# Patient Record
Sex: Male | Born: 1945 | Race: Black or African American | Hispanic: No | Marital: Married | Smoking: Current every day smoker
Health system: Southern US, Community
[De-identification: ages and names within clinical notes are randomized; demographics above are authoritative.]

## PROBLEM LIST (undated history)

## (undated) DIAGNOSIS — N410 Acute prostatitis: Secondary | ICD-10-CM

## (undated) DIAGNOSIS — I429 Cardiomyopathy, unspecified: Secondary | ICD-10-CM

## (undated) DIAGNOSIS — M519 Unspecified thoracic, thoracolumbar and lumbosacral intervertebral disc disorder: Secondary | ICD-10-CM

## (undated) DIAGNOSIS — D126 Benign neoplasm of colon, unspecified: Secondary | ICD-10-CM

## (undated) DIAGNOSIS — R0602 Shortness of breath: Secondary | ICD-10-CM

## (undated) DIAGNOSIS — J449 Chronic obstructive pulmonary disease, unspecified: Secondary | ICD-10-CM

## (undated) DIAGNOSIS — IMO0002 Reserved for concepts with insufficient information to code with codable children: Secondary | ICD-10-CM

## (undated) DIAGNOSIS — Z87448 Personal history of other diseases of urinary system: Secondary | ICD-10-CM

## (undated) DIAGNOSIS — R351 Nocturia: Secondary | ICD-10-CM

## (undated) DIAGNOSIS — J441 Chronic obstructive pulmonary disease with (acute) exacerbation: Secondary | ICD-10-CM

## (undated) DIAGNOSIS — R0609 Other forms of dyspnea: Secondary | ICD-10-CM

## (undated) DIAGNOSIS — R229 Localized swelling, mass and lump, unspecified: Secondary | ICD-10-CM

## (undated) DIAGNOSIS — R0989 Other specified symptoms and signs involving the circulatory and respiratory systems: Secondary | ICD-10-CM

## (undated) DIAGNOSIS — M79609 Pain in unspecified limb: Secondary | ICD-10-CM

## (undated) DIAGNOSIS — J8410 Pulmonary fibrosis, unspecified: Secondary | ICD-10-CM

## (undated) DIAGNOSIS — R5381 Other malaise: Secondary | ICD-10-CM

## (undated) DIAGNOSIS — L989 Disorder of the skin and subcutaneous tissue, unspecified: Secondary | ICD-10-CM

## (undated) DIAGNOSIS — I10 Essential (primary) hypertension: Secondary | ICD-10-CM

## (undated) DIAGNOSIS — K5730 Diverticulosis of large intestine without perforation or abscess without bleeding: Secondary | ICD-10-CM

## (undated) DIAGNOSIS — N400 Enlarged prostate without lower urinary tract symptoms: Secondary | ICD-10-CM

## (undated) DIAGNOSIS — F17200 Nicotine dependence, unspecified, uncomplicated: Secondary | ICD-10-CM

## (undated) DIAGNOSIS — J4489 Other specified chronic obstructive pulmonary disease: Secondary | ICD-10-CM

## (undated) DIAGNOSIS — R252 Cramp and spasm: Secondary | ICD-10-CM

## (undated) DIAGNOSIS — K222 Esophageal obstruction: Secondary | ICD-10-CM

## (undated) DIAGNOSIS — R972 Elevated prostate specific antigen [PSA]: Secondary | ICD-10-CM

## (undated) DIAGNOSIS — R55 Syncope and collapse: Secondary | ICD-10-CM

## (undated) DIAGNOSIS — N453 Epididymo-orchitis: Secondary | ICD-10-CM

## (undated) DIAGNOSIS — R5383 Other fatigue: Secondary | ICD-10-CM

## (undated) DIAGNOSIS — E785 Hyperlipidemia, unspecified: Secondary | ICD-10-CM

## (undated) DIAGNOSIS — R9431 Abnormal electrocardiogram [ECG] [EKG]: Secondary | ICD-10-CM

## (undated) DIAGNOSIS — H538 Other visual disturbances: Secondary | ICD-10-CM

## (undated) DIAGNOSIS — R062 Wheezing: Secondary | ICD-10-CM

## (undated) DIAGNOSIS — D496 Neoplasm of unspecified behavior of brain: Secondary | ICD-10-CM

## (undated) DIAGNOSIS — J309 Allergic rhinitis, unspecified: Secondary | ICD-10-CM

## (undated) DIAGNOSIS — B370 Candidal stomatitis: Secondary | ICD-10-CM

## (undated) DIAGNOSIS — E119 Type 2 diabetes mellitus without complications: Secondary | ICD-10-CM

## (undated) DIAGNOSIS — K219 Gastro-esophageal reflux disease without esophagitis: Secondary | ICD-10-CM

## (undated) HISTORY — DX: Pulmonary fibrosis, unspecified: J84.10

## (undated) HISTORY — DX: Elevated prostate specific antigen (PSA): R97.20

## (undated) HISTORY — DX: Other fatigue: R53.83

## (undated) HISTORY — DX: Essential (primary) hypertension: I10

## (undated) HISTORY — DX: Shortness of breath: R06.02

## (undated) HISTORY — DX: Diverticulosis of large intestine without perforation or abscess without bleeding: K57.30

## (undated) HISTORY — DX: Cramp and spasm: R25.2

## (undated) HISTORY — DX: Benign prostatic hyperplasia without lower urinary tract symptoms: N40.0

## (undated) HISTORY — DX: Reserved for concepts with insufficient information to code with codable children: IMO0002

## (undated) HISTORY — DX: Localized swelling, mass and lump, unspecified: R22.9

## (undated) HISTORY — DX: Disorder of the skin and subcutaneous tissue, unspecified: L98.9

## (undated) HISTORY — DX: Syncope and collapse: R55

## (undated) HISTORY — DX: Unspecified thoracic, thoracolumbar and lumbosacral intervertebral disc disorder: M51.9

## (undated) HISTORY — DX: Hyperlipidemia, unspecified: E78.5

## (undated) HISTORY — DX: Personal history of other diseases of urinary system: Z87.448

## (undated) HISTORY — DX: Allergic rhinitis, unspecified: J30.9

## (undated) HISTORY — DX: Type 2 diabetes mellitus without complications: E11.9

## (undated) HISTORY — DX: Other malaise: R53.81

## (undated) HISTORY — PX: ELBOW ARTHROSCOPY: SHX614

## (undated) HISTORY — DX: Other visual disturbances: H53.8

## (undated) HISTORY — DX: Chronic obstructive pulmonary disease, unspecified: J44.9

## (undated) HISTORY — DX: Benign neoplasm of colon, unspecified: D12.6

## (undated) HISTORY — DX: Other specified chronic obstructive pulmonary disease: J44.89

## (undated) HISTORY — DX: Chronic obstructive pulmonary disease with (acute) exacerbation: J44.1

## (undated) HISTORY — PX: BACK SURGERY: SHX140

## (undated) HISTORY — DX: Acute prostatitis: N41.0

## (undated) HISTORY — DX: Esophageal obstruction: K22.2

## (undated) HISTORY — DX: Wheezing: R06.2

## (undated) HISTORY — DX: Pain in unspecified limb: M79.609

## (undated) HISTORY — DX: Abnormal electrocardiogram (ECG) (EKG): R94.31

## (undated) HISTORY — DX: Other specified symptoms and signs involving the circulatory and respiratory systems: R06.09

## (undated) HISTORY — DX: Other specified symptoms and signs involving the circulatory and respiratory systems: R09.89

## (undated) HISTORY — DX: Epididymo-orchitis: N45.3

## (undated) HISTORY — DX: Neoplasm of unspecified behavior of brain: D49.6

## (undated) HISTORY — DX: Nocturia: R35.1

## (undated) HISTORY — DX: Nicotine dependence, unspecified, uncomplicated: F17.200

## (undated) HISTORY — DX: Gastro-esophageal reflux disease without esophagitis: K21.9

## (undated) HISTORY — DX: Candidal stomatitis: B37.0

## (undated) HISTORY — DX: Cardiomyopathy, unspecified: I42.9

---

## 1999-10-06 ENCOUNTER — Emergency Department (HOSPITAL_COMMUNITY): Admission: EM | Admit: 1999-10-06 | Discharge: 1999-10-06 | Payer: Self-pay | Admitting: Emergency Medicine

## 1999-10-06 ENCOUNTER — Encounter: Payer: Self-pay | Admitting: Emergency Medicine

## 1999-10-08 ENCOUNTER — Encounter: Payer: Self-pay | Admitting: Occupational Medicine

## 1999-10-08 ENCOUNTER — Ambulatory Visit: Admission: RE | Admit: 1999-10-08 | Discharge: 1999-10-08 | Payer: Self-pay | Admitting: Occupational Medicine

## 2001-08-09 ENCOUNTER — Emergency Department (HOSPITAL_COMMUNITY): Admission: EM | Admit: 2001-08-09 | Discharge: 2001-08-09 | Payer: Self-pay | Admitting: *Deleted

## 2002-03-10 ENCOUNTER — Encounter: Admission: RE | Admit: 2002-03-10 | Discharge: 2002-03-10 | Payer: Self-pay | Admitting: *Deleted

## 2002-03-10 ENCOUNTER — Encounter: Payer: Self-pay | Admitting: *Deleted

## 2002-03-24 ENCOUNTER — Encounter: Payer: Self-pay | Admitting: *Deleted

## 2002-03-24 ENCOUNTER — Encounter: Admission: RE | Admit: 2002-03-24 | Discharge: 2002-03-24 | Payer: Self-pay | Admitting: *Deleted

## 2002-04-06 ENCOUNTER — Encounter: Payer: Self-pay | Admitting: *Deleted

## 2002-04-06 ENCOUNTER — Encounter: Admission: RE | Admit: 2002-04-06 | Discharge: 2002-04-06 | Payer: Self-pay | Admitting: *Deleted

## 2002-06-10 ENCOUNTER — Inpatient Hospital Stay (HOSPITAL_COMMUNITY): Admission: RE | Admit: 2002-06-10 | Discharge: 2002-06-11 | Payer: Self-pay | Admitting: Orthopaedic Surgery

## 2004-05-28 ENCOUNTER — Ambulatory Visit (HOSPITAL_COMMUNITY): Admission: RE | Admit: 2004-05-28 | Discharge: 2004-05-28 | Payer: Self-pay | Admitting: Internal Medicine

## 2004-08-30 ENCOUNTER — Ambulatory Visit: Payer: Self-pay | Admitting: *Deleted

## 2004-09-02 ENCOUNTER — Ambulatory Visit: Payer: Self-pay | Admitting: Family Medicine

## 2004-12-03 ENCOUNTER — Ambulatory Visit: Payer: Self-pay | Admitting: Family Medicine

## 2005-04-17 ENCOUNTER — Ambulatory Visit: Payer: Self-pay | Admitting: Internal Medicine

## 2005-04-25 ENCOUNTER — Ambulatory Visit: Admission: RE | Admit: 2005-04-25 | Discharge: 2005-04-25 | Payer: Self-pay | Admitting: Internal Medicine

## 2005-05-06 ENCOUNTER — Ambulatory Visit: Payer: Self-pay | Admitting: Internal Medicine

## 2005-05-28 ENCOUNTER — Ambulatory Visit: Payer: Self-pay | Admitting: Internal Medicine

## 2005-08-12 ENCOUNTER — Ambulatory Visit: Payer: Self-pay | Admitting: Internal Medicine

## 2006-02-25 ENCOUNTER — Ambulatory Visit: Payer: Self-pay | Admitting: Internal Medicine

## 2006-03-18 ENCOUNTER — Ambulatory Visit: Payer: Self-pay | Admitting: Internal Medicine

## 2006-04-01 ENCOUNTER — Ambulatory Visit: Payer: Self-pay | Admitting: Internal Medicine

## 2006-04-01 ENCOUNTER — Ambulatory Visit: Payer: Self-pay

## 2006-04-01 ENCOUNTER — Encounter: Payer: Self-pay | Admitting: Cardiology

## 2006-04-15 ENCOUNTER — Ambulatory Visit: Payer: Self-pay | Admitting: Internal Medicine

## 2006-05-12 ENCOUNTER — Emergency Department (HOSPITAL_COMMUNITY): Admission: EM | Admit: 2006-05-12 | Discharge: 2006-05-12 | Payer: Self-pay | Admitting: Emergency Medicine

## 2006-05-13 ENCOUNTER — Ambulatory Visit: Payer: Self-pay | Admitting: Internal Medicine

## 2006-05-20 ENCOUNTER — Ambulatory Visit: Payer: Self-pay | Admitting: Internal Medicine

## 2006-06-05 ENCOUNTER — Ambulatory Visit: Payer: Self-pay | Admitting: Internal Medicine

## 2006-06-11 ENCOUNTER — Ambulatory Visit: Payer: Self-pay | Admitting: Internal Medicine

## 2006-07-01 ENCOUNTER — Ambulatory Visit (HOSPITAL_COMMUNITY): Admission: RE | Admit: 2006-07-01 | Discharge: 2006-07-01 | Payer: Self-pay | Admitting: Internal Medicine

## 2006-07-06 ENCOUNTER — Ambulatory Visit (HOSPITAL_COMMUNITY): Admission: RE | Admit: 2006-07-06 | Discharge: 2006-07-06 | Payer: Self-pay | Admitting: Internal Medicine

## 2006-07-27 ENCOUNTER — Ambulatory Visit: Payer: Self-pay

## 2006-12-09 ENCOUNTER — Ambulatory Visit: Payer: Self-pay | Admitting: Internal Medicine

## 2007-02-03 ENCOUNTER — Ambulatory Visit: Payer: Self-pay | Admitting: Internal Medicine

## 2007-03-10 ENCOUNTER — Emergency Department (HOSPITAL_COMMUNITY): Admission: EM | Admit: 2007-03-10 | Discharge: 2007-03-10 | Payer: Self-pay | Admitting: Emergency Medicine

## 2007-03-17 ENCOUNTER — Ambulatory Visit: Payer: Self-pay | Admitting: Internal Medicine

## 2007-05-06 ENCOUNTER — Ambulatory Visit: Payer: Self-pay | Admitting: Internal Medicine

## 2007-05-06 LAB — CONVERTED CEMR LAB
Albumin: 3.8 g/dL (ref 3.5–5.2)
Basophils Absolute: 0.2 10*3/uL — ABNORMAL HIGH (ref 0.0–0.1)
Basophils Relative: 1.5 % — ABNORMAL HIGH (ref 0.0–1.0)
Bilirubin, Direct: 0.1 mg/dL (ref 0.0–0.3)
CO2: 30 meq/L (ref 19–32)
Chloride: 100 meq/L (ref 96–112)
Cholesterol: 151 mg/dL (ref 0–200)
Direct LDL: 81.4 mg/dL
Eosinophils Absolute: 0.7 10*3/uL — ABNORMAL HIGH (ref 0.0–0.6)
Glucose, Bld: 131 mg/dL — ABNORMAL HIGH (ref 70–99)
HDL: 42.6 mg/dL (ref >39.0)
MCV: 83.1 fL (ref 78.0–100.0)
Monocytes Relative: 12.9 % — ABNORMAL HIGH (ref 3.0–11.0)
Neutro Abs: 6.3 10*3/uL (ref 1.4–7.7)
Neutrophils Relative %: 60.8 % (ref 43.0–77.0)
PSA: 1.66 ng/mL (ref 0.10–4.00)
RBC: 5.2 M/uL (ref 4.22–5.81)
RDW: 14 % (ref 11.5–14.6)
Total CHOL/HDL Ratio: 3.5
Triglycerides: 208 mg/dL (ref 0–149)
VLDL: 42 mg/dL — ABNORMAL HIGH (ref 0–40)

## 2007-05-12 ENCOUNTER — Ambulatory Visit: Payer: Self-pay | Admitting: Internal Medicine

## 2007-06-09 ENCOUNTER — Ambulatory Visit: Payer: Self-pay | Admitting: Internal Medicine

## 2007-06-22 ENCOUNTER — Ambulatory Visit: Payer: Self-pay | Admitting: Internal Medicine

## 2007-06-22 LAB — CONVERTED CEMR LAB
BUN: 11 mg/dL (ref 6–23)
CO2: 29 meq/L (ref 19–32)
Calcium: 9 mg/dL (ref 8.4–10.5)
Chloride: 109 meq/L (ref 96–112)
Creatinine, Ser: 1.1 mg/dL (ref 0.4–1.5)
GFR calc Af Amer: 87 mL/min
GFR calc non Af Amer: 72 mL/min
Glucose, Bld: 94 mg/dL (ref 70–99)
Hgb A1c MFr Bld: 6.7 % — ABNORMAL HIGH (ref 4.6–6.0)
Sodium: 141 meq/L (ref 135–145)

## 2007-07-13 ENCOUNTER — Ambulatory Visit: Payer: Self-pay | Admitting: Pulmonary Disease

## 2007-07-15 ENCOUNTER — Ambulatory Visit: Payer: Self-pay | Admitting: Internal Medicine

## 2007-08-16 ENCOUNTER — Ambulatory Visit: Payer: Self-pay | Admitting: Internal Medicine

## 2007-08-19 ENCOUNTER — Ambulatory Visit: Payer: Self-pay | Admitting: Internal Medicine

## 2007-08-23 LAB — HM COLONOSCOPY

## 2007-08-31 ENCOUNTER — Encounter: Payer: Self-pay | Admitting: Internal Medicine

## 2007-08-31 ENCOUNTER — Ambulatory Visit (HOSPITAL_COMMUNITY): Admission: RE | Admit: 2007-08-31 | Discharge: 2007-08-31 | Payer: Self-pay | Admitting: Internal Medicine

## 2007-08-31 DIAGNOSIS — D126 Benign neoplasm of colon, unspecified: Secondary | ICD-10-CM | POA: Insufficient documentation

## 2007-08-31 DIAGNOSIS — K5730 Diverticulosis of large intestine without perforation or abscess without bleeding: Secondary | ICD-10-CM | POA: Insufficient documentation

## 2007-09-07 ENCOUNTER — Ambulatory Visit: Payer: Self-pay | Admitting: Internal Medicine

## 2007-09-28 ENCOUNTER — Ambulatory Visit: Payer: Self-pay | Admitting: Internal Medicine

## 2007-09-28 ENCOUNTER — Encounter: Payer: Self-pay | Admitting: Internal Medicine

## 2007-09-28 DIAGNOSIS — R5383 Other fatigue: Secondary | ICD-10-CM

## 2007-09-28 DIAGNOSIS — K219 Gastro-esophageal reflux disease without esophagitis: Secondary | ICD-10-CM

## 2007-09-28 DIAGNOSIS — K222 Esophageal obstruction: Secondary | ICD-10-CM

## 2007-09-28 DIAGNOSIS — R0609 Other forms of dyspnea: Secondary | ICD-10-CM

## 2007-09-28 DIAGNOSIS — J309 Allergic rhinitis, unspecified: Secondary | ICD-10-CM | POA: Insufficient documentation

## 2007-09-28 DIAGNOSIS — M519 Unspecified thoracic, thoracolumbar and lumbosacral intervertebral disc disorder: Secondary | ICD-10-CM | POA: Insufficient documentation

## 2007-09-28 DIAGNOSIS — R0989 Other specified symptoms and signs involving the circulatory and respiratory systems: Secondary | ICD-10-CM

## 2007-09-28 DIAGNOSIS — R5381 Other malaise: Secondary | ICD-10-CM | POA: Insufficient documentation

## 2007-09-28 DIAGNOSIS — E785 Hyperlipidemia, unspecified: Secondary | ICD-10-CM

## 2007-09-28 DIAGNOSIS — Z87448 Personal history of other diseases of urinary system: Secondary | ICD-10-CM

## 2007-09-28 DIAGNOSIS — I10 Essential (primary) hypertension: Secondary | ICD-10-CM | POA: Insufficient documentation

## 2007-09-28 DIAGNOSIS — J449 Chronic obstructive pulmonary disease, unspecified: Secondary | ICD-10-CM

## 2007-10-08 ENCOUNTER — Encounter: Payer: Self-pay | Admitting: Internal Medicine

## 2007-10-08 ENCOUNTER — Ambulatory Visit: Payer: Self-pay | Admitting: Internal Medicine

## 2007-10-28 ENCOUNTER — Ambulatory Visit: Payer: Self-pay | Admitting: Internal Medicine

## 2007-11-22 ENCOUNTER — Ambulatory Visit: Payer: Self-pay | Admitting: Internal Medicine

## 2007-11-23 LAB — CONVERTED CEMR LAB
Calcium: 9.5 mg/dL (ref 8.4–10.5)
Chloride: 99 meq/L (ref 96–112)
Cholesterol: 179 mg/dL (ref 0–200)
Creatinine, Ser: 1.3 mg/dL (ref 0.4–1.5)
Glucose, Bld: 90 mg/dL (ref 70–99)
Hgb A1c MFr Bld: 6.3 % — ABNORMAL HIGH (ref 4.6–6.0)
Sodium: 139 meq/L (ref 135–145)
VLDL: 31 mg/dL (ref 0–40)

## 2007-12-11 DIAGNOSIS — J8410 Pulmonary fibrosis, unspecified: Secondary | ICD-10-CM

## 2008-01-19 ENCOUNTER — Ambulatory Visit: Payer: Self-pay | Admitting: Internal Medicine

## 2008-01-19 DIAGNOSIS — L989 Disorder of the skin and subcutaneous tissue, unspecified: Secondary | ICD-10-CM | POA: Insufficient documentation

## 2008-03-15 ENCOUNTER — Ambulatory Visit: Payer: Self-pay | Admitting: Internal Medicine

## 2008-04-18 ENCOUNTER — Ambulatory Visit: Payer: Self-pay | Admitting: Internal Medicine

## 2008-04-18 DIAGNOSIS — J441 Chronic obstructive pulmonary disease with (acute) exacerbation: Secondary | ICD-10-CM

## 2008-04-18 DIAGNOSIS — B370 Candidal stomatitis: Secondary | ICD-10-CM

## 2008-04-19 LAB — CONVERTED CEMR LAB
ALT: 12 units/L (ref 0–53)
Albumin: 3.5 g/dL (ref 3.5–5.2)
BUN: 11 mg/dL (ref 6–23)
Basophils Absolute: 0 10*3/uL (ref 0.0–0.1)
CO2: 27 meq/L (ref 19–32)
Calcium: 9.1 mg/dL (ref 8.4–10.5)
Chloride: 106 meq/L (ref 96–112)
Creatinine, Ser: 1 mg/dL (ref 0.4–1.5)
Creatinine,U: 172.5 mg/dL
Eosinophils Absolute: 1.4 10*3/uL — ABNORMAL HIGH (ref 0.0–0.7)
Eosinophils Relative: 16.5 % — ABNORMAL HIGH (ref 0.0–5.0)
GFR calc Af Amer: 97 mL/min
HCT: 45.2 % (ref 39.0–52.0)
LDL Cholesterol: 85 mg/dL (ref 0–99)
MCHC: 32.1 g/dL (ref 30.0–36.0)
MCV: 83.9 fL (ref 78.0–100.0)
Monocytes Relative: 8.9 % (ref 3.0–12.0)
Platelets: 290 10*3/uL (ref 150–400)
Potassium: 4.1 meq/L (ref 3.5–5.1)
Sodium: 140 meq/L (ref 135–145)
Total CHOL/HDL Ratio: 2.9
Total Protein: 7.3 g/dL (ref 6.0–8.3)
WBC: 8.5 10*3/uL (ref 4.5–10.5)

## 2008-06-13 ENCOUNTER — Telehealth (INDEPENDENT_AMBULATORY_CARE_PROVIDER_SITE_OTHER): Payer: Self-pay | Admitting: *Deleted

## 2008-07-11 ENCOUNTER — Ambulatory Visit: Payer: Self-pay | Admitting: Internal Medicine

## 2008-09-05 ENCOUNTER — Telehealth (INDEPENDENT_AMBULATORY_CARE_PROVIDER_SITE_OTHER): Payer: Self-pay | Admitting: *Deleted

## 2008-10-09 ENCOUNTER — Ambulatory Visit: Payer: Self-pay | Admitting: Internal Medicine

## 2008-10-09 DIAGNOSIS — R351 Nocturia: Secondary | ICD-10-CM

## 2008-10-09 DIAGNOSIS — N401 Enlarged prostate with lower urinary tract symptoms: Secondary | ICD-10-CM

## 2008-10-09 LAB — CONVERTED CEMR LAB
Calcium: 9.5 mg/dL (ref 8.4–10.5)
Chloride: 99 meq/L (ref 96–112)
Cholesterol: 167 mg/dL (ref 0–200)
Crystals: NEGATIVE
GFR calc non Af Amer: 65 mL/min
Glucose, Bld: 106 mg/dL — ABNORMAL HIGH (ref 70–99)
HDL: 72.8 mg/dL (ref >39.0)
LDL Cholesterol: 73 mg/dL (ref 0–99)
Mucus, UA: NEGATIVE
Nitrite: NEGATIVE
RBC / HPF: NONE SEEN
Specific Gravity, Urine: 1.02 (ref 1.000–1.03)
Triglycerides: 104 mg/dL (ref 0–149)
VLDL: 21 mg/dL (ref 0–40)
pH: 5 (ref 5.0–8.0)

## 2008-11-06 ENCOUNTER — Ambulatory Visit: Payer: Self-pay | Admitting: Internal Medicine

## 2008-11-06 DIAGNOSIS — R229 Localized swelling, mass and lump, unspecified: Secondary | ICD-10-CM

## 2009-01-29 ENCOUNTER — Ambulatory Visit: Payer: Self-pay | Admitting: Internal Medicine

## 2009-01-29 DIAGNOSIS — M79609 Pain in unspecified limb: Secondary | ICD-10-CM

## 2009-06-20 ENCOUNTER — Ambulatory Visit: Payer: Self-pay | Admitting: Internal Medicine

## 2009-06-20 LAB — CONVERTED CEMR LAB
Albumin: 3.5 g/dL (ref 3.5–5.2)
BUN: 10 mg/dL (ref 6–23)
Basophils Absolute: 0 10*3/uL (ref 0.0–0.1)
CO2: 29 meq/L (ref 19–32)
Calcium: 8.9 mg/dL (ref 8.4–10.5)
Chloride: 106 meq/L (ref 96–112)
Cholesterol: 147 mg/dL (ref 0–200)
Eosinophils Relative: 2.6 % (ref 0.0–5.0)
Folate: 7 ng/mL
HCT: 44.4 % (ref 39.0–52.0)
HDL: 58.9 mg/dL (ref >39.00)
Hemoglobin, Urine: NEGATIVE
Hemoglobin: 15.3 g/dL (ref 13.0–17.0)
Hgb A1c MFr Bld: 6.3 % (ref 4.6–6.5)
LDL Cholesterol: 67 mg/dL (ref 0–99)
Lymphocytes Relative: 12.4 % (ref 12.0–46.0)
Lymphs Abs: 1 10*3/uL (ref 0.7–4.0)
MCHC: 34.4 g/dL (ref 30.0–36.0)
Monocytes Absolute: 0.6 10*3/uL (ref 0.1–1.0)
PSA: 1.1 ng/mL (ref 0.10–4.00)
Platelets: 235 10*3/uL (ref 150.0–400.0)
Potassium: 4.1 meq/L (ref 3.5–5.1)
RDW: 13.8 % (ref 11.5–14.6)
Sed Rate: 9 mm/hr (ref 0–22)
Sodium: 142 meq/L (ref 135–145)
Specific Gravity, Urine: >=1.03 (ref 1.000–1.030)
Total Protein, Urine: NEGATIVE mg/dL
Total Protein: 6.6 g/dL (ref 6.0–8.3)
Urine Glucose: NEGATIVE mg/dL
VLDL: 21.2 mg/dL (ref 0.0–40.0)
Vitamin B-12: 313 pg/mL (ref 211–911)
pH: 5.5 (ref 5.0–8.0)

## 2009-08-24 ENCOUNTER — Ambulatory Visit: Payer: Self-pay | Admitting: Internal Medicine

## 2009-08-24 DIAGNOSIS — R9431 Abnormal electrocardiogram [ECG] [EKG]: Secondary | ICD-10-CM

## 2009-10-23 ENCOUNTER — Ambulatory Visit: Payer: Self-pay | Admitting: Internal Medicine

## 2009-10-24 LAB — CONVERTED CEMR LAB
Calcium: 9.1 mg/dL (ref 8.4–10.5)
Cholesterol: 160 mg/dL (ref 0–200)
GFR calc non Af Amer: 86.2 mL/min (ref >60)
Sodium: 139 meq/L (ref 135–145)
Total CHOL/HDL Ratio: 4
Triglycerides: 113 mg/dL (ref 0.0–149.0)
VLDL: 22.6 mg/dL (ref 0.0–40.0)

## 2009-10-25 ENCOUNTER — Encounter: Payer: Self-pay | Admitting: Internal Medicine

## 2009-10-29 ENCOUNTER — Ambulatory Visit: Payer: Self-pay | Admitting: Cardiology

## 2009-10-29 ENCOUNTER — Ambulatory Visit: Payer: Self-pay

## 2009-10-29 ENCOUNTER — Ambulatory Visit (HOSPITAL_COMMUNITY): Admission: RE | Admit: 2009-10-29 | Discharge: 2009-10-29 | Payer: Self-pay | Admitting: Internal Medicine

## 2009-10-29 ENCOUNTER — Encounter: Payer: Self-pay | Admitting: Internal Medicine

## 2009-11-01 ENCOUNTER — Telehealth: Payer: Self-pay | Admitting: Internal Medicine

## 2009-11-02 ENCOUNTER — Encounter (INDEPENDENT_AMBULATORY_CARE_PROVIDER_SITE_OTHER): Payer: Self-pay | Admitting: *Deleted

## 2009-11-05 ENCOUNTER — Ambulatory Visit: Payer: Self-pay | Admitting: Internal Medicine

## 2009-11-05 ENCOUNTER — Encounter (INDEPENDENT_AMBULATORY_CARE_PROVIDER_SITE_OTHER): Payer: Self-pay | Admitting: *Deleted

## 2009-11-06 ENCOUNTER — Ambulatory Visit: Payer: Self-pay | Admitting: Internal Medicine

## 2009-11-09 ENCOUNTER — Telehealth: Payer: Self-pay | Admitting: Internal Medicine

## 2009-11-12 ENCOUNTER — Encounter: Payer: Self-pay | Admitting: Internal Medicine

## 2009-11-16 ENCOUNTER — Telehealth: Payer: Self-pay | Admitting: Internal Medicine

## 2009-11-23 ENCOUNTER — Ambulatory Visit: Payer: Self-pay | Admitting: Internal Medicine

## 2010-02-14 ENCOUNTER — Ambulatory Visit: Payer: Self-pay | Admitting: Internal Medicine

## 2010-02-14 DIAGNOSIS — R062 Wheezing: Secondary | ICD-10-CM

## 2010-02-15 ENCOUNTER — Telehealth: Payer: Self-pay | Admitting: Internal Medicine

## 2010-03-04 ENCOUNTER — Telehealth: Payer: Self-pay | Admitting: Internal Medicine

## 2010-04-01 ENCOUNTER — Ambulatory Visit: Payer: Self-pay | Admitting: Internal Medicine

## 2010-04-01 DIAGNOSIS — N453 Epididymo-orchitis: Secondary | ICD-10-CM | POA: Insufficient documentation

## 2010-04-01 DIAGNOSIS — N410 Acute prostatitis: Secondary | ICD-10-CM

## 2010-04-01 LAB — CONVERTED CEMR LAB
CO2: 29 meq/L (ref 19–32)
Chloride: 98 meq/L (ref 96–112)
GFR calc non Af Amer: 60.19 mL/min (ref >60)
Glucose, Bld: 130 mg/dL — ABNORMAL HIGH (ref 70–99)
Hgb A1c MFr Bld: 6.7 % — ABNORMAL HIGH (ref 4.6–6.5)
Ketones, ur: NEGATIVE mg/dL
Potassium: 3.4 meq/L — ABNORMAL LOW (ref 3.5–5.1)
Sodium: 138 meq/L (ref 135–145)
Specific Gravity, Urine: >=1.03 (ref 1.000–1.030)
pH: 5.5 (ref 5.0–8.0)

## 2010-04-30 ENCOUNTER — Telehealth: Payer: Self-pay | Admitting: Internal Medicine

## 2010-06-25 ENCOUNTER — Telehealth: Payer: Self-pay | Admitting: Internal Medicine

## 2010-06-26 ENCOUNTER — Ambulatory Visit: Payer: Self-pay | Admitting: Internal Medicine

## 2010-06-26 DIAGNOSIS — R972 Elevated prostate specific antigen [PSA]: Secondary | ICD-10-CM | POA: Insufficient documentation

## 2010-06-27 LAB — CONVERTED CEMR LAB
ALT: 14 units/L (ref 0–53)
AST: 17 units/L (ref 0–37)
Alkaline Phosphatase: 51 units/L (ref 39–117)
BUN: 19 mg/dL (ref 6–23)
Basophils Absolute: 0 10*3/uL (ref 0.0–0.1)
Basophils Relative: 0.4 % (ref 0.0–3.0)
Bilirubin, Direct: 0.1 mg/dL (ref 0.0–0.3)
CO2: 28 meq/L (ref 19–32)
Cholesterol: 188 mg/dL (ref 0–200)
Creatinine, Ser: 1.1 mg/dL (ref 0.4–1.5)
Creatinine,U: 248.9 mg/dL
Glucose, Bld: 96 mg/dL (ref 70–99)
HDL: 70.5 mg/dL (ref >39.00)
Hemoglobin, Urine: NEGATIVE
Leukocytes, UA: NEGATIVE
Lymphocytes Relative: 15.9 % (ref 12.0–46.0)
MCHC: 33.2 g/dL (ref 30.0–36.0)
MCV: 88.2 fL (ref 78.0–100.0)
Microalb, Ur: 1 mg/dL (ref 0.0–1.9)
Monocytes Absolute: 1.2 10*3/uL — ABNORMAL HIGH (ref 0.1–1.0)
Neutrophils Relative %: 69.6 % (ref 43.0–77.0)
Nitrite: NEGATIVE
PSA: 3.33 ng/mL (ref 0.10–4.00)
Potassium: 4.4 meq/L (ref 3.5–5.1)
RBC: 5.49 M/uL (ref 4.22–5.81)
Sodium: 140 meq/L (ref 135–145)
TSH: 0.42 microintl units/mL (ref 0.35–5.50)
Testosterone: 388.1 ng/dL (ref 350.00–890.00)
Total Bilirubin: 0.3 mg/dL (ref 0.3–1.2)
Total CHOL/HDL Ratio: 3
Total Protein: 7.7 g/dL (ref 6.0–8.3)
Triglycerides: 158 mg/dL — ABNORMAL HIGH (ref 0.0–149.0)
Urine Glucose: NEGATIVE mg/dL
VLDL: 31.6 mg/dL (ref 0.0–40.0)
Vit D, 25-Hydroxy: 29 ng/mL — ABNORMAL LOW (ref 30–89)
Vitamin B-12: 406 pg/mL (ref 211–911)

## 2010-07-11 ENCOUNTER — Encounter: Payer: Self-pay | Admitting: Internal Medicine

## 2010-07-25 ENCOUNTER — Encounter: Payer: Self-pay | Admitting: Internal Medicine

## 2010-08-20 ENCOUNTER — Ambulatory Visit: Payer: Self-pay | Admitting: Internal Medicine

## 2010-08-20 ENCOUNTER — Encounter: Payer: Self-pay | Admitting: Internal Medicine

## 2010-08-20 DIAGNOSIS — R0602 Shortness of breath: Secondary | ICD-10-CM

## 2010-08-22 ENCOUNTER — Telehealth (INDEPENDENT_AMBULATORY_CARE_PROVIDER_SITE_OTHER): Payer: Self-pay | Admitting: *Deleted

## 2010-08-30 ENCOUNTER — Ambulatory Visit: Payer: Self-pay | Admitting: Internal Medicine

## 2010-08-30 DIAGNOSIS — F17200 Nicotine dependence, unspecified, uncomplicated: Secondary | ICD-10-CM | POA: Insufficient documentation

## 2010-09-05 ENCOUNTER — Encounter: Payer: Self-pay | Admitting: Internal Medicine

## 2010-09-05 ENCOUNTER — Ambulatory Visit: Payer: Self-pay

## 2010-09-05 ENCOUNTER — Ambulatory Visit (HOSPITAL_COMMUNITY): Admission: RE | Admit: 2010-09-05 | Discharge: 2010-09-05 | Payer: Self-pay | Admitting: Internal Medicine

## 2010-09-05 ENCOUNTER — Ambulatory Visit: Payer: Self-pay | Admitting: Internal Medicine

## 2010-10-10 ENCOUNTER — Ambulatory Visit: Payer: Self-pay | Admitting: Internal Medicine

## 2010-10-15 ENCOUNTER — Ambulatory Visit: Payer: Self-pay | Admitting: Internal Medicine

## 2010-10-15 DIAGNOSIS — H538 Other visual disturbances: Secondary | ICD-10-CM | POA: Insufficient documentation

## 2010-10-15 DIAGNOSIS — R252 Cramp and spasm: Secondary | ICD-10-CM

## 2010-11-21 ENCOUNTER — Encounter: Payer: Self-pay | Admitting: Internal Medicine

## 2011-01-16 ENCOUNTER — Ambulatory Visit
Admission: RE | Admit: 2011-01-16 | Discharge: 2011-01-16 | Payer: Self-pay | Source: Home / Self Care | Attending: Internal Medicine | Admitting: Internal Medicine

## 2011-01-16 ENCOUNTER — Other Ambulatory Visit: Payer: Self-pay | Admitting: Internal Medicine

## 2011-01-16 DIAGNOSIS — R55 Syncope and collapse: Secondary | ICD-10-CM | POA: Insufficient documentation

## 2011-01-16 LAB — LIPID PANEL
Cholesterol: 195 mg/dL (ref 0–200)
HDL: 45.5 mg/dL (ref >39.00)
Total CHOL/HDL Ratio: 4

## 2011-01-16 LAB — CBC WITH DIFFERENTIAL/PLATELET
Hemoglobin: 14.7 g/dL (ref 13.0–17.0)
Lymphocytes Relative: 10.3 % — ABNORMAL LOW (ref 12.0–46.0)
MCV: 86.8 fl (ref 78.0–100.0)
Neutro Abs: 7.8 10*3/uL — ABNORMAL HIGH (ref 1.4–7.7)
RBC: 5.07 Mil/uL (ref 4.22–5.81)
WBC: 10.5 10*3/uL (ref 4.5–10.5)

## 2011-01-16 LAB — BASIC METABOLIC PANEL
Calcium: 9.3 mg/dL (ref 8.4–10.5)
Creatinine, Ser: 1.3 mg/dL (ref 0.4–1.5)
GFR: 72.1 mL/min (ref >60.00)
Glucose, Bld: 118 mg/dL — ABNORMAL HIGH (ref 70–99)

## 2011-01-16 LAB — HEPATIC FUNCTION PANEL
ALT: 12 U/L (ref 0–53)
Alkaline Phosphatase: 57 U/L (ref 39–117)
Bilirubin, Direct: 0 mg/dL (ref 0.0–0.3)
Total Bilirubin: 0.4 mg/dL (ref 0.3–1.2)
Total Protein: 6.8 g/dL (ref 6.0–8.3)

## 2011-01-16 LAB — MICROALBUMIN / CREATININE URINE RATIO: Microalb, Ur: 2.1 mg/dL — ABNORMAL HIGH (ref 0.0–1.9)

## 2011-01-16 LAB — LDL CHOLESTEROL, DIRECT: Direct LDL: 116 mg/dL

## 2011-01-21 ENCOUNTER — Telehealth: Payer: Self-pay | Admitting: Internal Medicine

## 2011-01-21 NOTE — Consult Note (Signed)
Summary: [facility]   Imported By: Sherian Rein 07/31/2010 15:03:57  _____________________________________________________________________  External Attachment:    Type:   Image     Comment:   External Document

## 2011-01-21 NOTE — Assessment & Plan Note (Signed)
Summary: HAND CRAMPS  STC   Vital Signs:  Patient profile:   65 year old male Height:      76 inches Weight:      223.13 pounds BMI:     27.26 O2 Sat:      96 % on Room air Temp:     98.3 degrees F oral Pulse rate:   98 / minute BP sitting:   112 / 72  (left arm) Cuff size:   large  Vitals Entered By: Zella Ball Ewing CMA (AAMA) (October 15, 2010 1:27 PM)  O2 Flow:  Room air CC: leg, toe, feet and hand cramps, Flu Shot/RE   Primary Care Provider:  [name], MD  CC:  leg, toe, feet and hand cramps, and Flu Shot/RE.  History of Present Illness: here with acute - c/o multiple areas of cramps mostly at night but some cramps during the day - to hands, bilat post thigh, and feet/toes;  working more lately with hands - part time job at [employer] - with more use of the hand and LE''s;  thinks he doesnt drink enough fluids so that might be a factor;  ran out of metformin for 4 days about 3 mo ago, felt bad in the line at [employer]; actually had a minute episode of lightheaded, nausea, weakness , and actually fell after he got out of line but no injury or syncope or recurrence. .Pt denies other CP, orthopnea, pnd, worsening LE edema, palps, other dizziness or syncope, except has has mild onset wheezing for 2 days without fever or prod cough  Pt denies new neuro symptoms such as headache, facial or extremity weakness  No fever, wt loss, night sweats, loss of appetite or other constitutional symptoms . No dizziness or falls since he re-started the metformin.  Pt denies polydipsia, polyuria  Overall good compliance with meds, trying to follow low chol, DM diet, wt stable, little excercise however  Also recent thrush to tongue with recent dentures.    Problems Prior to Update: 1)  Blurred Vision  (ICD-368.8) 2)  Thrush  (ICD-112.0) 3)  Muscle Cramps  (ICD-729.82) 4)  Smoker  (ICD-305.1) 5)  Dyspnea  (ICD-786.05) 6)  Psa, Increased  (ICD-790.93) 7)  Epididymo-orchitis  (ICD-604.90) 8)  Acute  Prostatitis  (ICD-601.0) 9)  Preventive Health Care  (ICD-V70.0) 10)  Wheezing  (ICD-786.07) 11)  Thumb Pain, Left  (ICD-729.5) 12)  Abnormal Electrocardiogram  (ICD-794.31) 13)  Fatigue  (ICD-780.79) 14)  Hand Pain, Left  (ICD-729.5) 15)  Thrush  (ICD-112.0) 16)  Mass, Superficial  (ICD-782.2) 17)  Benign Prostatic Hypertrophy  (ICD-600.00) 18)  Nocturia  (ICD-788.43) 19)  Chronic Obstructive Pulmonary Disease, Acute Exacerbation  (ICD-491.21) 20)  Special Screening Malignant Neoplasm of Prostate  (ICD-V76.44) 21)  Fatigue  (ICD-780.79) 22)  Thrush  (ICD-112.0) 23)  Chronic Obstructive Pulmonary Disease, Acute Exacerbation  (ICD-491.21) 24)  Diverticular Disease  (ICD-562.10) 25)  Adenomatous Colonic Polyp  (ICD-211.3) 26)  Skin Lesion  (ICD-709.9) 27)  Pulmonary Fibrosis  (ICD-515) 28)  Dyspnea/shortness of Breath  (ICD-786.09) 29)  Fatigue  (ICD-780.79) 30)  Family History Diabetes 1st Degree Relative  (ICD-V18.0) 31)  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 Take  One 30-60 Min Before First Meal of The Day 2)  Ecotrin Low Strength 81 Mg  Tbec (Aspirin) .Marland Kitchen.. 1 By Mouth Each Am 3)  Metformin Hcl 500 Mg Tabs (Metformin Hcl) .... Take 1 Tablet By Mouth Twice A Day 4)  Clonidine Hcl 0.1 Mg  Tabs (Clonidine Hcl) .... One Half Twice Daily 5)  Lovastatin 20 Mg Tabs (Lovastatin) .... Take 2 Tablet By Mouth Once A Day in Evening 6)  Hydrochlorothiazide 25 Mg  Tabs (Hydrochlorothiazide) .... Take 1 Tablet By Mouth Once A Day 7)  Prednisone 10 Mg  Tabs (Prednisone) .... 1/2 By Mouth Once Daily 8)  Tamsulosin Hcl 0.4 Mg Caps (Tamsulosin  Hcl) .Marland Kitchen.. 1po Once Daily 9)  Symbicort 160-4.5 Mcg/act Aero (Budesonide-Formoterol Fumarate) .... 2 Puffs Two Times A Day  Current Medications (verified): 1)  Omeprazole 20 Mg Tbec (Omeprazole) .... Take  One 30-60 Min Before First Meal of The Day 2)  Ecotrin Low Strength 81 Mg  Tbec (Aspirin) .Marland Kitchen.. 1 By Mouth Each Am 3)  Metformin Hcl 500 Mg Tabs (Metformin Hcl) .... Take 1 Tablet By Mouth Twice A Day 4)  Clonidine Hcl 0.1 Mg  Tabs (Clonidine Hcl) .... One Half Twice Daily 5)  Lipitor 20 Mg Tabs (Atorvastatin Calcium) .Marland Kitchen.. 1 By Mouth Once Daily  - Generic 6)  Hydrochlorothiazide 25 Mg  Tabs (Hydrochlorothiazide) .... Take 1 Tablet By Mouth Once A Day 7)  Prednisone 10 Mg  Tabs (Prednisone) .... 1/2 By Mouth Once Daily 8)  Tamsulosin Hcl 0.4 Mg Caps (Tamsulosin Hcl) .Marland Kitchen.. 1po Once Daily 9)  Symbicort 160-4.5 Mcg/act Aero (Budesonide-Formoterol Fumarate) .... 2 Puffs Two Times A Day 10)  Nystatin 100000 Unit/ml Susp (Nystatin) .... Use Asd 5 Cc Swish and Spit Four Times Per Day For 10 Days 11)  Levitra 20 Mg Tabs (Vardenafil Hcl) .Marland Kitchen.. 1po Once Daily  Allergies (verified): 1)  ! Lisinopril (Lisinopril) 2)  ! Ace Inhibitors  Past History:  Past Medical History: Last updated: 10/10/2010 COPD............................................Marland KitchenWert   -  2009  FEV1 baseline of 1.8  >  course of prednisone with an FEV1 up to 2.45 with a ratio of 57%    - ? 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 Allergic rhinitis Benign prostatic hypertrophy  Past Surgical History: Last updated: 11/21/2009 back surgery x 4 - disabled after 2003  Social History: Last updated: 11/23/2009 Current Smoker Alcohol use-yes Patient does not get regular exercise.  Occupation: Disabled Daily Caffeine Use Illicit Drug Use -  no  Risk Factors: Exercise: no (11/21/2009)  Risk Factors: Smoking Status: current (10/10/2010) Packs/Day: 0.5 (08/30/2010)  Review of Systems       all otherwise negative per pt -  except for occasional blurry vision for longer distance  Physical Exam  General:  alert and overweight-appearing.   Head:  normocephalic and atraumatic.   Eyes:  vision grossly intact, pupils equal, and pupils round.   Ears:  R ear normal and L ear normal.   Nose:  no external deformity and no nasal discharge.   Mouth:  no gingival abnormalities and pharynx pink and moist.  , tongue with thrush like rash Neck:  supple and no masses.   Lungs:  normal  respiratory effort, R decreased breath sounds, and L decreased breath sounds.   Heart:  normal rate and regular rhythm.   Msk:  no joint tenderness and no joint swelling, no muscle tender or cramp.   Extremities:  no edema, no erythema    Impression & Recommendations:  Problem # 1:  MUSCLE CRAMPS (ICD-729.82) mild, unclear etiology, for b complex vitamin, avoid dehydration, d/c the lovastatin, declines labs today  Problem # 2:  HYPERLIPIDEMIA (ICD-272.4)  His updated medication list for this problem includes:    Lipitor 20 Mg Tabs (Atorvastatin calcium) .Marland Kitchen... 1 by mouth once daily  - generic treat as above, f/u any worsening signs or symptoms - to change statin as above  Labs Reviewed: SGOT: 17 (06/26/2010)   SGPT: 14 (06/26/2010)   HDL:70.50 (06/26/2010), 48.90 (04/01/2010)  LDL:86 (06/26/2010), 95 (04/01/2010)  Chol:188 (06/26/2010), 174 (04/01/2010)  Trig:158.0 (06/26/2010), 152.0 (04/01/2010)  Problem # 3:  DIABETES MELLITUS, TYPE II (ICD-250.00)  His updated medication list for this problem includes:    Ecotrin Low Strength 81 Mg Tbec (Aspirin) .Marland Kitchen... 1 by mouth each am    Metformin Hcl 500 Mg Tabs (Metformin hcl) .Marland Kitchen... Take 1 tablet by mouth twice a day  Orders: Ophthalmology Referral (Ophthalmology)  Labs Reviewed: Creat: 1.1  (06/26/2010)    Reviewed HgBA1c results: 6.5 (06/26/2010)  6.7 (04/01/2010) stable overall by hx and exam, ok to continue meds/tx as is   Problem # 4:  HYPERTENSION (ICD-401.9)  His updated medication list for this problem includes:    Clonidine Hcl 0.1 Mg Tabs (Clonidine hcl) ..... One half twice daily    Hydrochlorothiazide 25 Mg Tabs (Hydrochlorothiazide) .Marland Kitchen... Take 1 tablet by mouth once a day  BP today: 112/72 Prior BP: 108/78 (10/10/2010)  Labs Reviewed: K+: 4.4 (06/26/2010) Creat: : 1.1 (06/26/2010)   Chol: 188 (06/26/2010)   HDL: 70.50 (06/26/2010)   LDL: 86 (06/26/2010)   TG: 158.0 (06/26/2010) stable overall by hx and exam, ok to continue meds/tx as is   Problem # 5:  THRUSH (ICD-112.0) tx with oral nystatin , to rinse mouth after symbicort   Problem # 6:  BLURRED VISION (ICD-368.8) Assessment: Unchanged  with hx of DM - refer optho - pt reqeusts tues,wed, thur  Orders: Ophthalmology Referral (Ophthalmology)  Problem # 7:  CHRONIC OBSTRUCTIVE PULMONARY DISEASE, ACUTE EXACERBATION (ICD-491.21)  very mild - for depoIM today,  and Continue all previous medications as before this visit  - declines inhaler  Orders: Depo- Medrol 40mg  (J1030) Depo- Medrol 80mg  (J1040) Admin of Therapeutic Inj  intramuscular or subcutaneous (85462)  Problem # 8:  ERECTILE DYSFUNCTION, ORGANIC, 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 Take  one 30-60 min before first meal of the day 2)  Ecotrin Low Strength 81 Mg Tbec (Aspirin) .Marland Kitchen.. 1 by mouth each am 3)  Metformin Hcl 500 Mg Tabs (Metformin hcl) .... Take 1 tablet by mouth twice a day 4)  Clonidine Hcl 0.1 Mg Tabs (Clonidine hcl) .... One half twice daily 5)  Lipitor 20 Mg Tabs (Atorvastatin calcium) .Marland Kitchen.. 1 by mouth once daily  - generic 6)  Hydrochlorothiazide 25 Mg Tabs (Hydrochlorothiazide) .... Take 1 tablet by mouth once a day 7)  Prednisone 10 Mg Tabs (Prednisone) .... 1/2 by  mouth once daily 8)  Tamsulosin Hcl 0.4 Mg Caps (Tamsulosin hcl) .Marland Kitchen.. 1po once daily 9)  Symbicort 160-4.5 Mcg/act Aero (Budesonide-formoterol fumarate) .... 2 puffs two times a day 10)  Nystatin  100000 Unit/ml Susp (Nystatin) .... Use asd 5 cc swish and spit four times per day for 10 days 11)  Levitra 20 Mg Tabs (Vardenafil hcl) .Marland Kitchen.. 1po once daily  Other Orders: Flu Vaccine 33yrs + MEDICARE PATIENTS (Y7829) Administration Flu vaccine - MCR (F6213)  Patient Instructions: 1)  ok to stop the lovastatin 2)  start the lipiitor generic in 4 wks 3)  you had the flu shot today 4)  Please take all new medications as prescribed - the nystatin for thrush (already sent to the pharmacy) 5)  you had the steroid shot today 6)  You will be contacted about the referral(s) to: opthomology 7)  Please schedule a follow-up appointment in 3 months for blood work check Prescriptions: LEVITRA 20 MG TABS (VARDENAFIL HCL) 1po once daily  #5 x 11   Entered and Authorized by:   Corwin Levins MD   Signed by:   Corwin Levins MD on 10/15/2010   Method used:   Print then Give to Patient   RxID:   [id number] LIPITOR 20 MG TABS (ATORVASTATIN CALCIUM) 1 by mouth once daily  - generic  #90 x 3   Entered and Authorized by:   Corwin Levins MD   Signed by:   Corwin Levins MD on 10/15/2010   Method used:   Print then Give to Patient   RxID:   [id number] NYSTATIN 100000 UNIT/ML SUSP (NYSTATIN) use asd 5 cc swish and spit four times per day for 10 days  #1bottle x 1   Entered and Authorized by:   Corwin Levins MD   Signed by:   Corwin Levins MD on 10/15/2010   Method used:   Electronically to        [facility] [address] [phone]* (retail)       [address]       [place], [place]  [address]       Ph: [phone]       Fax: [phone]   RxID:   [id number]    Medication Administration  Injection # 1:    Medication: Depo- Medrol 40mg     Diagnosis: CHRONIC OBSTRUCTIVE PULMONARY DISEASE, ACUTE EXACERBATION  (ICD-491.21)    Route: IM    Site: RUOQ gluteus    Exp Date: 03/2013    Lot #: [id number]    Mfr: [employer]    Comments: Patient received 120mg  Depo-Medrol    Patient tolerated injection without complications    Given by: Zella Ball Ewing CMA Duncan Dull) (October 15, 2010 2:15 PM)  Injection # 2:    Medication: Depo- Medrol 80mg     Diagnosis: CHRONIC OBSTRUCTIVE PULMONARY DISEASE, ACUTE EXACERBATION (ICD-491.21)    Route: IM    Site: RUOQ gluteus    Exp Date: 03/2013    Lot #: [id number]    Mfr: [employer]    Given by: Zella Ball Ewing CMA Duncan Dull) (October 15, 2010 2:15 PM)  Orders Added: 1)  Flu Vaccine 23yrs + MEDICARE PATIENTS [Q2039] 2)  Administration Flu vaccine - MCR [G0008] 3)  Depo- Medrol 40mg  [J1030] 4)  Depo- Medrol 80mg  [J1040] 5)  Admin of Therapeutic Inj  intramuscular or subcutaneous [96372] 6)  Ophthalmology Referral [Ophthalmology] 7)  Est. Patient Level IV [32202]   Flu Vaccine Consent Questions     Do you have a history of severe allergic reactions to this vaccine? no    Any prior history of allergic reactions to egg and/or gelatin? no    Do you have a sensitivity to  the preservative Thimersol? no    Do you have a past history of Guillan-Barre Syndrome? no    Do you currently have an acute febrile illness? no    Have you ever had a severe reaction to latex? no    Vaccine information given and explained to patient? yes    Are you currently pregnant? no    Lot Number:[id number]   Exp Date:06/21/2011   Site Given  Left Deltoid [id number]

## 2011-01-21 NOTE — Progress Notes (Signed)
----   Converted from flag ---- ---- 08/21/2010 3:57 PM, Edman Circle wrote: appt 9/15 @ 3:00  ---- 08/21/2010 2:40 PM, Dagoberto Reef wrote: Thanks  ---- 08/20/2010 12:25 PM, Corwin Levins MD wrote: The following orders have been entered for this patient and placed on Admin Hold:  Type:     Referral       Code:   Echo Description:   Echo Referral Order Date:   08/20/2010   Authorized By:   Corwin Levins MD Order #:   [id number] Clinical Notes:   Type:  Special instructions:  Type:     Referral       Code:   Misc. Ref Description:   Misc. Referral Order Date:   08/20/2010   Authorized By:   Corwin Levins MD Order #:   [id number] Clinical Notes:   Type of Referral: PFT's Reason:  Type:     Referral       Code:   Pulmonary Description:   Pulmonary Referral Order Date:   08/20/2010   Authorized By:   Corwin Levins MD Order #:   [id number] Clinical Notes:   dr wert ------------------------------

## 2011-01-21 NOTE — Progress Notes (Signed)
Summary: OTC med?  Phone Note Call from Patient Call back at [facility] Phone [phone]   Caller: Patient Summary of Call: pt called requesting MD suggest an OTC supplement he can take to increase energy that will be safe to use with current Rx medications. please advise  Follow-up for Phone Call        unfortunately there really isnt anything like that;  the best thing is to get regular excericse, eat right , keep the wt down and treat any problems one might have like HTN or DM or other illness Follow-up by: Corwin Levins MD,  Apr 30, 2010 3:11 PM  Additional Follow-up for Phone Call Additional follow up Details #1::        pt informed Additional Follow-up by: Margaret Pyle, CMA,  May 01, 2010 8:08 AM

## 2011-01-21 NOTE — Assessment & Plan Note (Signed)
Summary: Pulmonary/ final summary f/u ov, minimally abn pft's   Copy to:  Dr. [name] Primary Provider/Referring Provider:  [name], Richard Hopkins  CC:  DOE- the same.  History of Present Illness: 33  wobm still smoking with gradually  worse  doe  x since early 2000's  03/15/2008 Pulmonary eval, rec stop smoking, return if more sob or cough  August 30, 2010 Pulmonary consult for COPD...pt c/o fatigue for over 6 months...denies any sob says fatigue better when taking prednisone, worse when off it.  Prednisone rx per Dr [name] "for breathing" has become a maintenance rx -wiithout it he has no energy  Sleeps ok resp - wise, no am cough or congestion.  can't afford consistent symbicort only the 4 dollar [employer] plan.  taking spiriva as needed.  on a good day can walk behind his mower self propelled for 30 min.  no obvious reason when his days are really bad,  energy >> sob limiting on bad days. rec master hfa technique, stop using spiriva since only using as a as needed anyway, and return for pft's on symbicort as maint rx.  Taper pred per Dr [name] instructions  October 10, 2010 ov  still on prednisone 5mg  daily,  cc DOE- the same = admits it's  more energy limited than doe. no sign cough. Pt denies any significant sore throat, dysphagia, itching, sneezing,  nasal congestion or excess secretions,  fever, chills, sweats, unintended wt loss, pleuritic or exertional cp, hempoptysis, change in activity tolerance  orthopnea pnd or leg swelling. Pt also denies any obvious fluctuation in symptoms with weather or environmental change or other alleviating or aggravating factors.          Preventive Screening-Counseling & Management  Alcohol-Tobacco     Smoking Status: current     Smoking Cessation Counseling: yes  Current Medications (verified): 1)  Omeprazole 20 Mg Tbec (Omeprazole) .... Take  One 30-60 Min Before First Meal of The Day 2)  Ecotrin Low Strength 81 Mg  Tbec (Aspirin) .Marland Kitchen.. 1 By Mouth Each  Am 3)  Metformin Hcl 500 Mg Tabs (Metformin Hcl) .... Take 1 Tablet By Mouth Twice A Day 4)  Clonidine Hcl 0.1 Mg  Tabs (Clonidine Hcl) .... Take 1 Tablet By Mouth Twice A Day 5)  Lovastatin 20 Mg Tabs (Lovastatin) .... Take 2 Tablet By Mouth Once A Day in Evening 6)  Hydrochlorothiazide 25 Mg  Tabs (Hydrochlorothiazide) .... Take 1 Tablet By Mouth Once A Day 7)  Prednisone 10 Mg  Tabs (Prednisone) .... 1/2 By Mouth Once Daily 8)  Tamsulosin Hcl 0.4 Mg Caps (Tamsulosin Hcl) .Marland Kitchen.. 1po Once Daily 9)  Symbicort 160-4.5 Mcg/act Aero (Budesonide-Formoterol Fumarate) .... 2 Puffs Two Times A Day  Allergies (verified): 1)  ! Lisinopril (Lisinopril) 2)  ! Ace Inhibitors  Past History:  Past Medical History: COPD............................................Marland KitchenWert   -  2009  FEV1 baseline of 1.8  >  course of prednisone with an FEV1 up to 2.45 with a ratio of 57%    - ? 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 Allergic rhinitis Benign prostatic hypertrophy  Vital Signs:  Patient profile:   65 year old male Weight:      224 pounds O2 Sat:  98 % on Room air Temp:     98.2 degrees F oral Pulse rate:   86 / minute BP sitting:   108 / 78  (left arm)  Vitals Entered By: [name] (October 10, 2010 4:25 PM)  O2 Flow:  Room air  Physical Exam  Additional Exam:  in general is a  somber elderly black male in no acute distress with somewhat of a hopeless, helpless affect/ attitude  wt 223 August 30, 2010 > 224 October 12, 2010  .HEENT mild turbinate edema.  Oropharynx no thrush or excess pnd or cobblestoning.  No JVD or cervical adenopathy. Mild accessory muscle hypertrophy. Trachea midline, nl thryroid. Chest was hyperinflated by percussion with diminished breath sounds and moderate increased exp  time with MID expiratory bilateral  wheeze. Hoover sign positive at mid inspiration. Regular rate and rhythm without murmur gallop or rub or increase P2.  Abd: no hsm, nl excursion. Ext warm without cyanosis clubbing or edema.       Impression & Recommendations:  Problem # 1:  COPD (ICD-496) GOLD I  still smoking though now with adquate hfa and fev1 well over 2 lpm does not really appear to be limiting him - therefore should be able to taper off prednisone completely and use the symbiocort either 2bid or, as in [place] if cost is an issue, 2 every 12 hours when breathing is bothering him  Problem # 2:  SMOKER (ICD-305.1)  I took this opportunity to educate the patient regarding the consequences of smoking in airway disorders based on all the data we have from the multiple national lung health studies indicating that smoking cessation, not choice of inhalers or physicians, is the most important aspect of care.      I emphasized that although we never turn away smokers from the pulmonary clinic, we do ask that they understand that the recommendations that were made won't work nearly as well in the presence of continued cigarette exposure and we may reach a point where we can't help the patient if he/she can't quit smoking.    Orders: Est. Patient Level IV (47829)  Problem # 3:  HYPERTENSION (ICD-401.9)  His updated medication list for this problem includes:    Clonidine Hcl 0.1 Mg Tabs (Clonidine hcl) ..... One half twice daily    Hydrochlorothiazide 25 Mg Tabs (Hydrochlorothiazide) .Marland Kitchen... Take 1 tablet by mouth once a day  Fatigue may be related to relatively low bp or to effects of clonidine, try one half dose two times a day until seen by Dr [name]  Orders: Est. Patient Level IV (56213)  Problem # 4:  PULMONARY FIBROSIS (ICD-515) cxr from 08/20/10 reviewed.  He may have very mild PF but the number one concern in smokers is RBILD vs DIP, both directly linked to smoking and no w/u needs to  be considered at this point.   DLCO is normal.  If there is convincing progression of dyspnea or desaturation with exercise first step is stop smoking then consider HRCT if not improving and pulmonary f/u prn  Medications Added to Medication List This Visit: 1)  Clonidine Hcl 0.1 Mg Tabs (Clonidine hcl) .... One half twice daily 2)  Symbicort 160-4.5 Mcg/act Aero (Budesonide-formoterol fumarate) .... 2 puffs two times a day  Patient Instructions: 1)  ok to use symbicort 2 puffs every 12 hours if you feel it helps  2)  stop smoking before it stops you 3)  Try reduce clonidine to one half twice daily  to see if energy level improves 4)  pulmonary clinic follow up is as needed

## 2011-01-21 NOTE — Assessment & Plan Note (Signed)
Summary: fu---med---stc   Vital Signs:  Patient profile:   65 year old male Height:      76 inches Weight:      217.50 pounds BMI:     26.57 O2 Sat:      93 % on Room air Temp:     98.2 degrees F oral Pulse rate:   81 / minute BP sitting:   110 / 72  (left arm) Cuff size:   large  Vitals Entered By: Zella Ball Ewing CMA Duncan Dull) (June 26, 2010 3:13 PM)  O2 Flow:  Room air CC: followup/RE   Primary Care Provider:  [name], MD  CC:  followup/RE.  History of Present Illness: here with low energy, sob and doe for 3 to 4 days worse (actually milder prior to that);  no fever but incr prod cough (not sure of color) as well;  not using the symbicort "all the time"  due to cost;  can only walk currently about 50 ft before has to rest usually with sitting;  Pt denies CP, orthopnea, pnd, worsening LE edema, palps, dizziness or syncope .  Pt denies new neuro symptoms such as headache, facial or extremity weakness Pt denies polydipsia, polyuria, or low sugar symptoms such as shakiness improved with eating.  Overall good compliance with meds, trying to follow low chol, DM diet, wt stable, little excercise however   also requests "LOW T" check due to low energy/fatigue, no worsening depressive symptoms or OSA symtpoms   Problems Prior to Update: 1)  Psa, Increased  (ICD-790.93) 2)  Bronchitis-acute  (ICD-466.0) 3)  Epididymo-orchitis  (ICD-604.90) 4)  Acute Prostatitis  (ICD-601.0) 5)  Preventive Health Care  (ICD-V70.0) 6)  Wheezing  (ICD-786.07) 7)  Thumb Pain, Left  (ICD-729.5) 8)  Abnormal Electrocardiogram  (ICD-794.31) 9)  Fatigue  (ICD-780.79) 10)  Hand Pain, Left  (ICD-729.5) 11)  Thrush  (ICD-112.0) 12)  Mass, Superficial  (ICD-782.2) 13)  Benign Prostatic Hypertrophy  (ICD-600.00) 14)  Nocturia  (ICD-788.43) 15)  Chronic Obstructive Pulmonary Disease, Acute Exacerbation  (ICD-491.21) 16)  Special Screening Malignant Neoplasm of Prostate  (ICD-V76.44) 17)  Fatigue   (ICD-780.79) 18)  Thrush  (ICD-112.0) 19)  Chronic Obstructive Pulmonary Disease, Acute Exacerbation  (ICD-491.21) 20)  Diverticular Disease  (ICD-562.10) 21)  Adenomatous Colonic Polyp  (ICD-211.3) 22)  Skin Lesion  (ICD-709.9) 23)  Pulmonary Fibrosis  (ICD-515) 24)  Dyspnea/shortness of Breath  (ICD-786.09) 25)  Fatigue  (ICD-780.79) 26)  Family History Diabetes 1st Degree Relative  (ICD-V18.0) 27)  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 Inhale 2 Puff Using Inhaler Twice A Day 2)  Omeprazole 20 Mg Tbec (Omeprazole) .... 2 By Mouth Once Daily 3)  Ecotrin Low Strength 81 Mg  Tbec (Aspirin) .Marland Kitchen.. 1 By Mouth Each Am 4)  Metformin Hcl 500 Mg Tabs (Metformin Hcl) .... Take 1 Tablet By Mouth Twice A Day 5)  Clonidine Hcl 0.1 Mg  Tabs (Clonidine Hcl) .... Take 1 Tablet By Mouth Twice A Day 6)  Lovastatin 20 Mg Tabs (Lovastatin) .... Take 2 Tablet By Mouth Once A Day in Evening 7)  Hydrochlorothiazide 25 Mg  Tabs (  Hydrochlorothiazide) .... Take 1 Tablet By Mouth Once A Day 8)  Prednisone 10 Mg  Tabs (Prednisone) .... 1/2 By Mouth Once Daily 9)  Spiriva Handihaler 18 Mcg Caps (Tiotropium Bromide Monohydrate) .Marland Kitchen.. 1 Puff Once Daily 10)  Meloxicam 15 Mg Tabs (Meloxicam) .Marland Kitchen.. 1 By Mouth Once Daily As Needed Pain 11)  Carafate 1 Gm/15ml Susp (Sucralfate) .... Take 10 Cc. P.o. B.i.d. 12)  Colace 100 Mg  Caps (Docusate Sodium) .... Once Daily 13)  Bentyl 20 Mg  Tabs (Dicyclomine Hcl) .... By Mouth Three Times A Day 14)  Tessalon Perles 100 Mg Caps (Benzonatate) .Marland Kitchen.. 1 - 2 By Mouth Three Times A Day As Needed 15)   Ciprofloxacin Hcl 500 Mg Tabs (Ciprofloxacin Hcl) .Marland Kitchen.. 1po Two Times A Day 16)  Tamsulosin Hcl 0.4 Mg Caps (Tamsulosin Hcl) .Marland Kitchen.. 1po Once Daily  Current Medications (verified): 1)  Symbicort 160-4.5 Mcg/act Aero (Budesonide-Formoterol Fumarate) .... Inhale 2 Puff Using Inhaler Twice A Day 2)  Omeprazole 20 Mg Tbec (Omeprazole) .... 2 By Mouth Once Daily 3)  Ecotrin Low Strength 81 Mg  Tbec (Aspirin) .Marland Kitchen.. 1 By Mouth Each Am 4)  Metformin Hcl 500 Mg Tabs (Metformin Hcl) .... Take 1 Tablet By Mouth Twice A Day 5)  Clonidine Hcl 0.1 Mg  Tabs (Clonidine Hcl) .... Take 1 Tablet By Mouth Twice A Day 6)  Lovastatin 20 Mg Tabs (Lovastatin) .... Take 2 Tablet By Mouth Once A Day in Evening 7)  Hydrochlorothiazide 25 Mg  Tabs (Hydrochlorothiazide) .... Take 1 Tablet By Mouth Once A Day 8)  Prednisone 10 Mg  Tabs (Prednisone) .... 1/2 By Mouth Once Daily 9)  Spiriva Handihaler 18 Mcg Caps (Tiotropium Bromide Monohydrate) .Marland Kitchen.. 1 Puff Once Daily 10)  Tessalon Perles 100 Mg Caps (Benzonatate) .Marland Kitchen.. 1 - 2 By Mouth Three Times A Day As Needed For Cough 11)  Tamsulosin Hcl 0.4 Mg Caps (Tamsulosin Hcl) .Marland Kitchen.. 1po Once Daily 12)  Azithromycin 250 Mg Tabs (Azithromycin) .... 2po Qd For 1 Day, Then 1po Qd For 4days, Then Stop 13)  Prednisone 10 Mg Tabs (Prednisone) .... 4po Qd For 3days, Then 3po Qd For 3days, Then 2po Qd For 3days, Then 1po Qd For 3 Days, Then Stop  Allergies (verified): 1)  ! Lisinopril (Lisinopril) 2)  ! Ace Inhibitors  Past History:  Past Medical History: Last updated: 10/09/2008 COPD Diabetes mellitus, type II Hyperlipidemia Hypertension GERD esoph stricture e.d. Allergic rhinitis Benign prostatic hypertrophy  Past Surgical History: Last updated: 11/21/2009 back surgery x 4 - disabled after 2003  Social History: Last updated: 11/23/2009 Current Smoker Alcohol use-yes Patient does not get regular exercise.  Occupation: Disabled Daily Caffeine Use Illicit Drug Use -  no  Risk Factors: Exercise: no (11/21/2009)  Risk Factors: Smoking Status: current (09/28/2007)  Review of Systems       all otherwise negative per pt -    Physical Exam  General:  alert and overweight-appearing.   Head:  normocephalic and atraumatic.   Eyes:  vision grossly intact, pupils equal, and pupils round.   Ears:  R ear normal and L ear normal.   Nose:  no external deformity and no nasal discharge.   Mouth:  pharyngeal erythema and fair dentition.   Neck:  supple and no masses.   Lungs:  normal respiratory effort, R decreased breath sounds, R wheezes, L decreased breath sounds, and L wheezes.   Heart:  normal rate and regular rhythm.   Abdomen:  soft, non-tender, and normal bowel sounds.  Msk:  no joint tenderness and no joint swelling.   Extremities:  no edema, no erythema  Neurologic:  cranial nerves II-XII intact and strength normal in all extremities.   Skin:  color normal and no rashes.   Psych:  normally interactive and slightly anxious.     Impression & Recommendations:  Problem # 1:  BRONCHITIS-ACUTE (ICD-466.0)  The following medications were removed from the medication list:    Ciprofloxacin Hcl 500 Mg Tabs (Ciprofloxacin hcl) .Marland Kitchen... 1po two times a day His updated medication list for this problem includes:    Symbicort 160-4.5 Mcg/act Aero (Budesonide-formoterol fumarate) ..... Inhale 2 puff using inhaler twice a day    Spiriva Handihaler 18 Mcg Caps (Tiotropium bromide monohydrate) .Marland Kitchen... 1 puff once daily    Tessalon Perles 100 Mg Caps (Benzonatate) .Marland Kitchen... 1 - 2 by mouth three times a day as needed for cough    Azithromycin 250 Mg Tabs (Azithromycin) .Marland Kitchen... 2po qd for 1 day, then 1po qd for 4days, then stop treat as above, f/u any worsening signs or symptoms  Orders: Depo- Medrol 40mg  (J1030) Depo- Medrol 80mg  (J1040) Admin of Therapeutic Inj  intramuscular or subcutaneous (93810)  Problem # 2:  CHRONIC OBSTRUCTIVE PULMONARY DISEASE, ACUTE  EXACERBATION (ICD-491.21) for depo IM today, and prednisone pack  Problem # 3:  FATIGUE (ICD-780.79) exam benign, to check labs below; follow with expectant management  Orders: T-Vitamin D (25-Hydroxy) (17510-25852) TLB-BMP (Basic Metabolic Panel-BMET) (80048-METABOL) TLB-CBC Platelet - w/Differential (85025-CBCD) TLB-Hepatic/Liver Function Pnl (80076-HEPATIC) TLB-TSH (Thyroid Stimulating Hormone) (84443-TSH) TLB-Sedimentation Rate (ESR) (85652-ESR) TLB-IBC Pnl (Iron/FE;Transferrin) (83550-IBC) TLB-B12 + Folate Pnl (82746_82607-B12/FOL) TLB-Udip ONLY (81003-UDIP)  Problem # 4:  DIABETES MELLITUS, TYPE II (ICD-250.00)  His updated medication list for this problem includes:    Ecotrin Low Strength 81 Mg Tbec (Aspirin) .Marland Kitchen... 1 by mouth each am    Metformin Hcl 500 Mg Tabs (Metformin hcl) .Marland Kitchen... Take 1 tablet by mouth twice a day  Orders: TLB-Lipid Panel (80061-LIPID) TLB-Microalbumin/Creat Ratio, Urine (82043-MALB) TLB-A1C / Hgb A1C (Glycohemoglobin) (83036-A1C)  Labs Reviewed: Creat: 1.5 (04/01/2010)    Reviewed HgBA1c results: 6.7 (04/01/2010)  6.3 (10/23/2009) stable overall by hx and exam, ok to continue meds/tx as is   Complete Medication List: 1)  Symbicort 160-4.5 Mcg/act Aero (Budesonide-formoterol fumarate) .... Inhale 2 puff using inhaler twice a day 2)  Omeprazole 20 Mg Tbec (Omeprazole) .... 2 by mouth once daily 3)  Ecotrin Low Strength 81 Mg Tbec (Aspirin) .Marland Kitchen.. 1 by mouth each am 4)  Metformin Hcl 500 Mg Tabs (Metformin hcl) .... Take 1 tablet by mouth twice a day 5)  Clonidine Hcl 0.1 Mg Tabs (Clonidine hcl) .... Take 1 tablet by mouth twice a day 6)  Lovastatin 20 Mg Tabs (Lovastatin) .... Take 2 tablet by mouth once a day in evening 7)  Hydrochlorothiazide 25 Mg Tabs (Hydrochlorothiazide) .... Take 1 tablet by mouth once a day 8)  Prednisone 10 Mg Tabs (Prednisone) .... 1/2 by mouth once daily 9)  Spiriva Handihaler 18 Mcg Caps (Tiotropium bromide monohydrate)  .Marland Kitchen.. 1 puff once daily 10)  Tessalon Perles 100 Mg Caps (Benzonatate) .Marland Kitchen.. 1 - 2 by mouth three times a day as needed for cough 11)  Tamsulosin Hcl 0.4 Mg Caps (Tamsulosin hcl) .Marland Kitchen.. 1po once daily 12)  Azithromycin 250 Mg Tabs (Azithromycin) .... 2po qd for 1 day, then 1po qd for 4days, then stop 13)  Prednisone 10 Mg Tabs (Prednisone) .... 4po qd for 3days, then 3po qd for 3days, then  2po qd for 3days, then 1po qd for 3 days, then stop  Other Orders: TLB-PSA (Prostate Specific Antigen) (84153-PSA)  Patient Instructions: 1)  you are given the steroid shot today 2)  you are given the symbicort 160 samples 3)  Please take all new medications as prescribed - the antibiotic, and prednisone 4)  Continue all previous medications as before this visit  5)  Please go to the Lab in the basement for your blood and/or urine tests today 6)  Please schedule a follow-up appointment in 6 months (you can cancel any other appts until then), or sooner if needed Prescriptions: PREDNISONE 10 MG TABS (PREDNISONE) 4po qd for 3days, then 3po qd for 3days, then 2po qd for 3days, then 1po qd for 3 days, then stop  #30 x 0   Entered and Authorized by:   Corwin Levins MD   Signed by:   Corwin Levins MD on 06/26/2010   Method used:   Print then Give to Patient   RxID:   [id number] AZITHROMYCIN 250 MG TABS (AZITHROMYCIN) 2po qd for 1 day, then 1po qd for 4days, then stop  #6 x 1   Entered and Authorized by:   Corwin Levins MD   Signed by:   Corwin Levins MD on 06/26/2010   Method used:   Print then Give to Patient   RxID:   [id number] TESSALON PERLES 100 MG CAPS (BENZONATATE) 1 - 2 by mouth three times a day as needed for cough  #60 x 1   Entered and Authorized by:   Corwin Levins MD   Signed by:   Corwin Levins MD on 06/26/2010   Method used:   Print then Give to Patient   RxID:   [id number]    Medication Administration  Injection # 1:    Medication: Depo- Medrol 40mg     Diagnosis:  BRONCHITIS-ACUTE (ICD-466.0)    Route: IM    Site: RUOQ gluteus    Exp Date: 03/2013    Lot #: [id number]    Mfr: [employer]    Given by: Zella Ball Ewing CMA Duncan Dull) (June 26, 2010 4:23 PM)  Injection # 2:    Medication: Depo- Medrol 80mg     Diagnosis: BRONCHITIS-ACUTE (ICD-466.0)    Route: IM    Site: RUOQ gluteus    Exp Date: 03/2013    Lot #: [id number]    Mfr: [employer]    Given by: Zella Ball Ewing CMA Duncan Dull) (June 26, 2010 4:23 PM)  Orders Added: 1)  T-Vitamin D (25-Hydroxy) [phone] 2)  Depo- Medrol 40mg  [J1030] 3)  Depo- Medrol 80mg  [J1040] 4)  Admin of Therapeutic Inj  intramuscular or subcutaneous [96372] 5)  TLB-BMP (Basic Metabolic Panel-BMET) [80048-METABOL] 6)  TLB-CBC Platelet - w/Differential [85025-CBCD] 7)  TLB-Hepatic/Liver Function Pnl [80076-HEPATIC] 8)  TLB-TSH (Thyroid Stimulating Hormone) [84443-TSH] 9)  TLB-Sedimentation Rate (ESR) [85652-ESR] 10)  TLB-IBC Pnl (Iron/FE;Transferrin) [83550-IBC] 11)  TLB-B12 + Folate Pnl [82746_82607-B12/FOL] 12)  TLB-Udip ONLY [81003-UDIP] 13)  TLB-Lipid Panel [80061-LIPID] 14)  TLB-Microalbumin/Creat Ratio, Urine [82043-MALB] 15)  TLB-A1C / Hgb A1C (Glycohemoglobin) [83036-A1C] 16)  TLB-PSA (Prostate Specific Antigen) [84153-PSA] 17)  Est. Patient Level IV [54627]

## 2011-01-21 NOTE — Assessment & Plan Note (Signed)
Summary: FLU?? / PROSTATE PROBLEM /NWS   Vital Signs:  Patient profile:   65 year old male Height:      75 inches Weight:      218.13 pounds BMI:     27.36 O2 Sat:      97 % on Room air Temp:     98 degrees F oral Pulse rate:   103 / minute BP sitting:   110 / 68  (left arm) Cuff size:   large  Vitals Entered ByZella Ball Ewing (April 01, 2010 2:38 PM)  O2 Flow:  Room air CC: left testicle swollen, chills, no appetite/RE   Primary Care Provider:  [name], MD  CC:  left testicle swollen, chills, and no appetite/RE.  History of Present Illness: here with feeling of heat, and chills and mild dizzy with standing acutely for 3 days; as well as testicle swelling and left groin pain - similar to episode 8 yrs ago;  had blood in the seme  with intercourse using the condom a wk ago ;  no penile d/c or ulcers;  has some increased difficulty urinating as well, mild even though he stopped the flomax some time ago;  no back pain or other abd pain; n/v, or bowel habit change.  Pt denies polydipsia, polyuria, or low sugar symptoms such as shakiness improved with eating.  Overall good compliance with meds, trying to follow low chol, DM diet, wt stable, little excercise however   Problems Prior to Update: 1)  Epididymo-orchitis  (ICD-604.90) 2)  Acute Prostatitis  (ICD-601.0) 3)  Preventive Health Care  (ICD-V70.0) 4)  Wheezing  (ICD-786.07) 5)  Thumb Pain, Left  (ICD-729.5) 6)  Abnormal Electrocardiogram  (ICD-794.31) 7)  Fatigue  (ICD-780.79) 8)  Hand Pain, Left  (ICD-729.5) 9)  Thrush  (ICD-112.0) 10)  Mass, Superficial  (ICD-782.2) 11)  Benign Prostatic Hypertrophy  (ICD-600.00) 12)  Nocturia  (ICD-788.43) 13)  Chronic Obstructive Pulmonary Disease, Acute Exacerbation  (ICD-491.21) 14)  Special Screening Malignant Neoplasm of Prostate  (ICD-V76.44) 15)  Fatigue  (ICD-780.79) 16)  Thrush  (ICD-112.0) 17)  Chronic Obstructive Pulmonary Disease, Acute Exacerbation  (ICD-491.21) 18)   Diverticular Disease  (ICD-562.10) 19)  Adenomatous Colonic Polyp  (ICD-211.3) 20)  Skin Lesion  (ICD-709.9) 21)  Pulmonary Fibrosis  (ICD-515) 22)  Dyspnea/shortness of Breath  (ICD-786.09) 23)  Fatigue  (ICD-780.79) 24)  Family History Diabetes 1st Degree Relative  (ICD-V18.0) 25)  Lumbar Disc Disorder  (ICD-722.93) 26)  Allergic Rhinitis  (ICD-477.9) 27)  Erectile Dysfunction, Organic, 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 Inhale 2 Puff Using Inhaler Twice A Day 2)  Omeprazole 20 Mg Tbec (Omeprazole) .... 2 By Mouth Once Daily 3)  Ecotrin Low Strength 81 Mg  Tbec (Aspirin) .Marland Kitchen.. 1 By Mouth Each Am 4)  Metformin Hcl 500 Mg Tabs (Metformin Hcl) .... Take 1 Tablet By Mouth Twice A Day 5)  Clonidine Hcl 0.1 Mg  Tabs (Clonidine Hcl) .... Take 1 Tablet By Mouth Twice A Day 6)  Lovastatin 20 Mg Tabs (Lovastatin) .... Take 2 Tablet By Mouth Once A Day in Evening 7)  Hydrochlorothiazide 25 Mg  Tabs (Hydrochlorothiazide) .... Take 1 Tablet By Mouth Once A Day 8)  Prednisone 10 Mg  Tabs (Prednisone) .... 1/2 By Mouth Once Daily 9)  Prednisone 10 Mg Tabs (Prednisone) .... 3po Qd For 3days, Then 2po Qd For 3days, Then 1po Qd For 3days, Then Stop 10)  Spiriva Handihaler 18 Mcg Caps (Tiotropium Bromide Monohydrate) .Marland Kitchen.. 1 Puff Once Daily 11)  Meloxicam 15 Mg Tabs (Meloxicam) .Marland Kitchen.. 1 By Mouth Once Daily As Needed Pain 12)  Carafate 1 Gm/18ml Susp (Sucralfate) .... Take 10 Cc. P.o. B.i.d. 13)  Colace 100 Mg  Caps (Docusate Sodium) .... Once Daily 14)  Bentyl 20 Mg  Tabs (Dicyclomine Hcl) .... By Mouth Three Times A Day 15)  Azithromycin 250 Mg Tabs (Azithromycin) .... 2po Qd For 1 Day, Then 1po Qd For 4days, Then Stop 16)   Prednisone 10 Mg Tabs (Prednisone) .... 4po Qd For 3days, Then 3po Qd For 3days, Then 2po Qd For 3days, Then 1po Qd For 3 Days, Then Stop 17)  Tessalon Perles 100 Mg Caps (Benzonatate) .Marland Kitchen.. 1 - 2 By Mouth Three Times A Day As Needed  Current Medications (verified): 1)  Symbicort 160-4.5 Mcg/act Aero (Budesonide-Formoterol Fumarate) .... Inhale 2 Puff Using Inhaler Twice A Day 2)  Omeprazole 20 Mg Tbec (Omeprazole) .... 2 By Mouth Once Daily 3)  Ecotrin Low Strength 81 Mg  Tbec (Aspirin) .Marland Kitchen.. 1 By Mouth Each Am 4)  Metformin Hcl 500 Mg Tabs (Metformin Hcl) .... Take 1 Tablet By Mouth Twice A Day 5)  Clonidine Hcl 0.1 Mg  Tabs (Clonidine Hcl) .... Take 1 Tablet By Mouth Twice A Day 6)  Lovastatin 20 Mg Tabs (Lovastatin) .... Take 2 Tablet By Mouth Once A Day in Evening 7)  Hydrochlorothiazide 25 Mg  Tabs (Hydrochlorothiazide) .... Take 1 Tablet By Mouth Once A Day 8)  Prednisone 10 Mg  Tabs (Prednisone) .... 1/2 By Mouth Once Daily 9)  Spiriva Handihaler 18 Mcg Caps (Tiotropium Bromide Monohydrate) .Marland Kitchen.. 1 Puff Once Daily 10)  Meloxicam 15 Mg Tabs (Meloxicam) .Marland Kitchen.. 1 By Mouth Once Daily As Needed Pain 11)  Carafate 1 Gm/69ml Susp (Sucralfate) .... Take 10 Cc. P.o. B.i.d. 12)  Colace 100 Mg  Caps (Docusate Sodium) .... Once Daily 13)  Bentyl 20 Mg  Tabs (Dicyclomine Hcl) .... By Mouth Three Times A Day 14)  Tessalon Perles 100 Mg Caps (Benzonatate) .Marland Kitchen.. 1 - 2 By Mouth Three Times A Day As Needed 15)  Ciprofloxacin Hcl 500 Mg Tabs (Ciprofloxacin Hcl) .Marland Kitchen.. 1po Two Times A Day 16)  Tamsulosin Hcl 0.4 Mg Caps (Tamsulosin Hcl) .Marland Kitchen.. 1po Once Daily  Allergies (verified): 1)  ! Lisinopril (Lisinopril) 2)  ! Ace Inhibitors  Past History:  Past Medical History: Last updated: 10/09/2008 COPD Diabetes mellitus, type II Hyperlipidemia Hypertension GERD esoph stricture e.d. Allergic rhinitis Benign prostatic hypertrophy  Past Surgical History: Last updated: 11/21/2009 back surgery x 4 -  disabled after 2003  Social History: Last updated: 11/23/2009 Current Smoker Alcohol use-yes Patient does not get regular exercise.  Occupation: Disabled Daily Caffeine Use Illicit Drug Use - no  Risk Factors: Exercise: no (11/21/2009)  Risk Factors: Smoking Status: current (09/28/2007)  Review of Systems       all otherwise negative per pt -    Physical Exam  General:  alert and overweight-appearing. , mild ill  Head:  normocephalic and atraumatic.   Eyes:  vision grossly intact, pupils equal, and pupils round.   Ears:  R ear normal and L ear normal.   Nose:  no external deformity and no nasal discharge.   Mouth:  no gingival abnormalities and pharynx pink and moist.   Neck:  supple and no masses.   Lungs:  normal respiratory effort and normal breath sounds.   Heart:  normal rate and regular rhythm.   Abdomen:  soft and normal bowel sounds.  , and mild low mid abd tender without guarding or rebound Rectal:  prostate mild tender, heme neg Genitalia:  marked swollen tender left testicle without scrotal edema or erythema or fluctuance, right testicl normal appearance;  no hernias inguinal, penis normal appearance uncircumcised Msk:  no joint tenderness and no joint swelling.   Extremities:  no edema, no erythema    Impression & Recommendations:  Problem # 1:  ACUTE PROSTATITIS (ICD-601.0)  for cipro x 3 wks, after urine studies  Orders: T-Culture, Urine ([address]-60454) Rocephin  250mg  (U9811) Admin of Therapeutic Inj  intramuscular or subcutaneous (91478) TLB-Udip w/ Micro (81001-URINE)  Problem # 2:  EPIDIDYMO-ORCHITIS (ICD-604.90) severe left side - as above, also for rocephpin IM today  Problem # 3:  BENIGN PROSTATIC HYPERTROPHY (ICD-600.00)  His updated medication list for this problem includes:    Clonidine Hcl 0.1 Mg Tabs (Clonidine hcl) .Marland Kitchen... Take 1 tablet by mouth twice a day    Tamsulosin Hcl 0.4 Mg Caps (Tamsulosin hcl) .Marland Kitchen... 1po once daily to re-start  the generic flomax  Problem # 4:  DIABETES MELLITUS, TYPE II (ICD-250.00)  His updated medication list for this problem includes:    Ecotrin Low Strength 81 Mg Tbec (Aspirin) .Marland Kitchen... 1 by mouth each am    Metformin Hcl 500 Mg Tabs (Metformin hcl) .Marland Kitchen... Take 1 tablet by mouth twice a day  Orders: TLB-A1C / Hgb A1C (Glycohemoglobin) (83036-A1C) TLB-BMP (Basic Metabolic Panel-BMET) (80048-METABOL) TLB-Lipid Panel (80061-LIPID)  Complete Medication List: 1)  Symbicort 160-4.5 Mcg/act Aero (Budesonide-formoterol fumarate) .... Inhale 2 puff using inhaler twice a day 2)  Omeprazole 20 Mg Tbec (Omeprazole) .... 2 by mouth once daily 3)  Ecotrin Low Strength 81 Mg Tbec (Aspirin) .Marland Kitchen.. 1 by mouth each am 4)  Metformin Hcl 500 Mg Tabs (Metformin hcl) .... Take 1 tablet by mouth twice a day 5)  Clonidine Hcl 0.1 Mg Tabs (Clonidine hcl) .... Take 1 tablet by mouth twice a day 6)  Lovastatin 20 Mg Tabs (Lovastatin) .... Take 2 tablet by mouth once a day in evening 7)  Hydrochlorothiazide 25 Mg Tabs (Hydrochlorothiazide) .... Take 1 tablet by mouth once a day 8)  Prednisone 10 Mg Tabs (Prednisone) .... 1/2 by mouth once daily 9)  Spiriva Handihaler 18 Mcg Caps (Tiotropium bromide monohydrate) .Marland Kitchen.. 1 puff once daily 10)  Meloxicam 15 Mg Tabs (Meloxicam) .Marland Kitchen.. 1 by mouth once daily as needed pain 11)  Carafate 1 Gm/62ml Susp (Sucralfate) .... Take 10 cc. p.o. b.i.d. 12)  Colace 100 Mg Caps (Docusate sodium) .... Once daily 13)  Bentyl 20 Mg Tabs (Dicyclomine hcl) .... By mouth three times a day 14)  Tessalon Perles 100 Mg Caps (Benzonatate) .Marland Kitchen.. 1 - 2 by mouth three times a day as needed 15)  Ciprofloxacin Hcl 500 Mg Tabs (Ciprofloxacin hcl) .Marland Kitchen.. 1po two times a day 16)  Tamsulosin Hcl 0.4 Mg Caps (Tamsulosin hcl) .Marland Kitchen.. 1po once daily  Patient Instructions: 1)  you had the antibiotic shot today (rocephin) 2)  Please go to the Lab in the basement for your blood and/or urine tests today  3)  Please take  all new medications as prescribed - the antibiotic, and generic flomax 4)  Continue all previous medications as  before this visit  5)  you can cancel the june appt to return 6)  Please schedule a follow-up appointment in 6 months. Prescriptions: PREDNISONE 10 MG  TABS (PREDNISONE) 1/2 by mouth once daily  #15 x 11   Entered and Authorized by:   Corwin Levins MD   Signed by:   Corwin Levins MD on 04/01/2010   Method used:   Electronically to        [facility] [phone]* (retail)       [address]       [place], [place]  [address]       Ph: [phone]       Fax: [phone]   RxID:   [id number] TAMSULOSIN HCL 0.4 MG CAPS (TAMSULOSIN HCL) 1po once daily  #30 x 11   Entered and Authorized by:   Corwin Levins MD   Signed by:   Corwin Levins MD on 04/01/2010   Method used:   Electronically to        [facility] [phone]* (retail)       [address]       [place], [place]  [address]       Ph: [phone]       Fax: [phone]   RxID:   [id number] CIPROFLOXACIN HCL 500 MG TABS (CIPROFLOXACIN HCL) 1po two times a day  #42 x 0   Entered and Authorized by:   Corwin Levins MD   Signed by:   Corwin Levins MD on 04/01/2010   Method used:   Electronically to        [facility] [phone]* (retail)       [address]       [place], [place]  [address]       Ph: [phone]       Fax: [phone]   RxID:   [id number]    Medication Administration  Injection # 1:    Medication: Rocephin  250mg     Diagnosis: ACUTE PROSTATITIS (ICD-601.0)    Route: IM    Site: RUOQ gluteus    Exp Date: 03/2012    Lot #: [id number]    Mfr: Nova Plus    Given by: Zella Ball Ewing (April 01, 2010 3:19 PM)  Orders Added: 1)  T-Culture, Urine [[id number]] 2)  Rocephin  250mg  [J0696] 3)  Admin of Therapeutic Inj  intramuscular or subcutaneous [96372] 4)  TLB-Udip w/ Micro [81001-URINE] 5)  TLB-A1C / Hgb A1C (Glycohemoglobin) [83036-A1C] 6)  TLB-BMP (Basic Metabolic Panel-BMET)  [80048-METABOL] 7)  TLB-Lipid Panel [80061-LIPID] 8)  Est. Patient Level IV [99371]

## 2011-01-21 NOTE — Miscellaneous (Signed)
Summary: Orders Update pft charges  Clinical Lists Changes  Orders: Added new Service order of Carbon Monoxide diffusing w/capacity (94720) - Signed Added new Service order of Lung Volumes (94240) - Signed Added new Service order of Spirometry (Pre & Post) (94060) - Signed 

## 2011-01-21 NOTE — Progress Notes (Signed)
Summary: Rx req  Phone Note Call from Patient Call back at Home Phone [phone]   Caller: Patient Summary of Call: Pt called requesting Rx for Prednisone taper dose for COPD flare x 7 days (due to heat).  Initial call taken by: Margaret Pyle, CMA,  June 25, 2010 11:52 AM  Follow-up for Phone Call        sorry, pt will need OV ;  heat alone does not normally lead to need for prednisone treatment Follow-up by: Corwin Levins MD,  June 25, 2010 1:17 PM  Additional Follow-up for Phone Call Additional follow up Details #1::        pt informed and stated that he will call back and sch appt. Additional Follow-up by: Margaret Pyle, CMA,  June 25, 2010 1:51 PM

## 2011-01-21 NOTE — Progress Notes (Signed)
Summary: ABX/OV?  Phone Note Call from Patient Call back at Home Phone [phone]   Caller: Patient Summary of Call: pt called stating that ABX Rx'd last OV has not helped and pt is still congested and still has cough. pt is requesting another ABX. please advise, OV or RX? Initial call taken by: Margaret Pyle, CMA,  March 04, 2010 12:05 PM  Follow-up for Phone Call        try change to zpack - done escript Follow-up by: Corwin Levins MD,  March 04, 2010 1:01 PM  Additional Follow-up for Phone Call Additional follow up Details #1::        pt informed Additional Follow-up by: Margaret Pyle, CMA,  March 04, 2010 1:48 PM    New/Updated Medications: AZITHROMYCIN 250 MG TABS (AZITHROMYCIN) 2po qd for 1 day, then 1po qd for 4days, then stop Prescriptions: AZITHROMYCIN 250 MG TABS (AZITHROMYCIN) 2po qd for 1 day, then 1po qd for 4days, then stop  #6 x 0   Entered and Authorized by:   Corwin Levins MD   Signed by:   Corwin Levins MD on 03/04/2010   Method used:   Electronically to        [facility] [phone]* (retail)       [address]       [place], [place]  [address]       Ph: [phone]       Fax: [phone]   RxID:   [id number]

## 2011-01-21 NOTE — Miscellaneous (Signed)
Summary: Orders Update  Clinical Lists Changes  Problems: Added new problem of PSA, INCREASED (ICD-790.93) Orders: Added new Referral order of Urology Referral (Urology) - Signed 

## 2011-01-21 NOTE — Progress Notes (Signed)
Summary: Pnemovax reaction?  Phone Note Call from Patient Call back at Home Phone [phone]   Caller: Patient Summary of Call: pt called stating that he has slight swelling and soreness in RT arm from Pnemovax. Pt says soreness and swelling have reduced since last night when he first noticed it and just wnted to know if this is normal.  Pt denied and fever and said that area was not warm to the touch. I advised pt that this reaction is possible and to apply a wam compress to area and if MD had any further suggestion I would call back. Initial call taken by: Margaret Pyle, CMA,  February 15, 2010 10:59 AM  Follow-up for Phone Call        I agree;  should consider OV if redness or pain or fever worse Follow-up by: Corwin Levins MD,  February 15, 2010 12:39 PM

## 2011-01-21 NOTE — Assessment & Plan Note (Signed)
Summary: Pulmonary re -eval/ copd steroid dep ? why? , no desat, hfa 90%   Visit Type:  Initial Consult Copy to:  Dr. [name] Primary Kerryann Allaire/Referring Nishan Ovens:  Richard Barre, MD  CC:  Pulmonary consult for COPD...pt c/o fatigue for over 6 months...denies any sob.  History of Present Illness: 79  wobm still smoking with gradually  worse  doe  x since early 2000's  03/15/2008 Pulmonary eval, rec stop smoking, return if more sob or cough  August 30, 2010 Pulmonary consult for COPD...pt c/o fatigue for over 6 months...denies any sob says fatigue better when taking prednisone, worse when off it.  Prednisone rx per Dr [name] "for breathing"  Sleeps ok resp - wise, no am cough or congestion.  can't afford consistent symbicort only the 4 dollar [employer] plan.  taking spiriva as needed.  on a good day can walk behind his mower self propelled for 30 min.  Pt denies any significant sore throat, dysphagia, itching, sneezing,  nasal congestion or excess secretions,  fever, chills, sweats, unintended wt loss, pleuritic or exertional cp, hempoptysis, change in activity tolerance  orthopnea pnd or leg swelling.  Pt also denies any obvious fluctuation in symptoms with weather or environmental change or other alleviating or aggravating factors.     no obvious reason when his days are really bad,  energy >> sob limiting on bad days.     Preventive Screening-Counseling & Management  Alcohol-Tobacco     Smoking Status: current     Packs/Day: 0.5  Current Medications (verified): 1)  Symbicort 160-4.5 Mcg/act Aero (Budesonide-Formoterol Fumarate) .... Inhale 2 Puff Using Inhaler Twice A Day 2)  Omeprazole 20 Mg Tbec (Omeprazole) .... 2 By Mouth Once Daily 3)  Ecotrin Low Strength 81 Mg  Tbec (Aspirin) .Marland Kitchen.. 1 By Mouth Each Am 4)  Metformin Hcl 500 Mg Tabs (Metformin Hcl) .... Take 1 Tablet By Mouth Twice A Day 5)  Clonidine Hcl 0.1 Mg  Tabs (Clonidine Hcl) .... Take 1 Tablet By Mouth Twice A Day 6)   Lovastatin 20 Mg Tabs (Lovastatin) .... Take 2 Tablet By Mouth Once A Day in Evening 7)  Hydrochlorothiazide 25 Mg  Tabs (Hydrochlorothiazide) .... Take 1 Tablet By Mouth Once A Day 8)  Prednisone 10 Mg  Tabs (Prednisone) .... 1/2 By Mouth Once Daily 9)  Spiriva Handihaler 18 Mcg Caps (Tiotropium Bromide Monohydrate) .Marland Kitchen.. 1 Puff Once Daily 10)  Tamsulosin Hcl 0.4 Mg Caps (Tamsulosin Hcl) .Marland Kitchen.. 1po Once Daily  Allergies (verified): 1)  ! Lisinopril (Lisinopril) 2)  ! Ace Inhibitors  Past History:  Past Medical History: COPD............................................Marland KitchenWert   -  2009  FEV1 baseline of 1.8  >  course of prednisone with an FEV1 up to 2.45 with a ratio of 57%    - ? steroid dep since 10/2009    - HFA 90% p coaching August 30, 2010    -  PFT's rec August 30, 2010  Diabetes mellitus, type II Hyperlipidemia Hypertension GERD esoph stricture e.d. Allergic rhinitis Benign prostatic hypertrophy  Family History: Family History Diabetes 1st degree relative: Mother Family History Hypertension No FH of Colon Cancer: Negative for respiratory diseases or atopy   Social History: Reviewed history from 11/23/2009 and no changes required. Current Smoker Alcohol use-yes Patient does not get regular exercise.  Occupation: Disabled Daily Caffeine Use Illicit Drug Use - no Packs/Day:  0.5  Vital Signs:  Patient profile:   65 year old male Height:  76 inches (193.04 cm) Weight:      223 pounds (101.36 kg) BMI:     27.24 O2 Sat:      96 % on Room air Temp:     98.2 degrees F (36.78 degrees C) oral Pulse rate:   77 / minute BP sitting:   118 / 78  (left arm) Cuff size:   regular  Vitals Entered By: Michel Bickers CMA (August 30, 2010 1:29 PM)  O2 Sat at Rest %:  96 O2 Flow:  Room air CC: Pulmonary consult for COPD...pt c/o fatigue for over 6 months...denies any sob Comments Medications reviewed with the patient. Daytime phone verified. Michel Bickers [facility]   August 30, 2010 1:30 PM   Physical Exam  Additional Exam:  in general is a  somber elderly black male in no acute distress wt 223 August 30, 2010  .HEENT mild turbinate edema.  Oropharynx no thrush or excess pnd or cobblestoning.  No JVD or cervical adenopathy. Mild accessory muscle hypertrophy. Trachea midline, nl thryroid. Chest was hyperinflated by percussion with diminished breath sounds and moderate increased exp time with MID expiratory bilateral  wheeze. Hoover sign positive at mid inspiration. Regular rate and rhythm without murmur gallop or rub or increase P2.  Abd: no hsm, nl excursion. Ext warm without cyanosis clubbing or edema.      Impression & Recommendations:  Problem # 1:  COPD (ICD-496)   DDX of  difficult airways managment all start with A and  include Adherence, Ace Inhibitors, Acid Reflux, Active Sinus Disease, Alpha 1 Antitripsin deficiency, Anxiety masquerading as Airways dz,  ABPA,  allergy(esp in young), Aspiration (esp in elderly), Adverse effects of DPI,  Active smokers, plus one B  = Beta blocker use.Marland Kitchen    Active smoking a major concern  ? Acid reflux:  diet/ ppi reviewd  ? anxiety/ depression ? psychogenic results of prednisone vs nonadherence to symbicort resulting in prednisone dependency at this point  ? adherence :   Each maintenance medication was reviewed in detail including most importantly the difference between maintenance and as needed and under what circumstances the prns are to be used. he simply doesn't understand this concept as it applies to pulmonary meds and this may have resulted in maintenance of what should probably only have been needed short term (prednisone) See instructions for specific recommendations   Problem # 2:  SMOKER (ICD-305.1)  Discussed but not ready to committ to quit at this point - emphasized risks involved in continuing smoking and that patient should consider these in the context of the cost of smoking relative to  the benefit obtained.   Orders: Tobacco use cessation intermediate 3-10 minutes ([address]644)  Medications Added to Medication List This Visit: 1)  Omeprazole 20 Mg Tbec (Omeprazole) .... Take  one 30-60 min before first meal of the day 2)  Symbicort 160-4.5 Mcg/act Aero (Budesonide-formoterol fumarate) .... Has not used since first week in september  Other Orders: Est. Patient Level IV (99214) Pulse Oximetry, Ambulatory (03474) HFA Instruction [phone])  Patient Instructions: 1)  ok to use symbicort 2 puffs every 12 hours if you feel it helps  2)  stop spiriva, it cannot be used as needed 3)  GERD (REFLUX)  is a common cause of respiratory symptoms. It commonly presents without heartburn and can be treated with medication, but also with lifestyle changes including avoidance of late meals, excessive alcohol, smoking cessation, and avoid fatty foods, chocolate, peppermint, colas, red wine, and  acidic juices such as orange juice. NO MINT OR MENTHOL PRODUCTS SO NO COUGH DROPS  4)  USE SUGARLESS CANDY INSTEAD (jolley ranchers)  5)  NO OIL BASED VITAMINS  6)  Please schedule a follow-up appointment in 6 weeks, sooner if needed for PFT's  7)  stop smoking before it stops you  Appended Document: Pulmonary re -eval/ copd steroid dep ? why? , no desat, hfa 90% 08/20/30 cxr reviewed and concur with radiology:  Stable chest x-ray with no evidence of acute cardiac or pulmonary process.  Appended Document: Pulmonary re -eval/ copd steroid dep ? why? , no desat, hfa 90% Ambulatory Pulse Oximetry  Resting; HR___80__    02 Sat__95% on room air___  Lap1 (185 feet)   HR__93___   02 Sat__96% on room air___ Lap2 (185 feet)   HR__97___   02 Sat__95% on room air___    Lap3 (185 feet)   HR__96___   02 Sat__96% on room air___  _x__Test Completed without Difficulty ___Test Stopped due to:

## 2011-01-21 NOTE — Letter (Signed)
Summary: Request for medial records/[facility]  Request for medial records/[facility]   Imported By: Sherian Rein 07/15/2010 11:32:46  _____________________________________________________________________  External Attachment:    Type:   Image     Comment:   External Document

## 2011-01-21 NOTE — Assessment & Plan Note (Signed)
Summary: CONGESTION/NWS #   Vital Signs:  Patient profile:   65 year old male Height:      75 inches Weight:      218.25 pounds BMI:     27.38 O2 Sat:      94 % on Room air Temp:     97.8 degrees F oral Pulse rate:   94 / minute BP sitting:   122 / 70  (left arm) Cuff size:   large  Vitals Entered ByZella Ball Ewing (February 14, 2010 1:49 PM)  O2 Flow:  Room air  Preventive Care Screening  Colonoscopy:    Date:  08/23/2007    Next Due:  08/2010    Results:  Adenomatous Polyp      declines tetanus for now   CC: congestion, cough/RE   Primary Care Provider:  [name], MD  CC:  congestion and cough/RE.  History of Present Illness: here with acute onset fever, ST, malaise, headache, general weakness, and mild prod cough greenish sputum with mild wheezing on current meds for 2 days, overall mild but had some mild doe as well so felt he needed to come in;  Pt denies other CP, orthopnea, pnd, worsening LE edema, palps, dizziness or syncope.  Pt denies new neuro symptoms such as headache, facial or extremity weakness   Pt denies polydipsia, polyuria, or low sugar symptoms such as shakiness improved with eating.  Overall good compliance with meds, trying to follow low chol, DM diet, wt stable, little excercise however  .  Unfort out of symbicort for the past 2 wks Here for wellness Diet: Heart Healthy or DM if diabetic Physical Activities: Sedentary Depression/mood screen: Negative Hearing: Intact bilateral Visual Acuity: Grossly normal, last optho exam 2 yrs ago ADL's: Capable  Fall Risk: None Home Safety: Good End-of-Life Planning: Advance directive - Full code/I agree   Problems Prior to Update: 1)  Wheezing  (ICD-786.07) 2)  Bronchitis-acute  (ICD-466.0) 3)  Thumb Pain, Left  (ICD-729.5) 4)  Abnormal Electrocardiogram  (ICD-794.31) 5)  Fatigue  (ICD-780.79) 6)  Hand Pain, Left  (ICD-729.5) 7)  Thrush  (ICD-112.0) 8)  Mass, Superficial  (ICD-782.2) 9)  Benign  Prostatic Hypertrophy  (ICD-600.00) 10)  Nocturia  (ICD-788.43) 11)  Chronic Obstructive Pulmonary Disease, Acute Exacerbation  (ICD-491.21) 12)  Special Screening Malignant Neoplasm of Prostate  (ICD-V76.44) 13)  Fatigue  (ICD-780.79) 14)  Thrush  (ICD-112.0) 15)  Chronic Obstructive Pulmonary Disease, Acute Exacerbation  (ICD-491.21) 16)  Diverticular Disease  (ICD-562.10) 17)  Adenomatous Colonic Polyp  (ICD-211.3) 18)  Skin Lesion  (ICD-709.9) 19)  Pulmonary Fibrosis  (ICD-515) 20)  Dyspnea/shortness of Breath  (ICD-786.09) 21)  Fatigue  (ICD-780.79) 22)  Family History Diabetes 1st Degree Relative  (ICD-V18.0) 23)  Lumbar Disc Disorder  (ICD-722.93) 24)  Allergic Rhinitis  (ICD-477.9) 25)  Erectile Dysfunction, Organic, 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 Inhale 2 Puff Using Inhaler Twice A Day 2)  Omeprazole 20 Mg Tbec (Omeprazole) .... 2 By Mouth Once Daily 3)  Ecotrin Low Strength 81 Mg  Tbec (Aspirin) .Marland Kitchen.. 1 By Mouth Each Am 4)  Metformin Hcl 500 Mg Tabs (Metformin Hcl) .... Take 1 Tablet By Mouth Twice A Day 5)  Clonidine Hcl 0.1  Mg  Tabs (Clonidine Hcl) .... Take 1 Tablet By Mouth Twice A Day 6)  Lovastatin 20 Mg Tabs (Lovastatin) .... Take 2 Tablet By Mouth Once A Day in Evening 7)  Hydrochlorothiazide 25 Mg  Tabs (Hydrochlorothiazide) .... Take 1 Tablet By Mouth Once A Day 8)  Prednisone 10 Mg  Tabs (Prednisone) .... 1/2 By Mouth Once Daily 9)  Prednisone 10 Mg Tabs (Prednisone) .... 3po Qd For 3days, Then 2po Qd For 3days, Then 1po Qd For 3days, Then Stop 10)  Spiriva Handihaler 18 Mcg Caps (Tiotropium Bromide Monohydrate) .Marland Kitchen.. 1 Puff Once Daily 11)  Meloxicam 15 Mg Tabs (Meloxicam) .Marland Kitchen.. 1 By  Mouth Once Daily As Needed Pain 12)  Carafate 1 Gm/40ml Susp (Sucralfate) .... Take 10 Cc. P.o. B.i.d. 13)  Colace 100 Mg  Caps (Docusate Sodium) .... Once Daily 14)  Bentyl 20 Mg  Tabs (Dicyclomine Hcl) .... By Mouth Three Times A Day  Current Medications (verified): 1)  Symbicort 160-4.5 Mcg/act Aero (Budesonide-Formoterol Fumarate) .... Inhale 2 Puff Using Inhaler Twice A Day 2)  Omeprazole 20 Mg Tbec (Omeprazole) .... 2 By Mouth Once Daily 3)  Ecotrin Low Strength 81 Mg  Tbec (Aspirin) .Marland Kitchen.. 1 By Mouth Each Am 4)  Metformin Hcl 500 Mg Tabs (Metformin Hcl) .... Take 1 Tablet By Mouth Twice A Day 5)  Clonidine Hcl 0.1 Mg  Tabs (Clonidine Hcl) .... Take 1 Tablet By Mouth Twice A Day 6)  Lovastatin 20 Mg Tabs (Lovastatin) .... Take 2 Tablet By Mouth Once A Day in Evening 7)  Hydrochlorothiazide 25 Mg  Tabs (Hydrochlorothiazide) .... Take 1 Tablet By Mouth Once A Day 8)  Prednisone 10 Mg  Tabs (Prednisone) .... 1/2 By Mouth Once Daily 9)  Prednisone 10 Mg Tabs (Prednisone) .... 3po Qd For 3days, Then 2po Qd For 3days, Then 1po Qd For 3days, Then Stop 10)  Spiriva Handihaler 18 Mcg Caps (Tiotropium Bromide Monohydrate) .Marland Kitchen.. 1 Puff Once Daily 11)  Meloxicam 15 Mg Tabs (Meloxicam) .Marland Kitchen.. 1 By Mouth Once Daily As Needed Pain 12)  Carafate 1 Gm/15ml Susp (Sucralfate) .... Take 10 Cc. P.o. B.i.d. 13)  Colace 100 Mg  Caps (Docusate Sodium) .... Once Daily 14)  Bentyl 20 Mg  Tabs (Dicyclomine Hcl) .... By Mouth Three Times A Day 15)  Cephalexin 500 Mg Caps (Cephalexin) .Marland Kitchen.. 1po Three Times A Day 16)  Prednisone 10 Mg Tabs (Prednisone) .... 4po Qd For 3days, Then 3po Qd For 3days, Then 2po Qd For 3days, Then 1po Qd For 3 Days, Then Stop 17)  Tessalon Perles 100 Mg Caps (Benzonatate) .Marland Kitchen.. 1 - 2 By Mouth Three Times A Day As Needed  Allergies (verified): 1)  ! Lisinopril (Lisinopril) 2)  ! Ace Inhibitors  Past History:  Past Medical History: Last updated: 10/09/2008 COPD Diabetes mellitus, type  II Hyperlipidemia Hypertension GERD esoph stricture e.d. Allergic rhinitis Benign prostatic hypertrophy  Past Surgical History: Last updated: 11/21/2009 back surgery x 4 - disabled after 2003  Family History: Last updated: 11/21/2009 Family History Diabetes 1st degree relative: Mother Family History Hypertension No FH of Colon Cancer:  Social History: Last updated: 11/23/2009 Current Smoker Alcohol use-yes Patient does not get regular exercise.  Occupation: Disabled Daily Caffeine Use Illicit Drug Use - no  Risk Factors: Exercise: no (11/21/2009)  Risk Factors: Smoking Status: current (09/28/2007)  Review of Systems  The patient denies anorexia, fever, weight loss, weight gain, decreased hearing, hoarseness, chest pain, syncope, dyspnea on exertion, peripheral edema, prolonged cough, headaches, hemoptysis,  abdominal pain, melena, hematochezia, severe indigestion/heartburn, hematuria, incontinence, muscle weakness, suspicious skin lesions, difficulty walking, depression, unusual weight change, abnormal bleeding, enlarged lymph nodes, and angioedema.         all otherwise negative per pt -  Physical Exam  General:  alert and overweight-appearing. , mild ill  Head:  normocephalic and atraumatic.   Eyes:  vision grossly intact, pupils equal, and pupils round.   Ears:  bilat tm's mild red, sinus nontender Nose:  nasal dischargemucosal pallor and mucosal edema.   Mouth:  pharyngeal erythema and fair dentition.   Neck:  supple and cervical lymphadenopathy.   Lungs:  normal respiratory effort, R decreased breath sounds, R wheezes, L decreased breath sounds, and L wheezes.   Heart:  normal rate and regular rhythm.   Abdomen:  soft, non-tender, and normal bowel sounds.   Msk:  no joint tenderness and no joint swelling.   Extremities:  no edema, no erythema  Neurologic:  cranial nerves II-XII intact and strength normal in all extremities.   Skin:  color normal and no  rashes.     Impression & Recommendations:  Problem # 1:  Preventive Health Care (ICD-V70.0)  Overall doing well, age appropriate education and counseling updated and referral for appropriate preventive services done unless declined, immunizations up to date or declined, diet counseling done if overweight, urged to quit smoking if smokes , most recent labs reviewed and current ordered if appropriate, ecg reviewed or declined (interpretation per ECG scanned in the EMR if done); information regarding Medicare Prevention requirements given if appropriate   Orders: First annual wellness visit with prevention plan  (Z6109)  Problem # 2:  BRONCHITIS-ACUTE (ICD-466.0)  His updated medication list for this problem includes:    Symbicort 160-4.5 Mcg/act Aero (Budesonide-formoterol fumarate) ..... Inhale 2 puff using inhaler twice a day    Spiriva Handihaler 18 Mcg Caps (Tiotropium bromide monohydrate) .Marland Kitchen... 1 puff once daily    Cephalexin 500 Mg Caps (Cephalexin) .Marland Kitchen... 1po three times a day    Tessalon Perles 100 Mg Caps (Benzonatate) .Marland Kitchen... 1 - 2 by mouth three times a day as needed  Orders: Depo- Medrol 40mg  (J1030) Depo- Medrol 80mg  (J1040) Admin of Therapeutic Inj  intramuscular or subcutaneous (60454) Prescription Created Electronically [phone]) treat as above, f/u any worsening signs or symptoms   Problem # 3:  WHEEZING (ICD-786.07) for depomedrol today, prednisone burst and taper to baseline  Problem # 4:  HYPERTENSION (ICD-401.9)  His updated medication list for this problem includes:    Clonidine Hcl 0.1 Mg Tabs (Clonidine hcl) .Marland Kitchen... Take 1 tablet by mouth twice a day    Hydrochlorothiazide 25 Mg Tabs (Hydrochlorothiazide) .Marland Kitchen... Take 1 tablet by mouth once a day  BP today: 122/70 Prior BP: 122/86 (11/23/2009)  Labs Reviewed: K+: 3.7 (10/23/2009) Creat: : 1.1 (10/23/2009)   Chol: 160 (10/23/2009)   HDL: 44.70 (10/23/2009)   LDL: 93 (10/23/2009)   TG: 113.0 (10/23/2009) stable  overall by hx and exam, ok to continue meds/tx as is   Problem # 5:  DIABETES MELLITUS, TYPE II (ICD-250.00)  His updated medication list for this problem includes:    Ecotrin Low Strength 81 Mg Tbec (Aspirin) .Marland Kitchen... 1 by mouth each am    Metformin Hcl 500 Mg Tabs (Metformin hcl) .Marland Kitchen... Take 1 tablet by mouth twice a day  Labs Reviewed: Creat: 1.1 (10/23/2009)    Reviewed HgBA1c results: 6.3 (10/23/2009)  6.3 (06/20/2009) stable overall by hx and exam, ok to continue meds/tx  as is   Complete Medication List: 1)  Symbicort 160-4.5 Mcg/act Aero (Budesonide-formoterol fumarate) .... Inhale 2 puff using inhaler twice a day 2)  Omeprazole 20 Mg Tbec (Omeprazole) .... 2 by mouth once daily 3)  Ecotrin Low Strength 81 Mg Tbec (Aspirin) .Marland Kitchen.. 1 by mouth each am 4)  Metformin Hcl 500 Mg Tabs (Metformin hcl) .... Take 1 tablet by mouth twice a day 5)  Clonidine Hcl 0.1 Mg Tabs (Clonidine hcl) .... Take 1 tablet by mouth twice a day 6)  Lovastatin 20 Mg Tabs (Lovastatin) .... Take 2 tablet by mouth once a day in evening 7)  Hydrochlorothiazide 25 Mg Tabs (Hydrochlorothiazide) .... Take 1 tablet by mouth once a day 8)  Prednisone 10 Mg Tabs (Prednisone) .... 1/2 by mouth once daily 9)  Prednisone 10 Mg Tabs (Prednisone) .... 3po qd for 3days, then 2po qd for 3days, then 1po qd for 3days, then stop 10)  Spiriva Handihaler 18 Mcg Caps (Tiotropium bromide monohydrate) .Marland Kitchen.. 1 puff once daily 11)  Meloxicam 15 Mg Tabs (Meloxicam) .Marland Kitchen.. 1 by mouth once daily as needed pain 12)  Carafate 1 Gm/62ml Susp (Sucralfate) .... Take 10 cc. p.o. b.i.d. 13)  Colace 100 Mg Caps (Docusate sodium) .... Once daily 14)  Bentyl 20 Mg Tabs (Dicyclomine hcl) .... By mouth three times a day 15)  Cephalexin 500 Mg Caps (Cephalexin) .Marland Kitchen.. 1po three times a day 16)  Prednisone 10 Mg Tabs (Prednisone) .... 4po qd for 3days, then 3po qd for 3days, then 2po qd for 3days, then 1po qd for 3 days, then stop 17)  Tessalon Perles 100 Mg  Caps (Benzonatate) .Marland Kitchen.. 1 - 2 by mouth three times a day as needed  Other Orders: Pneumococcal Vaccine (16109) Admin 1st Vaccine (60454)  Patient Instructions: 1)  you had the steroid shot today 2)  use the advar sample inhaler at 2 puffs two times a day  3)  Please take all new medications as prescribed - antibiotic, and prednisone, and cough medicine 4)  Continue all previous medications as before this visit  5)  Please schedule a follow-up appointment in 4 months for "yearly exam" 6)  please call for your yearly opthomology exam Prescriptions: TESSALON PERLES 100 MG CAPS (BENZONATATE) 1 - 2 by mouth three times a day as needed  #60 x 1   Entered and Authorized by:   Corwin Levins MD   Signed by:   Corwin Levins MD on 02/14/2010   Method used:   Print then Give to Patient   RxID:   [id number] PREDNISONE 10 MG TABS (PREDNISONE) 4po qd for 3days, then 3po qd for 3days, then 2po qd for 3days, then 1po qd for 3 days, then stop  #30 x 0   Entered and Authorized by:   Corwin Levins MD   Signed by:   Corwin Levins MD on 02/14/2010   Method used:   Print then Give to Patient   RxID:   [id number] CEPHALEXIN 500 MG CAPS (CEPHALEXIN) 1po three times a day  #30 x 0   Entered and Authorized by:   Corwin Levins MD   Signed by:   Corwin Levins MD on 02/14/2010   Method used:   Print then Give to Patient   RxID:   [id number]    Immunizations Administered:  Pneumonia Vaccine:    Vaccine Type: Pneumovax    Site: right deltoid    Mfr: Merck    Dose: 0.5 ml  Route: IM    Given by: Zella Ball Ewing    Exp. Date: 01/17/2011    Lot #: [id number]    VIS given: 07/19/96 version given February 14, 2010.    Medication Administration  Injection # 1:    Medication: Depo- Medrol 40mg     Diagnosis: BRONCHITIS-ACUTE (ICD-466.0)    Route: IM    Site: RUOQ gluteus    Exp Date: 09/2010    Lot #: [id number]    Mfr: Teva    Given by: Zella Ball Ewing (February 14, 2010 2:42 PM)  Injection # 2:     Medication: Depo- Medrol 80mg     Diagnosis: BRONCHITIS-ACUTE (ICD-466.0)    Route: IM    Site: RUOQ gluteus    Exp Date: 09/2010    Lot #: [id number]    Mfr: Teva    Given by: Zella Ball Ewing (February 14, 2010 2:42 PM)  Orders Added: 1)  Pneumococcal Vaccine [90732] 2)  Admin 1st Vaccine [90471] 3)  Depo- Medrol 40mg  [J1030] 4)  Depo- Medrol 80mg  [J1040] 5)  Admin of Therapeutic Inj  intramuscular or subcutaneous [96372] 6)  Prescription Created Electronically [G8553] 7)  First annual wellness visit with prevention plan  [G0438] 8)  Est. Patient Level IV [18841]

## 2011-01-21 NOTE — Assessment & Plan Note (Signed)
Summary: DISCUSS HAVING STEROID SHOT/NWS #   Vital Signs:  Patient profile:   65 year old male Height:      76 inches Weight:      220.25 pounds BMI:     26.91 O2 Sat:      96 % on Room air Temp:     97.2 degrees F oral Pulse rate:   75 / minute BP sitting:   112 / 70  (left arm) Cuff size:   large  Vitals Entered By: Zella Ball Ewing CMA Duncan Dull) (August 20, 2010 11:46 AM)  O2 Flow:  Room air CC: fatigue, no energy/RE   Primary Care Provider:  [name], MD  CC:  fatigue and no energy/RE.  History of Present Illness: c/o severe fatigue;  thinks the steroids have always helped in the past but doesnt last;  energy level jsut very low, and also ? his testosterone level. Pt denies CP, worsening sob, doe, wheezing, orthopnea, pnd, worsening LE edema, palps, dizziness or syncope   Pt denies new neuro symptoms such as headache, facial or extremity weakness   Symbicort helps quite a bit with the breathing,  not sure about th energy, and does not take all the time due to cost.  Chores around the house are very diffcult, can only walk about 30 yrs just to rest.  Has to sit , especially in the car when he gets back to it from shopping.  No fever, wt loss, night sweats, loss of appetite or other constitutional symptoms Pt denies polydipsia, polyuria, or low sugar symptoms such as shakiness improved with eating.  Overall good compliance with meds, trying to follow low chol, DM diet, wt stable, little excercise however   Problems Prior to Update: 1)  Dyspnea  (ICD-786.05) 2)  Psa, Increased  (ICD-790.93) 3)  Epididymo-orchitis  (ICD-604.90) 4)  Acute Prostatitis  (ICD-601.0) 5)  Preventive Health Care  (ICD-V70.0) 6)  Wheezing  (ICD-786.07) 7)  Thumb Pain, Left  (ICD-729.5) 8)  Abnormal Electrocardiogram  (ICD-794.31) 9)  Fatigue  (ICD-780.79) 10)  Hand Pain, Left  (ICD-729.5) 11)  Thrush  (ICD-112.0) 12)  Mass, Superficial  (ICD-782.2) 13)  Benign Prostatic Hypertrophy  (ICD-600.00) 14)   Nocturia  (ICD-788.43) 15)  Chronic Obstructive Pulmonary Disease, Acute Exacerbation  (ICD-491.21) 16)  Special Screening Malignant Neoplasm of Prostate  (ICD-V76.44) 17)  Fatigue  (ICD-780.79) 18)  Thrush  (ICD-112.0) 19)  Chronic Obstructive Pulmonary Disease, Acute Exacerbation  (ICD-491.21) 20)  Diverticular Disease  (ICD-562.10) 21)  Adenomatous Colonic Polyp  (ICD-211.3) 22)  Skin Lesion  (ICD-709.9) 23)  Pulmonary Fibrosis  (ICD-515) 24)  Dyspnea/shortness of Breath  (ICD-786.09) 25)  Fatigue  (ICD-780.79) 26)  Family History Diabetes 1st Degree Relative  (ICD-V18.0) 27)  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 Inhale 2 Puff Using Inhaler Twice A Day 2)  Omeprazole 20 Mg Tbec (Omeprazole) .... 2 By Mouth Once Daily 3)  Ecotrin Low Strength 81 Mg  Tbec (Aspirin) .Marland Kitchen.. 1 By Mouth Each Am 4)  Metformin Hcl 500 Mg Tabs (Metformin Hcl) .... Take 1 Tablet By Mouth Twice A Day 5)  Clonidine Hcl 0.1 Mg  Tabs (Clonidine Hcl) .... Take 1 Tablet By Mouth Twice A Day 6)  Lovastatin 20 Mg Tabs (Lovastatin) .... Take 2 Tablet By Mouth Once A Day in Evening 7)  Hydrochlorothiazide 25 Mg  Tabs (Hydrochlorothiazide) .... Take 1 Tablet By Mouth Once A Day 8)  Prednisone 10 Mg  Tabs (Prednisone) .... 1/2 By Mouth Once Daily 9)  Spiriva Handihaler 18 Mcg Caps (Tiotropium Bromide Monohydrate) .Marland Kitchen.. 1 Puff Once Daily 10)  Tessalon Perles 100 Mg Caps (Benzonatate) .Marland Kitchen.. 1 - 2 By Mouth Three Times A Day As Needed For Cough 11)  Tamsulosin Hcl 0.4 Mg Caps (Tamsulosin Hcl) .Marland Kitchen.. 1po Once Daily 12)  Azithromycin 250 Mg Tabs  (Azithromycin) .... 2po Qd For 1 Day, Then 1po Qd For 4days, Then Stop 13)  Prednisone 10 Mg Tabs (Prednisone) .... 4po Qd For 3days, Then 3po Qd For 3days, Then 2po Qd For 3days, Then 1po Qd For 3 Days, Then Stop  Current Medications (verified): 1)  Symbicort 160-4.5 Mcg/act Aero (Budesonide-Formoterol Fumarate) .... Inhale 2 Puff Using Inhaler Twice A Day 2)  Omeprazole 20 Mg Tbec (Omeprazole) .... 2 By Mouth Once Daily 3)  Ecotrin Low Strength 81 Mg  Tbec (Aspirin) .Marland Kitchen.. 1 By Mouth Each Am 4)  Metformin Hcl 500 Mg Tabs (Metformin Hcl) .... Take 1 Tablet By Mouth Twice A Day 5)  Clonidine Hcl 0.1 Mg  Tabs (Clonidine Hcl) .... Take 1 Tablet By Mouth Twice A Day 6)  Lovastatin 20 Mg Tabs (Lovastatin) .... Take 2 Tablet By Mouth Once A Day in Evening 7)  Hydrochlorothiazide 25 Mg  Tabs (Hydrochlorothiazide) .... Take 1 Tablet By Mouth Once A Day 8)  Prednisone 10 Mg  Tabs (Prednisone) .... 1/2 By Mouth Once Daily 9)  Spiriva Handihaler 18 Mcg Caps (Tiotropium Bromide Monohydrate) .Marland Kitchen.. 1 Puff Once Daily 10)  Tessalon Perles 100 Mg Caps (Benzonatate) .Marland Kitchen.. 1 - 2 By Mouth Three Times A Day As Needed For Cough 11)  Tamsulosin Hcl 0.4 Mg Caps (Tamsulosin Hcl) .Marland Kitchen.. 1po Once Daily 12)  Azithromycin 250 Mg Tabs (Azithromycin) .... 2po Qd For 1 Day, Then 1po Qd For 4days, Then Stop 13)  Prednisone 10 Mg Tabs (Prednisone) .... 4po Qd For 3days, Then 3po Qd For 3days, Then 2po Qd For 3days, Then 1po Qd For 3 Days, Then Stop  Allergies (verified): 1)  ! Lisinopril (Lisinopril) 2)  ! Ace Inhibitors  Past History:  Past Medical History: Last updated: 10/09/2008 COPD Diabetes mellitus, type II Hyperlipidemia Hypertension GERD esoph stricture e.d. Allergic rhinitis Benign prostatic hypertrophy  Past Surgical History: Last updated: 11/21/2009 back surgery x 4 - disabled after 2003  Social History: Last updated: 11/23/2009 Current Smoker Alcohol use-yes Patient does not get regular exercise.    Occupation: Disabled Daily Caffeine Use Illicit Drug Use - no  Risk Factors: Exercise: no (11/21/2009)  Risk Factors: Smoking Status: current (09/28/2007)  Review of Systems       all otherwise negative per pt -    Physical Exam  General:  alert and overweight-appearing.   Head:  normocephalic and atraumatic.   Eyes:  vision grossly intact, pupils equal, and pupils round.   Ears:  R ear normal and L ear normal.   Nose:  no external deformity and no nasal discharge.   Mouth:  no gingival abnormalities and pharynx pink and moist.   Neck:  supple and no masses.   Lungs:  normal respiratory effort, R decreased breath sounds, and L decreased breath sounds.  Heart:  normal rate and regular rhythm.   Abdomen:  soft, non-tender, and normal bowel sounds.   Msk:  no joint tenderness and no joint swelling.   Extremities:  no edema, no erythema  Neurologic:  cranial nerves II-XII intact and strength normal in all extremities.     Impression & Recommendations:  Problem # 1:  DYSPNEA (ICD-786.05)  most likely related to his COAD exac but seems worse this time overall adn cant r/o other etiology;  for ecg, cxr, PFT's, echo, f/u with Dr Sherene Sires, as well as for today depomedrol IM, and pred pack for home  Orders: EKG w/ Interpretation (93000) Depo- Medrol 80mg  (J1040) T-2 View CXR, Same Day (71020.5TC) Misc. Referral (Misc. Ref) Pulmonary Referral (Pulmonary) Echo Referral (Echo)  Problem # 2:  FATIGUE (ICD-780.79) recent labs reviewed with pt;  doubt he needs repeat labs at this time; ok to follow  Problem # 3:  HYPERTENSION (ICD-401.9)  His updated medication list for this problem includes:    Clonidine Hcl 0.1 Mg Tabs (Clonidine hcl) .Marland Kitchen... Take 1 tablet by mouth twice a day    Hydrochlorothiazide 25 Mg Tabs (Hydrochlorothiazide) .Marland Kitchen... Take 1 tablet by mouth once a day  BP today: 112/70 Prior BP: 110/72 (06/26/2010)  Labs Reviewed: K+: 4.4 (06/26/2010) Creat: : 1.1  (06/26/2010)   Chol: 188 (06/26/2010)   HDL: 70.50 (06/26/2010)   LDL: 86 (06/26/2010)   TG: 158.0 (06/26/2010) stable overall by hx and exam, ok to continue meds/tx as is   Problem # 4:  DIABETES MELLITUS, TYPE II (ICD-250.00)  His updated medication list for this problem includes:    Ecotrin Low Strength 81 Mg Tbec (Aspirin) .Marland Kitchen... 1 by mouth each am    Metformin Hcl 500 Mg Tabs (Metformin hcl) .Marland Kitchen... Take 1 tablet by mouth twice a day  Labs Reviewed: Creat: 1.1 (06/26/2010)    Reviewed HgBA1c results: 6.5 (06/26/2010)  6.7 (04/01/2010) stable overall by hx and exam, ok to continue meds/tx as is   Complete Medication List: 1)  Symbicort 160-4.5 Mcg/act Aero (Budesonide-formoterol fumarate) .... Inhale 2 puff using inhaler twice a day 2)  Omeprazole 20 Mg Tbec (Omeprazole) .... 2 by mouth once daily 3)  Ecotrin Low Strength 81 Mg Tbec (Aspirin) .Marland Kitchen.. 1 by mouth each am 4)  Metformin Hcl 500 Mg Tabs (Metformin hcl) .... Take 1 tablet by mouth twice a day 5)  Clonidine Hcl 0.1 Mg Tabs (Clonidine hcl) .... Take 1 tablet by mouth twice a day 6)  Lovastatin 20 Mg Tabs (Lovastatin) .... Take 2 tablet by mouth once a day in evening 7)  Hydrochlorothiazide 25 Mg Tabs (Hydrochlorothiazide) .... Take 1 tablet by mouth once a day 8)  Prednisone 10 Mg Tabs (Prednisone) .... 1/2 by mouth once daily 9)  Spiriva Handihaler 18 Mcg Caps (Tiotropium bromide monohydrate) .Marland Kitchen.. 1 puff once daily 10)  Tessalon Perles 100 Mg Caps (Benzonatate) .Marland Kitchen.. 1 - 2 by mouth three times a day as needed for cough 11)  Tamsulosin Hcl 0.4 Mg Caps (Tamsulosin hcl) .Marland Kitchen.. 1po once daily 12)  Azithromycin 250 Mg Tabs (Azithromycin) .... 2po qd for 1 day, then 1po qd for 4days, then stop 13)  Prednisone 10 Mg Tabs (Prednisone) .... 4po qd for 3days, then 3po qd for 3days, then 2po qd for 3days, then 1po qd for 3 days, then stop  Patient Instructions: 1)  you had the steroid shot today 2)  Please take all new medications as  prescribed - the prednisone "burst" and  then re-start usual meds (sent to your pharmacy on the computer) 3)  Continue all previous medications as before this visit  4)  Please go to Radiology in the basement level for your X-Ray today  5)  Your EKG was ok today 6)  You will be contacted about the referral(s) to: Echocardiogram, Lung testing (called PFT's) and Dr Sherene Sires evaluation 7)  Please schedule a follow-up appointment in 4 months. Prescriptions: PREDNISONE 10 MG TABS (PREDNISONE) 4po qd for 3days, then 3po qd for 3days, then 2po qd for 3days, then 1po qd for 3 days, then stop  #30 x 0   Entered and Authorized by:   Corwin Levins MD   Signed by:   Corwin Levins MD on 08/20/2010   Method used:   Electronically to        [facility] [phone]* (retail)       [address]       [place], [place]  [address]       Ph: [phone]       Fax: [phone]   RxID:   [id number]    Medication Administration  Injection # 1:    Medication: Depo- Medrol 80mg     Diagnosis: DYSPNEA (ICD-786.05)    Route: IM    Site: R deltoid    Exp Date: 03/22/2013    Lot #: [id number]    Mfr: Pharmacia    Comments: Pt received a total of 120mg     Patient tolerated injection without complications    Given by: Zella Ball Ewing CMA Duncan Dull) (August 20, 2010 12:49 PM)  Injection # 2:    Medication: Depo- Medrol 40mg     Diagnosis: DYSPNEA (ICD-786.05)    Patient tolerated injection without complications    Given by: Zella Ball Ewing CMA Duncan Dull) (August 20, 2010 12:49 PM)  Orders Added: 1)  EKG w/ Interpretation [93000] 2)  Depo- Medrol 80mg  [J1040] 3)  T-2 View CXR, Same Day [71020.5TC] 4)  Misc. Referral [Misc. Ref] 5)  Pulmonary Referral [Pulmonary] 6)  Echo Referral [Echo] 7)  Est. Patient Level IV [24401]

## 2011-01-23 ENCOUNTER — Encounter (HOSPITAL_COMMUNITY): Payer: Self-pay

## 2011-01-23 NOTE — Assessment & Plan Note (Signed)
Summary: 3 MO ROV /NWS  #   Vital Signs:  Patient profile:   65 year old male Height:      76 inches Weight:      218.75 pounds BMI:     26.72 O2 Sat:      94 % on Room air Temp:     98.1 degrees F oral Pulse rate:   106 / minute BP supine:   110 / 74  (right arm) BP sitting:   104 / 70  (right arm) BP standing:   100 / 72  (right arm) Cuff size:   large  Vitals Entered By: Zella Ball Ewing CMA (AAMA) (January 16, 2011 1:30 PM)  O2 Flow:  Room air CC: 3 month ROV/RE   Primary Care Provider:  [name], MD  CC:  3 month ROV/RE.  History of Present Illness: here for f/u - "just have no energy,", "cant walk, cant excercise, hard to get OOB in the AM", low appetitie, little to no motivation,  but denies anhedonia or suicidal ideation or depressed;  just no energy to do things as he used to and still wants to;  Pt denies CP, worsening sob at rest,  wheezing, orthopnea, pnd, worsening LE edema, palps, but has some  dizziness nd near syncope waiting in line one day at [employer] several months ago;  also c/o significant  DOE , worse in the past wk and now here today. Does not tend to drink much fluids.  Still on diuretic  Pt denies new neuro symptoms such as headache, facial or extremity weakness  Pt denies polydipsia, polyuria   Overall good compliance with meds, trying to follow low chol  diet, wt stable, little excercise however No fever, wt loss, night sweats, loss of appetite or other constitutional symptoms .  CBG's in the lower 100's. Denies worsening depressive symptoms, suicidal ideation, or panic.   Overall good compliance with meds, and good tolerability.   Problems Prior to Update: 1)  Syncope  (ICD-780.2) 2)  Fatigue  (ICD-780.79) 3)  Dyspnea On Exertion  (ICD-786.09) 4)  Blurred Vision  (ICD-368.8) 5)  Thrush  (ICD-112.0) 6)  Muscle Cramps  (ICD-729.82) 7)  Smoker  (ICD-305.1) 8)  Dyspnea  (ICD-786.05) 9)  Psa, Increased  (ICD-790.93) 10)  Epididymo-orchitis  (ICD-604.90) 11)   Acute Prostatitis  (ICD-601.0) 12)  Preventive Health Care  (ICD-V70.0) 13)  Wheezing  (ICD-786.07) 14)  Thumb Pain, Left  (ICD-729.5) 15)  Abnormal Electrocardiogram  (ICD-794.31) 16)  Fatigue  (ICD-780.79) 17)  Hand Pain, Left  (ICD-729.5) 18)  Thrush  (ICD-112.0) 19)  Mass, Superficial  (ICD-782.2) 20)  Benign Prostatic Hypertrophy  (ICD-600.00) 21)  Nocturia  (ICD-788.43) 22)  Chronic Obstructive Pulmonary Disease, Acute Exacerbation  (ICD-491.21) 23)  Special Screening Malignant Neoplasm of Prostate  (ICD-V76.44) 24)  Fatigue  (ICD-780.79) 25)  Thrush  (ICD-112.0) 26)  Chronic Obstructive Pulmonary Disease, Acute Exacerbation  (ICD-491.21) 27)  Diverticular Disease  (ICD-562.10) 28)  Adenomatous Colonic Polyp  (ICD-211.3) 29)  Skin Lesion  (ICD-709.9) 30)  Pulmonary Fibrosis  (ICD-515) 31)  Dyspnea/shortness of Breath  (ICD-786.09) 32)  Fatigue  (ICD-780.79) 33)  Family History Diabetes 1st Degree Relative  (ICD-V18.0) 34)  Lumbar Disc Disorder  (ICD-722.93) 35)  Allergic Rhinitis  (ICD-477.9) 36)  Erectile Dysfunction, Organic, 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 Take  One 30-60 Min Before First Meal of The Day 2)  Ecotrin Low Strength 81 Mg  Tbec (Aspirin) .Marland Kitchen.. 1 By Mouth Each Am 3)  Metformin Hcl 500 Mg Tabs (Metformin Hcl) .... Take 1 Tablet By Mouth Twice A Day 4)  Clonidine Hcl 0.1 Mg  Tabs (Clonidine Hcl) .... One Half Twice Daily 5)  Lipitor 20 Mg Tabs (Atorvastatin Calcium) .Marland Kitchen.. 1 By Mouth Once Daily  - Generic 6)  Hydrochlorothiazide 25 Mg  Tabs (Hydrochlorothiazide) .... Take 1 Tablet By Mouth Once A Day 7)  Prednisone 10 Mg  Tabs (Prednisone) .... 1/2 By Mouth Once Daily 8)  Tamsulosin Hcl 0.4 Mg Caps  (Tamsulosin Hcl) .Marland Kitchen.. 1po Once Daily 9)  Symbicort 160-4.5 Mcg/act Aero (Budesonide-Formoterol Fumarate) .... 2 Puffs Two Times A Day 10)  Nystatin 100000 Unit/ml Susp (Nystatin) .... Use Asd 5 Cc Swish and Spit Four Times Per Day For 10 Days 11)  Levitra 20 Mg Tabs (Vardenafil Hcl) .Marland Kitchen.. 1po Once Daily  Current Medications (verified): 1)  Omeprazole 20 Mg Tbec (Omeprazole) .... Take  One 30-60 Min Before First Meal of The Day 2)  Ecotrin Low Strength 81 Mg  Tbec (Aspirin) .Marland Kitchen.. 1 By Mouth Each Am 3)  Metformin Hcl 500 Mg Tabs (Metformin Hcl) .... Take 1 Tablet By Mouth Twice A Day 4)  Clonidine Hcl 0.1 Mg  Tabs (Clonidine Hcl) .... One Half Twice Daily 5)  Lipitor 20 Mg Tabs (Atorvastatin Calcium) .Marland Kitchen.. 1 By Mouth Once Daily  - Generic 6)  Amlodipine Besylate 2.5 Mg Tabs (Amlodipine Besylate) .Marland Kitchen.. 1po Once Daily 7)  Prednisone 10 Mg  Tabs (Prednisone) .... 1/2 By Mouth Once Daily 8)  Tamsulosin Hcl 0.4 Mg Caps (Tamsulosin Hcl) .Marland Kitchen.. 1po Once Daily 9)  Symbicort 160-4.5 Mcg/act Aero (Budesonide-Formoterol Fumarate) .... 2 Puffs Two Times A Day 10)  Nystatin 100000 Unit/ml Susp (Nystatin) .... Use Asd 5 Cc Swish and Spit Four Times Per Day For 10 Days 11)  Levitra 20 Mg Tabs (Vardenafil Hcl) .Marland Kitchen.. 1po Once Daily  Allergies (verified): 1)  ! Lisinopril (Lisinopril) 2)  ! Ace Inhibitors  Past History:  Past Medical History: Last updated: 10/10/2010 COPD............................................Marland KitchenWert   -  2009  FEV1 baseline of 1.8  >  course of prednisone with an FEV1 up to 2.45 with a ratio of 57%    - ? 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 Allergic rhinitis Benign prostatic hypertrophy  Past Surgical History: Last updated: 11/21/2009 back surgery x 4 - disabled after 2003  Family  History: Last updated: 08/30/2010 Family History Diabetes 1st degree relative: Mother Family History Hypertension No FH of Colon Cancer: Negative for respiratory diseases or atopy   Social History: Last updated: 11/23/2009 Current Smoker Alcohol use-yes Patient does not get regular exercise.  Occupation: Disabled Daily Caffeine Use Illicit Drug Use - no  Risk Factors: Exercise: no (11/21/2009)  Risk Factors: Smoking Status: current (10/10/2010) Packs/Day: 0.5 (08/30/2010)  Review of Systems  The patient denies anorexia, fever, vision loss, decreased hearing, hoarseness, chest pain, peripheral edema, prolonged cough, headaches, hemoptysis, abdominal pain, melena, hematochezia, severe indigestion/heartburn, hematuria, muscle weakness, suspicious skin lesions, transient blindness, depression, unusual weight  change, abnormal bleeding, enlarged lymph nodes, and angioedema.         all otherwise negative per pt -    Physical Exam  General:  alert and overweight-appearing.   Head:  normocephalic and atraumatic.   Eyes:  vision grossly intact, pupils equal, and pupils round.   Ears:  R ear normal and L ear normal.   Nose:  no external deformity and no nasal discharge.   Mouth:  no gingival abnormalities and pharynx pink and moist. Neck:  supple and no masses.   Lungs:  normal respiratory effort, R decreased breath sounds, and L decreased breath sounds.   Heart:  normal rate and regular rhythm.   Abdomen:  soft, non-tender, and normal bowel sounds.   Msk:  no joint tenderness and no joint swelling, no muscle tender or cramp.   Extremities:  no edema, no erythema  Neurologic:  cranial nerves II-XII intact and strength normal in all extremities.   Skin:  color normal and no rashes.   Psych:  not anxious appearing and not depressed appearing.     Impression & Recommendations:  Problem # 1:  DYSPNEA ON EXERTION (ICD-786.09)  His updated medication list for this problem  includes:    Symbicort 160-4.5 Mcg/act Aero (Budesonide-formoterol fumarate) .Marland Kitchen... 2 puffs two times a day ? anginal equivalent - no other good explanation it seems with w/u so far - will ask for stress CL (also with recent syncope standing in line at [employer]) ; may also be due to volume status as well (see below)  Problem # 2:  FATIGUE (ICD-780.79)  with weakness - really his biggest complaint;  he is adamant his biggest improvement is with the depomedrol IM he gets here and the steroid , and a buddy also is on dexamethasone;  I think ok to stay on his current med, but will refer to endo to r/o adrenal insufficiency in a pt who may be steroid dependent for other reason already; to also check orthostatics today if pt has time  Orders: TLB-BMP (Basic Metabolic Panel-BMET) (80048-METABOL) TLB-CBC Platelet - w/Differential (85025-CBCD) TLB-Hepatic/Liver Function Pnl (80076-HEPATIC) TLB-TSH (Thyroid Stimulating Hormone) (84443-TSH)  Problem # 3:  SYNCOPE (ICD-780.2)  one episode standing in line at [facility] several months ago;  will ask for stress CL as above, also carotid dopplers  Orders: Cardiolite (Cardiolite) Radiology Referral (Radiology)  Problem # 4:  DIABETES MELLITUS, TYPE II (ICD-250.00)  His updated medication list for this problem includes:    Ecotrin Low Strength 81 Mg Tbec (Aspirin) .Marland Kitchen... 1 by mouth each am    Metformin Hcl 500 Mg Tabs (Metformin hcl) .Marland Kitchen... Take 1 tablet by mouth twice a day  Orders: TLB-Lipid Panel (80061-LIPID) TLB-Microalbumin/Creat Ratio, Urine (82043-MALB) TLB-A1C / Hgb A1C (Glycohemoglobin) (83036-A1C)  Labs Reviewed: Creat: 1.1 (06/26/2010)    Reviewed HgBA1c results: 6.5 (06/26/2010)  6.7 (04/01/2010) stable overall by hx and exam, ok to continue meds/tx as is   Problem # 5:  HYPERTENSION (ICD-401.9)  His updated medication list for this problem includes:    Clonidine Hcl 0.1 Mg Tabs (Clonidine hcl) ..... One half twice daily    Amlodipine  Besylate 2.5 Mg Tabs (Amlodipine besylate) .Marland Kitchen... 1po once daily orthostatic by exam today - to stop the HCTZ;  change to amlod 2.5 mg  Complete Medication List: 1)  Omeprazole 20 Mg Tbec (Omeprazole) .... Take  one 30-60 min before first meal of the day 2)  Ecotrin Low Strength 81 Mg Tbec (Aspirin) .Marland Kitchen.. 1 by  mouth each am 3)  Metformin Hcl 500 Mg Tabs (Metformin hcl) .... Take 1 tablet by mouth twice a day 4)  Clonidine Hcl 0.1 Mg Tabs (Clonidine hcl) .... One half twice daily 5)  Lipitor 20 Mg Tabs (Atorvastatin calcium) .Marland Kitchen.. 1 by mouth once daily  - generic 6)  Amlodipine Besylate 2.5 Mg Tabs (Amlodipine besylate) .Marland Kitchen.. 1po once daily 7)  Prednisone 10 Mg Tabs (Prednisone) .... 1/2 by mouth once daily 8)  Tamsulosin Hcl 0.4 Mg Caps (Tamsulosin hcl) .Marland Kitchen.. 1po once daily 9)  Symbicort 160-4.5 Mcg/act Aero (Budesonide-formoterol fumarate) .... 2 puffs two times a day 10)  Nystatin 100000 Unit/ml Susp (Nystatin) .... Use asd 5 cc swish and spit four times per day for 10 days 11)  Levitra 20 Mg Tabs (Vardenafil hcl) .Marland Kitchen.. 1po once daily  Patient Instructions: 1)  stop the fluid pill (hydrochlorothiazide) 2)  start the amlodipine 2.5 mg per day (for blood pressure) 3)  Continue all previous medications as before this visit 4)  You will be contacted about the referral(s) to: endocrinology - Dr Everardo All 5)  You will be contacted about the referral(s) to: stress test, and test for the neck arteries (carotid arteries) to make sure no obstruction 6)  Please go to the Lab in the basement for your blood and/or urine tests today 7)  Please call the number on the [facility] Card for results of your testing  8)  Please schedule a follow-up appointment in 1 months, or sooner if needed Prescriptions: AMLODIPINE BESYLATE 2.5 MG TABS (AMLODIPINE BESYLATE) 1po once daily  #90 x 3   Entered and Authorized by:   Corwin Levins MD   Signed by:   Corwin Levins MD on 01/16/2011   Method used:   Print then Give to Patient    RxID:   [id number]    Orders Added: 1)  Cardiolite [Cardiolite] 2)  Radiology Referral [Radiology] 3)  TLB-BMP (Basic Metabolic Panel-BMET) [80048-METABOL] 4)  TLB-CBC Platelet - w/Differential [85025-CBCD] 5)  TLB-Hepatic/Liver Function Pnl [80076-HEPATIC] 6)  TLB-TSH (Thyroid Stimulating Hormone) [84443-TSH] 7)  TLB-Lipid Panel [80061-LIPID] 8)  TLB-Microalbumin/Creat Ratio, Urine [82043-MALB] 9)  TLB-A1C / Hgb A1C (Glycohemoglobin) [83036-A1C] 10)  Est. Patient Level V [44010]

## 2011-01-23 NOTE — Letter (Signed)
Summary: [facility]   Imported By: Lester Osseo 12/25/2010 11:04:59  _____________________________________________________________________  External Attachment:    Type:   Image     Comment:   External Document

## 2011-01-28 ENCOUNTER — Telehealth (INDEPENDENT_AMBULATORY_CARE_PROVIDER_SITE_OTHER): Payer: Self-pay

## 2011-01-28 ENCOUNTER — Encounter: Payer: Self-pay | Admitting: Internal Medicine

## 2011-01-29 ENCOUNTER — Encounter: Payer: Self-pay | Admitting: Cardiology

## 2011-01-29 ENCOUNTER — Encounter: Payer: Self-pay | Admitting: Internal Medicine

## 2011-01-29 ENCOUNTER — Encounter (INDEPENDENT_AMBULATORY_CARE_PROVIDER_SITE_OTHER): Payer: Medicare Other

## 2011-01-29 ENCOUNTER — Ambulatory Visit (HOSPITAL_COMMUNITY): Payer: Medicare Other | Attending: Internal Medicine

## 2011-01-29 DIAGNOSIS — R55 Syncope and collapse: Secondary | ICD-10-CM

## 2011-01-29 DIAGNOSIS — R0602 Shortness of breath: Secondary | ICD-10-CM

## 2011-01-29 DIAGNOSIS — R079 Chest pain, unspecified: Secondary | ICD-10-CM

## 2011-01-29 DIAGNOSIS — R0609 Other forms of dyspnea: Secondary | ICD-10-CM

## 2011-01-29 DIAGNOSIS — I6529 Occlusion and stenosis of unspecified carotid artery: Secondary | ICD-10-CM

## 2011-01-29 NOTE — Progress Notes (Signed)
Summary: ALT med  Phone Note Call from Patient Call back at [facility] Phone [phone]   Caller: Patient Summary of Call: Pt called stating that BP medication is too expensive. Pt is requesting alternative medication Initial call taken by: Margaret Pyle, CMA,  January 21, 2011 11:04 AM  Follow-up for Phone Call        I think he refers to amlodipine;  I'm not sure what to say, as the medicaiton is $5/mo I believe at [facility] or he could call [facility] or [facility] for their price as well, prob cant get too much less expensive with different med, so I would encourage this Follow-up by: Corwin Levins MD,  January 21, 2011 11:50 AM  Additional Follow-up for Phone Call Additional follow up Details #1::        Pt advised and will try one of the pharmacies advised by JWJ Additional Follow-up by: Margaret Pyle, CMA,  January 21, 2011 4:00 PM

## 2011-02-06 NOTE — Miscellaneous (Signed)
Summary: Orders Update  Clinical Lists Changes  Orders: Added new Test order of Carotid Duplex (Carotid Duplex) - Signed 

## 2011-02-06 NOTE — Progress Notes (Signed)
Summary: Nuc. Pre-Procedure  Phone Note Outgoing Call Call back at [facility] Phone [phone]   Call placed by: Irean Hong, RN,  January 28, 2011 2:31 PM Summary of Call: Reviewed information on Myoview Information Sheet (see scanned document for further details).  Spoke with patient per Allen Kell, CNMT. Richard Isadore,RN.     Nuclear Med Background Indications for Stress Test: Evaluation for Ischemia   History: Abnormal EKG, COPD, Echo  History Comments: 9/11 Echo: EF=60-65%, mild LVH.  Symptoms: Dizziness, DOE, Fatigue, Fatigue with Exertion, Near Syncope    Nuclear Pre-Procedure Cardiac Risk Factors: Hypertension, Lipids, NIDDM, Smoker Height (in): 76

## 2011-02-06 NOTE — Assessment & Plan Note (Signed)
Summary: Cardiology Nuclear Testing  Nuclear Med Background Indications for Stress Test: Evaluation for Ischemia   History: Abnormal EKG, COPD, Echo  History Comments: 9/11 Echo: EF=60-65%, mild LVH.  Symptoms: Dizziness, DOE, Fatigue, Fatigue with Exertion, Near Syncope, Palpitations, SOB    Nuclear Pre-Procedure Cardiac Risk Factors: Hypertension, Lipids, NIDDM, Smoker Caffeine/Decaff Intake: None NPO After: 10:30 AM Lungs: clear IV 0.9% NS with Angio Cath: 20g     IV Site: R Antecubital IV Started by: Bonnita Levan, RN Chest Size (in) 46     Height (in): 76 Weight (lb): 216 BMI: 26.39 Tech Comments: Patient had a vaso-vagal past IV insertion, BP dropped to 70/40, HR 54, nauseous, diaphoretic. BS- 103, 250cc NS IV Fluids given. O2 Sats 97%. Symptoms resolved within 15 minutes, BP 120/60.  Nuclear Med Study 1 or 2 day study:  1 day     Stress Test Type:  Stress Reading MD:  Marca Ancona, MD     Referring MD:  J.John Resting Radionuclide:  Technetium 26m Tetrofosmin     Resting Radionuclide Dose:  10.9 mCi  Stress Radionuclide:  Technetium 76m Tetrofosmin     Stress Radionuclide Dose:  33 mCi   Stress Protocol Exercise Time (min):  5:14 min     Max HR:  132 bpm     Predicted Max HR:  154 bpm  Max Systolic BP: 208 mm Hg     Percent Max HR:  85.71 %     METS: 5.10 Rate Pressure Product:  16109    Stress Test Technologist:  Milana Na, EMT-P     Nuclear Technologist:  Domenic Polite, CNMT  Rest Procedure  Myocardial perfusion imaging was performed at rest 45 minutes following the intravenous administration of Technetium 47m Tetrofosmin.  Stress Procedure  The patient exercised for 5:14. The patient stopped due to fatigue, sob, and chest pressure.  There were non significant ST-T wave changes and rare pvcs.  Technetium 66m Tetrofosmin was injected at peak exercise and myocardial perfusion imaging was performed after a brief delay.  QPS Raw Data Images:  Normal; no  motion artifact; normal heart/lung ratio. Stress Images:  Basal inferior perfusion defect.  Rest Images:  Basal inferior perfusion defect.  Subtraction (SDS):  Fixed basal inferior perfusion defect.  Transient Ischemic Dilatation:  .90  (Normal <1.22)  Lung/Heart Ratio:  .40  (Normal <0.45)  Quantitative Gated Spect Images QGS EDV:  109 ml QGS ESV:  46 ml QGS EF:  58 % QGS cine images:  Normal wall motion.    Overall Impression  Exercise Capacity: Lexiscan with low level exercise BP Response: Hypertensive blood pressure response. Clinical Symptoms: Chest pain and dyspnea.  ECG Impression: Insignificant upsloping ST segment depression. Overall Impression: Fixed basal inferior perfusion defect with normal wall motion.  This is most suggestive of diaphragmatic attenuation.  Overall Impression Comments: Probably normal study.   Appended Document: Cardiology Nuclear Testing LMOPT - labs negative, normal, or stable

## 2011-02-13 ENCOUNTER — Telehealth: Payer: Self-pay | Admitting: Internal Medicine

## 2011-02-18 ENCOUNTER — Ambulatory Visit (INDEPENDENT_AMBULATORY_CARE_PROVIDER_SITE_OTHER): Payer: Medicare Other | Admitting: Internal Medicine

## 2011-02-18 ENCOUNTER — Encounter: Payer: Self-pay | Admitting: Internal Medicine

## 2011-02-18 DIAGNOSIS — I10 Essential (primary) hypertension: Secondary | ICD-10-CM

## 2011-02-18 DIAGNOSIS — J4489 Other specified chronic obstructive pulmonary disease: Secondary | ICD-10-CM

## 2011-02-18 DIAGNOSIS — E119 Type 2 diabetes mellitus without complications: Secondary | ICD-10-CM

## 2011-02-18 DIAGNOSIS — E785 Hyperlipidemia, unspecified: Secondary | ICD-10-CM

## 2011-02-18 DIAGNOSIS — J449 Chronic obstructive pulmonary disease, unspecified: Secondary | ICD-10-CM

## 2011-02-18 NOTE — Progress Notes (Signed)
Summary: Rx refill req  Phone Note Call from Patient Call back at Home Phone [phone]   Caller: Patient Summary of Call: Pt called to inquire why his Rx for Prednisone was denied. According to pharmacy pt had filled entire Rx, sometimes every 15 days. Pt states that he sometimes took a whole tablet because he was feeling bad, and had "no energy'. Pt is requesting refill, okay to fill? Initial call taken by: Margaret Pyle, CMA,  February 13, 2011 1:09 PM  Follow-up for Phone Call        pt should not be taking the prednisone more than prescribed, due to risk and long term side effects  no early refills please Follow-up by: Corwin Levins MD,  February 13, 2011 1:16 PM  Additional Follow-up for Phone Call Additional follow up Details #1::        Pt advised and expressed understanding Additional Follow-up by: Margaret Pyle, CMA,  February 13, 2011 2:38 PM

## 2011-02-27 NOTE — Assessment & Plan Note (Signed)
Summary: 1 MO FU /NWS   Vital Signs:  Patient profile:   65 year old male Height:      76 inches Weight:      214.50 pounds BMI:     26.20 O2 Sat:      94 % Temp:     98.6 degrees F oral Pulse rate:   89 / minute BP sitting:   110 / 72  (left arm) Cuff size:   large  Vitals Entered By: Zella Ball Ewing CMA Duncan Dull) (February 18, 2011 10:26 AM) CC: 1 month followup/RE   Primary Care Provider:  [name], MD  CC:  1 month followup/RE.  History of Present Illness: here to f/u - could not afford the amlodipine (or the lipitor as well) so went back to HCTZ at HALF the 25 mg,  BP now well controlled and strength and stamina markedly improved; Pt denies CP, worsening sob, doe, wheezing, orthopnea, pnd, worsening LE edema, palps, dizziness or syncope  Pt denies new neuro symptoms such as headache, facial or extremity weakness  Pt denies polydipsia, polyuria, or low sugar symptoms such as shakiness improved with eating.  Overall good compliance with meds, trying to follow low chol, DM diet, wt stable, little excercise however  CBG;s in the lower 100's.  Overall good compliance with meds, and good tolerability.  No fever, wt loss, night sweats, loss of appetite or other constitutional symptoms  Denies worsening depressive symptoms, suicidal ideation, or panic.    Problems Prior to Update: 1)  Syncope  (ICD-780.2) 2)  Fatigue  (ICD-780.79) 3)  Dyspnea On Exertion  (ICD-786.09) 4)  Blurred Vision  (ICD-368.8) 5)  Thrush  (ICD-112.0) 6)  Muscle Cramps  (ICD-729.82) 7)  Smoker  (ICD-305.1) 8)  Dyspnea  (ICD-786.05) 9)  Psa, Increased  (ICD-790.93) 10)  Epididymo-orchitis  (ICD-604.90) 11)  Acute Prostatitis  (ICD-601.0) 12)  Preventive Health Care  (ICD-V70.0) 13)  Wheezing  (ICD-786.07) 14)  Thumb Pain, Left  (ICD-729.5) 15)  Abnormal Electrocardiogram  (ICD-794.31) 16)  Fatigue  (ICD-780.79) 17)  Hand Pain, Left  (ICD-729.5) 18)  Thrush  (ICD-112.0) 19)  Mass, Superficial  (ICD-782.2) 20)   Benign Prostatic Hypertrophy  (ICD-600.00) 21)  Nocturia  (ICD-788.43) 22)  Chronic Obstructive Pulmonary Disease, Acute Exacerbation  (ICD-491.21) 23)  Special Screening Malignant Neoplasm of Prostate  (ICD-V76.44) 24)  Fatigue  (ICD-780.79) 25)  Thrush  (ICD-112.0) 26)  Chronic Obstructive Pulmonary Disease, Acute Exacerbation  (ICD-491.21) 27)  Diverticular Disease  (ICD-562.10) 28)  Adenomatous Colonic Polyp  (ICD-211.3) 29)  Skin Lesion  (ICD-709.9) 30)  Pulmonary Fibrosis  (ICD-515) 31)  Dyspnea/shortness of Breath  (ICD-786.09) 32)  Fatigue  (ICD-780.79) 33)  Family History Diabetes 1st Degree Relative  (ICD-V18.0) 34)  Lumbar Disc Disorder  (ICD-722.93) 35)  Allergic Rhinitis  (ICD-477.9) 36)  Erectile Dysfunction, Organic, 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 Take  One 30-60 Min Before First Meal of The Day 2)  Ecotrin Low Strength 81 Mg  Tbec (Aspirin) .Marland Kitchen.. 1 By Mouth Each Am 3)  Metformin Hcl 500 Mg Tabs (Metformin Hcl) .... Take 1 Tablet By Mouth Twice A Day 4)  Clonidine Hcl 0.1 Mg  Tabs (Clonidine Hcl) .... One Half Twice Daily 5)  Lipitor  20 Mg Tabs (Atorvastatin Calcium) .Marland Kitchen.. 1 By Mouth Once Daily  - Generic 6)  Amlodipine Besylate 2.5 Mg Tabs (Amlodipine Besylate) .Marland Kitchen.. 1po Once Daily 7)  Prednisone 10 Mg  Tabs (Prednisone) .... 1/2 By Mouth Once Daily 8)  Tamsulosin Hcl 0.4 Mg Caps (Tamsulosin Hcl) .Marland Kitchen.. 1po Once Daily 9)  Symbicort 160-4.5 Mcg/act Aero (Budesonide-Formoterol Fumarate) .... 2 Puffs Two Times A Day 10)  Nystatin 100000 Unit/ml Susp (Nystatin) .... Use Asd 5 Cc Swish and Spit Four Times Per Day For 10 Days 11)  Levitra 20 Mg Tabs (Vardenafil Hcl) .Marland Kitchen.. 1po Once Daily  Current Medications (verified): 1)   Omeprazole 20 Mg Tbec (Omeprazole) .... Take  One 30-60 Min Before First Meal of The Day 2)  Ecotrin Low Strength 81 Mg  Tbec (Aspirin) .Marland Kitchen.. 1 By Mouth Each Am 3)  Metformin Hcl 500 Mg Tabs (Metformin Hcl) .... Take 1 Tablet By Mouth Twice A Day 4)  Clonidine Hcl 0.1 Mg  Tabs (Clonidine Hcl) .... One Half Twice Daily 5)  Lovastatin 20 Mg Tabs (Lovastatin) .Marland Kitchen.. 1po Once Daily 6)  Hydrochlorothiazide 12.5 Mg Caps (Hydrochlorothiazide) .Marland Kitchen.. 1 By Mouth Once Daily 7)  Prednisone 10 Mg  Tabs (Prednisone) .... 1/2 By Mouth Once Daily 8)  Tamsulosin Hcl 0.4 Mg Caps (Tamsulosin Hcl) .Marland Kitchen.. 1po Once Daily 9)  Symbicort 160-4.5 Mcg/act Aero (Budesonide-Formoterol Fumarate) .... 2 Puffs Two Times A Day 10)  Nystatin 100000 Unit/ml Susp (Nystatin) .... Use Asd 5 Cc Swish and Spit Four Times Per Day For 10 Days 11)  Levitra 20 Mg Tabs (Vardenafil Hcl) .Marland Kitchen.. 1po Once Daily  Allergies (verified): 1)  ! Lisinopril (Lisinopril) 2)  ! Ace Inhibitors  Past History:  Past Medical History: Last updated: 10/10/2010 COPD............................................Marland KitchenWert   -  2009  FEV1 baseline of 1.8  >  course of prednisone with an FEV1 up to 2.45 with a ratio of 57%    - ? 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 Allergic rhinitis Benign prostatic hypertrophy  Past Surgical History: Last updated: 11/21/2009 back surgery x 4 - disabled after 2003  Social History: Last updated: 11/23/2009 Current Smoker Alcohol use-yes Patient does not get regular exercise.  Occupation: Disabled Daily Caffeine Use Illicit Drug Use - no  Risk Factors: Exercise: no (11/21/2009)  Risk Factors: Smoking Status: current (10/10/2010) Packs/Day: 0.5 (08/30/2010)  Review of Systems       all otherwise negative per pt -     Physical Exam  General:  alert and overweight-appearing.   Head:  normocephalic and atraumatic.   Eyes:  vision grossly intact, pupils equal, and pupils round.   Ears:  R ear normal and L ear normal.   Nose:  no external deformity and no nasal discharge.   Mouth:  no gingival abnormalities and pharynx pink and moist. Neck:  supple and no masses.   Lungs:  normal respiratory effort, R decreased breath sounds, and L decreased breath sounds.   Heart:  normal rate and regular rhythm.   Extremities:  no edema, no erythema    Impression & Recommendations:  Problem # 1:  HYPERTENSION (ICD-401.9)  His updated medication list for this problem includes:    Clonidine Hcl 0.1 Mg Tabs (Clonidine hcl) ..... One half twice daily  Hydrochlorothiazide 12.5 Mg Caps (Hydrochlorothiazide) .Marland Kitchen... 1 by mouth once daily  BP today: 110/72 Prior BP: 100/72 (01/16/2011)  Labs Reviewed: K+: 3.7 (01/16/2011) Creat: : 1.3 (01/16/2011)   Chol: 195 (01/16/2011)   HDL: 45.50 (01/16/2011)   LDL: 86 (06/26/2010)   TG: 219.0 (01/16/2011) stable overall by hx and exam, ok to continue meds/tx as is , to f/u BP at home and next visit  Problem # 2:  HYPERLIPIDEMIA (ICD-272.4)  His updated medication list for this problem includes:    Lovastatin 20 Mg Tabs (Lovastatin) .Marland Kitchen... 1po once daily ok to change to lovastatin on the $4 list as this is more affordable   Labs Reviewed: SGOT: 18 (01/16/2011)   SGPT: 12 (01/16/2011)   HDL:45.50 (01/16/2011), 70.50 (06/26/2010)  LDL:86 (06/26/2010), 95 (04/01/2010)  Chol:195 (01/16/2011), 188 (06/26/2010)  Trig:219.0 (01/16/2011), 158.0 (06/26/2010)  Problem # 3:  DIABETES MELLITUS, TYPE II (ICD-250.00)  His updated medication list for this problem includes:    Ecotrin Low Strength 81 Mg Tbec (Aspirin) .Marland Kitchen... 1 by mouth each am    Metformin Hcl 500 Mg Tabs (Metformin hcl) .Marland Kitchen... Take 1 tablet by mouth twice a day  Labs Reviewed: Creat: 1.3 (01/16/2011)    Reviewed  HgBA1c results: 6.5 (01/16/2011)  6.5 (06/26/2010) stable overall by hx and exam, ok to continue meds/tx as is , Pt to cont DM diet, excercise, wt control efforts; to check labs next visit per pt  Problem # 4:  COPD (ICD-496)  His updated medication list for this problem includes:    Symbicort 160-4.5 Mcg/act Aero (Budesonide-formoterol fumarate) .Marland Kitchen... 2 puffs two times a day stable overall by hx and exam, ok to continue meds/tx as is   Pulmonary Functions Reviewed: O2 sat: 94 (02/18/2011)     Vaccines Reviewed: Pneumovax: Pneumovax (02/14/2010)   Flu Vax: Fluvax 3+ (10/15/2010)  Complete Medication List: 1)  Omeprazole 20 Mg Tbec (Omeprazole) .... Take  one 30-60 min before first meal of the day 2)  Ecotrin Low Strength 81 Mg Tbec (Aspirin) .Marland Kitchen.. 1 by mouth each am 3)  Metformin Hcl 500 Mg Tabs (Metformin hcl) .... Take 1 tablet by mouth twice a day 4)  Clonidine Hcl 0.1 Mg Tabs (Clonidine hcl) .... One half twice daily 5)  Lovastatin 20 Mg Tabs (Lovastatin) .Marland Kitchen.. 1po once daily 6)  Hydrochlorothiazide 12.5 Mg Caps (Hydrochlorothiazide) .Marland Kitchen.. 1 by mouth once daily 7)  Prednisone 10 Mg Tabs (Prednisone) .... 1/2 by mouth once daily 8)  Tamsulosin Hcl 0.4 Mg Caps (Tamsulosin hcl) .Marland Kitchen.. 1po once daily 9)  Symbicort 160-4.5 Mcg/act Aero (Budesonide-formoterol fumarate) .... 2 puffs two times a day 10)  Nystatin 100000 Unit/ml Susp (Nystatin) .... Use asd 5 cc swish and spit four times per day for 10 days 11)  Levitra 20 Mg Tabs (Vardenafil hcl) .Marland Kitchen.. 1po once daily  Patient Instructions: 1)  stop the amlodipine 2)  start the Hydrochlorothiazide at 12.5 mg per day 3)  stop the lipitor 4)  start the lovastatin 20 mg per day  5)  Continue all previous medications as before this visit  6)  Please schedule a follow-up appointment in 6 months. Prescriptions: LOVASTATIN 20 MG TABS (LOVASTATIN) 1po once daily  #90 x 3   Entered and Authorized by:   Corwin Levins MD   Signed by:   Corwin Levins  MD on 02/18/2011   Method used:   Print then Give to Patient   RxID:   [id number] PREDNISONE 10 MG  TABS (PREDNISONE) 1/2  by mouth once daily  #45 x 3   Entered and Authorized by:   Corwin Levins MD   Signed by:   Corwin Levins MD on 02/18/2011   Method used:   Print then Give to Patient   RxID:   [id number] HYDROCHLOROTHIAZIDE 12.5 MG CAPS (HYDROCHLOROTHIAZIDE) 1 by mouth once daily  #90 x 3   Entered and Authorized by:   Corwin Levins MD   Signed by:   Corwin Levins MD on 02/18/2011   Method used:   Print then Give to Patient   RxID:   [id number]    Orders Added: 1)  Est. Patient Level IV [56433]

## 2011-03-26 LAB — GLUCOSE, CAPILLARY: Glucose-Capillary: 97 mg/dL (ref 70–99)

## 2011-04-24 ENCOUNTER — Telehealth: Payer: Self-pay

## 2011-04-24 NOTE — Telephone Encounter (Signed)
A user error has taken place: encounter opened in error, closed for administrative reasons.

## 2011-05-05 ENCOUNTER — Other Ambulatory Visit: Payer: Self-pay | Admitting: Internal Medicine

## 2011-05-05 ENCOUNTER — Encounter: Payer: Self-pay | Admitting: Internal Medicine

## 2011-05-05 ENCOUNTER — Telehealth: Payer: Self-pay

## 2011-05-05 DIAGNOSIS — R0602 Shortness of breath: Secondary | ICD-10-CM

## 2011-05-05 DIAGNOSIS — Z0001 Encounter for general adult medical examination with abnormal findings: Secondary | ICD-10-CM | POA: Insufficient documentation

## 2011-05-05 NOTE — Telephone Encounter (Signed)
A user error has taken place: encounter opened in error, closed for administrative reasons.

## 2011-05-06 NOTE — Assessment & Plan Note (Signed)
[name]                             PULMONARY OFFICE NOTE   Richard Hopkins, Richard Hopkins                     MRN:          [id number]  DATE:05/12/2007                            DOB:          1946/07/17    ADDENDUM:   IMPRESSION:  Chronic obstructive pulmonary disease with active asthmatic  component being fueled at least partly by smoking and possibly also  partly by reflux.  Note, the absence of purulent sputum or obvious sinus  complaints, which would be more typical of a primary airways disease.  I  therefore recommended the following.  1. Continue Symbicort 160/4.5 two puffs b.i.d. since apparently he has      failed Advair.  2. Continue to take Prilosec daily, but also add dietary constraints,      which include the avoidance of mint in all types.  3. Return here in 4 weeks for PFTs at this point off of prednisone to      see if he indeed flares.  4. I spent extra time teaching him optimal MDI technique, which he      mastered fairly quickly.  5. Add Spiriva 1 capsule daily (because he says even when he is      between flares, he does not have the wind he used to.  6. Followup in 4 weeks for PFTs, sooner if he exacerbates off of      prednisone again.     Charlaine Dalton. Sherene Sires, MD, [facility]  Electronically Signed    MBW/MedQ  DD: 05/12/2007  DT: 05/12/2007  Job #: [id number]

## 2011-05-06 NOTE — Assessment & Plan Note (Signed)
[facility]                             PULMONARY OFFICE NOTE   Richard Hopkins, Richard Hopkins                     MRN:          [id number]  DATE:10/28/2007                            DOB:          12/17/1946    Date of Service:  October 28, 2007   HISTORY:  This is a 65 year old black male who denies any significant  dyspnea and in fact, is overall improved since he cut down on smoking,  now is down to half a pack per day with improving exercise tolerance.  He has minimum cough with no purulent sputum production, fevers, chills,  sweats , or exertional chest pain, orthopnea, PND, or leg swelling.   For full inventory of medication, please see face sheet dated October 28, 2007, correct as listed.   This is a 65 year old black male with a history of interstitial lung  disease here for follow-up, PFTs, and chest x-ray.   PHYSICAL EXAMINATION:  He is a pleasant, ambulatory black man in no  acute distress.  Stable vital signs.  HEENT:  Unremarkable.  Pharynx clear.  LUNGS:  Lung fields reveal few crackles in the bases with minimal  rhonchi on expiration.  HEART:  There is a regular rhythm without murmur, gallop, or rub.  ABDOMEN:  Soft, benign.  EXTREMITIES:  Warm without calf tenderness, cyanosis, clubbing, or  edema.   PFT's show slight reduction in FEV-1 from 2.46 five months to 2.24 with  a ratio of slightly less as well from 57 down to 53%.  There was no  significant improvement after bronchodilators.  Diffuse capacity was the  same.   Chest x-ray is pending.   IMPRESSION:  Chronic obstructive pulmonary disease, moderate in  severity, associated with pulmonary fibrosis, typical of respiratory  bronchiolitis and interstitial lung disease.  The best way to prove this  right or wrong is for the patient to stop smoking and see what happens  over time.  The only other option is open lung biopsy which I discussed  with the patient  in detail today  during a 15-25 minute visit letting  him know what he was up against in terms of options, neither of which  are really all that appealing to him.  Namely, I could not get him to  commit to quit smoking.  On the other hand he is not interested in open  lung biopsy at this point.   He said he would continue to work on it as much as possible but not yet  ready to quit completely.   FOLLOWUP:  Because his nerves just get too bad (I have asked him to  discuss this directly with Dr. [name] in terms of considering other  options such as Chantix or Wellbutrin).   Follow-up will be every six months, sooner if needed.     Charlaine Dalton. Sherene Sires, MD, [facility]  Electronically Signed    MBW/MedQ  DD: 10/28/2007  DT: 10/28/2007  Job #: [id number]

## 2011-05-06 NOTE — Assessment & Plan Note (Signed)
[facility]                             PULMONARY OFFICE NOTE   Richard Hopkins, Richard Hopkins                     MRN:          [id number]  DATE:06/09/2007                            DOB:          [birth year]    HISTORY:  This is a 65 year old black male, still actively smoking with  COPD with an FEV1 baseline of 1.8 who now returns after recent course of  prednisone with an FEV1 up to 2.45 with a ratio of 57%, still consistent  with moderate airflow obstruction. He says he has felt the best he has  felt in a long time, but notes that he just finished prednisone two days  ago.   He and I misunderstood each other in terms of medication reconciliation  and although it states he was taking Symbicort on his previous visit, it  turns out he is taking Spiriva one daily with Proventil twice daily  (note this is not p.r.n.). The more I talked to him, the more confused I  became about exactly what he is taking.   PHYSICAL EXAMINATION:  He is a stoic, ambulatory, white male in no acute  distress. He has stable vital signs.  HEENT: Is unremarkable. Oropharynx is clear.  LUNGS: Lung fields reveal diminished breath sounds without wheezing.  HEART: Regular rate and rhythm without murmur, gallop or rub.  ABDOMEN: Soft, benign.  EXTREMITIES: Warm without calf tenderness, cyanosis, clubbing or edema.   PFTs as above.   IMPRESSION:  This patient has chronic obstructive pulmonary disease  certainly, but there is a significant asthmatic component with a best  day FEV1 of 70% predicted so the key is to maximize the reversible  component here instead of dwell on the minimal irreversible component.   I spent an extra 15-20 minutes in a 25 minute visit going over the  following issues with the patient:  1. First and foremost, he should not smoke. I understand one of the      reasons he is not compliant with his medications is he says he      cannot afford them, but on the  other hand he can afford to smoke. I      have told him the decision to smoke, but not pay for medications      shows extremely limited insight as well as motivation and I asked      him to look at the big picture in terms of his health care costs      emphasizing that if he stopped smoking it would not only save the      direct cost of smoking, but also all the indirect costs on      healthcare.  2. In the meantime, to give him maximum reversibility, I have      recommended starting back on Symbicort 160/4.5 two puff b.i.d. and      stopping Spiriva at this point and switching to Proventil from      b.i.d. dosing (noting that he interchanged Proventil and Symbicort      for reasons that are not clear to me) and  using the Proventil only      two puffs every 4 hours p.r.n.  3. Before he left, I made sure he could use MDI effectively and he      approached 75% with teaching.  4. Before we do anything further, I need to have him return here      within the next two weeks for a trust but verify visit when we      actually document exactly what he is taking and consider if      cost continues to be an issue switching him over to nebulized      budesonide and Formoterol rather than the [facility] Symbicort since this      is available on Part B Medicare.     Charlaine Dalton. Sherene Sires, MD, [facility]  Electronically Signed    MBW/MedQ  DD: 06/09/2007  DT: 06/09/2007  Job #: [id number]   cc:   Corwin Levins, MD

## 2011-05-06 NOTE — Assessment & Plan Note (Signed)
[facility]                             PULMONARY OFFICE NOTE   JENNER, ROSIER                     MRN:          [id number]  DATE:07/15/2007                            DOB:          01-25-1946    HISTORY OF PRESENT ILLNESS:  The patient is a 65 year old African-  American male, patient of Dr. Thurston Hole with a known history of COPD with  an asthmatic component.  He returns today for a one-month followup.  The  patient returns today for a complete medication review.  The patient  does complain over the last week he has had some intermittent dry cough,  without any purulent sputum.  On review of his medications, the patient  has recently had a blood pressure medication change, in which he was  changed over from Benicar to benazepril by his primary care physician,  secondary to cough issues.  Unfortunately, the patient has had a history  of difficulty to take ACE inhibitors, with recurrent cough and wheezing.  The patient denies any hemoptysis, purulent sputum, fever, chest pain,  orthopnea, PND or leg swelling.   PAST MEDICAL HISTORY:  Reviewed.   PHYSICAL EXAMINATION:  The patient is a pleasant male in no acute  distress.  He is afebrile with stable vital signs.  O2 saturations were  96% on room air.  HEENT is unremarkable.  Neck is supple without  cervical adenopathy.  No JVD.  Lung sounds revealed some coarse breath  sounds without any wheezing or crackles.  Cardiac regular rate.  Abdomen  is soft and nontender. Extremities were warm without any edema.   IMPRESSION AND PLAN:  1. 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      Recommended smoking cessation for this patient, and will continue      on Symbicort 160/5 2 puffs twice daily.  Have recommended that he      change off of his ACE inhibitor over to Benicar, due to the      potential for upper airway irritation.  Samples were given.  The      patient will return  back here in 3-4 weeks with Dr. Sherene Sires, or      sooner.  2. COMPLEX MEDICATION REGIMEN.  The patient's medications were      reviewed in detail.  The patient is take to his [place];      computerized medication calendar was completed for this patient and      reviewed in detail.      Rubye Oaks, NP  Electronically Signed      Charlaine Dalton. Sherene Sires, MD, [facility]  Electronically Signed   TP/MedQ  DD: 07/20/2007  DT: 07/21/2007  Job #: [id number]

## 2011-05-06 NOTE — Assessment & Plan Note (Signed)
[facility]                             PULMONARY OFFICE NOTE   JERAY, SHUGART                     MRN:          [id number]  DATE:05/12/2007                            DOB:          August 28, 1946    CHIEF COMPLAINT:  Dyspnea.   HISTORY:  A 65 year old black male, actively smoking with baseline low  energy for years, for which he has to stop activities after 5 minutes,  more due to fatigue than due to dyspnea.  I had previously seen him in  consultation with COPD with an asthmatic component with a baseline FEV-1  of around 1.8, and chronically elevated left hemidiaphragm since 1998.  He was supposed to maintain on Advair, but was switched to Symbicort  because of frequent exacerbations of coughing and wheezing.  He comes  back now after multiple trips to the ER.  (The record indicates he had  only been twice, the most recent in March 2008) after a cold.  Presently, he is on another course of prednisone, and tells me that  every time his prednisone is tapered he exacerbates.  When he  exacerbates, it is typically due to dyspnea with congestive cough, but  no purulent sputum, pleuritic pain, exertional chest pain, orthopnea,  PND, or leg swelling.   PAST MEDICAL HISTORY:  Significant for:  1. Hypertension.  2. Diabetes.  3. Hyperlipidemia.  4. Remote back surgery.   ALLERGIES:  NONE KNOWN.   MEDICATIONS:  Taken in detail on the worksheet, although I am not  confident he takes the medications as they are listed, see dated May 12, 2007 for details.   SOCIAL HISTORY:  He continues to smoke a half pack per day.  He has no  unusual travel or hobby exposure.   FAMILY HISTORY:  Positive for asthma in his mother, otherwise negative  for TB or respiratory disease.   REVIEW OF SYSTEMS:  Taken in detail also on the worksheet, negative  except as outlined above.   PHYSICAL EXAMINATION:  This is a stoic, ambulatory, black male, in no  acute  distress.  He has stable vital signs.  HEENT:  Reveals adequate dentition.  There is moderate turbinate edema.  He had mint gum in his mouth at the time of exam.  Ear canals revealed  moderate wax retention bilaterally.  NECK:  Supple without cervical adenopathy or tenderness.  Trachea is  midline without thyromegaly.  LUNG FIELDS:  Revealed diminished breath sounds, but no wheeze.  HEART:  Regular rate and rhythm without murmur, gallop, or rub.  ABDOMEN:  Soft and benign.  EXTREMITIES:  Warm without calf tenderness, cyanosis, clubbing, or  edema.   Chest CT scan dated May 05, 2007 is basically normal.   IMPRESSION:  Chronic obstructive pulmonary disease with frequent  exacerbation and need for prednisone Dosepak's of uncertain etiology.  I was concerned previously about his upper airway, and I suspect reflux  may be playing a role here.  Obviously, if he is refluxing, the most  important aspect of his care is for him to stop smoking, and to stop the  mint exposure.     Charlaine Dalton. Sherene Sires, MD, [facility]  Electronically Signed    MBW/MedQ  DD: 05/12/2007  DT: 05/12/2007  Job #: [id number]

## 2011-05-06 NOTE — Assessment & Plan Note (Signed)
[facility]                         GASTROENTEROLOGY OFFICE NOTE   Richard Hopkins, Richard Hopkins                     MRN:          [id number]  DATE:08/19/2007                            DOB:          02-21-1946    Richard Hopkins is a very nice 65 year old patient of Dr. [name], who is here  today for evaluation of abdominal pain.  At the time of his referral,  patient had periumbilical and lower abdominal pain across his lower  abdomen, which had bothered him for a period of about two or three  weeks.  He was rather constipated and subsequently had some diarrhea.  There was no fever or rectal bleeding.  He has recently had some  irritation of his hemorrhoids.  We saw Richard Hopkins in 1997 for  dysphagia.  He had a benign distal esophageal stricture, which was  dilated with [facility] dilator.  He has since then not had any problems,  being on Prilosec 20 mg a day.  Recently, his Prilosec was switched to  over-the-counter omeprazole 20 mg a day, which is not as good.   MEDICATIONS:  1. HCTZ 25 mg p.o. daily.  2. Omeprazole 20 mg p.o. daily.  3. Lovastatin 20 mg q.h.s.  4. Metformin 500 mg two daily.  5. Symbicort.  6. Nasacort.  7. Aspirin 81 mg p.o. daily.  8. Claritin one p.o. b.i.d.  9. Clonidine 0.1 mg daily.   PAST HISTORY:  Significant for diabetes, hypertension and COPD, followed  by Dr. Elesa Massed.  He is a smoker.  He has hyperlipidemia.   PHYSICAL EXAM:  Blood pressure 170/82, pulse 88 and weight 224 pounds.  He was alert, oriented, in no distress.  LUNGS:  Clear to auscultation.  COR:  With normal S1, normal S2.  ABDOMEN:  Soft, nontender, with normoactive bowel sounds.  I could not  elicit any tenderness.  No palpable mass.  RECTAL EXAM:  Shows palpable internal hemorrhoids.  Stool was trace  Hemoccult-positive.   LABORATORY DATA:  From June 22, 2007, showed normal electrolytes.  Last  hemoglobin, May 06, 2007, was 14.6, hematocrit 43.2.    IMPRESSION:  1. Patient is a 65 year old African-American male with episode,      lasting two or three weeks, of periumbilical abdominal pain and      associated constipation.  It could have been related to simple      functional constipation.  He seems to be getting better at this      point.  Some of his medications, specifically HCTZ, could cause      constipation, but since he is trace Hemoccult-positive, I wonder if      there was any segmental colitis, ischemic colitis or possibility of      diverticular disease, colon polyps, or even obstructing lesion of      the colon.  He has never had a colonoscopy.  2. History of gastroesophageal reflux, currently asymptomatic on PPI.   PLAN:  1. Patient would prefer cheaper medication for his reflux, so we will      give him Zantac 300 mg b.i.d.  He can  get it for $4 at [facility]  2. Colonoscopy scheduled.  3. High-fiber diet.  I would like to put him on some fiber supplements      to see whether we can regulate his bowel habits.     Hedwig Morton. Juanda Chance, MD  Electronically Signed    DMB/MedQ  DD: 08/19/2007  DT: 08/20/2007  Job #: [id number]   cc:   Corwin Levins, MD

## 2011-05-06 NOTE — Assessment & Plan Note (Signed)
[facility]                             PULMONARY OFFICE NOTE   CARMEL, WADDINGTON                     MRN:          [id number]  DATE:08/16/2007                            DOB:          [birth year]    HISTORY:  This is a 65 year old black male, active smoker complaining of  fatigue with cough that has improved since switching off of ACE  inhibitors on July 24 and back to Benicar 1 daily which he says he can  not afford.  He did bring with him a generic list of medicines from [facility], on which there are no ARBs and asked for help with his  medications.   Unfortunately he is still actively smoking up to a pack per day but says  he is thinking about quitting.   His new complaint today is one of lower abdominal pain that occurs  several times a day like he needs to have a bowel movement but then  actually is worse after a bowel movement.  This lasts for up to 30-40  minutes, is not associated with any diaphoresis, dysuria, fever, chills,  sweats or unintended weight loss.  He has already contacted Dr. Verlee Monte  Brodie's office but states that he is not scheduled until in October to  see her.   MEDICATIONS:  Reviewed in detail on the face sheet, dated August 16, 2007.  Correct as listed.   PHYSICAL EXAMINATION:  He is a pleasant ambulatory black male in no  acute distress, weighs 225 pounds unchanged from baseline.  He is  afebrile, normal vital signs.  HEENT:  Unremarkable, oropharynx clear.  LUNGS:  He did have a few rhonchi present bilaterally.  Regular rhythm without murmur, gallop, rub.  ABDOMEN:  Soft, benign.  EXTREMITIES:  Warm without calf tenderness, cyanosis, clubbing, edema.   His last set of PFTs performed in June show an FEV1 of 2.46 with a chest  x-ray performed March 19 indicating progressive interstitial lung  disease.   IMPRESSION:  1. Chronic obstructive pulmonary disease.  Is relatively mild but he      may have  interstitial lung disease related to smoking (either DIP      or RBILD for example) but rather than proceed with any kind of a      lung biopsy I think it makes more sense to enforce smoking      cessation and see if his x-ray improves.  I discussed this all      openly with the patient today and asked him to return in 6 weeks      for followup PFTs and chest x-rays, see if he is losing further      ground and if so would consider an open lung biopsy then, but only      as a last resort based on the above consideration.  2. In the meantime he has lower abdominal pain that is new in onset      and needs to be evaluated by gastroenterology.  If Dr. Juanda Chance is      not available I will  have him seen by one of the other      gastroenterologists in the meantime.  I do not think any of the      medications he is presently taking are contributing to the problem      and asked him to continue them, as per medication calendar, and      show them to all the doctors that he sees.  3. Hypertension.  He can not afford Benicar.  I recommended clonidine      0.1 mg b.i.d. which may also reduce the urge to smoke but I advised      him that it may in the short run make him have somewhat of a dry      mouth and be more sluggish.  4. Finally, I spend extra time teaching him how to use Symbicort      effectively and gave him a month's worth of samples and an advised      that this is not available in a generic form but it seems to me      that if he can afford to smoke he can afford his medicines and      there is a limit to what we can do to help him in this setting.     Charlaine Dalton. Sherene Sires, MD, [facility]  Electronically Signed    MBW/MedQ  DD: 08/16/2007  DT: 08/16/2007  Job #: [id number]   cc:   Corwin Levins, MD

## 2011-05-07 ENCOUNTER — Ambulatory Visit (INDEPENDENT_AMBULATORY_CARE_PROVIDER_SITE_OTHER): Payer: Medicare Other | Admitting: Internal Medicine

## 2011-05-07 ENCOUNTER — Encounter: Payer: Self-pay | Admitting: Internal Medicine

## 2011-05-07 VITALS — BP 124/78 | HR 88 | Temp 98.9°F | Ht 76.0 in | Wt 210.1 lb

## 2011-05-07 DIAGNOSIS — J449 Chronic obstructive pulmonary disease, unspecified: Secondary | ICD-10-CM

## 2011-05-07 DIAGNOSIS — E785 Hyperlipidemia, unspecified: Secondary | ICD-10-CM

## 2011-05-07 DIAGNOSIS — N400 Enlarged prostate without lower urinary tract symptoms: Secondary | ICD-10-CM

## 2011-05-07 DIAGNOSIS — I10 Essential (primary) hypertension: Secondary | ICD-10-CM

## 2011-05-07 MED ORDER — VARDENAFIL HCL 20 MG PO TABS: 20.0000 mg | ORAL_TABLET | Freq: Every day | ORAL | Status: DC | PRN

## 2011-05-07 MED ORDER — BUDESONIDE-FORMOTEROL FUMARATE 160-4.5 MCG/ACT IN AERO: 2.0000 | INHALATION_SPRAY | Freq: Two times a day (BID) | RESPIRATORY_TRACT | Status: DC

## 2011-05-07 MED ORDER — CLONIDINE HCL 0.1 MG PO TABS: 0.1000 mg | ORAL_TABLET | Freq: Two times a day (BID) | ORAL | Status: DC

## 2011-05-07 MED ORDER — PREDNISONE 10 MG PO TABS: 10.0000 mg | ORAL_TABLET | Freq: Every day | ORAL | Status: AC

## 2011-05-07 MED ORDER — LOVASTATIN 40 MG PO TABS: 40.0000 mg | ORAL_TABLET | Freq: Every day | ORAL | Status: DC

## 2011-05-07 MED ORDER — METFORMIN HCL 500 MG PO TABS: 500.0000 mg | ORAL_TABLET | Freq: Two times a day (BID) | ORAL | Status: DC

## 2011-05-07 MED ORDER — PREDNISONE 10 MG PO TABS: 5.0000 mg | ORAL_TABLET | Freq: Every day | ORAL | Status: DC

## 2011-05-07 MED ORDER — TAMSULOSIN HCL 0.4 MG PO CAPS: 0.4000 mg | ORAL_CAPSULE | Freq: Every day | ORAL | Status: DC

## 2011-05-07 MED ORDER — OMEPRAZOLE 20 MG PO TBEC: 1.0000 | DELAYED_RELEASE_TABLET | Freq: Every day | ORAL | Status: DC

## 2011-05-07 NOTE — Patient Instructions (Addendum)
Take all new medications as prescribed - the higher dose prednisone Increase the clonidine to .1 mg twice per day Stop the fluid pill (HCTZ) Increase the lovastatin to 40 mg per day OK to re-start the flomax All of your medications were refilled to the pharmacy Please return in 4 months

## 2011-05-09 NOTE — Op Note (Signed)
[facility]  Patient:    Richard Hopkins, Richard Hopkins Visit Number: [id number] MRN: [id number]          Service Type: SUR Location: [id number] Attending Physician:  Jacki Cones Dictated by:   Veverly Fells Ophelia Charter, M.D. Proc. 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  PROCEDURE:  Left L4-5 microdiskectomy, removal of herniated nucleus pulposus.  SURGEON:  Mark C. Ophelia Charter, M.D.  ASSISTANT:  Genene Churn. Owens, P.A.-C.  ANESTHESIA:  GOT plus Marcaine skin local.  COMPLICATIONS:  A 2 mm dural tear, anatomic repair with 6-0 Prolene, two simple sutures.  CLINICAL NOTE:  This 65 year old male has had previous diskectomies at L4-5 and L5-S1 10 years ago done elsewhere.  He had a 15 cm incision and had near-complete left hemilaminectomy at L4 as well as L5.  The patient has had a recurrent HNP, has been followed now for well over a year with recurrent HNP. He has had two MRIs with most recent one in May showing enlargement of the fragment.  He has been through therapy and epidural steroid injections without relief.  MRI shows large central and left HNP fragment with nerve root displacement consistent with his leg weakness and radiculopathy.  DESCRIPTION OF PROCEDURE:  The patient was placed on the Andrews frame in the kneeling position with preoperative padding.  Preoperative Ancef was given, and the back was prepped with Duraprep, squared with towels, and Betadine Vi-Drape was applied with laminectomy sheet.  Needle localization with the needle at the expected level of 4-5 was performed with crosstable lateral x-ray.  X-ray showed that the needle was at the end plate L4 2 mm above the disk space if an imaginary line was brought down on the tip  of the needle. Incision was made 3-4 cm.  There was extensive fibrotic scar tissue present in the gutter, and sharp dissection was required rather than subperiosteal dissection with a Cobb, with the fibrotic scar tissue against the spinous process.  The L3 lamina was palpated with a fingertip for appropriate depth, and there was a small 3 or 4 cm portion of the top of the left L4 lamina that was still remaining.  Scar tissue was extensive and the edge of the lamina was cleared off with a small cup curette.  A Frankey Poot was used to thin the top of the lamina some, and then the 2 mm and 3 mm Kerrisons were used after dissection with a dural separator underneath the lamina to make sure that there were no adhesions, and the lamina was removed.  Bone was removed from the lateral gutter.  There was no obvious remaining ligamentum with the dense fibrotic tissue present.  Superiorly where the lamina had been removed, the dura was visualized.  Titanium microdissectors were used to follow the dura down in the lateral gutter, trying to separate it out from the fibrotic tissue and facet overgrowth.  At the shoulder of the nerve root a 2 mm dural tear occurred, which was on the left dorsal lateral.  A small patty was placed, and continued dissection was performed until the nerve root was free and the dura had been dissected off the annulus.  A  DErrico was placed at this point and the annulus was incised with the 15 scalpel blade.  Micropituitary, regular pituitary, up and down micropituitaries were used to remove some disk fragment.  This gave some decompression and relaxation on the nerve root and annular incision was enlarged finally, and a large fragment that was at the level of the disk and slightly above was then delivered using Epstein curette and a nerve hook to deliver the fragment.  The large piece of fragment was delivered, and this gave adequate room.  Attention was then turned to the 2 mm dural  tear.  Two simple 6-0 Prolene sutures were placed for anatomic repair. Valsalva by anesthesia to 30 cm showed no leak.  Passes were made back into the disk to remove some remaining degenerative pieces.  The fibrotic sac where the disk herniation had been present was then dissected away.  One small epidural vein was coagulated.  Irrigation was used throughout the case and once the sac was removed, a hockey stick could be slipped in an 180 degree sweep anterior to the dura with no areas of compression.  The nerve root was free.  The axilla of the nerve root was not dissected out due to the extensive fibrosis that was present and previous lamina removal with scarring all the way down the gutter to the S1 nerve root by MRI.  A hockey stick was easily passed anterior to the nerve root.  No areas of compression.  It was free. The shoulder was well-decompressed, and final passes were made through the disk and inspection anterior to the dura to make sure there were no areas of compression.  None were present and after irrigation, the fascia was closed with 0 Vicryl, 2-0 Vicryl in the subcutaneous tissue, Marcaine infiltration of the skin, skin staple closure.  Direct visualization prior to closure showed the dural repair was dry and again a Valsalva showed watertight closure with no leak.  A dressing was applied.  The patient was transferred to the recovery room in stable condition.  Instrument count and needle count were correct. Dictated by:   Veverly Fells Ophelia Charter, M.D. Attending Physician:  Jacki Cones DD:  06/10/02 TD:  06/12/02 Job: [id number]

## 2011-05-13 ENCOUNTER — Encounter: Payer: Self-pay | Admitting: Internal Medicine

## 2011-05-13 NOTE — Assessment & Plan Note (Signed)
Uncontrolled, to increase the lovastatin to 40 mg,  to f/u any worsening symptoms or concerns  Lab Results  Component Value Date   LDLCALC 86 06/26/2010

## 2011-05-13 NOTE — Assessment & Plan Note (Signed)
To re-start the flomax asd,  to f/u any worsening symptoms or concerns

## 2011-05-13 NOTE — Assessment & Plan Note (Signed)
to increased the clonidine to 0.1 bid, avoid hctz for now due to ED symptoms,  to f/u any worsening symptoms or concerns,  BP Readings from Last 3 Encounters:  05/07/11 124/78  02/18/11 110/72  01/16/11 104/70

## 2011-05-13 NOTE — Assessment & Plan Note (Signed)
Ok to incr the pred to 10 qd, gave samples symbicort as well,  to f/u any worsening symptoms or concerns

## 2011-05-13 NOTE — Progress Notes (Signed)
Subjective:    Patient ID: Richard Hopkins, male    DOB: Oct 18, 1946, 65 y.o.   MRN: [id number]  HPI Here to f/u; c/o ongoing wheezing/sob, symbicort quite expensive, asks to increase the pred back to 10 qd as did well with this previously;  Here to f/u; overall doing ok,  Pt denies chest pain, increased sob or doe, wheezing, orthopnea, PND, increased LE swelling, palpitations, dizziness or syncope.  Pt denies new neurological symptoms such as new headache, or facial or extremity weakness or numbness   Pt denies polydipsia, polyuria, or low sugar symptoms such as weakness or confusion improved with po intake.  Pt states overall good compliance with meds, trying to follow lower cholesterol, diabetic diet, wt overall stable but little exercise however.  Does have ED symptoms worse on the HCTZ and wishes to avoid this.  Stopped the flomax thinking this might help since the side effect profile mentioned this, but did not help.   Past Medical History  Diagnosis Date  . THRUSH 04/18/2008  . 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 DIABETES MELLITUS, TYPE II 09/28/2007  . HYPERLIPIDEMIA 09/28/2007  . SMOKER 08/30/2010  . BLURRED VISION 10/15/2010  . HYPERTENSION 09/28/2007  . ALLERGIC RHINITIS 09/28/2007  . CHRONIC OBSTRUCTIVE PULMONARY DISEASE, ACUTE EXACERBATION 04/18/2008  . COPD 09/28/2007  . 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 ESOPHAGEAL STRICTURE 09/28/2007  . GERD 09/28/2007  . DIVERTICULAR DISEASE 08/31/2007  . BENIGN PROSTATIC HYPERTROPHY 10/09/2008  . Acute prostatitis 04/01/2010  . EPIDIDYMO-ORCHITIS 04/01/2010  . SKIN LESION 01/19/2008  . LUMBAR DISC DISORDER 09/28/2007  . Pain in soft tissues of limb 01/29/2009  . MUSCLE CRAMPS 10/15/2010  . FATIGUE 09/28/2007  . MASS, SUPERFICIAL 11/06/2008  . DYSPNEA 08/20/2010  . Wheezing 02/14/2010  . DYSPNEA/SHORTNESS OF BREATH 09/28/2007  . Nocturia 10/09/2008  . PSA, INCREASED 06/26/2010  . ABNORMAL ELECTROCARDIOGRAM 08/24/2009  . ERECTILE DYSFUNCTION, ORGANIC, HX OF  09/28/2007  . SYNCOPE 01/16/2011   Past Surgical History  Procedure Date  . Back surgury     x 4 disabled after 2003    reports that he has been smoking.  He does not have any smokeless tobacco history on file. He reports that he drinks alcohol. He reports that he does not use illicit drugs. family history includes Diabetes in his mother and Hypertension in his other. Allergies  Allergen Reactions  . Ace Inhibitors   . Lisinopril     REACTION: cough   Current Outpatient Prescriptions on File Prior to Visit  Medication Sig Dispense Refill  . aspirin 81 MG EC tablet Take 81 mg by mouth daily.         Review of Systems All otherwise neg per pt     Objective:   Physical Exam BP 124/78  Pulse 88  Temp(Src) 98.9 F (37.2 C) (Oral)  Ht 6\' 4"  (1.93 m)  Wt 210 lb 2 oz (95.312 kg)  BMI 25.58 kg/m2  SpO2 92% Physical Exam  VS noted Constitutional: Pt appears well-developed and well-nourished.  HENT: Head: Normocephalic.  Right Ear: External ear normal.  Left Ear: External ear normal.  Eyes: Conjunctivae and EOM are normal. Pupils are equal, round, and reactive to light.  Neck: Normal range of motion. Neck supple.  Cardiovascular: Normal rate and regular rhythm.   Pulmonary/Chest: Effort normal and breath sounds decreased bilat  Abd:  Soft, NT, non-distended, + BS Neurological: Pt is alert. No cranial nerve deficit.  Skin: Skin is warm. No erythema.  Psychiatric: Pt behavior is  normal. Thought content normal.         Assessment & Plan:

## 2011-07-02 ENCOUNTER — Encounter: Payer: Self-pay | Admitting: Internal Medicine

## 2011-07-02 ENCOUNTER — Ambulatory Visit (INDEPENDENT_AMBULATORY_CARE_PROVIDER_SITE_OTHER): Payer: Medicare Other | Admitting: Internal Medicine

## 2011-07-02 VITALS — BP 130/82 | HR 78 | Temp 98.1°F | Ht 76.0 in | Wt 212.1 lb

## 2011-07-02 DIAGNOSIS — J449 Chronic obstructive pulmonary disease, unspecified: Secondary | ICD-10-CM

## 2011-07-02 DIAGNOSIS — I10 Essential (primary) hypertension: Secondary | ICD-10-CM

## 2011-07-02 DIAGNOSIS — M79609 Pain in unspecified limb: Secondary | ICD-10-CM

## 2011-07-02 DIAGNOSIS — M79646 Pain in unspecified finger(s): Secondary | ICD-10-CM | POA: Insufficient documentation

## 2011-07-02 DIAGNOSIS — N400 Enlarged prostate without lower urinary tract symptoms: Secondary | ICD-10-CM | POA: Insufficient documentation

## 2011-07-02 MED ORDER — TIOTROPIUM BROMIDE MONOHYDRATE 18 MCG IN CAPS: 18.0000 ug | ORAL_CAPSULE | Freq: Every day | RESPIRATORY_TRACT | Status: DC

## 2011-07-02 MED ORDER — TAMSULOSIN HCL 0.4 MG PO CAPS: 0.4000 mg | ORAL_CAPSULE | Freq: Every day | ORAL | Status: DC

## 2011-07-02 MED ORDER — TRAMADOL-ACETAMINOPHEN 37.5-325 MG PO TABS: 1.0000 | ORAL_TABLET | Freq: Four times a day (QID) | ORAL | Status: AC | PRN

## 2011-07-02 NOTE — Assessment & Plan Note (Signed)
Mild to mod overall , c/w DJD of prox thumb joint, ok for tylenol arthritis, should avoid nsaids while on the chronic prednisone due to risk of PUD, also for ultracet prn breakthrough pain

## 2011-07-02 NOTE — Assessment & Plan Note (Signed)
stable overall by hx and exam, most recent data reviewed with pt, and pt to continue medical treatment as before  BP Readings from Last 3 Encounters:  07/02/11 130/82  05/07/11 124/78  02/18/11 110/72

## 2011-07-02 NOTE — Progress Notes (Signed)
Subjective:    Patient ID: Richard Hopkins, male    DOB: 16-May-1946, 65 y.o.   MRN: [id number]  HPI  Here to f/u;  Overall doing ok, but c/o some increased sob/doe recently with more humid weather and has had to stay inside;  Has mildly reduced his exercise tolerance but can essentially ambulate same distance if goes a bit slower, several hundred yards.  Overall good compliance with treatment, and good medicine tolerability. Used to take spiriva, but was expensive so did not continue, though it did seem to help. Pt denies chest pain,  wheezing, orthopnea, PND, increased LE swelling, palpitations, dizziness or syncope., and  Pt denies fever, pain, wt loss, night sweats, loss of appetite, or other constitutional symptoms  No cough, ST or recent sinus symptoms such as congestion, itch, sneeze.  Pt denies new neurological symptoms such as new headache, or facial or extremity weakness or numbness.   Pt denies polydipsia, polyuria.  Does also mention that although he has significant improved urinary flow on the flomax, it causes him not to be able to ejaculate with orgasm; he has held off on taking a few times which reduced urinary flow, but was able to ejaculate.  Also for some reason flomax was $30, a large increase at [facility] at last visit, and he wonders why this would be, when it used to be much less expensive (and brand was $140/mo).  Also c/o ongoing 2-3 mo base of left thumb pain, some better with wearing a wrist splint he has with him today, but still tender and painful, does not think he overuses the hand or wrist, and no swelling or recent trauma . Past Medical History  Diagnosis Date  . THRUSH 04/18/2008  . 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 DIABETES MELLITUS, TYPE II 09/28/2007  . HYPERLIPIDEMIA 09/28/2007  . SMOKER 08/30/2010  . BLURRED VISION 10/15/2010  . HYPERTENSION 09/28/2007  . ALLERGIC RHINITIS 09/28/2007  . CHRONIC OBSTRUCTIVE PULMONARY DISEASE, ACUTE EXACERBATION 04/18/2008  . COPD  09/28/2007  . 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 ESOPHAGEAL STRICTURE 09/28/2007  . GERD 09/28/2007  . DIVERTICULAR DISEASE 08/31/2007  . BENIGN PROSTATIC HYPERTROPHY 10/09/2008  . Acute prostatitis 04/01/2010  . EPIDIDYMO-ORCHITIS 04/01/2010  . SKIN LESION 01/19/2008  . LUMBAR DISC DISORDER 09/28/2007  . Pain in soft tissues of limb 01/29/2009  . MUSCLE CRAMPS 10/15/2010  . FATIGUE 09/28/2007  . MASS, SUPERFICIAL 11/06/2008  . DYSPNEA 08/20/2010  . Wheezing 02/14/2010  . DYSPNEA/SHORTNESS OF BREATH 09/28/2007  . Nocturia 10/09/2008  . PSA, INCREASED 06/26/2010  . ABNORMAL ELECTROCARDIOGRAM 08/24/2009  . 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 SYNCOPE 01/16/2011   Past Surgical History  Procedure Date  . Back surgury     x 4 disabled after 2003    reports that he has been smoking.  He does not have any smokeless tobacco history on file. He reports that he drinks alcohol. He reports that he does not use illicit drugs. family history includes Diabetes in his mother and Hypertension in his other. Allergies  Allergen Reactions  . Ace Inhibitors   . Lisinopril     REACTION: cough   Current Outpatient Prescriptions on File Prior to Visit  Medication Sig Dispense Refill  . aspirin 81 MG EC tablet Take 81 mg by mouth daily.        . budesonide-formoterol (SYMBICORT) 160-4.5 MCG/ACT inhaler Inhale 2 puffs into the lungs 2 (two) times daily.  1 Inhaler  11  . cloNIDine (CATAPRES) 0.1  MG tablet Take 1 tablet (0.1 mg total) by mouth 2 (two) times daily.  180 tablet  3  . lovastatin (MEVACOR) 40 MG tablet Take 1 tablet (40 mg total) by mouth daily.  90 tablet  3  . metFORMIN (GLUCOPHAGE) 500 MG tablet Take 1 tablet (500 mg total) by mouth 2 (two) times daily with a meal.  180 tablet  3  . Omeprazole 20 MG TBEC Take 1 tablet (20 mg total) by mouth daily.  90 each  3  . predniSONE (DELTASONE) 10 MG tablet Take 0.5 tablets (5 mg total) by mouth daily. 1/2 by mouth once daily  90 tablet  3  .  vardenafil (LEVITRA) 20 MG tablet Take 1 tablet (20 mg total) by mouth daily as needed.  10 tablet  5   Review of Systems Review of Systems  Constitutional: Negative for diaphoresis and unexpected weight change.  HENT: Negative for drooling and tinnitus.   Eyes: Negative for photophobia and visual disturbance.  Respiratory: Negative for choking and stridor.   Gastrointestinal: Negative for vomiting and blood in stool.  Genitourinary: Negative for hematuria and decreased urine volume.  Musculoskeletal: Negative for gait problem.  Skin: Negative for color change and wound.  Neurological: Negative for tremors and numbness.  Psychiatric/Behavioral: Negative for decreased concentration. The patient is not hyperactive.       Objective:   Physical Exam BP 130/82  Pulse 78  Temp(Src) 98.1 F (36.7 C) (Oral)  Ht 6\' 4"  (1.93 m)  Wt 212 lb 2 oz (96.219 kg)  BMI 25.82 kg/m2  SpO2 97% Physical Exam  VS noted Constitutional: Pt appears well-developed and well-nourished.  HENT: Head: Normocephalic.  Right Ear: External ear normal.  Left Ear: External ear normal.  Pharynx no erythema, no submandib LA Eyes: Conjunctivae and EOM are normal. Pupils are equal, round, and reactive to light.  Neck: Normal range of motion. Neck supple.  Cardiovascular: Normal rate and regular rhythm.   Pulmonary/Chest: Effort normal and breath sounds bilat decreased without wheezing.  Neurological: Pt is alert. No cranial nerve deficit.  Skin: Skin is warm. No erythema.  Psychiatric: Pt behavior is normal. Thought content normal.  Left thumb PIP with mild bony tender changes but FROM, no significant effusion or first dorsal compartment tender       Assessment & Plan:

## 2011-07-02 NOTE — Assessment & Plan Note (Signed)
D/w pt - he is willing to re-start the flomax despite what sounds like retrograde ejaculation while taking it, to improve symtpoms, and reduce chance of UTI

## 2011-07-02 NOTE — Assessment & Plan Note (Addendum)
With mild ongoing symptoms, exercise intoleracne without wheezing today, despite the predisone, has done better with addition of spiriva in the past, will re-try this and he is given 1 mo sample and rx today,  Continue all other medications as before

## 2011-07-02 NOTE — Patient Instructions (Addendum)
Take all new medications as prescribed Continue all other medications as before  

## 2011-07-18 ENCOUNTER — Ambulatory Visit: Payer: Self-pay | Admitting: Internal Medicine

## 2011-07-24 ENCOUNTER — Other Ambulatory Visit: Payer: Self-pay | Admitting: Internal Medicine

## 2011-08-20 ENCOUNTER — Ambulatory Visit (INDEPENDENT_AMBULATORY_CARE_PROVIDER_SITE_OTHER): Payer: Medicare Other | Admitting: Internal Medicine

## 2011-08-20 ENCOUNTER — Encounter: Payer: Self-pay | Admitting: Internal Medicine

## 2011-08-20 VITALS — BP 146/88 | HR 80 | Temp 98.7°F | Ht 76.0 in | Wt 212.0 lb

## 2011-08-20 DIAGNOSIS — E785 Hyperlipidemia, unspecified: Secondary | ICD-10-CM

## 2011-08-20 DIAGNOSIS — R062 Wheezing: Secondary | ICD-10-CM

## 2011-08-20 DIAGNOSIS — J209 Acute bronchitis, unspecified: Secondary | ICD-10-CM

## 2011-08-20 DIAGNOSIS — M542 Cervicalgia: Secondary | ICD-10-CM

## 2011-08-20 DIAGNOSIS — N400 Enlarged prostate without lower urinary tract symptoms: Secondary | ICD-10-CM

## 2011-08-20 MED ORDER — DOXAZOSIN MESYLATE 2 MG PO TABS: 2.0000 mg | ORAL_TABLET | Freq: Every day | ORAL | Status: DC

## 2011-08-20 MED ORDER — CYCLOBENZAPRINE HCL 5 MG PO TABS: 5.0000 mg | ORAL_TABLET | Freq: Three times a day (TID) | ORAL | Status: AC | PRN

## 2011-08-20 MED ORDER — AZITHROMYCIN 250 MG PO TABS: ORAL_TABLET | ORAL | Status: AC

## 2011-08-20 MED ORDER — LOVASTATIN 40 MG PO TABS: ORAL_TABLET | ORAL | Status: DC

## 2011-08-20 MED ORDER — METHYLPREDNISOLONE ACETATE 80 MG/ML IJ SUSP
120.0000 mg | Freq: Once | INTRAMUSCULAR | Status: AC
  Administered 2011-08-20: 120 mg via INTRAMUSCULAR

## 2011-08-20 NOTE — Assessment & Plan Note (Signed)
flomax too expensive - to change to cardura 2 mg qhs

## 2011-08-20 NOTE — Assessment & Plan Note (Signed)
Mild to mod, for antibx course,  to f/u any worsening symptoms or concerns 

## 2011-08-20 NOTE — Assessment & Plan Note (Signed)
Mild, for depomedrol IM today, cont home prednisone,  to f/u any worsening symptoms or concerns

## 2011-08-20 NOTE — Assessment & Plan Note (Addendum)
Uncontrolled, for increased lovastatin to 80 mg total;  I would like to change to lipitor 40 mg but too expensive as he does not have drug insurance component  Lab Results  Component Value Date   LDLCALC 86 06/26/2010

## 2011-08-20 NOTE — Patient Instructions (Addendum)
Take all new medications as prescribed - the antibiotic, and the low dose cardura generic (for prostate), and flexeril for the neck pain/muscle strain for as needed use You had the steroid shot today Please increase the lovastatin (for cholesterol) to 2 pills per day (total of 80 mg) Continue all other medications as before, including the prednisone OK to stop the flomax (as you are not taking now) Please return in 6 months, or sooner if needed (we will need more lab work then)

## 2011-08-20 NOTE — Progress Notes (Signed)
Subjective:    Patient ID: Richard Hopkins, male    DOB: [birth year], 65 y.o.   MRN: [id number]  HPI  Here with acute onset mild to mod 2-3 days ST, HA, general weakness and malaise, with prod cough greenish sputum, but Pt denies chest pain, increased sob or doe, wheezing, orthopnea, PND, increased LE swelling, palpitations, dizziness or syncope, except for mild wheezing starting today;  Remembers the last time he had this he "waited too long" and wheezing became worse, had to go to ER.  Pt denies new neurological symptoms such as new headache, or facial or extremity weakness or numbness   Pt denies polydipsia, polyuria.  Overall good compliance with treatment, and good medicine tolerability, except only uses the spiriva sporadically if at all due to cost.  Asks for symbicort sample, and depomedrol today.  Flomax also too expensive even though is generic, as he has no drug insurance.  Also had onset crampy left neck discomfort for several days, tender to touch, but not radicular. Worse to lie down at night, aches some during the day as well.  Past Medical History  Diagnosis Date  . THRUSH 04/18/2008  . 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 DIABETES MELLITUS, TYPE II 09/28/2007  . HYPERLIPIDEMIA 09/28/2007  . SMOKER 08/30/2010  . BLURRED VISION 10/15/2010  . HYPERTENSION 09/28/2007  . ALLERGIC RHINITIS 09/28/2007  . CHRONIC OBSTRUCTIVE PULMONARY DISEASE, ACUTE EXACERBATION 04/18/2008  . COPD 09/28/2007  . 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 ESOPHAGEAL STRICTURE 09/28/2007  . GERD 09/28/2007  . DIVERTICULAR DISEASE 08/31/2007  . BENIGN PROSTATIC HYPERTROPHY 10/09/2008  . Acute prostatitis 04/01/2010  . EPIDIDYMO-ORCHITIS 04/01/2010  . SKIN LESION 01/19/2008  . LUMBAR DISC DISORDER 09/28/2007  . Pain in soft tissues of limb 01/29/2009  . MUSCLE CRAMPS 10/15/2010  . FATIGUE 09/28/2007  . MASS, SUPERFICIAL 11/06/2008  . DYSPNEA 08/20/2010  . Wheezing 02/14/2010  . DYSPNEA/SHORTNESS OF BREATH 09/28/2007  . Nocturia  10/09/2008  . PSA, INCREASED 06/26/2010  . ABNORMAL ELECTROCARDIOGRAM 08/24/2009  . 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 SYNCOPE 01/16/2011   Past Surgical History  Procedure Date  . Back surgury     x 4 disabled after 2003    reports that he has been smoking Cigarettes.  He has been smoking about 1 pack per day. He does not have any smokeless tobacco history on file. He reports that he drinks alcohol. He reports that he does not use illicit drugs. family history includes Diabetes in his mother and Hypertension in his other. Allergies  Allergen Reactions  . Ace Inhibitors   . Lisinopril     REACTION: cough   Current Outpatient Prescriptions on File Prior to Visit  Medication Sig Dispense Refill  . aspirin 81 MG EC tablet Take 81 mg by mouth daily.        . budesonide-formoterol (SYMBICORT) 160-4.5 MCG/ACT inhaler Inhale 2 puffs into the lungs 2 (two) times daily.  1 Inhaler  11  . cloNIDine (CATAPRES) 0.1 MG tablet Take 1 tablet (0.1 mg total) by mouth 2 (two) times daily.  180 tablet  3  . metFORMIN (GLUCOPHAGE) 500 MG tablet Take 1 tablet (500 mg total) by mouth 2 (two) times daily with a meal.  180 tablet  3  . Omeprazole 20 MG TBEC Take 1 tablet (20 mg total) by mouth daily.  90 each  3  . predniSONE (DELTASONE) 10 MG tablet Take 0.5 tablets (5 mg total) by mouth daily. 1/2 by mouth once daily  90 tablet  3  . vardenafil (LEVITRA) 20 MG tablet Take 1 tablet (20 mg total) by mouth daily as needed.  10 tablet  5  . tiotropium (SPIRIVA HANDIHALER) 18 MCG inhalation capsule Place 1 capsule (18 mcg total) into inhaler and inhale daily.  90 capsule  3   No current facility-administered medications on file prior to visit.   Review of Systems Review of Systems  Constitutional: Negative for diaphoresis and unexpected weight change.  HENT: Negative for drooling and tinnitus.   Eyes: Negative for photophobia and visual disturbance.  Respiratory: Negative for choking and stridor.    Gastrointestinal: Negative for vomiting and blood in stool.  Genitourinary: Negative for hematuria and decreased urine volume.  Musculoskeletal: Negative for gait problem.  Skin: Negative for color change and wound.  Neurological: Negative for tremors and numbness.  Psychiatric/Behavioral: Negative for decreased concentration. The patient is not hyperactive.       Objective:   Physical Exam BP 146/88  Pulse 80  Temp(Src) 98.7 F (37.1 C) (Oral)  Ht 6\' 4"  (1.93 m)  Wt 212 lb (96.163 kg)  BMI 25.81 kg/m2  SpO2 95% Physical Exam  VS noted, mild ill appearing Constitutional: Pt appears well-developed and well-nourished.  HENT: Head: Normocephalic.  Right Ear: External ear normal.  Left Ear: External ear normal.  Bilat tm's mild erythema.  Sinus nontender.  Pharynx mild erythema Eyes: Conjunctivae and EOM are normal. Pupils are equal, round, and reactive to light.  Neck: Normal range of motion. Neck supple.  Cardiovascular: Normal rate and regular rhythm.   Pulmonary/Chest: Effort normal and breath sounds decreased with mild wheeze bilat  Neurological: Pt is alert. No cranial nerve deficit. motor/sens/dtr intact throughout Skin: Skin is warm. No erythema.  Psychiatric: Pt behavior is normal. Thought content normal.  MSK:  Left paracervical mild tender diffusely without swelling, redness, rash       Assessment & Plan:

## 2011-08-20 NOTE — Assessment & Plan Note (Signed)
C/w msk strain,  For flexeril prn,  to f/u any worsening symptoms or concerns

## 2011-09-17 ENCOUNTER — Encounter: Payer: Self-pay | Admitting: Neurology

## 2011-09-17 ENCOUNTER — Encounter: Payer: Self-pay | Admitting: Internal Medicine

## 2011-09-17 ENCOUNTER — Ambulatory Visit (INDEPENDENT_AMBULATORY_CARE_PROVIDER_SITE_OTHER): Payer: Medicare Other | Admitting: Internal Medicine

## 2011-09-17 VITALS — BP 112/78 | HR 70 | Temp 98.0°F | Ht 76.0 in | Wt 211.0 lb

## 2011-09-17 DIAGNOSIS — J309 Allergic rhinitis, unspecified: Secondary | ICD-10-CM

## 2011-09-17 DIAGNOSIS — R29898 Other symptoms and signs involving the musculoskeletal system: Secondary | ICD-10-CM | POA: Insufficient documentation

## 2011-09-17 DIAGNOSIS — E119 Type 2 diabetes mellitus without complications: Secondary | ICD-10-CM

## 2011-09-17 DIAGNOSIS — I10 Essential (primary) hypertension: Secondary | ICD-10-CM

## 2011-09-17 MED ORDER — FLUTICASONE PROPIONATE 50 MCG/ACT NA SUSP: 2.0000 | Freq: Every day | NASAL | Status: DC

## 2011-09-17 NOTE — Assessment & Plan Note (Addendum)
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

## 2011-09-17 NOTE — Patient Instructions (Signed)
Take all new medications as prescribed - the flonase for the allergies Continue all other medications as before You will be contacted regarding the referral for: MRI for the brain, and neurology

## 2011-09-20 ENCOUNTER — Encounter: Payer: Self-pay | Admitting: Internal Medicine

## 2011-09-20 NOTE — Assessment & Plan Note (Signed)
stable overall by hx and exam, most recent data reviewed with pt, and pt to continue medical treatment as before  BP Readings from Last 3 Encounters:  09/17/11 112/78  08/20/11 146/88  07/02/11 130/82

## 2011-09-20 NOTE — Assessment & Plan Note (Signed)
Mild to mod, for flonase course,  to f/u any worsening symptoms or concerns

## 2011-09-20 NOTE — Assessment & Plan Note (Signed)
stable overall by hx and exam, most recent data reviewed with pt, and pt to continue medical treatment as before  Lab Results  Component Value Date   HGBA1C 6.5 01/16/2011

## 2011-09-20 NOTE — Progress Notes (Signed)
Subjective:    Patient ID: Richard Hopkins, male    DOB: January 28, 1946, 65 y.o.   MRN: [id number]  HPI  Here with acute onset x 5 days numbness to distal LUE and hand, with numbness and weakness to 4th adn 5th fingers, less grip strength as well, all since he was sitting at a meeting with the left arm resting hard on a table for some time;  No other trauma, fever, rash, swelling, neck/shoulder/elbow pain.  Pt denies chest pain, increased sob or doe, wheezing, orthopnea, PND, increased LE swelling, palpitations, dizziness or syncope.  Pt denies new neurological symptoms such as new headache, or facial or extremity weakness or numbness   Pt denies polydipsia, polyuria, or low sugar symptoms such as weakness or confusion improved with po intake.  Pt states overall good compliance with meds, trying to follow lower cholesterol, diabetic diet, wt overall stable but little exercise however.    Pt denies fever, wt loss, night sweats, loss of appetite, or other constitutional symptoms  Overall good compliance with treatment, and good medicine tolerability. Does have several wks ongoing nasal allergy symptoms with clear congestion, itch and sneeze, without fever, pain, ST, cough or wheezing.  Past Medical History  Diagnosis Date  . THRUSH 04/18/2008  . 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 DIABETES MELLITUS, TYPE II 09/28/2007  . HYPERLIPIDEMIA 09/28/2007  . SMOKER 08/30/2010  . BLURRED VISION 10/15/2010  . HYPERTENSION 09/28/2007  . ALLERGIC RHINITIS 09/28/2007  . CHRONIC OBSTRUCTIVE PULMONARY DISEASE, ACUTE EXACERBATION 04/18/2008  . COPD 09/28/2007  . 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 ESOPHAGEAL STRICTURE 09/28/2007  . GERD 09/28/2007  . DIVERTICULAR DISEASE 08/31/2007  . BENIGN PROSTATIC HYPERTROPHY 10/09/2008  . Acute prostatitis 04/01/2010  . EPIDIDYMO-ORCHITIS 04/01/2010  . SKIN LESION 01/19/2008  . LUMBAR DISC DISORDER 09/28/2007  . Pain in soft tissues of limb 01/29/2009  . MUSCLE CRAMPS 10/15/2010  . FATIGUE  09/28/2007  . MASS, SUPERFICIAL 11/06/2008  . DYSPNEA 08/20/2010  . Wheezing 02/14/2010  . DYSPNEA/SHORTNESS OF BREATH 09/28/2007  . Nocturia 10/09/2008  . PSA, INCREASED 06/26/2010  . ABNORMAL ELECTROCARDIOGRAM 08/24/2009  . 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 SYNCOPE 01/16/2011   Past Surgical History  Procedure Date  . Back surgury     x 4 disabled after 2003    reports that he has been smoking Cigarettes.  He has been smoking about 1 pack per day. He does not have any smokeless tobacco history on file. He reports that he drinks alcohol. He reports that he does not use illicit drugs. family history includes Diabetes in his mother and Hypertension in his other. Allergies  Allergen Reactions  . Ace Inhibitors   . Cardura (Doxazosin Mesylate)     dizzy  . Lisinopril     REACTION: cough   Current Outpatient Prescriptions on File Prior to Visit  Medication Sig Dispense Refill  . aspirin 81 MG EC tablet Take 81 mg by mouth daily.        . budesonide-formoterol (SYMBICORT) 160-4.5 MCG/ACT inhaler Inhale 2 puffs into the lungs 2 (two) times daily.  1 Inhaler  11  . cloNIDine (CATAPRES) 0.1 MG tablet Take 1 tablet (0.1 mg total) by mouth 2 (two) times daily.  180 tablet  3  . lovastatin (MEVACOR) 40 MG tablet 2 tabs by mouth daily  180 tablet  3  . metFORMIN (GLUCOPHAGE) 500 MG tablet Take 1 tablet (500 mg total) by mouth 2 (two) times daily with a meal.  180  tablet  3  . Omeprazole 20 MG TBEC Take 1 tablet (20 mg total) by mouth daily.  90 each  3  . predniSONE (DELTASONE) 10 MG tablet Take 0.5 tablets (5 mg total) by mouth daily. 1/2 by mouth once daily  90 tablet  3  . tiotropium (SPIRIVA HANDIHALER) 18 MCG inhalation capsule Place 1 capsule (18 mcg total) into inhaler and inhale daily.  90 capsule  3  . vardenafil (LEVITRA) 20 MG tablet Take 1 tablet (20 mg total) by mouth daily as needed.  10 tablet  5   Review of Systems Review of Systems  Constitutional: Negative for  diaphoresis and unexpected weight change.  HENT: Negative for drooling and tinnitus.   Eyes: Negative for photophobia and visual disturbance.  Respiratory: Negative for choking and stridor.   Gastrointestinal: Negative for vomiting and blood in stool.  Genitourinary: Negative for hematuria and decreased urine volume.  Musculoskeletal: Negative for gait problem.  Skin: Negative for color change and wound.    Objective:   Physical Exam BP 112/78  Pulse 70  Temp(Src) 98 F (36.7 C) (Oral)  Ht 6\' 4"  (1.93 m)  Wt 211 lb (95.709 kg)  BMI 25.68 kg/m2  SpO2 94% Physical Exam  VS noted, not ill appearing Constitutional: Pt appears well-developed and well-nourished.  HENT: Head: Normocephalic.  Right Ear: External ear normal.  Left Ear: External ear normal.  Bilat tm's mild erythema.  Sinus nontender.  Pharynx mild erythema Eyes: Conjunctivae and EOM are normal. Pupils are equal, round, and reactive to light.  Neck: Normal range of motion. Neck supple.  Cardiovascular: Normal rate and regular rhythm.   Pulmonary/Chest: Effort normal and breath sounds normal.  Neurological: Pt is alert. No cranial nerve deficit. motor/sens/dtr/gait intact except for decr sens to LT left ulnar distrubition at the distal arm/wrist/fingers and 4+/5 weakness 4th and 5th fingers Skin: Skin is warm. No erythema.  Psychiatric: Pt behavior is normal. Thought content normal.         Assessment & Plan:

## 2011-09-21 ENCOUNTER — Ambulatory Visit
Admission: RE | Admit: 2011-09-21 | Discharge: 2011-09-21 | Disposition: A | Discharge status: Home / Self Care | Payer: Medicare Other | Source: Ambulatory Visit | Attending: Internal Medicine | Admitting: Internal Medicine

## 2011-09-21 ENCOUNTER — Encounter: Payer: Self-pay | Admitting: Internal Medicine

## 2011-09-21 ENCOUNTER — Other Ambulatory Visit: Payer: Self-pay | Admitting: Internal Medicine

## 2011-09-21 DIAGNOSIS — D496 Neoplasm of unspecified behavior of brain: Secondary | ICD-10-CM

## 2011-09-21 DIAGNOSIS — R29898 Other symptoms and signs involving the musculoskeletal system: Secondary | ICD-10-CM

## 2011-09-23 ENCOUNTER — Other Ambulatory Visit: Payer: Self-pay | Admitting: Neurosurgery

## 2011-09-23 DIAGNOSIS — C719 Malignant neoplasm of brain, unspecified: Secondary | ICD-10-CM

## 2011-09-24 ENCOUNTER — Encounter: Payer: Self-pay | Admitting: Neurology

## 2011-09-24 ENCOUNTER — Other Ambulatory Visit: Payer: Medicare Other

## 2011-09-24 ENCOUNTER — Ambulatory Visit (INDEPENDENT_AMBULATORY_CARE_PROVIDER_SITE_OTHER): Payer: Medicare Other | Admitting: Neurology

## 2011-09-24 DIAGNOSIS — G5620 Lesion of ulnar nerve, unspecified upper limb: Secondary | ICD-10-CM

## 2011-09-24 DIAGNOSIS — D496 Neoplasm of unspecified behavior of brain: Secondary | ICD-10-CM

## 2011-09-24 NOTE — Progress Notes (Signed)
Dear Dr. [name],  Thank you for having me see Richard Hopkins in consultation today at [facility] for his problem with left arm sensory changes.  As you may recall, he is a 65 y.o. year old male with a history of a left hemispheric tumor who on September 22nd noted that after a long time sitting with him arms on a table, had numbness in the left 4th and 5th digit as well as weakness in the hand.  This has slowly improved but has not resolved.  He denies new neck pain or change in the quality of the numbness or weakness with neck movement.  You ordered an MRI of his brain which revealed interval increase in his known left hemispheric brain tumor.  He was seen by Dr. Jordan Likes this Monday at [facility] who ordered an MRI of his brain with contrast.  He also ordered an EMG/NCS of his left arm.  Medical History: 1. 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  Headaches  Surgical History:  Past Surgical History  Procedure Date  . Back surgury     x 4 disabled after 2003   Social History:  Social History Main Topics  . Smoking status: Current Everyday Smoker -- 1.0 packs/day    Types: Cigarettes  . Smokeless tobacco: Never Used  . Alcohol Use: Yes  . Drug Use: No  . Sexually Active: None     Family History: No history of pressure palsies.   ROS:  13 systems were reviewed and are notable for balance problems and arthritis.  He denies visual changes, difficulty swallowing or speaking.   He has not had an exacerbation in his headaches and in fact these were more frequent in the past.All other review of systems are unremarkable.   Examination:  Filed Vitals:   09/24/11 1010  BP: 134/84  Pulse: 80  Height: 6\' 4"  (1.93 m)  Weight: 212 lb (96.163 kg)     In general, well nourished older African American man in NAD.  Cardiovascular: The patient has a regular rate and  rhythm and no carotid bruits.  Fundoscopy:  Disks are flat. Vessel caliber within normal limits.  Spontaneous venous pulsations seen bilaterally.  Mental status:   The patient is oriented to person, place and time. Recent and remote memory are intact. Attention span and concentration are normal. Language including repetition, naming, following commands are intact. Fund of knowledge of current and historical events, as well as vocabulary are normal.  Cranial Nerves: Pupils are equally round and reactive to light. Visual fields full to confrontation. Extraocular movements are intact without nystagmus. Facial sensation and muscles of mastication are intact. Muscles of facial expression are symmetric. Hearing intact to bilateral finger rub. Tongue protrusion, uvula, palate midline.  Shoulder shrug intact  OKN normal bilaterally.  Motor:  The patient has normal bulk and tone, no pronator drift and 5/5 strength bilaterally except for flexion of 4th and 5th digit on the left.  Also weakness of finger abduction on the left as well as thumb adduction. There are no adventitious movements.  Reflexes:  Biceps  brachioradialis  triceps  patella  ankle R 2+  1+   1+  0  0 L 1+  1+   1+  0  0  Toes down  Coordination:  Normal finger to nose.  No dysdiadokinesia.  Sensation is intact to temperature and vibration except  in 4th and 5th digit of left hand.  Gait and Station are normal.  Tandem gait is slightly impaired.  Romberg is negative  MRI brain was reviewed both in 2007 and 2012.  Both reveal diffuse infiltrative process involving left insular cortex, left anterior temporal lobe, left mesial temporal lobe.  No obvious necrosis.  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  Impression/Recs: Mr. Riano clearly has two processes going on.  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  I have told the patient that the key here is that  he needs to keep pressure off the elbow and sleeping with some type of cushion wrapped around his elbow may help.  He is due to get an EMG/NCS at Dr. Dalphine Handing office which I expect will confirm the diagnosis.  I expect that he will have slow improvement of the neuropathy, but if not he could be considered for an ulnar transposition.  With respect to his infiltrative lesion I agree that pursuing an enhanced scan makes sense.  He will likely need a biopsy and obviously Dr. Jordan Likes is well versed at managing these.  As is typical with these types of lesions his exam does not reveal any dysfunction attributable to this lesion.  As it seems the Dr. Jordan Likes is managing both of these problems the patient will just follow up with [facility].  He can return to see me at any time if he or yourself has any questions or concerns.  Thank you for having [place] see Richard Hopkins in consultation.  Feel free to contact me with any questions.  Lupita Raider Modesto Charon, MD [facility], [place] [address] [place], [place] [address] Phone: [phone] Fax: [phone].

## 2011-09-29 ENCOUNTER — Other Ambulatory Visit: Payer: Self-pay | Admitting: Internal Medicine

## 2011-10-02 ENCOUNTER — Ambulatory Visit
Admission: RE | Admit: 2011-10-02 | Discharge: 2011-10-02 | Disposition: A | Discharge status: Home / Self Care | Payer: Medicare Other | Source: Ambulatory Visit | Attending: Neurosurgery | Admitting: Neurosurgery

## 2011-10-02 DIAGNOSIS — C719 Malignant neoplasm of brain, unspecified: Secondary | ICD-10-CM

## 2011-10-02 MED ORDER — GADOBENATE DIMEGLUMINE 529 MG/ML IV SOLN
20.0000 mL | Freq: Once | INTRAVENOUS | Status: AC | PRN
  Administered 2011-10-02: 20 mL via INTRAVENOUS

## 2011-12-08 ENCOUNTER — Encounter: Payer: Self-pay | Admitting: Internal Medicine

## 2011-12-08 ENCOUNTER — Other Ambulatory Visit: Payer: Self-pay | Admitting: Internal Medicine

## 2011-12-08 ENCOUNTER — Ambulatory Visit (INDEPENDENT_AMBULATORY_CARE_PROVIDER_SITE_OTHER): Payer: Medicare Other | Admitting: Internal Medicine

## 2011-12-08 VITALS — BP 120/70 | HR 84 | Temp 98.2°F | Ht 76.0 in | Wt 219.2 lb

## 2011-12-08 DIAGNOSIS — I10 Essential (primary) hypertension: Secondary | ICD-10-CM

## 2011-12-08 DIAGNOSIS — J449 Chronic obstructive pulmonary disease, unspecified: Secondary | ICD-10-CM

## 2011-12-08 DIAGNOSIS — J309 Allergic rhinitis, unspecified: Secondary | ICD-10-CM

## 2011-12-08 DIAGNOSIS — R29898 Other symptoms and signs involving the musculoskeletal system: Secondary | ICD-10-CM

## 2011-12-08 DIAGNOSIS — N400 Enlarged prostate without lower urinary tract symptoms: Secondary | ICD-10-CM

## 2011-12-08 DIAGNOSIS — Z23 Encounter for immunization: Secondary | ICD-10-CM

## 2011-12-08 NOTE — Assessment & Plan Note (Signed)
stable overall by hx and exam off the flonase,  to f/u any worsening symptoms or concerns \

## 2011-12-08 NOTE — Progress Notes (Signed)
Subjective:    Patient ID: Richard Hopkins, male    DOB: 05-12-1946, 65 y.o.   MRN: [id number]  HPI  Here to f/u; due for flu shot, but also mentions he cannot tolerate the cardua due to dizzy/low blood pressure, has also tried flomax but did not seem to help, and uroxatrol too expensive.   Declines urology for now as he doesn't have the supplement ins yet and cannot afford the copay.  Wants to quit smoking , and plans to try nicotine patch otc.  Has some insomina and wants to know if he can take otc benadryl.  Pt denies chest pain, increased sob or doe, wheezing, orthopnea, PND, increased LE swelling, palpitations, dizziness or syncope, as long as he takes the symbicort and prednisone.  Gets by without taking the flonase, and cannot afford the spiriva as well.  Pt denies new neurological symptoms such as new headache, or facial or extremity weakness or numbness   Pt denies polydipsia, polyuria. Due for flu shot today Denies worsening depressive symptoms, suicidal ideation, or panic.  Pt denies fever, wt loss, night sweats, loss of appetite, or other constitutional symptoms  Past Medical History  Diagnosis Date  . THRUSH 04/18/2008  . 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 DIABETES MELLITUS, TYPE II 09/28/2007  . HYPERLIPIDEMIA 09/28/2007  . SMOKER 08/30/2010  . BLURRED VISION 10/15/2010  . HYPERTENSION 09/28/2007  . ALLERGIC RHINITIS 09/28/2007  . CHRONIC OBSTRUCTIVE PULMONARY DISEASE, ACUTE EXACERBATION 04/18/2008  . COPD 09/28/2007  . 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 ESOPHAGEAL STRICTURE 09/28/2007  . GERD 09/28/2007  . DIVERTICULAR DISEASE 08/31/2007  . BENIGN PROSTATIC HYPERTROPHY 10/09/2008  . Acute prostatitis 04/01/2010  . EPIDIDYMO-ORCHITIS 04/01/2010  . SKIN LESION 01/19/2008  . LUMBAR DISC DISORDER 09/28/2007  . Pain in soft tissues of limb 01/29/2009  . MUSCLE CRAMPS 10/15/2010  . FATIGUE 09/28/2007  . MASS, SUPERFICIAL 11/06/2008  . DYSPNEA 08/20/2010  . Wheezing 02/14/2010  .  DYSPNEA/SHORTNESS OF BREATH 09/28/2007  . Nocturia 10/09/2008  . PSA, INCREASED 06/26/2010  . ABNORMAL ELECTROCARDIOGRAM 08/24/2009  . 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 SYNCOPE 01/16/2011  . Brain tumor 09/21/2011   Past Surgical History  Procedure Date  . Back surgury     x 4 disabled after 2003    reports that he has been smoking Cigarettes.  He has been smoking about 1 pack per day. He has never used smokeless tobacco. He reports that he drinks alcohol. He reports that he does not use illicit drugs. family history includes Diabetes in his mother and Hypertension in his other. Allergies  Allergen Reactions  . Ace Inhibitors   . Cardura (Doxazosin Mesylate)     dizzy  . Lisinopril     REACTION: cough   Current Outpatient Prescriptions on File Prior to Visit  Medication Sig Dispense Refill  . aspirin 81 MG EC tablet Take 81 mg by mouth daily.        . budesonide-formoterol (SYMBICORT) 160-4.5 MCG/ACT inhaler Inhale 2 puffs into the lungs 2 (two) times daily.  1 Inhaler  11  . cloNIDine (CATAPRES) 0.1 MG tablet Take 1 tablet (0.1 mg total) by mouth 2 (two) times daily.  180 tablet  3  . lovastatin (MEVACOR) 20 MG tablet TAKE TWO TABLETS BY MOUTH EVERY DAY IN THE EVENING  180 tablet  3  . metFORMIN (GLUCOPHAGE) 500 MG tablet Take 1 tablet (500 mg total) by mouth 2 (two) times daily with a meal.  180 tablet  3  . Omeprazole 20 MG TBEC Take 1 tablet (20 mg total) by mouth daily.  90 each  3  . predniSONE (DELTASONE) 10 MG tablet Take 0.5 tablets (5 mg total) by mouth daily. 1/2 by mouth once daily  90 tablet  3  . vardenafil (LEVITRA) 20 MG tablet Take 1 tablet (20 mg total) by mouth daily as needed.  10 tablet  5  . fluticasone (FLONASE) 50 MCG/ACT nasal spray Place 2 sprays into the nose daily.  16 g  2  . tiotropium (SPIRIVA HANDIHALER) 18 MCG inhalation capsule Place 1 capsule (18 mcg total) into inhaler and inhale daily.  90 capsule  3    Review of Systems Review of  Systems  Constitutional: Negative for diaphoresis and unexpected weight change.  HENT: Negative for drooling and tinnitus.   Eyes: Negative for photophobia and visual disturbance.  Respiratory: Negative for choking and stridor.   Gastrointestinal: Negative for vomiting and blood in stool.  Genitourinary: Negative for hematuria and decreased urine volume.  .      Objective:   Physical Exam BP 120/70  Pulse 84  Temp(Src) 98.2 F (36.8 C) (Oral)  Ht 6\' 4"  (1.93 m)  Wt 219 lb 4 oz (99.451 kg)  BMI 26.69 kg/m2  SpO2 93% Physical Exam  VS noted, not ill appearing Constitutional: Pt appears well-developed and well-nourished.  HENT: Head: Normocephalic.  Right Ear: External ear normal.  Left Ear: External ear normal.  Eyes: Conjunctivae and EOM are normal. Pupils are equal, round, and reactive to light.  Neck: Normal range of motion. Neck supple.  Cardiovascular: Normal rate and regular rhythm.   Pulmonary/Chest: Effort normal and breath sounds decrased but no rales or wheezing.  Abd:  Soft, NT, non-distended, + BS Neurological: Pt is alert. No cranial nerve deficit.  Skin: Skin is warm. No erythema.  Psychiatric: Pt behavior is normal. Thought content normal. 1+ nervous, not depressed appearing    Assessment & Plan:

## 2011-12-08 NOTE — Assessment & Plan Note (Signed)
stable overall by hx and exam, most recent data reviewed with pt, and pt to continue medical treatment as before  SpO2 Readings from Last 3 Encounters:  12/08/11 93%  09/17/11 94%  08/20/11 95%   Gave sample symbicort

## 2011-12-08 NOTE — Assessment & Plan Note (Signed)
improveing overall after ulnar nerve surgury per Dr Dutch Quint left elbow recently

## 2011-12-08 NOTE — Patient Instructions (Addendum)
You had the flu shot today You are given the spiriva sample today Continue all other medications as before OK to use the Nicotine patches OTC to help quit smoking Ok to use the benadryl OTC at night as needed for sleep Please call if you need the referral to Urology, as even mild uncontrolled symptoms can lead to bladder infection which can be severe Please return in 2 months, or sooner if needed, as you will be due for blood work

## 2011-12-08 NOTE — Assessment & Plan Note (Signed)
stable overall by hx and exam, most recent data reviewed with pt, and pt to continue medical treatment as before  BP Readings from Last 3 Encounters:  12/08/11 120/70  09/24/11 134/84  09/17/11 112/78

## 2011-12-08 NOTE — Assessment & Plan Note (Addendum)
With at least mild persistent symtpoms and nocturia 2-3, We did not have sample uroxatrol today, pt declines rx due to cost, I asked him to call if he needs urology referral

## 2012-01-22 ENCOUNTER — Ambulatory Visit (INDEPENDENT_AMBULATORY_CARE_PROVIDER_SITE_OTHER): Payer: Medicare Other | Admitting: Internal Medicine

## 2012-01-22 ENCOUNTER — Encounter: Payer: Self-pay | Admitting: Internal Medicine

## 2012-01-22 VITALS — BP 130/88 | HR 75 | Temp 97.3°F | Ht 76.0 in | Wt 221.5 lb

## 2012-01-22 DIAGNOSIS — J441 Chronic obstructive pulmonary disease with (acute) exacerbation: Secondary | ICD-10-CM

## 2012-01-22 DIAGNOSIS — Z0000 Encounter for general adult medical examination without abnormal findings: Secondary | ICD-10-CM

## 2012-01-22 DIAGNOSIS — J209 Acute bronchitis, unspecified: Secondary | ICD-10-CM

## 2012-01-22 DIAGNOSIS — E119 Type 2 diabetes mellitus without complications: Secondary | ICD-10-CM

## 2012-01-22 MED ORDER — METHYLPREDNISOLONE ACETATE PF 80 MG/ML IJ SUSP
120.0000 mg | Freq: Once | INTRAMUSCULAR | Status: AC
  Administered 2012-01-22: 120 mg via INTRAMUSCULAR

## 2012-01-22 MED ORDER — IPRATROPIUM-ALBUTEROL 0.5-2.5 (3) MG/3ML IN SOLN: 3.0000 mL | Freq: Four times a day (QID) | RESPIRATORY_TRACT | Status: DC | PRN

## 2012-01-22 MED ORDER — PREDNISONE 10 MG PO TABS: ORAL_TABLET | ORAL | Status: DC

## 2012-01-22 MED ORDER — AZITHROMYCIN 250 MG PO TABS: ORAL_TABLET | ORAL | Status: AC

## 2012-01-22 NOTE — Patient Instructions (Addendum)
You had the steroid shot today Take all new medications as prescribed - the prednisone pack, and antibiotic, as well as the inhaler medication as needed Please take the sample of Dulera 200 at 1 puff twice per day until gone in place of the symbicort If the [facility] discount card works with [employer], please call and the symbicort can be changed to [facility] Continue all other medications as before Please return in Feb 27 as planned with Lab testing done 3-5 days before

## 2012-01-25 ENCOUNTER — Encounter: Payer: Self-pay | Admitting: Internal Medicine

## 2012-01-25 NOTE — Progress Notes (Signed)
Subjective:    Patient ID: Richard Hopkins, male    DOB: 19-Feb-1946, 66 y.o.   MRN: [id number]  HPI  Here with acute onset mild to mod 2-3 days ST, HA, general weakness and malaise, with prod cough greenish sputum, but Pt denies chest pain, increased sob or doe, wheezing, orthopnea, PND, increased LE swelling, palpitations, dizziness or syncope, except for mild wheeze onset today with midl sob.  Pt denies new neurological symptoms such as new headache, or facial or extremity weakness or numbness   Pt denies polydipsia, polyuria,   Pt states overall good compliance with meds, trying to follow lower cholesterol, diabetic diet, wt overall stable but little exercise however.    Past Medical History  Diagnosis Date  . THRUSH 04/18/2008  . 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 DIABETES MELLITUS, TYPE II 09/28/2007  . HYPERLIPIDEMIA 09/28/2007  . SMOKER 08/30/2010  . BLURRED VISION 10/15/2010  . HYPERTENSION 09/28/2007  . ALLERGIC RHINITIS 09/28/2007  . CHRONIC OBSTRUCTIVE PULMONARY DISEASE, ACUTE EXACERBATION 04/18/2008  . COPD 09/28/2007  . 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 ESOPHAGEAL STRICTURE 09/28/2007  . GERD 09/28/2007  . DIVERTICULAR DISEASE 08/31/2007  . BENIGN PROSTATIC HYPERTROPHY 10/09/2008  . Acute prostatitis 04/01/2010  . EPIDIDYMO-ORCHITIS 04/01/2010  . SKIN LESION 01/19/2008  . LUMBAR DISC DISORDER 09/28/2007  . Pain in soft tissues of limb 01/29/2009  . MUSCLE CRAMPS 10/15/2010  . FATIGUE 09/28/2007  . MASS, SUPERFICIAL 11/06/2008  . DYSPNEA 08/20/2010  . Wheezing 02/14/2010  . DYSPNEA/SHORTNESS OF BREATH 09/28/2007  . Nocturia 10/09/2008  . PSA, INCREASED 06/26/2010  . ABNORMAL ELECTROCARDIOGRAM 08/24/2009  . 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 SYNCOPE 01/16/2011  . Brain tumor 09/21/2011   Past Surgical History  Procedure Date  . Back surgury     x 4 disabled after 2003    reports that he has been smoking Cigarettes.  He has been smoking about 1 pack per day. He has never  used smokeless tobacco. He reports that he drinks alcohol. He reports that he does not use illicit drugs. family history includes Diabetes in his mother and Hypertension in his other. Allergies  Allergen Reactions  . Ace Inhibitors   . Cardura (Doxazosin Mesylate)     dizzy  . Lisinopril     REACTION: cough   Current Outpatient Prescriptions on File Prior to Visit  Medication Sig Dispense Refill  . aspirin 81 MG EC tablet Take 81 mg by mouth daily.        . budesonide-formoterol (SYMBICORT) 160-4.5 MCG/ACT inhaler Inhale 2 puffs into the lungs 2 (two) times daily.  1 Inhaler  11  . cloNIDine (CATAPRES) 0.1 MG tablet Take 1 tablet (0.1 mg total) by mouth 2 (two) times daily.  180 tablet  3  . lovastatin (MEVACOR) 20 MG tablet TAKE TWO TABLETS BY MOUTH EVERY DAY IN THE EVENING  180 tablet  3  . metFORMIN (GLUCOPHAGE) 500 MG tablet Take 1 tablet (500 mg total) by mouth 2 (two) times daily with a meal.  180 tablet  3  . Omeprazole 20 MG TBEC Take 1 tablet (20 mg total) by mouth daily.  90 each  3  . predniSONE (DELTASONE) 10 MG tablet Take 0.5 tablets (5 mg total) by mouth daily. 1/2 by mouth once daily  90 tablet  3  . vardenafil (LEVITRA) 20 MG tablet Take 1 tablet (20 mg total) by mouth daily as needed.  10 tablet  5   Review of Systems Review of  Systems  Constitutional: Negative for diaphoresis and unexpected weight change.  HENT: Negative for drooling and tinnitus.   Eyes: Negative for photophobia and visual disturbance.  Respiratory: Negative for choking and stridor.   Gastrointestinal: Negative for vomiting and blood in stool.  Genitourinary: Negative for hematuria and decreased urine volume.     Objective:   Physical Exam BP 130/88  Pulse 75  Temp(Src) 97.3 F (36.3 C) (Oral)  Ht 6\' 4"  (1.93 m)  Wt 221 lb 8 oz (100.472 kg)  BMI 26.96 kg/m2  SpO2 95% Physical Exam  VS noted, mild ill Constitutional: Pt appears well-developed and well-nourished.  HENT: Head: Normocephalic.   Right Ear: External ear normal.  Left Ear: External ear normal.  Bilat tm's mild erythema.  Sinus nontender.  Pharynx mild erythema Eyes: Conjunctivae and EOM are normal. Pupils are equal, round, and reactive to light.  Neck: Normal range of motion. Neck supple.  Cardiovascular: Normal rate and regular rhythm.   Pulmonary/Chest: Effort normal and breath sounds mild decreased with mild wheeze.  Neurological: Pt is alert. No cranial nerve deficit.  Skin: Skin is warm. No erythema.  Psychiatric: Pt behavior is normal. Thought content normal.     Assessment & Plan:

## 2012-01-25 NOTE — Assessment & Plan Note (Signed)
Mild to mod, for antibx course,  to f/u any worsening symptoms or concerns 

## 2012-01-25 NOTE — Assessment & Plan Note (Signed)
Mild to mod, for depomedrol IM, and predpack, cont inhaler, to f/u any worsening symptoms or concerns

## 2012-01-25 NOTE — Assessment & Plan Note (Signed)
stable overall by hx and exam, most recent data reviewed with pt, and pt to continue medical treatment as before, and pt to call for cbg > 200 with current tx  Lab Results  Component Value Date   HGBA1C 6.5 01/16/2011

## 2012-02-18 ENCOUNTER — Ambulatory Visit: Payer: Medicare Other | Admitting: Internal Medicine

## 2012-04-01 ENCOUNTER — Ambulatory Visit (INDEPENDENT_AMBULATORY_CARE_PROVIDER_SITE_OTHER): Payer: Medicare Other | Admitting: Internal Medicine

## 2012-04-01 ENCOUNTER — Encounter: Payer: Self-pay | Admitting: Internal Medicine

## 2012-04-01 ENCOUNTER — Other Ambulatory Visit (INDEPENDENT_AMBULATORY_CARE_PROVIDER_SITE_OTHER): Payer: Medicare Other

## 2012-04-01 VITALS — BP 130/70 | HR 87 | Temp 98.0°F | Ht 76.0 in | Wt 223.2 lb

## 2012-04-01 DIAGNOSIS — N400 Enlarged prostate without lower urinary tract symptoms: Secondary | ICD-10-CM

## 2012-04-01 DIAGNOSIS — E785 Hyperlipidemia, unspecified: Secondary | ICD-10-CM

## 2012-04-01 DIAGNOSIS — J441 Chronic obstructive pulmonary disease with (acute) exacerbation: Secondary | ICD-10-CM

## 2012-04-01 DIAGNOSIS — Z0000 Encounter for general adult medical examination without abnormal findings: Secondary | ICD-10-CM

## 2012-04-01 DIAGNOSIS — E119 Type 2 diabetes mellitus without complications: Secondary | ICD-10-CM

## 2012-04-01 LAB — CBC WITH DIFFERENTIAL/PLATELET
Basophils Absolute: 0 10*3/uL (ref 0.0–0.1)
Eosinophils Absolute: 0.6 10*3/uL (ref 0.0–0.7)
MCHC: 32.1 g/dL (ref 30.0–36.0)
MCV: 87.5 fl (ref 78.0–100.0)
Monocytes Absolute: 1.2 10*3/uL — ABNORMAL HIGH (ref 0.1–1.0)
Neutrophils Relative %: 69.4 % (ref 43.0–77.0)
Platelets: 243 10*3/uL (ref 150.0–400.0)
RDW: 15 % — ABNORMAL HIGH (ref 11.5–14.6)
WBC: 11.2 10*3/uL — ABNORMAL HIGH (ref 4.5–10.5)

## 2012-04-01 LAB — HEPATIC FUNCTION PANEL
ALT: 15 U/L (ref 0–53)
AST: 16 U/L (ref 0–37)
Bilirubin, Direct: 0.1 mg/dL (ref 0.0–0.3)
Total Bilirubin: 0.4 mg/dL (ref 0.3–1.2)

## 2012-04-01 LAB — URINALYSIS, ROUTINE W REFLEX MICROSCOPIC
Hgb urine dipstick: NEGATIVE
Leukocytes, UA: NEGATIVE
Nitrite: NEGATIVE

## 2012-04-01 LAB — MICROALBUMIN / CREATININE URINE RATIO: Microalb, Ur: 0.7 mg/dL (ref 0.0–1.9)

## 2012-04-01 LAB — LIPID PANEL
Cholesterol: 155 mg/dL (ref 0–200)
LDL Cholesterol: 53 mg/dL (ref 0–99)

## 2012-04-01 LAB — BASIC METABOLIC PANEL
BUN: 16 mg/dL (ref 6–23)
Chloride: 104 mEq/L (ref 96–112)
Potassium: 3.8 mEq/L (ref 3.5–5.1)

## 2012-04-01 LAB — TSH: TSH: 0.87 u[IU]/mL (ref 0.35–5.50)

## 2012-04-01 MED ORDER — FLUTICASONE-SALMETEROL 250-50 MCG/DOSE IN AEPB: 1.0000 | INHALATION_SPRAY | Freq: Two times a day (BID) | RESPIRATORY_TRACT | Status: DC

## 2012-04-01 NOTE — Progress Notes (Signed)
Subjective:    Patient ID: Richard Hopkins, male    DOB: 1946-09-04, 66 y.o.   MRN: [id number]  HPI  Here for wellness and f/u;  Overall doing ok;  Pt denies CP, orthopnea, PND, worsening LE edema, palpitations, dizziness or syncope.  Pt denies neurological change such as new Headache, facial or extremity weakness.  Pt denies polydipsia, polyuria, or low sugar symptoms. Pt states overall good compliance with treatment and medications, good tolerability, and trying to follow lower cholesterol diet.  Pt denies worsening depressive symptoms, suicidal ideation or panic. No fever, wt loss, night sweats, loss of appetite, or other constitutional symptoms.  Pt states good ability with ADL's, low fall risk, home safety reviewed and adequate, no significant changes in hearing or vision, and occasionally active with exercise.  Going to the GYM 4 days per wk,  Feels the best with steroid tx with more energy.  Symbicort too expensive, dulera did not seem to help well, still sob/wheeze intermittent Past Medical History  Diagnosis Date  . THRUSH 04/18/2008  . 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 DIABETES MELLITUS, TYPE II 09/28/2007  . HYPERLIPIDEMIA 09/28/2007  . SMOKER 08/30/2010  . BLURRED VISION 10/15/2010  . HYPERTENSION 09/28/2007  . ALLERGIC RHINITIS 09/28/2007  . CHRONIC OBSTRUCTIVE PULMONARY DISEASE, ACUTE EXACERBATION 04/18/2008  . COPD 09/28/2007  . 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 ESOPHAGEAL STRICTURE 09/28/2007  . GERD 09/28/2007  . DIVERTICULAR DISEASE 08/31/2007  . BENIGN PROSTATIC HYPERTROPHY 10/09/2008  . Acute prostatitis 04/01/2010  . EPIDIDYMO-ORCHITIS 04/01/2010  . SKIN LESION 01/19/2008  . LUMBAR DISC DISORDER 09/28/2007  . Pain in soft tissues of limb 01/29/2009  . MUSCLE CRAMPS 10/15/2010  . FATIGUE 09/28/2007  . MASS, SUPERFICIAL 11/06/2008  . DYSPNEA 08/20/2010  . Wheezing 02/14/2010  . DYSPNEA/SHORTNESS OF BREATH 09/28/2007  . Nocturia 10/09/2008  . PSA, INCREASED 06/26/2010  . ABNORMAL  ELECTROCARDIOGRAM 08/24/2009  . 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 SYNCOPE 01/16/2011  . Brain tumor 09/21/2011   Past Surgical History  Procedure Date  . Back surgury     x 4 disabled after 2003    reports that he has been smoking Cigarettes.  He has been smoking about 1 pack per day. He has never used smokeless tobacco. He reports that he drinks alcohol. He reports that he does not use illicit drugs. family history includes Diabetes in his mother and Hypertension in his other. Allergies  Allergen Reactions  . Ace Inhibitors   . Cardura (Doxazosin Mesylate)     dizzy  . Hydrocodone Other (See Comments)    nauasea and dizziness  . Lisinopril     REACTION: cough   Current Outpatient Prescriptions on File Prior to Visit  Medication Sig Dispense Refill  . aspirin 81 MG EC tablet Take 81 mg by mouth daily.        . cloNIDine (CATAPRES) 0.1 MG tablet Take 1 tablet (0.1 mg total) by mouth 2 (two) times daily.  180 tablet  3  . ipratropium-albuterol (DUONEB) 0.5-2.5 (3) MG/3ML SOLN Take 3 mLs by nebulization every 6 (six) hours as needed.  360 mL  5  . lovastatin (MEVACOR) 20 MG tablet TAKE TWO TABLETS BY MOUTH EVERY DAY IN THE EVENING  180 tablet  3  . metFORMIN (GLUCOPHAGE) 500 MG tablet Take 1 tablet (500 mg total) by mouth 2 (two) times daily with a meal.  180 tablet  3  . Omeprazole 20 MG TBEC Take 1 tablet (20 mg total) by mouth  daily.  90 each  3  . predniSONE (DELTASONE) 10 MG tablet Take 0.5 tablets (5 mg total) by mouth daily. 1/2 by mouth once daily  90 tablet  3  . vardenafil (LEVITRA) 20 MG tablet Take 1 tablet (20 mg total) by mouth daily as needed.  10 tablet  5  . Fluticasone-Salmeterol (ADVAIR DISKUS) 250-50 MCG/DOSE AEPB Inhale 1 puff into the lungs 2 (two) times daily.  1 each  11   Review of Systems Review of Systems  Constitutional: Negative for diaphoresis, activity change, appetite change and unexpected weight change.  HENT: Negative for hearing loss,  ear pain, facial swelling, mouth sores and neck stiffness.   Eyes: Negative for pain, redness and visual disturbance.  Respiratory: Negative for shortness of breath and wheezing.   Cardiovascular: Negative for chest pain and palpitations.  Gastrointestinal: Negative for diarrhea, blood in stool, abdominal distention and rectal pain.  Genitourinary: Negative for hematuria, flank pain and decreased urine volume.  Musculoskeletal: Negative for myalgias and joint swelling.  Skin: Negative for color change and wound.  Neurological: Negative for syncope and numbness.  Hematological: Negative for adenopathy.  Psychiatric/Behavioral: Negative for hallucinations, self-injury, decreased concentration and agitation.      Objective:   Physical Exam BP 130/70  Pulse 87  Temp(Src) 98 F (36.7 C) (Oral)  Ht 6\' 4"  (1.93 m)  Wt 223 lb 4 oz (101.266 kg)  BMI 27.17 kg/m2  SpO2 93% Physical Exam  VS noted, not ill appearing Constitutional: Pt is oriented to person, place, and time. Appears well-developed and well-nourished.  HENT:  Head: Normocephalic and atraumatic.  Right Ear: External ear normal.  Left Ear: External ear normal.  Nose: Nose normal.  Mouth/Throat: Oropharynx is clear and moist.  Eyes: Conjunctivae and EOM are normal. Pupils are equal, round, and reactive to light.  Neck: Normal range of motion. Neck supple. No JVD present. No tracheal deviation present.  Cardiovascular: Normal rate, regular rhythm, normal heart sounds and intact distal pulses.   Pulmonary/Chest: Effort normal and breath sounds mild decreased, no wheezing Abdominal: Soft. Bowel sounds are normal. There is no tenderness.  Musculoskeletal: Normal range of motion. Exhibits no edema.  Lymphadenopathy:  Has no cervical adenopathy.  Neurological: Pt is alert and oriented to person, place, and time. Pt has normal reflexes. No cranial nerve deficit.  Skin: Skin is warm and dry. No rash noted.  Psychiatric: . Behavior is  normal.  1+ nervous     Assessment & Plan:

## 2012-04-01 NOTE — Assessment & Plan Note (Signed)
Very mild, mostly due to not being able to afford symbicort, and now states dulera not helping;  Gave sample for advair 250 bid, ok to bump the prendisone to 10 qd for 1 wk, then resume 5 qd

## 2012-04-01 NOTE — Patient Instructions (Addendum)
Please stop smoking; you can use the OTC nicotine patches You are given the advair sample to use twice per day until finished, and a new prescription is sent to your pharmacy Please increase the prednisone 10 mg pill to a whole pill per day for 1 wk, then resume the Half pill per day after that You are other wise up to date with prevention Please have the pharmacy call with any refills you may need. Please go to LAB in the Basement for the blood and/or urine tests to be done today You will be contacted by phone if any changes need to be made immediately.  Otherwise, you will receive a letter about your results with an explanation. Please return in 6 mo with Lab testing done 3-5 days before

## 2012-04-02 ENCOUNTER — Encounter: Payer: Self-pay | Admitting: Internal Medicine

## 2012-04-02 LAB — HEMOGLOBIN A1C: Hgb A1c MFr Bld: 6.5 % (ref 4.6–6.5)

## 2012-04-03 ENCOUNTER — Encounter: Payer: Self-pay | Admitting: Internal Medicine

## 2012-04-03 NOTE — Assessment & Plan Note (Signed)

## 2012-04-03 NOTE — Assessment & Plan Note (Signed)
stable overall by hx and exam, most recent data reviewed with pt, and pt to continue medical treatment as before  Lab Results  Component Value Date   HGBA1C 6.5 04/01/2012

## 2012-05-06 ENCOUNTER — Encounter: Payer: Self-pay | Admitting: Internal Medicine

## 2012-05-06 ENCOUNTER — Ambulatory Visit (INDEPENDENT_AMBULATORY_CARE_PROVIDER_SITE_OTHER): Payer: Medicare Other | Admitting: Internal Medicine

## 2012-05-06 VITALS — BP 132/84 | HR 100 | Temp 98.2°F | Ht 76.0 in | Wt 220.2 lb

## 2012-05-06 DIAGNOSIS — L989 Disorder of the skin and subcutaneous tissue, unspecified: Secondary | ICD-10-CM

## 2012-05-06 DIAGNOSIS — M79645 Pain in left finger(s): Secondary | ICD-10-CM

## 2012-05-06 DIAGNOSIS — R21 Rash and other nonspecific skin eruption: Secondary | ICD-10-CM

## 2012-05-06 DIAGNOSIS — M79609 Pain in unspecified limb: Secondary | ICD-10-CM

## 2012-05-06 DIAGNOSIS — J449 Chronic obstructive pulmonary disease, unspecified: Secondary | ICD-10-CM

## 2012-05-06 DIAGNOSIS — J4489 Other specified chronic obstructive pulmonary disease: Secondary | ICD-10-CM

## 2012-05-06 MED ORDER — CLOTRIMAZOLE-BETAMETHASONE 1-0.05 % EX CREA: TOPICAL_CREAM | CUTANEOUS | Status: AC

## 2012-05-06 NOTE — Patient Instructions (Addendum)
Take all new medications as prescribed - the cream Continue all other medications as before You can also use tylenol for the left thumb arthritis You can consider a referral to Hand Surgury for the cyst on the right thumb, just let me know if you want to be referred

## 2012-05-06 NOTE — Assessment & Plan Note (Signed)
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

## 2012-05-06 NOTE — Assessment & Plan Note (Signed)
C/w subq cystic mass near the nail, but not inflamed, no need for specific tx at this time, d/w pt, consider derm referral but declines at this time

## 2012-05-06 NOTE — Assessment & Plan Note (Signed)
stable overall by hx and exam, most recent data reviewed with pt, and pt to continue medical treatment as before SpO2 Readings from Last 3 Encounters:  05/06/12 97%  04/01/12 93%  01/22/12 95%
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
## 2012-05-06 NOTE — Assessment & Plan Note (Signed)
Mild to mod, for lotrisone prn,  to f/u any worsening symptoms or concerns 

## 2012-05-30 ENCOUNTER — Other Ambulatory Visit: Payer: Self-pay | Admitting: Internal Medicine

## 2012-05-31 NOTE — Telephone Encounter (Signed)
Done

## 2012-07-02 ENCOUNTER — Encounter: Payer: Self-pay | Admitting: Internal Medicine

## 2012-07-23 ENCOUNTER — Other Ambulatory Visit: Payer: Self-pay | Admitting: Internal Medicine

## 2012-08-19 ENCOUNTER — Ambulatory Visit: Payer: Medicare Other | Admitting: Internal Medicine

## 2012-08-19 ENCOUNTER — Other Ambulatory Visit: Payer: Self-pay | Admitting: Internal Medicine

## 2012-09-16 ENCOUNTER — Ambulatory Visit (INDEPENDENT_AMBULATORY_CARE_PROVIDER_SITE_OTHER): Payer: Medicare Other | Admitting: Internal Medicine

## 2012-09-16 ENCOUNTER — Encounter: Payer: Self-pay | Admitting: Internal Medicine

## 2012-09-16 ENCOUNTER — Other Ambulatory Visit (INDEPENDENT_AMBULATORY_CARE_PROVIDER_SITE_OTHER): Payer: Medicare Other

## 2012-09-16 VITALS — BP 120/78 | HR 76 | Temp 99.0°F | Ht 76.0 in | Wt 220.4 lb

## 2012-09-16 DIAGNOSIS — M7042 Prepatellar bursitis, left knee: Secondary | ICD-10-CM | POA: Insufficient documentation

## 2012-09-16 DIAGNOSIS — R351 Nocturia: Secondary | ICD-10-CM

## 2012-09-16 DIAGNOSIS — E119 Type 2 diabetes mellitus without complications: Secondary | ICD-10-CM

## 2012-09-16 DIAGNOSIS — M7040 Prepatellar bursitis, unspecified knee: Secondary | ICD-10-CM

## 2012-09-16 DIAGNOSIS — Z23 Encounter for immunization: Secondary | ICD-10-CM

## 2012-09-16 DIAGNOSIS — N400 Enlarged prostate without lower urinary tract symptoms: Secondary | ICD-10-CM

## 2012-09-16 DIAGNOSIS — Z0000 Encounter for general adult medical examination without abnormal findings: Secondary | ICD-10-CM

## 2012-09-16 DIAGNOSIS — B370 Candidal stomatitis: Secondary | ICD-10-CM

## 2012-09-16 DIAGNOSIS — I10 Essential (primary) hypertension: Secondary | ICD-10-CM

## 2012-09-16 LAB — LIPID PANEL
HDL: 69.5 mg/dL (ref >39.00)
Total CHOL/HDL Ratio: 2
Triglycerides: 115 mg/dL (ref 0.0–149.0)
VLDL: 23 mg/dL (ref 0.0–40.0)

## 2012-09-16 LAB — BASIC METABOLIC PANEL
CO2: 26 mEq/L (ref 19–32)
Calcium: 9.4 mg/dL (ref 8.4–10.5)
Creatinine, Ser: 1.2 mg/dL (ref 0.4–1.5)
GFR: 78.8 mL/min (ref >60.00)
Glucose, Bld: 80 mg/dL (ref 70–99)
Sodium: 139 mEq/L (ref 135–145)

## 2012-09-16 MED ORDER — PREDNISONE 10 MG PO TABS: ORAL_TABLET | ORAL | Status: DC

## 2012-09-16 MED ORDER — NYSTATIN 100000 UNIT/ML MT SUSP: 500000.0000 [IU] | Freq: Four times a day (QID) | OROMUCOSAL | Status: DC

## 2012-09-16 MED ORDER — METHYLPREDNISOLONE ACETATE 80 MG/ML IJ SUSP
120.0000 mg | Freq: Once | INTRAMUSCULAR | Status: AC
  Administered 2012-09-16: 120 mg via INTRAMUSCULAR

## 2012-09-16 MED ORDER — TRAMADOL HCL 50 MG PO TABS: 50.0000 mg | ORAL_TABLET | Freq: Four times a day (QID) | ORAL | Status: DC | PRN

## 2012-09-16 NOTE — Assessment & Plan Note (Signed)
Ok for nystatin asd 

## 2012-09-16 NOTE — Assessment & Plan Note (Signed)
stable overall by hx and exam, most recent data reviewed with pt, and pt to continue medical treatment as before BP Readings from Last 3 Encounters:  09/16/12 120/78  05/06/12 132/84  04/01/12 130/70

## 2012-09-16 NOTE — Patient Instructions (Addendum)
You had the flu shot today, and the steroid shot today Take all new medications as prescribed - the prednisone, and pain medication (generic tramadol), and the mouthwash Please call in 1-2 weeks if not improved, for orthopedic referral You will be contacted regarding the referral for: urology Please return in 6 mo with Lab testing done 3-5 days before

## 2012-09-16 NOTE — Assessment & Plan Note (Signed)
With mild gradually worsening symptoms and nocuturia, not better with trial flomax, ok for urology referral

## 2012-09-16 NOTE — Assessment & Plan Note (Signed)
stable overall by hx and exam, most recent data reviewed with pt, and pt to continue medical treatment as before Lab Results  Component Value Date   HGBA1C 6.5 09/16/2012    

## 2012-09-16 NOTE — Assessment & Plan Note (Signed)
Mild to mod, for pain control, predpack, consider ortho referral, to f/u any worsening symptoms or concerns
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
## 2012-09-17 ENCOUNTER — Encounter: Payer: Self-pay | Admitting: Internal Medicine

## 2012-09-30 ENCOUNTER — Ambulatory Visit: Payer: Medicare Other | Admitting: Internal Medicine

## 2012-10-30 ENCOUNTER — Other Ambulatory Visit: Payer: Self-pay | Admitting: Internal Medicine

## 2012-12-01 ENCOUNTER — Encounter: Payer: Self-pay | Admitting: Internal Medicine

## 2012-12-01 ENCOUNTER — Ambulatory Visit (INDEPENDENT_AMBULATORY_CARE_PROVIDER_SITE_OTHER): Payer: Medicare Other | Admitting: Internal Medicine

## 2012-12-01 VITALS — BP 132/88 | HR 72 | Temp 98.4°F | Ht 76.0 in | Wt 229.4 lb

## 2012-12-01 DIAGNOSIS — F17200 Nicotine dependence, unspecified, uncomplicated: Secondary | ICD-10-CM

## 2012-12-01 DIAGNOSIS — E119 Type 2 diabetes mellitus without complications: Secondary | ICD-10-CM

## 2012-12-01 DIAGNOSIS — E785 Hyperlipidemia, unspecified: Secondary | ICD-10-CM

## 2012-12-01 DIAGNOSIS — J449 Chronic obstructive pulmonary disease, unspecified: Secondary | ICD-10-CM

## 2012-12-01 DIAGNOSIS — I10 Essential (primary) hypertension: Secondary | ICD-10-CM

## 2012-12-01 MED ORDER — VARENICLINE TARTRATE 1 MG PO TABS: 1.0000 mg | ORAL_TABLET | Freq: Two times a day (BID) | ORAL | Status: DC

## 2012-12-01 MED ORDER — VARENICLINE TARTRATE 0.5 MG PO TABS: 0.5000 mg | ORAL_TABLET | Freq: Two times a day (BID) | ORAL | Status: DC

## 2012-12-01 MED ORDER — PREDNISONE 10 MG PO TABS: 10.0000 mg | ORAL_TABLET | Freq: Every day | ORAL | Status: DC

## 2012-12-01 NOTE — Assessment & Plan Note (Signed)
stable overall by hx and exam, most recent data reviewed with pt, and pt to continue medical treatment as before BP Readings from Last 3 Encounters:  12/01/12 132/88  09/16/12 120/78  05/06/12 132/84

## 2012-12-01 NOTE — Assessment & Plan Note (Signed)
Ok for chantix asd 

## 2012-12-01 NOTE — Assessment & Plan Note (Signed)
stable overall by hx and exam, most recent data reviewed with pt, and pt to continue medical treatment as before Lab Results  Component Value Date   LDLCALC 71 09/16/2012

## 2012-12-01 NOTE — Patient Instructions (Addendum)
Take all new medications as prescribed - the chantix (given in the hardcopy) Continue all other medications as before, including the prednisone 10 mg (sent to the pharmacy) Please continue your efforts at being more active, low cholesterol diet, and weight control.
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
## 2012-12-01 NOTE — Assessment & Plan Note (Signed)
stable overall by hx and exam, most recent data reviewed with pt, and pt to continue medical treatment as before, prednisone refilled

## 2012-12-01 NOTE — Assessment & Plan Note (Signed)
stable overall by hx and exam, most recent data reviewed with pt, and pt to continue medical treatment as before Lab Results  Component Value Date   HGBA1C 6.5 09/16/2012

## 2012-12-16 ENCOUNTER — Other Ambulatory Visit: Payer: Self-pay | Admitting: Internal Medicine

## 2012-12-21 ENCOUNTER — Ambulatory Visit (INDEPENDENT_AMBULATORY_CARE_PROVIDER_SITE_OTHER)
Admission: RE | Admit: 2012-12-21 | Discharge: 2012-12-21 | Disposition: A | Discharge status: Home / Self Care | Payer: Medicare Other | Source: Ambulatory Visit | Attending: Internal Medicine | Admitting: Internal Medicine

## 2012-12-21 ENCOUNTER — Ambulatory Visit (INDEPENDENT_AMBULATORY_CARE_PROVIDER_SITE_OTHER): Payer: Medicare Other | Admitting: Internal Medicine

## 2012-12-21 ENCOUNTER — Encounter: Payer: Self-pay | Admitting: Internal Medicine

## 2012-12-21 VITALS — BP 144/80 | HR 84 | Temp 97.3°F | Wt 230.2 lb

## 2012-12-21 DIAGNOSIS — J209 Acute bronchitis, unspecified: Secondary | ICD-10-CM

## 2012-12-21 MED ORDER — LEVOFLOXACIN 500 MG PO TABS: 500.0000 mg | ORAL_TABLET | Freq: Every day | ORAL | Status: DC

## 2012-12-21 NOTE — Patient Instructions (Addendum)

## 2012-12-21 NOTE — Progress Notes (Signed)
Subjective:    Patient ID: Richard Hopkins, male    DOB: [birth year], 66 y.o.   MRN: [id number]  HPI  Pt presents to the clinic today with c/o of pain in his rib cage on his left side. He has had a cough, but is not bringing up much sputum. He has not taken his temperature but does report having chills. The cough is worse when he lays downs at night. He has not taken anything for the cough or for the pain. He does have a history of COPD and does get acute bronchitis infections as well as pneumonia. He has had sick contacts.  Review of Systems      Past Medical History  Diagnosis Date  . THRUSH 04/18/2008  . 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 DIABETES MELLITUS, TYPE II 09/28/2007  . HYPERLIPIDEMIA 09/28/2007  . SMOKER 08/30/2010  . BLURRED VISION 10/15/2010  . HYPERTENSION 09/28/2007  . ALLERGIC RHINITIS 09/28/2007  . CHRONIC OBSTRUCTIVE PULMONARY DISEASE, ACUTE EXACERBATION 04/18/2008  . COPD 09/28/2007  . 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 ESOPHAGEAL STRICTURE 09/28/2007  . GERD 09/28/2007  . DIVERTICULAR DISEASE 08/31/2007  . BENIGN PROSTATIC HYPERTROPHY 10/09/2008  . Acute prostatitis 04/01/2010  . EPIDIDYMO-ORCHITIS 04/01/2010  . SKIN LESION 01/19/2008  . LUMBAR DISC DISORDER 09/28/2007  . Pain in soft tissues of limb 01/29/2009  . MUSCLE CRAMPS 10/15/2010  . FATIGUE 09/28/2007  . MASS, SUPERFICIAL 11/06/2008  . DYSPNEA 08/20/2010  . Wheezing 02/14/2010  . DYSPNEA/SHORTNESS OF BREATH 09/28/2007  . Nocturia 10/09/2008  . PSA, INCREASED 06/26/2010  . ABNORMAL ELECTROCARDIOGRAM 08/24/2009  . 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 SYNCOPE 01/16/2011  . Brain tumor 09/21/2011    Current Outpatient Prescriptions  Medication Sig Dispense Refill  . aspirin 81 MG EC tablet Take 81 mg by mouth daily.        . cloNIDine (CATAPRES) 0.1 MG tablet TAKE ONE TABLET BY MOUTH TWICE DAILY  180 tablet  2  . clotrimazole-betamethasone (LOTRISONE) cream Use as directed twice per day as needed  15 g   1  . Fluticasone-Salmeterol (ADVAIR DISKUS) 250-50 MCG/DOSE AEPB Inhale 1 puff into the lungs 2 (two) times daily.  1 each  11  . ipratropium-albuterol (DUONEB) 0.5-2.5 (3) MG/3ML SOLN Take 3 mLs by nebulization every 6 (six) hours as needed.  360 mL  5  . lovastatin (MEVACOR) 20 MG tablet TAKE TWO TABLETS BY MOUTH EVERY DAY IN THE EVENING  180 tablet  3  . metFORMIN (GLUCOPHAGE) 500 MG tablet Take 1 tablet (500 mg total) by mouth 2 (two) times daily with a meal.  180 tablet  3  . Omeprazole 20 MG TBEC Take 1 tablet (20 mg total) by mouth daily.  90 each  3  . predniSONE (DELTASONE) 10 MG tablet Take 1 tablet (10 mg total) by mouth daily.  90 tablet  3  . traMADol (ULTRAM) 50 MG tablet Take 1 tablet (50 mg total) by mouth every 6 (six) hours as needed for pain.  60 tablet  1  . vardenafil (LEVITRA) 20 MG tablet Take 1 tablet (20 mg total) by mouth daily as needed.  10 tablet  5  . varenicline (CHANTIX CONTINUING MONTH PAK) 1 MG tablet Take 1 tablet (1 mg total) by mouth 2 (two) times daily.  60 tablet  1  . varenicline (CHANTIX) 0.5 MG tablet Take 1 tablet (0.5 mg total) by mouth 2 (two) times daily.  60 tablet  0    Allergies  Allergen Reactions  . Ace Inhibitors   . Cardura (Doxazosin Mesylate)     dizzy  . Hydrocodone Other (See Comments)    nauasea and dizziness  . Lisinopril     REACTION: cough    Family History  Problem Relation Age of Onset  . Diabetes Mother   . Hypertension Other     History   Social History  . Marital Status: Married    Spouse Name: N/A    Number of Children: N/A  . Years of Education: N/A   Occupational History  . disabled    Social History Main Topics  . Smoking status: Current Every Day Smoker -- 1.0 packs/day    Types: Cigarettes  . Smokeless tobacco: Never Used  . Alcohol Use: Yes  . Drug Use: No  . Sexually Active: Not on file   Other Topics Concern  . Not on file   Social History Narrative   Patient does not get regular  exerciseDaily caffeine use     Constitutional: Pt reports fatigue. Denies fever, malaise, headache or abrupt weight changes.  HEENT: Denies eye pain, eye redness, ear pain, ringing in the ears, wax buildup, runny nose, nasal congestion, bloody nose, or sore throat. Respiratory: Pt reports cough. Denies difficulty breathing, shortness of breath, .   Cardiovascular: Pt reports chest tightness. Denies chest pain, palpitations or swelling in the hands or feet.  Gastrointestinal: Denies abdominal pain, bloating, constipation, diarrhea or blood in the stool.   No other specific complaints in a complete review of systems (except as listed in HPI above).  Objective:   Physical Exam  BP 144/80  Pulse 84  Temp 97.3 F (36.3 C) (Oral)  Wt 230 lb 3.2 oz (104.418 kg)  SpO2 97% Wt Readings from Last 3 Encounters:  12/21/12 230 lb 3.2 oz (104.418 kg)  12/01/12 229 lb 6 oz (104.044 kg)  09/16/12 220 lb 6 oz (99.961 kg)    General: Appears his stated age, well developed, well nourished in NAD. HEENT: Head: normal shape and size; Eyes: sclera white, no icterus, conjunctiva pink, PERRLA and EOMs intact; Ears: Tm's gray and intact, normal light reflex; Nose: mucosa pink and moist, septum midline; Throat/Mouth: Teeth present, mucosa pink and moist, no exudate, lesions or ulcerations noted.  Neck: Normal range of motion. Neck supple, trachea midline. No massses, lumps or thyromegaly present.  Cardiovascular: Normal rate and rhythm. S1,S2 noted.  No murmur, rubs or gallops noted. No JVD or BLE edema. No carotid bruits noted. Pulmonary/Chest: Normal effort and scattered ronchi throughout. No respiratory distress. No wheezes, rales or ronchi noted.      Assessment & Plan:   Acute Bronchitis, new onset with additional workup required:  Continue taking your COPD meds eRx given for Levaquin Chest xray to r/o pneumonia  RTC as needed or if symptoms persist

## 2012-12-27 ENCOUNTER — Telehealth: Payer: Self-pay | Admitting: Cardiology

## 2012-12-27 ENCOUNTER — Telehealth: Payer: Self-pay | Admitting: Internal Medicine

## 2012-12-27 NOTE — Telephone Encounter (Signed)
Patient is taking Omeprazole and it is not helping. Has not been seen in 3 years. Moved OV to 12/29/12 at 10:30 AM with Doug Sou, PA.

## 2012-12-27 NOTE — Telephone Encounter (Signed)
error 

## 2012-12-29 ENCOUNTER — Encounter: Payer: Self-pay | Admitting: Gastroenterology

## 2012-12-29 ENCOUNTER — Ambulatory Visit (INDEPENDENT_AMBULATORY_CARE_PROVIDER_SITE_OTHER): Payer: Medicare Other | Admitting: Gastroenterology

## 2012-12-29 ENCOUNTER — Telehealth: Payer: Self-pay | Admitting: Internal Medicine

## 2012-12-29 VITALS — BP 130/84 | HR 86 | Ht 76.0 in | Wt 225.0 lb

## 2012-12-29 DIAGNOSIS — D126 Benign neoplasm of colon, unspecified: Secondary | ICD-10-CM

## 2012-12-29 DIAGNOSIS — K219 Gastro-esophageal reflux disease without esophagitis: Secondary | ICD-10-CM

## 2012-12-29 DIAGNOSIS — Z1211 Encounter for screening for malignant neoplasm of colon: Secondary | ICD-10-CM

## 2012-12-29 MED ORDER — PEG-KCL-NACL-NASULF-NA ASC-C 100 G PO SOLR: 1.0000 | Freq: Once | ORAL | Status: DC

## 2012-12-29 MED ORDER — BUDESONIDE-FORMOTEROL FUMARATE 160-4.5 MCG/ACT IN AERO: 2.0000 | INHALATION_SPRAY | Freq: Two times a day (BID) | RESPIRATORY_TRACT | Status: DC

## 2012-12-29 NOTE — Progress Notes (Signed)
Reviewed and agree, whom is he scheduled to have the colonoscopy with?

## 2012-12-29 NOTE — Patient Instructions (Addendum)
You have been given a separate informational sheet regarding your tobacco use, the importance of quitting and local resources to help you quit.  You have been scheduled for a colonoscopy with propofol. Please follow written instructions given to you at your visit today.  Please pick up your prep kit at the pharmacy within the next 1-3 days. If you use inhalers (even only as needed) or a CPAP machine, please bring them with you on the day of your procedure. We have given you samples of Prilosec to take.  Please increase to twice daily 20-30 minutes prior to your meal for 2 weeks and then you can resume once daily.   CC:  [name] MD

## 2012-12-29 NOTE — Progress Notes (Signed)
12/29/2012 Richard Hopkins [id number] Jan 20, 1946   History of Present Illness:  Patient is pleasant 67 year old black male who presents to our office today with complaints of increased acid reflux recently.  This has been occurring for the last 2-3 weeks with burning in his chest and upper abdomen.  He has been on omeprazole 20 mg once daily for a few years, which has worked well for him.  Says that he was taking his omeprazole at bedtime so he thinks that the flare of his symptoms may be from not taking it appropriately.  Also, with the holidays, he was not eating well and just lying/sitting around after eating.  Says that he actually does not have any symptoms today.  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  Denies any difficult or painful swallowing.  He has a history of tubular adenomas that were removed on previous colonoscopy in 08/2007 and it was recommended that he have a repeat colonoscopy in 5 years from that time, which he is overdue for.  He would like to schedule that procedure while he is here today.  He denies and change in bowels or blood in his stool.    Current Medications, Allergies, Past Medical History, Past Surgical History, Family History and Social History were reviewed in [employer] record.   Physical Exam: BP 130/84  Pulse 86  Ht 6\' 4"  (1.93 m)  Wt 225 lb (102.059 kg)  BMI 27.39 kg/m2 General: Well developed , black male in no acute distress Head: Normocephalic and atraumatic Eyes:  sclerae anicteric, conjunctiva pink  Ears: Normal auditory acuity Lungs: Clear throughout to auscultation, but decreased BS B/L. Heart: Regular rate and rhythm Abdomen: Soft, non tender and non distended. No masses, no hepatomegaly. Normal bowel sounds Rectal: Deferred.  Will be done at the time of colonoscopy. Musculoskeletal: Symmetrical  with no gross deformities  Neurological: Alert oriented x 4, grossly nonfocal Psychological:  Alert and cooperative. Normal mood and affect  Assessment and Recommendations: -GERD:  Patient was not taking his PPI (omeprazole) at ideal time.  Will begin taking it 30-60 minutes before eating.  We will increase it to BID for two weeks until symptoms are under better control then decrease back to once daily.  If symptoms persist then could consider changing PPI. -History of adenomatous colonic polyps.  Patient will be scheduled for a surveillance colonoscopy.  Risks and benefits were explained and patient agrees to proceed.

## 2012-12-29 NOTE — Telephone Encounter (Signed)
Pt aware that he needs to make and keep OV with MW. Pt states he has been having issues with money and GI; as soon as GI is finished then he would make appt. MW aware and states that its okay to give patient 1 sample ONLY.

## 2013-01-07 ENCOUNTER — Encounter: Payer: Self-pay | Admitting: Internal Medicine

## 2013-01-07 ENCOUNTER — Ambulatory Visit (AMBULATORY_SURGERY_CENTER): Payer: Medicare Other | Admitting: Internal Medicine

## 2013-01-07 VITALS — BP 148/82 | HR 58 | Temp 96.0°F | Resp 21 | Ht 76.0 in | Wt 225.0 lb

## 2013-01-07 DIAGNOSIS — D126 Benign neoplasm of colon, unspecified: Secondary | ICD-10-CM

## 2013-01-07 DIAGNOSIS — Z860100 Personal history of colon polyps, unspecified: Secondary | ICD-10-CM

## 2013-01-07 DIAGNOSIS — Z1211 Encounter for screening for malignant neoplasm of colon: Secondary | ICD-10-CM

## 2013-01-07 MED ORDER — DEXTROSE 5 % IV SOLN: INTRAVENOUS | Status: DC

## 2013-01-07 NOTE — Progress Notes (Signed)
Called to room to assist during endoscopic procedure.  Patient ID and intended procedure confirmed with present staff. Received instructions for my participation in the procedure from the performing physician.  

## 2013-01-07 NOTE — Progress Notes (Signed)
Cecal polyp site cauterized. Pt. Tolerated without difficulty.

## 2013-01-07 NOTE — Op Note (Signed)
[facility] [address] [place], [address]   COLONOSCOPY PROCEDURE REPORT  PATIENT: Richard, Hopkins  MR#: [id number] BIRTHDATE: 13-Jun-1946 , 66  yrs. old GENDER: Male ENDOSCOPIST: Hart Carwin, MD REFERRED BY: recall colonoscopy PROCEDURE DATE:  01/07/2013 PROCEDURE:   Colonoscopy with cold biopsy polypectomy and Colonoscopy with hot biopsy/bipolar ASA CLASS:   Class II INDICATIONS:Patient's personal history of adenomatous colon polyps and adenomatous polyp x2 2008. MEDICATIONS: MAC sedation, administered by CRNA and propofol (Diprivan) 200mg  IV  DESCRIPTION OF PROCEDURE:   After the risks benefits and alternatives of the procedure were thoroughly explained, informed consent was obtained.  A digital rectal exam revealed no abnormalities of the rectum.   The LB [id number]  endoscope was introduced through the anus and advanced to the cecum, which was identified by both the appendix and ileocecal valve. No adverse events experienced.   The quality of the prep was good, using MoviPrep  The instrument was then slowly withdrawn as the colon was fully examined.      COLON FINDINGS: Five sessile polyps ranging between 3-66mm in size were found in the ascending colon and at the cecum.  A polypectomy was performed with cold forceps and using hot forceps.  The resection was complete and the polyp tissue was completely retrieved.  Retroflexed views revealed no abnormalities. The time to cecum=8 minutes 21 seconds.  Withdrawal time=11 minutes 12 seconds.  The scope was withdrawn and the procedure completed. COMPLICATIONS: There were no complications.  ENDOSCOPIC IMPRESSION: Five sessile polyps ranging between 3-67mm in size were found in the ascending colon,( 90 cmx2, 100cmx2,  and at the cecum;) polypectomy was performed with cold forceps x4 and using hot forceps x1  RECOMMENDATIONS: Await pathology results   eSigned:  Hart Carwin, MD 01/07/2013  12:03 PM re Recall colonoscopy in 5 years cc:   PATIENT NAME:  Richard, Hopkins MR#: [id number]

## 2013-01-07 NOTE — Progress Notes (Signed)
1202 a/ox3 pleased report to [employer]

## 2013-01-07 NOTE — Progress Notes (Signed)
Patient did not experience any of the following events: a burn prior to discharge; a fall within the facility; wrong site/side/patient/procedure/implant event; or a hospital transfer or hospital admission upon discharge from the facility. (G8907) Patient did not have preoperative order for IV antibiotic SSI prophylaxis. (G8918)  

## 2013-01-07 NOTE — Patient Instructions (Addendum)
Findings:  Polyps Recommendations:  Repeat colonoscopy in 5 years  YOU HAD AN ENDOSCOPIC PROCEDURE TODAY AT THE [facility]: Refer to the procedure report that was given to you for any specific questions about what was found during the examination.  If the procedure report does not answer your questions, please call your gastroenterologist to clarify.  If you requested that your care partner not be given the details of your procedure findings, then the procedure report has been included in a sealed envelope for you to review at your convenience later.  YOU SHOULD EXPECT: Some feelings of bloating in the abdomen. Passage of more gas than usual.  Walking can help get rid of the air that was put into your GI tract during the procedure and reduce the bloating. If you had a lower endoscopy (such as a colonoscopy or flexible sigmoidoscopy) you may notice spotting of blood in your stool or on the toilet paper. If you underwent a bowel prep for your procedure, then you may not have a normal bowel movement for a few days.  DIET: Your first meal following the procedure should be a light meal and then it is ok to progress to your normal diet.  A half-sandwich or bowl of soup is an example of a good first meal.  Heavy or fried foods are harder to digest and may make you feel nauseous or bloated.  Likewise meals heavy in dairy and vegetables can cause extra gas to form and this can also increase the bloating.  Drink plenty of fluids but you should avoid alcoholic beverages for 24 hours.  ACTIVITY: Your care partner should take you home directly after the procedure.  You should plan to take it easy, moving slowly for the rest of the day.  You can resume normal activity the day after the procedure however you should NOT DRIVE or use heavy machinery for 24 hours (because of the sedation medicines used during the test).    SYMPTOMS TO REPORT IMMEDIATELY: A gastroenterologist can be reached at any hour.   During normal business hours, 8:30 AM to 5:00 PM Monday through Friday, call [phone].  After hours and on weekends, please call the GI answering service at [phone] who will take a message and have the physician on call contact you.   Following lower endoscopy (colonoscopy or flexible sigmoidoscopy):  Excessive amounts of blood in the stool  Significant tenderness or worsening of abdominal pains  Swelling of the abdomen that is new, acute  Fever of 100F or higher  Following upper endoscopy (EGD)  Vomiting of blood or coffee ground material  New chest pain or pain under the shoulder blades  Painful or persistently difficult swallowing  New shortness of breath  Fever of 100F or higher  Black, tarry-looking stools  FOLLOW UP: If any biopsies were taken you will be contacted by phone or by letter within the next 1-3 weeks.  Call your gastroenterologist if you have not heard about the biopsies in 3 weeks.  Our staff will call the home number listed on your records the next business day following your procedure to check on you and address any questions or concerns that you may have at that time regarding the information given to you following your procedure. This is a courtesy call and so if there is no answer at the home number and we have not heard from you through the emergency physician on call, we will assume that you have returned to your  regular daily activities without incident.  SIGNATURES/CONFIDENTIALITY: You and/or your care partner have signed paperwork which will be entered into your electronic medical record.  These signatures attest to the fact that that the information above on your After Visit Summary has been reviewed and is understood.  Full responsibility of the confidentiality of this discharge information lies with you and/or your care-partner.   Please follow all discharge instructions given to you by the recovery room nurse. If you have any questions or  problems after discharge please call one of the numbers listed above. You will receive a phone call in the am to see how you are doing and answer any questions you may have. Thank you for choosing [facility] for your health care needs.  

## 2013-01-10 ENCOUNTER — Telehealth: Payer: Self-pay

## 2013-01-10 NOTE — Telephone Encounter (Signed)
  Follow up Call-  Call back number 01/07/2013  Post procedure Call Back phone  # [phone]  Permission to leave phone message Yes     Patient questions:  Do you have a fever, pain , or abdominal swelling? no Pain Score  0 *  Have you tolerated food without any problems? yes  Have you been able to return to your normal activities? no  Do you have any questions about your discharge instructions: Diet   no Medications  no Follow up visit  no  Do you have questions or concerns about your Care? no  Actions: * If pain score is 4 or above: No action needed, pain <4.   Per the pt, "my acid reflux has been bothering me".  He said Dr. Juanda Chance discussed this with him in the recovery room.  I advised him if what she recommended did not help, to call a make an appointment to see Dr. Juanda Chance. Maw

## 2013-01-12 ENCOUNTER — Encounter: Payer: Self-pay | Admitting: Internal Medicine

## 2013-01-26 ENCOUNTER — Ambulatory Visit: Payer: Medicare Other | Admitting: Internal Medicine

## 2013-03-01 ENCOUNTER — Ambulatory Visit (INDEPENDENT_AMBULATORY_CARE_PROVIDER_SITE_OTHER): Payer: Medicare Other | Admitting: Internal Medicine

## 2013-03-01 ENCOUNTER — Ambulatory Visit (INDEPENDENT_AMBULATORY_CARE_PROVIDER_SITE_OTHER)
Admission: RE | Admit: 2013-03-01 | Discharge: 2013-03-01 | Disposition: A | Discharge status: Home / Self Care | Payer: Medicare Other | Source: Ambulatory Visit | Attending: Internal Medicine | Admitting: Internal Medicine

## 2013-03-01 ENCOUNTER — Encounter: Payer: Self-pay | Admitting: Internal Medicine

## 2013-03-01 ENCOUNTER — Other Ambulatory Visit: Payer: Self-pay | Admitting: Internal Medicine

## 2013-03-01 VITALS — BP 120/70 | HR 71 | Temp 98.0°F | Ht 76.0 in | Wt 218.0 lb

## 2013-03-01 DIAGNOSIS — J4489 Other specified chronic obstructive pulmonary disease: Secondary | ICD-10-CM

## 2013-03-01 DIAGNOSIS — R918 Other nonspecific abnormal finding of lung field: Secondary | ICD-10-CM

## 2013-03-01 DIAGNOSIS — IMO0002 Reserved for concepts with insufficient information to code with codable children: Secondary | ICD-10-CM

## 2013-03-01 DIAGNOSIS — Z7952 Long term (current) use of systemic steroids: Secondary | ICD-10-CM | POA: Insufficient documentation

## 2013-03-01 DIAGNOSIS — J449 Chronic obstructive pulmonary disease, unspecified: Secondary | ICD-10-CM

## 2013-03-01 DIAGNOSIS — J209 Acute bronchitis, unspecified: Secondary | ICD-10-CM

## 2013-03-01 DIAGNOSIS — E119 Type 2 diabetes mellitus without complications: Secondary | ICD-10-CM

## 2013-03-01 MED ORDER — HYDROCODONE-HOMATROPINE 5-1.5 MG/5ML PO SYRP: 5.0000 mL | ORAL_SOLUTION | Freq: Four times a day (QID) | ORAL | Status: DC | PRN

## 2013-03-01 MED ORDER — SULFAMETHOXAZOLE-TRIMETHOPRIM 800-160 MG PO TABS: 1.0000 | ORAL_TABLET | Freq: Two times a day (BID) | ORAL | Status: DC

## 2013-03-01 NOTE — Assessment & Plan Note (Signed)
stable overall by history and exam, recent data reviewed with pt, and pt to continue medical treatment as before,  to f/u any worsening symptoms or concerns Lab Results  Component Value Date   HGBA1C 6.5 09/16/2012

## 2013-03-01 NOTE — Assessment & Plan Note (Signed)
With few right side rales, for cxr, septra ds bid (as he needs a med on the [facility] $4 list) and cough med prn

## 2013-03-01 NOTE — Assessment & Plan Note (Signed)
Cont's on low dose prednisone 10 - 1/2 qd, cont inhalers as well

## 2013-03-01 NOTE — Patient Instructions (Addendum)
Please take all new medication as prescribed - the antibiotic, and cough medicine Please stop smoking Please go to the XRAY Department in the Basement (go straight as you get off the elevator) for the x-ray testing You will be contacted by phone if any changes need to be made immediately.  Otherwise, you will receive a letter about your results with an explanation Please remember to sign up for My Chart if you have not done so, as this will be important to you in the future with finding out test results, communicating by private email, and scheduling acute appointments online when needed. Please return in 1 months, or sooner if needed, with Lab testing done 3-5 days before

## 2013-03-01 NOTE — Progress Notes (Signed)
Subjective:    Patient ID: Richard Hopkins, male    DOB: Feb 12, 1946, 67 y.o.   MRN: [id number]  HPI  Here with acute onset mild to mod 2-3 days ST, HA, general weakness and malaise, with prod cough greenish sputum, but Pt denies chest pain, increased sob or doe, wheezing, orthopnea, PND, increased LE swelling, palpitations, dizziness or syncope.  Pt denies new neurological symptoms such as new headache, or facial or extremity weakness or numbness   Pt denies polydipsia, polyuria.   Pt denies wt loss, night sweats, loss of appetite, or other constitutional symptoms. Cont's to smoke, chantix too expensive so never tried. Past Medical History  Diagnosis Date  . THRUSH 04/18/2008  . 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 DIABETES MELLITUS, TYPE II 09/28/2007  . HYPERLIPIDEMIA 09/28/2007  . SMOKER 08/30/2010  . BLURRED VISION 10/15/2010  . HYPERTENSION 09/28/2007  . ALLERGIC RHINITIS 09/28/2007  . CHRONIC OBSTRUCTIVE PULMONARY DISEASE, ACUTE EXACERBATION 04/18/2008  . COPD 09/28/2007  . 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 ESOPHAGEAL STRICTURE 09/28/2007  . GERD 09/28/2007  . DIVERTICULAR DISEASE 08/31/2007  . BENIGN PROSTATIC HYPERTROPHY 10/09/2008  . Acute prostatitis 04/01/2010  . EPIDIDYMO-ORCHITIS 04/01/2010  . SKIN LESION 01/19/2008  . LUMBAR DISC DISORDER 09/28/2007  . Pain in soft tissues of limb 01/29/2009  . MUSCLE CRAMPS 10/15/2010  . FATIGUE 09/28/2007  . MASS, SUPERFICIAL 11/06/2008  . DYSPNEA 08/20/2010  . Wheezing 02/14/2010  . DYSPNEA/SHORTNESS OF BREATH 09/28/2007  . Nocturia 10/09/2008  . PSA, INCREASED 06/26/2010  . ABNORMAL ELECTROCARDIOGRAM 08/24/2009  . 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 SYNCOPE 01/16/2011  . Brain tumor 09/21/2011  . COPD (chronic obstructive pulmonary disease)   . 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 Back surgury      x 4 disabled after 2003    reports that he has been smoking Cigarettes.  He has been  smoking about 1.00 pack per day. He has never used smokeless tobacco. He reports that  drinks alcohol. He reports that he does not use illicit drugs. family history includes Diabetes in his mother and Hypertension in his other. Allergies  Allergen Reactions  . Ace Inhibitors   . Cardura (Doxazosin Mesylate)     dizzy  . Hydrocodone Other (See Comments)    nauasea and dizziness  . Lisinopril     REACTION: cough  . Alfuzosin Rash   Current Outpatient Prescriptions on File Prior to Visit  Medication Sig Dispense Refill  . aspirin 81 MG EC tablet Take 81 mg by mouth daily.        . budesonide-formoterol (SYMBICORT) 160-4.5 MCG/ACT inhaler Inhale 2 puffs into the lungs 2 (two) times daily.  1 Inhaler  0  . cloNIDine (CATAPRES) 0.1 MG tablet TAKE ONE TABLET BY MOUTH TWICE DAILY  180 tablet  2  . clotrimazole-betamethasone (LOTRISONE) cream Use as directed twice per day as needed  15 g  1  . Fluticasone-Salmeterol (ADVAIR DISKUS) 250-50 MCG/DOSE AEPB Inhale 1 puff into the lungs 2 (two) times daily.  1 each  11  . ipratropium-albuterol (DUONEB) 0.5-2.5 (3) MG/3ML SOLN Take 3 mLs by nebulization every 6 (six) hours as needed.  360 mL  5  . lovastatin (MEVACOR) 20 MG tablet TAKE TWO TABLETS BY MOUTH EVERY DAY IN THE EVENING  180 tablet  3  . metFORMIN (GLUCOPHAGE) 500 MG tablet Take 1 tablet (500 mg total) by mouth 2 (two) times daily with a meal.  180 tablet  3  . Omeprazole 20 MG TBEC Take 1 tablet (20 mg total) by mouth daily.  90 each  3  . predniSONE (DELTASONE) 10 MG tablet Take 1 tablet (10 mg total) by mouth daily.  90 tablet  3  . traMADol (ULTRAM) 50 MG tablet Take 1 tablet (50 mg total) by mouth every 6 (six) hours as needed for pain.  60 tablet  1  . vardenafil (LEVITRA) 20 MG tablet Take 1 tablet (20 mg total) by mouth daily as needed.  10 tablet  5   No current facility-administered medications on file prior to visit.   Review of Systems  Constitutional: Negative for unexpected  weight change, or unusual diaphoresis  HENT: Negative for tinnitus.   Eyes: Negative for photophobia and visual disturbance.  Respiratory: Negative for choking and stridor.   Gastrointestinal: Negative for vomiting and blood in stool.  Genitourinary: Negative for hematuria and decreased urine volume.  Musculoskeletal: Negative for acute joint swelling Skin: Negative for color change and wound.  Neurological: Negative for tremors and numbness other than noted  Psychiatric/Behavioral: Negative for decreased concentration or  hyperactivity.       Objective:   Physical Exam BP 120/70  Pulse 71  Temp(Src) 98 F (36.7 C) (Oral)  Ht 6\' 4"  (1.93 m)  Wt 218 lb (98.884 kg)  BMI 26.55 kg/m2  SpO2 94% VS noted,  Constitutional: Pt appears well-developed and well-nourished.  HENT: Head: NCAT.  Right Ear: External ear normal.  Left Ear: External ear normal.  Eyes: Conjunctivae and EOM are normal. Pupils are equal, round, and reactive to light.  Neck: Normal range of motion. Neck supple.  Cardiovascular: Normal rate and regular rhythm.   Pulmonary/Chest: Effort normal and breath sounds with ? Increased right lower rales over baseline Neurological: Pt is alert. Not confused  Skin: Skin is warm. No erythema.  Psychiatric: Pt behavior is normal. Thought content normal.     Assessment & Plan:

## 2013-03-01 NOTE — Assessment & Plan Note (Signed)
O/w stable,no exacerbation today

## 2013-03-04 ENCOUNTER — Other Ambulatory Visit: Payer: Self-pay | Admitting: Internal Medicine

## 2013-03-04 ENCOUNTER — Ambulatory Visit (INDEPENDENT_AMBULATORY_CARE_PROVIDER_SITE_OTHER)
Admission: RE | Admit: 2013-03-04 | Discharge: 2013-03-04 | Disposition: A | Discharge status: Home / Self Care | Payer: Medicare Other | Source: Ambulatory Visit | Attending: Internal Medicine | Admitting: Internal Medicine

## 2013-03-04 ENCOUNTER — Encounter: Payer: Self-pay | Admitting: Internal Medicine

## 2013-03-04 DIAGNOSIS — R918 Other nonspecific abnormal finding of lung field: Secondary | ICD-10-CM

## 2013-03-04 DIAGNOSIS — K228 Other specified diseases of esophagus: Secondary | ICD-10-CM

## 2013-03-04 DIAGNOSIS — I2510 Atherosclerotic heart disease of native coronary artery without angina pectoris: Secondary | ICD-10-CM | POA: Insufficient documentation

## 2013-03-04 DIAGNOSIS — R222 Localized swelling, mass and lump, trunk: Secondary | ICD-10-CM

## 2013-03-07 ENCOUNTER — Encounter: Payer: Self-pay | Admitting: Internal Medicine

## 2013-03-07 ENCOUNTER — Ambulatory Visit (INDEPENDENT_AMBULATORY_CARE_PROVIDER_SITE_OTHER): Payer: Medicare Other | Admitting: Internal Medicine

## 2013-03-07 VITALS — BP 136/80 | HR 85 | Temp 97.3°F | Ht 76.0 in | Wt 216.4 lb

## 2013-03-07 DIAGNOSIS — J441 Chronic obstructive pulmonary disease with (acute) exacerbation: Secondary | ICD-10-CM

## 2013-03-07 DIAGNOSIS — J8410 Pulmonary fibrosis, unspecified: Secondary | ICD-10-CM

## 2013-03-07 DIAGNOSIS — F17200 Nicotine dependence, unspecified, uncomplicated: Secondary | ICD-10-CM

## 2013-03-07 NOTE — Progress Notes (Signed)
Subjective:    Patient ID: Richard Hopkins, male    DOB: [birth year]  MRN: [id number]  HPI  76 wobm still smoking with gradually worse doe x since early 2000's   03/15/2008 Pulmonary eval, rec stop smoking, return if more sob or cough   August 30, 2010 Pulmonary consult for COPD...pt c/o fatigue for over 6 months...imp cxr from 08/20/10 reviewed c/w  mild PF but the number one concern in smokers is RBILD vs DIP, both directly linked to smoking and no w/u needs to be considered at this point. DLCO is normal. If there is convincing progression of dyspnea or desaturation with exercise first step is stop smoking then consider HRCT if not improving and pulmonary f/u prn  03/07/13 pulmonary consultation /Richard Hopkins requested by Dr Excell Seltzer re abd CT with MPN cc minimal increase doe x 2 years, really not limiting activity assoc with dry cough.  No obvious daytime variabilty or assoc excess mucus production or cp or chest tightness, subjective wheeze overt sinus or hb symptoms. No unusual exp hx or h/o childhood pna/ asthma or premature birth to his knowledge.   Sleeping ok without nocturnal  or early am exacerbation  of respiratory  c/o's or need for noct saba. Also denies any obvious fluctuation of symptoms with weather or environmental changes or other aggravating or alleviating factors except as outlined above    Allergies (verified):  1) ! Lisinopril (Lisinopril)  2) ! Ace Inhibitors     Past Medical History:  COPD............................................Marland KitchenWert  - 2009 FEV1 baseline of 1.8 > course of prednisone with an FEV1 up to 2.45 with a ratio of 57%  - ? 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  Allergic rhinitis  Benign prostatic hypertrophy         Review of Systems  Constitutional: Positive for  appetite change. Negative for fever, chills, activity change and unexpected weight change.  HENT: Negative for congestion, sore throat, rhinorrhea, sneezing, trouble swallowing, dental problem, voice change and postnasal drip.   Eyes: Negative for visual disturbance.  Respiratory: Positive for cough. Negative for choking and shortness of breath.   Cardiovascular: Negative for chest pain and leg swelling.  Gastrointestinal: Negative for nausea, vomiting and abdominal pain.  Genitourinary: Negative for difficulty urinating.  Musculoskeletal: Negative for arthralgias.  Skin: Negative for rash.  Psychiatric/Behavioral: Negative for behavioral problems and confusion.       Objective:   Physical Exam   wt 223 August 30, 2010 > 224 October 12, 2010 > 216 03/07/2013   .HEENT mild turbinate edema. Dentures full Oropharynx no thrush or excess pnd or cobblestoning. No JVD or cervical adenopathy. Mild accessory muscle hypertrophy. Trachea midline, nl thryroid. Chest was hyperinflated by percussion with diminished breath sounds and moderate increased exp time with MID expiratory bilateral wheeze. Hoover sign positive at mid inspiration. Regular rate and rhythm without murmur gallop or rub or increase P2. Abd: no hsm, nl excursion. Ext warm without cyanosis clubbing or edema.   cxr 03/01/13  No evidence of pulmonary mass.  2. However, there are multiple nonspecific pulmonary nodules in  the lungs bilaterally, as above. The largest of these measures 1  cm in the right lower lobe (image 38 of series 3). Many of these  nodules are slightly cavitary  with thick walls. The overall  appearance is suggestive of potential smoking related disease such  as pulmonary Langerhans cell histiocytosis. Clinical correlation  is recommended with a strong consideration for smoking cessation,  as this can be rapidly progressive and destructive lung disease if  smoking cessation is not implemented. Additionally, the   possibility of malignancy is not excluded, and follow up evaluation  with repeat chest CT in 3-6 months is recommended to evaluate for  the stability of these findings.   3. 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 Attention on follow-up studies is recommended to assess for  temporal progression of disease.  4. Atherosclerosis, including left main and left circumflex  coronary artery disease. Assessment for potential risk factor  modification, dietary therapy or pharmacologic therapy may be  warranted, if clinically indicated.  5. Severe circumferential thickening of the distal esophagus.  This may simply reflect chronic changes related to reflux  esophagitis,      Assessment & Plan:

## 2013-03-07 NOTE — Patient Instructions (Addendum)
The key is to stop smoking completely before smoking completely stops you - I don't recommend additional CT scans at this point  Work on inhaler technique:  relax and gently blow all the way out then take a nice smooth deep breath back in, triggering the inhaler at same time you start breathing in.  Hold for up to 5 seconds if you can.  Rinse and gargle with water when done   If your mouth or throat starts to bother you,   I suggest you time the inhaler to your dental care and after using the inhaler(s) brush teeth and tongue with a baking soda containing toothpaste and when you rinse this out, gargle with it first to see if this helps your mouth and throat.     Please schedule a follow up office visit in 6 weeks, sooner if needed with pft's

## 2013-03-08 ENCOUNTER — Encounter: Payer: Self-pay | Admitting: *Deleted

## 2013-03-08 NOTE — Assessment & Plan Note (Addendum)
DDx for pulmonary fibrosis  includes idiopathic pulmonary fibrosis, pulmonary fibrosis associated with rheumatologic diseases (which have a relatively benign course in most cases) , adverse effect from  drugs such as chemotherapy or amiodarone exposure, nonspecific interstitial pneumonia which is typically steroid responsive, and chronic hypersensitivity pneumonitis.   In active  smokers Langerhan's Cell  Histiocyctosis (eosinophilic granuomatosis),  DIP,  and Respiratory Bronchiolitis ILD also need to be considered,    The assoc with nodules in a smoker is classic for [facility] and the only treatment is stop smoking, discussed separately  Needs pft's on return and serial cxr f/u - CT scan's not needed in this setting

## 2013-03-08 NOTE — Assessment & Plan Note (Signed)
>   3 min discussion  I emphasized that although we never turn away smokers from the pulmonary clinic, we do ask that they understand that the recommendations that we make  won't work nearly as well in the presence of continued cigarette exposure.  In fact, we may very well  reach a point where we can't promise to help the patient if he/she can't quit smoking. (We can and will promise to try to help, we just can't promise what we recommend will really work)   In the case of Langherhan's cell Histiocytosis, the only effective treatment is stop smoking.

## 2013-03-09 ENCOUNTER — Ambulatory Visit (INDEPENDENT_AMBULATORY_CARE_PROVIDER_SITE_OTHER): Payer: Medicare Other | Admitting: Internal Medicine

## 2013-03-09 ENCOUNTER — Encounter: Payer: Self-pay | Admitting: Internal Medicine

## 2013-03-09 VITALS — BP 120/86 | HR 72 | Ht 76.0 in | Wt 217.0 lb

## 2013-03-09 DIAGNOSIS — K219 Gastro-esophageal reflux disease without esophagitis: Secondary | ICD-10-CM

## 2013-03-09 DIAGNOSIS — R933 Abnormal findings on diagnostic imaging of other parts of digestive tract: Secondary | ICD-10-CM

## 2013-03-09 NOTE — Progress Notes (Signed)
PRATHER FAILLA [birth year] MRN [id number]  History of Present Illness:  This is a 67 year old African American male who is here to discuss an upper endoscopy. He had an abnormal CT scan of the chest last week which showed multiple pulmonary nodules, bilateral perinephric stranding, L1 compression fracture and also thickening of the distal esophagus. There was no definite mass. He has a history of gastroesophageal reflux. An upper endoscopy in November 2010 showed gastritis and duodenitis with metaplasia. He was recently started on omeprazole 20 mg twice a day with complete relief of his upper GI symptoms. He has decreased his omeprazole to one a day without flareup of his reflux. He denies nocturnal cough or hoarseness. He denies chest pain. He has a history of esophageal stricture which was dilated on an upper endoscopy in 1997. He currently denies any symptoms of food going down. He is up-to-date on his colonoscopy, having an exam in January 2014 which showed tubular adenomas x 2.   Past Medical History  Diagnosis Date  . THRUSH   . ADENOMATOUS COLONIC POLYP   . DIABETES MELLITUS, TYPE II   . HYPERLIPIDEMIA   . SMOKER   . BLURRED VISION   . HYPERTENSION   . ALLERGIC RHINITIS   . CHRONIC OBSTRUCTIVE PULMONARY DISEASE, ACUTE EXACERBATION   . COPD   . PULMONARY FIBROSIS   . ESOPHAGEAL STRICTURE   . GERD   . DIVERTICULAR DISEASE   . BENIGN PROSTATIC HYPERTROPHY   . Acute prostatitis   . EPIDIDYMO-ORCHITIS   . SKIN LESION   . LUMBAR DISC DISORDER   . Pain in soft tissues of limb   . MUSCLE CRAMPS   . FATIGUE   . MASS, SUPERFICIAL   . DYSPNEA   . Wheezing   . DYSPNEA/SHORTNESS OF BREATH   . Nocturia   . PSA, INCREASED   . ABNORMAL ELECTROCARDIOGRAM   . ERECTILE DYSFUNCTION, ORGANIC, HX OF   . SYNCOPE   . Brain tumor   . COPD (chronic obstructive pulmonary disease)   . Chronic steroid use    Past Surgical History  Procedure Laterality Date  . Back surgery      x 4 disabled  after 2003  . Elbow arthroscopy      reports that he quit smoking 2 days ago. His smoking use included Cigarettes. He has a 50 pack-year smoking history. He has never used smokeless tobacco. He reports that  drinks alcohol. He reports that he does not use illicit drugs. family history includes Asthma in his mother; Diabetes in his mother; and Emphysema in his mother.  There is no history of Colon cancer. Allergies  Allergen Reactions  . Ace Inhibitors   . Cardura (Doxazosin Mesylate)     dizzy  . Hydrocodone Other (See Comments)    nauasea and dizziness  . Lisinopril     REACTION: cough  . Alfuzosin Rash        Review of Systems: Currently denies any symptoms of reflux dysphagia or chest pain  The remainder of the 10 point ROS is negative except as outlined in H&P   Physical Exam: General appearance  Well developed, in no distress. Psychological normal mood and affect.  Assessment and Plan:  Problem #47 67 year old [place] American male with abnormal CT scan of the chest which raises the question of thickening in the distal esophagus. Patient has no symptoms of esophageal mass or stricture. He has a history of esophageal stricture in 1997. His reflux symptoms are currently well-controlled with  omeprazole 20 mg daily. Because of the question of thickening which could also be caused by hiatal hernia, esophagitis or esophageal cancer, we will proceed with an upper endoscopy and appropriate biopsies. Patient agrees with the plans. He will continue omeprazole 20 mg daily and antireflux measures.   03/09/2013 Lina Sar

## 2013-03-09 NOTE — Patient Instructions (Addendum)
You have been scheduled for an endoscopy with propofol. Please follow written instructions given to you at your visit today. If you use inhalers (even only as needed), please bring them with you on the day of your procedure.  CC: Dr [name]

## 2013-03-14 ENCOUNTER — Telehealth: Payer: Self-pay | Admitting: Internal Medicine

## 2013-03-14 NOTE — Telephone Encounter (Signed)
Spoke with patient and he will come for his procedure as recommended by Dr. Juanda Chance.

## 2013-03-15 ENCOUNTER — Ambulatory Visit (AMBULATORY_SURGERY_CENTER): Payer: Medicare Other | Admitting: Internal Medicine

## 2013-03-15 ENCOUNTER — Encounter: Payer: Self-pay | Admitting: Internal Medicine

## 2013-03-15 ENCOUNTER — Other Ambulatory Visit: Payer: Self-pay | Admitting: Internal Medicine

## 2013-03-15 VITALS — BP 141/90 | HR 67 | Temp 96.1°F | Resp 18 | Ht 76.0 in | Wt 225.0 lb

## 2013-03-15 DIAGNOSIS — K209 Esophagitis, unspecified: Secondary | ICD-10-CM

## 2013-03-15 DIAGNOSIS — K219 Gastro-esophageal reflux disease without esophagitis: Secondary | ICD-10-CM

## 2013-03-15 DIAGNOSIS — K228 Other specified diseases of esophagus: Secondary | ICD-10-CM

## 2013-03-15 DIAGNOSIS — R933 Abnormal findings on diagnostic imaging of other parts of digestive tract: Secondary | ICD-10-CM

## 2013-03-15 LAB — GLUCOSE, CAPILLARY
Glucose-Capillary: 80 mg/dL (ref 70–99)
Glucose-Capillary: 81 mg/dL (ref 70–99)
Glucose-Capillary: 145 mg/dL — ABNORMAL HIGH (ref 70–99)

## 2013-03-15 MED ORDER — SODIUM CHLORIDE 0.9 % IV SOLN: 500.0000 mL | INTRAVENOUS | Status: DC

## 2013-03-15 NOTE — Progress Notes (Signed)
Patient did not experience any of the following events: a burn prior to discharge; a fall within the facility; wrong site/side/patient/procedure/implant event; or a hospital transfer or hospital admission upon discharge from the facility. (G8907) Patient did not have preoperative order for IV antibiotic SSI prophylaxis. (G8918)  

## 2013-03-15 NOTE — Op Note (Signed)
[facility] [address] [place], [address]   ENDOSCOPY PROCEDURE REPORT  PATIENT: Richard Hopkins, Richard Hopkins  MR#: [id number] BIRTHDATE: February 03, 1946 , 67  yrs. old GENDER: Male ENDOSCOPIST: Hart Carwin, MD REFERRED BY:  [name], M.D. PROCEDURE DATE:  03/15/2013 PROCEDURE:  EGD w/ biopsy and Savary dilation of esophagus ASA CLASS:     Class II INDICATIONS:  abnormal CT scan suggesting thickened distal esophagus, pt has a hx of esophageal stricture, dilated in 1997, last EGS in 2010  metaplasia MEDICATIONS: MAC sedation, administered by CRNA and propofol (Diprivan) 150mg  IV TOPICAL ANESTHETIC: Cetacaine Spray  DESCRIPTION OF PROCEDURE: After the risks benefits and alternatives of the procedure were thoroughly explained, informed consent was obtained.  The LB GIF-H180 [id number] endoscope was introduced through the mouth and advanced to the second portion of the duodenum. Without limitations.  The instrument was slowly withdrawn as the mucosa was fully examined.      esophagus; esophageal mucosa appeared normal in proximity mid and distal esophagus. GE junction appeared normal. There was no evidence of stricture. Endoscope traversed into the stomach without resistance. There was no spasm. There was no retained food in the esophagus. Multiple biopsies were taken from GE junction to rule out metaplasia m; there was no evidence of hiatal hernia. The gastric folds appeared normal along the greater curvature of the stomach. Gastric antrum and pyloric outlet were normal. Retroflexion of the endoscope revealed normal fundus and cardia. Duodenum: Duodenal bulb and descending  duodenum were normal[  .16 mm Savary dilator passed easily through the esophagus without resistance. There was no blood on the dilator        The scope was then withdrawn from the patient and the procedure completed.  COMPLICATIONS: There were no complications. ENDOSCOPIC IMPRESSION:  abnormal  CT scan of the chest Suggestive of abnormal distal esophagus, there nis  no endoscopic correlation History of esophageal stricture. Passage of 16 mm Savary dilator Status post biopsies from a normal-appearing GE junction 2 rule out metaplasia which was found on previous endoscopy in 2010  RECOMMENDATIONS: 1.  Await pathology results 2.  Anti-reflux regimen to be follow 3.  Continue PPI  REPEAT EXAM: no recall  eSigned:  Hart Carwin, MD 03/15/2013 4:20 PM   CC:  PATIENT NAME:  [name], [name] MR#: [id number]

## 2013-03-15 NOTE — Patient Instructions (Addendum)
YOU HAD AN ENDOSCOPIC PROCEDURE TODAY AT THE [facility]: Refer to the procedure report that was given to you for any specific questions about what was found during the examination.  If the procedure report does not answer your questions, please call your gastroenterologist to clarify.  If you requested that your care partner not be given the details of your procedure findings, then the procedure report has been included in a sealed envelope for you to review at your convenience later.  YOU SHOULD EXPECT: Some feelings of bloating in the abdomen. Passage of more gas than usual.  Walking can help get rid of the air that was put into your GI tract during the procedure and reduce the bloating. If you had a lower endoscopy (such as a colonoscopy or flexible sigmoidoscopy) you may notice spotting of blood in your stool or on the toilet paper. If you underwent a bowel prep for your procedure, then you may not have a normal bowel movement for a few days.  DIET: Your first meal following the procedure should be a light meal and then it is ok to progress to your normal diet.  A half-sandwich or bowl of soup is an example of a good first meal.  Heavy or fried foods are harder to digest and may make you feel nauseous or bloated.  Likewise meals heavy in dairy and vegetables can cause extra gas to form and this can also increase the bloating.  Drink plenty of fluids but you should avoid alcoholic beverages for 24 hours.  ACTIVITY: Your care partner should take you home directly after the procedure.  You should plan to take it easy, moving slowly for the rest of the day.  You can resume normal activity the day after the procedure however you should NOT DRIVE or use heavy machinery for 24 hours (because of the sedation medicines used during the test).    SYMPTOMS TO REPORT IMMEDIATELY: A gastroenterologist can be reached at any hour.  During normal business hours, 8:30 AM to 5:00 PM Monday through Friday,  call [phone].  After hours and on weekends, please call the GI answering service at [phone] who will take a message and have the physician on call contact you.    Following upper endoscopy (EGD)  Vomiting of blood or coffee ground material  New chest pain or pain under the shoulder blades  Painful or persistently difficult swallowing  New shortness of breath  Fever of 100F or higher  Black, tarry-looking stools  FOLLOW UP: If any biopsies were taken you will be contacted by phone or by letter within the next 1-3 weeks.  Call your gastroenterologist if you have not heard about the biopsies in 3 weeks.  Our staff will call the home number listed on your records the next business day following your procedure to check on you and address any questions or concerns that you may have at that time regarding the information given to you following your procedure. This is a courtesy call and so if there is no answer at the home number and we have not heard from you through the emergency physician on call, we will assume that you have returned to your regular daily activities without incident.  SIGNATURES/CONFIDENTIALITY: You and/or your care partner have signed paperwork which will be entered into your electronic medical record.  These signatures attest to the fact that that the information above on your After Visit Summary has been reviewed and is understood.  Full   responsibility of the confidentiality of this discharge information lies with you and/or your care-partner.  Post dilation diet given with instructions.

## 2013-03-15 NOTE — Progress Notes (Signed)
Pt stated he felt like he was going to pass out when iv started. Pt put on heart monitor, bp taken 106/63.Heart rate 52. Pt given iv bolus, head lowered flat. Blood pressure rechecked 108/75, hr 73 states feels better. bp recheck 10 minutes later. bp 105/84, hr 76. Pt states he feels fine. Hob raised 20 degrees. Blood sugar taken with reading of 81.

## 2013-03-16 ENCOUNTER — Telehealth: Payer: Self-pay

## 2013-03-16 NOTE — Telephone Encounter (Signed)
  Follow up Call-  Call back number 03/15/2013 01/07/2013  Post procedure Call Back phone  # [phone]  Permission to leave phone message Yes Yes     Patient questions:  Do you have a fever, pain , or abdominal swelling? no Pain Score  0 *  Have you tolerated food without any problems? yes  Have you been able to return to your normal activities? yes  Do you have any questions about your discharge instructions: Diet   no Medications  no Follow up visit  no  Do you have questions or concerns about your Care? no  Actions: * If pain score is 4 or above: No action needed, pain <4.

## 2013-03-17 ENCOUNTER — Ambulatory Visit: Payer: Medicare Other | Admitting: Internal Medicine

## 2013-03-18 ENCOUNTER — Encounter: Payer: Self-pay | Admitting: Cardiovascular Disease

## 2013-03-18 ENCOUNTER — Ambulatory Visit (INDEPENDENT_AMBULATORY_CARE_PROVIDER_SITE_OTHER): Payer: Medicare Other | Admitting: Cardiovascular Disease

## 2013-03-18 VITALS — BP 167/103 | HR 81 | Ht 76.0 in | Wt 214.8 lb

## 2013-03-18 DIAGNOSIS — E785 Hyperlipidemia, unspecified: Secondary | ICD-10-CM

## 2013-03-18 DIAGNOSIS — I10 Essential (primary) hypertension: Secondary | ICD-10-CM

## 2013-03-18 DIAGNOSIS — F17200 Nicotine dependence, unspecified, uncomplicated: Secondary | ICD-10-CM

## 2013-03-18 DIAGNOSIS — E119 Type 2 diabetes mellitus without complications: Secondary | ICD-10-CM

## 2013-03-18 DIAGNOSIS — I2510 Atherosclerotic heart disease of native coronary artery without angina pectoris: Secondary | ICD-10-CM

## 2013-03-18 DIAGNOSIS — I25810 Atherosclerosis of coronary artery bypass graft(s) without angina pectoris: Secondary | ICD-10-CM

## 2013-03-18 NOTE — Assessment & Plan Note (Signed)
Major issue given abnormalities on CT  F/U Dr Sherene Sires Continue inhaler Continue attempts at quitting

## 2013-03-18 NOTE — Assessment & Plan Note (Signed)
Cholesterol is at goal.  Continue current dose of statin and diet Rx.  No myalgias or side effects.  F/U  LFT's in 6 months. Lab Results  Component Value Date   LDLCALC 71 09/16/2012

## 2013-03-18 NOTE — Progress Notes (Signed)
Patient ID: Richard Hopkins, male   DOB: June 11, 1946, 67 y.o.   MRN: [id number] 67 yo referred by CAD on CT.  No history of CAD.  Smoker.  Chest CT with multiple nodules. Seen by Dr Sherene Sires.  Discussed smoking cessation and added symbicort. Apparently does not think he has CA.  Improved breathing with inhaler and down to 1/2 ppd using E-cig.  Reviewed CT scan and he actually has lilttle calcium in his cors for age and sex matched males who smoke. One foci is in LM but not likely obstructive. Also has calcification of aortic arch and descending thoracic aorta.  Walks daily with no chest pain. Dyspnea from smoking.  No palpitations or syncope.  Last LDL was good at 71 Type 2 DM    ROS: Denies fever, malais, weight loss, blurry vision, decreased visual acuity, cough, sputum, SOB, hemoptysis, pleuritic pain, palpitaitons, heartburn, abdominal pain, melena, lower extremity edema, claudication, or rash.  All other systems reviewed and negative   General: Affect appropriate Healthy:  appears stated age HEENT: normal Neck supple with no adenopathy JVP normal no bruits no thyromegaly Lungs clear with no wheezing and good diaphragmatic motion Heart:  S1/S2 no murmur,rub, gallop or click PMI normal Abdomen: benighn, BS positve, no tenderness, no AAA no bruit.  No HSM or HJR Distal pulses intact with no bruits No edema Neuro non-focal Skin warm and dry No muscular weakness  Medications Current Outpatient Prescriptions  Medication Sig Dispense Refill  . aspirin 81 MG EC tablet Take 81 mg by mouth daily.        . budesonide-formoterol (SYMBICORT) 160-4.5 MCG/ACT inhaler Inhale 2 puffs into the lungs 2 (two) times daily.  1 Inhaler  0  . cloNIDine (CATAPRES) 0.1 MG tablet TAKE ONE TABLET BY MOUTH TWICE DAILY  180 tablet  2  . clotrimazole-betamethasone (LOTRISONE) cream Use as directed twice per day as needed  15 g  1  . Fluticasone-Salmeterol (ADVAIR DISKUS) 250-50 MCG/DOSE AEPB Inhale 1 puff into the  lungs 2 (two) times daily.  1 each  11  . HYDROcodone-homatropine (HYCODAN) 5-1.5 MG/5ML syrup Take 5 mLs by mouth every 6 (six) hours as needed for cough.  120 mL  0  . ipratropium-albuterol (DUONEB) 0.5-2.5 (3) MG/3ML SOLN Take 3 mLs by nebulization every 6 (six) hours as needed.  360 mL  5  . lovastatin (MEVACOR) 20 MG tablet TAKE TWO TABLETS BY MOUTH EVERY DAY IN THE EVENING  180 tablet  3  . metFORMIN (GLUCOPHAGE) 500 MG tablet Take 1 tablet (500 mg total) by mouth 2 (two) times daily with a meal.  180 tablet  3  . Omeprazole 20 MG TBEC Take 1 tablet (20 mg total) by mouth daily.  90 each  3  . predniSONE (DELTASONE) 10 MG tablet Take 1 tablet (10 mg total) by mouth daily.  90 tablet  3  . traMADol (ULTRAM) 50 MG tablet Take 1 tablet (50 mg total) by mouth every 6 (six) hours as needed for pain.  60 tablet  1  . vardenafil (LEVITRA) 20 MG tablet Take 1 tablet (20 mg total) by mouth daily as needed.  10 tablet  5   No current facility-administered medications for this visit.    Allergies Ace inhibitors; Cardura; Hydrocodone; Lisinopril; and Alfuzosin  Family History: Family History  Problem Relation Age of Onset  . Diabetes Mother   . Colon cancer Neg Hx   . Asthma Mother   . Emphysema Mother  smoker    Social History: History   Social History  . Marital Status: Married    Spouse Name: N/A    Number of Children: 5  . Years of Education: N/A   Occupational History  . disabled    Social History Main Topics  . Smoking status: Current Some Day Smoker -- 1.00 packs/day for 50 years    Types: Cigarettes    Last Attempt to Quit: 03/07/2013  . Smokeless tobacco: Never Used  . Alcohol Use: Yes  . Drug Use: No  . Sexually Active: Not on file   Other Topics Concern  . Not on file   Social History Narrative   Patient does not get regular exercise   Daily caffeine use    Electrocardiogram:  Today NSR normal ECG  Assessment and Plan

## 2013-03-18 NOTE — Assessment & Plan Note (Signed)
Some calcium in LM on chest CT. Not likely obstructive in any way No symptoms and normal ECG.  Continue asa and statin F/U ETT

## 2013-03-18 NOTE — Assessment & Plan Note (Signed)
Well controlled.  Continue current medications and low sodium Dash type diet.    

## 2013-03-18 NOTE — Patient Instructions (Addendum)

## 2013-03-21 ENCOUNTER — Encounter: Payer: Self-pay | Admitting: Internal Medicine

## 2013-03-24 ENCOUNTER — Ambulatory Visit (INDEPENDENT_AMBULATORY_CARE_PROVIDER_SITE_OTHER): Payer: Medicare Other | Admitting: Internal Medicine

## 2013-03-24 ENCOUNTER — Encounter: Payer: Self-pay | Admitting: Internal Medicine

## 2013-03-24 ENCOUNTER — Other Ambulatory Visit (INDEPENDENT_AMBULATORY_CARE_PROVIDER_SITE_OTHER): Payer: Medicare Other

## 2013-03-24 VITALS — BP 122/86 | HR 71 | Temp 98.3°F | Ht 76.0 in | Wt 217.2 lb

## 2013-03-24 DIAGNOSIS — E119 Type 2 diabetes mellitus without complications: Secondary | ICD-10-CM

## 2013-03-24 DIAGNOSIS — Z87448 Personal history of other diseases of urinary system: Secondary | ICD-10-CM

## 2013-03-24 DIAGNOSIS — Z0000 Encounter for general adult medical examination without abnormal findings: Secondary | ICD-10-CM

## 2013-03-24 DIAGNOSIS — N400 Enlarged prostate without lower urinary tract symptoms: Secondary | ICD-10-CM

## 2013-03-24 DIAGNOSIS — F17200 Nicotine dependence, unspecified, uncomplicated: Secondary | ICD-10-CM

## 2013-03-24 LAB — LIPID PANEL
HDL: 60.4 mg/dL (ref >39.00)
VLDL: 11.8 mg/dL (ref 0.0–40.0)

## 2013-03-24 LAB — CBC WITH DIFFERENTIAL/PLATELET
Basophils Relative: 0.5 % (ref 0.0–3.0)
Eosinophils Relative: 5.6 % — ABNORMAL HIGH (ref 0.0–5.0)
HCT: 44.1 % (ref 39.0–52.0)
Hemoglobin: 14.2 g/dL (ref 13.0–17.0)
Lymphs Abs: 1.1 10*3/uL (ref 0.7–4.0)
Monocytes Relative: 11.2 % (ref 3.0–12.0)
Neutro Abs: 5.9 10*3/uL (ref 1.4–7.7)
RDW: 14.8 % — ABNORMAL HIGH (ref 11.5–14.6)
WBC: 8.4 10*3/uL (ref 4.5–10.5)

## 2013-03-24 LAB — HEPATIC FUNCTION PANEL
Albumin: 3.5 g/dL (ref 3.5–5.2)
Alkaline Phosphatase: 56 U/L (ref 39–117)

## 2013-03-24 LAB — HEMOGLOBIN A1C: Hgb A1c MFr Bld: 6.3 % (ref 4.6–6.5)

## 2013-03-24 LAB — URINALYSIS, ROUTINE W REFLEX MICROSCOPIC
Ketones, ur: NEGATIVE
Specific Gravity, Urine: 1.025 (ref 1.000–1.030)
Total Protein, Urine: NEGATIVE
Urine Glucose: NEGATIVE
pH: 6 (ref 5.0–8.0)

## 2013-03-24 LAB — BASIC METABOLIC PANEL
Calcium: 8.6 mg/dL (ref 8.4–10.5)
GFR: 94.16 mL/min (ref >60.00)
Glucose, Bld: 91 mg/dL (ref 70–99)
Sodium: 135 mEq/L (ref 135–145)

## 2013-03-24 MED ORDER — VARDENAFIL HCL 20 MG PO TABS: 20.0000 mg | ORAL_TABLET | Freq: Every day | ORAL | Status: DC | PRN

## 2013-03-24 NOTE — Assessment & Plan Note (Signed)

## 2013-03-24 NOTE — Patient Instructions (Addendum)
Please continue all other medications as before, and refills have been done if requested. You are given the cialis samples, and the coupon for levitra today Please keep your appointments with your specialists as you have planned, and the stress test Please go to the LAB in the Basement (turn left off the elevator) for the tests to be done today You will be contacted by phone if any changes need to be made immediately.  Otherwise, you will receive a letter about your results with an explanation Please remember to sign up for My Chart if you have not done so, as this will be important to you in the future with finding out test results, communicating by private email, and scheduling acute appointments online when needed. Please return in 6 months, or sooner if needed

## 2013-03-24 NOTE — Assessment & Plan Note (Signed)
Gave 1 box sample cialis 5 mg , and rx for levitra asd,  to f/u any worsening symptoms or concerns

## 2013-03-24 NOTE — Assessment & Plan Note (Signed)
Also gave samples nicoderm

## 2013-03-24 NOTE — Progress Notes (Signed)
Subjective:    Patient ID: Richard Hopkins, male    DOB: 10-18-1946, 67 y.o.   MRN: [id number]  HPI Here for wellness and f/u;  Overall doing ok;  Pt denies CP, worsening SOB, DOE, wheezing, orthopnea, PND, worsening LE edema, palpitations, dizziness or syncope.  Pt denies neurological change such as new headache, facial or extremity weakness.  Pt denies polydipsia, polyuria, or low sugar symptoms. Pt states overall good compliance with treatment and medications, good tolerability, and has been trying to follow lower cholesterol diet.  Pt denies worsening depressive symptoms, suicidal ideation or panic. No fever, night sweats, wt loss, loss of appetite, or other constitutional symptoms.  Pt states good ability with ADL's, has low fall risk, home safety reviewed and adequate, no other significant changes in hearing or vision, and only occasionally active with exercise.  Asks for nicoderm samples, and levitra samples if able. Using e-cigs now. No acute complaints. Recently has seen pulm, cardiology as documented.  EGD neg for malignancy per GI as well.  Stress test pending Past Medical History  Diagnosis Date  . THRUSH   . ADENOMATOUS COLONIC POLYP   . DIABETES MELLITUS, TYPE II   . HYPERLIPIDEMIA   . SMOKER   . BLURRED VISION   . HYPERTENSION   . ALLERGIC RHINITIS   . CHRONIC OBSTRUCTIVE PULMONARY DISEASE, ACUTE EXACERBATION   . COPD   . PULMONARY FIBROSIS   . ESOPHAGEAL STRICTURE   . GERD   . DIVERTICULAR DISEASE   . BENIGN PROSTATIC HYPERTROPHY   . Acute prostatitis   . EPIDIDYMO-ORCHITIS   . SKIN LESION   . LUMBAR DISC DISORDER   . Pain in soft tissues of limb   . MUSCLE CRAMPS   . FATIGUE   . MASS, SUPERFICIAL   . DYSPNEA   . Wheezing   . DYSPNEA/SHORTNESS OF BREATH   . Nocturia   . PSA, INCREASED   . ABNORMAL ELECTROCARDIOGRAM   . ERECTILE DYSFUNCTION, ORGANIC, HX OF   . SYNCOPE   . Brain tumor   . COPD (chronic obstructive pulmonary disease)   . Chronic steroid use     Past Surgical History  Procedure Laterality Date  . Back surgery      x 4 disabled after 2003  . Elbow arthroscopy      reports that he has been smoking Cigarettes.  He has a 50 pack-year smoking history. He has never used smokeless tobacco. He reports that  drinks alcohol. He reports that he does not use illicit drugs. family history includes Asthma in his mother; Diabetes in his mother; and Emphysema in his mother.  There is no history of Colon cancer. Allergies  Allergen Reactions  . Ace Inhibitors   . Cardura (Doxazosin Mesylate)     dizzy  . Hydrocodone Other (See Comments)    nauasea and dizziness  . Lisinopril     REACTION: cough  . Alfuzosin Rash   Current Outpatient Prescriptions on File Prior to Visit  Medication Sig Dispense Refill  . aspirin 81 MG EC tablet Take 81 mg by mouth daily.        . budesonide-formoterol (SYMBICORT) 160-4.5 MCG/ACT inhaler Inhale 2 puffs into the lungs 2 (two) times daily.  1 Inhaler  0  . cloNIDine (CATAPRES) 0.1 MG tablet TAKE ONE TABLET BY MOUTH TWICE DAILY  180 tablet  2  . ipratropium-albuterol (DUONEB) 0.5-2.5 (3) MG/3ML SOLN Take 3 mLs by nebulization every 6 (six) hours as needed.  360 mL  5  . lovastatin (MEVACOR) 20 MG tablet TAKE TWO TABLETS BY MOUTH EVERY DAY IN THE EVENING  180 tablet  3  . metFORMIN (GLUCOPHAGE) 500 MG tablet Take 1 tablet (500 mg total) by mouth 2 (two) times daily with a meal.  180 tablet  3  . Omeprazole 20 MG TBEC Take 1 tablet (20 mg total) by mouth daily.  90 each  3  . predniSONE (DELTASONE) 10 MG tablet Take 1 tablet (10 mg total) by mouth daily.  90 tablet  3  . traMADol (ULTRAM) 50 MG tablet Take 1 tablet (50 mg total) by mouth every 6 (six) hours as needed for pain.  60 tablet  1  . clotrimazole-betamethasone (LOTRISONE) cream Use as directed twice per day as needed  15 g  1  . Fluticasone-Salmeterol (ADVAIR DISKUS) 250-50 MCG/DOSE AEPB Inhale 1 puff into the lungs 2 (two) times daily.  1 each  11    No current facility-administered medications on file prior to visit.   Review of Systems Constitutional: Negative for diaphoresis, activity change, appetite change or unexpected weight change.  HENT: Negative for hearing loss, ear pain, facial swelling, mouth sores and neck stiffness.   Eyes: Negative for pain, redness and visual disturbance.  Respiratory: Negative for shortness of breath and wheezing.   Cardiovascular: Negative for chest pain and palpitations.  Gastrointestinal: Negative for diarrhea, blood in stool, abdominal distention or other pain Genitourinary: Negative for hematuria, flank pain or change in urine volume.  Musculoskeletal: Negative for myalgias and joint swelling.  Skin: Negative for color change and wound.  Neurological: Negative for syncope and numbness. other than noted Hematological: Negative for adenopathy.  Psychiatric/Behavioral: Negative for hallucinations, self-injury, decreased concentration and agitation.      Objective:   Physical Exam BP 122/86  Pulse 71  Temp(Src) 98.3 F (36.8 C) (Oral)  Ht 6\' 4"  (1.93 m)  Wt 217 lb 4 oz (98.544 kg)  BMI 26.46 kg/m2  SpO2 98% VS noted,  Constitutional: Pt is oriented to person, place, and time. Appears well-developed and well-nourished.  Head: Normocephalic and atraumatic.  Right Ear: External ear normal.  Left Ear: External ear normal.  Nose: Nose normal.  Mouth/Throat: Oropharynx is clear and moist.  Eyes: Conjunctivae and EOM are normal. Pupils are equal, round, and reactive to light.  Neck: Normal range of motion. Neck supple. No JVD present. No tracheal deviation present.  Cardiovascular: Normal rate, regular rhythm, normal heart sounds and intact distal pulses.   Pulmonary/Chest: Effort normal and breath sounds decreased bilat, no wheezes.  Abdominal: Soft. Bowel sounds are normal. There is no tenderness. No HSM  Musculoskeletal: Normal range of motion. Exhibits no edema.  Lymphadenopathy:  Has  no cervical adenopathy.  Neurological: Pt is alert and oriented to person, place, and time. Pt has normal reflexes. No cranial nerve deficit.  Skin: Skin is warm and dry. No rash noted.  Psychiatric:  Mild nervous. Behavior is normal.     Assessment & Plan:

## 2013-04-11 ENCOUNTER — Encounter: Payer: Medicare Other | Admitting: Nurse Practitioner

## 2013-04-12 ENCOUNTER — Encounter: Payer: Medicare Other | Admitting: Nurse Practitioner

## 2013-05-03 ENCOUNTER — Ambulatory Visit: Payer: Medicare Other | Admitting: Internal Medicine

## 2013-05-12 ENCOUNTER — Encounter: Payer: Self-pay | Admitting: Internal Medicine

## 2013-05-12 ENCOUNTER — Ambulatory Visit (INDEPENDENT_AMBULATORY_CARE_PROVIDER_SITE_OTHER): Payer: Medicare Other | Admitting: Internal Medicine

## 2013-05-12 VITALS — BP 140/68 | HR 79 | Temp 98.1°F | Ht 76.0 in | Wt 214.2 lb

## 2013-05-12 DIAGNOSIS — I10 Essential (primary) hypertension: Secondary | ICD-10-CM

## 2013-05-12 DIAGNOSIS — M109 Gout, unspecified: Secondary | ICD-10-CM | POA: Insufficient documentation

## 2013-05-12 DIAGNOSIS — J449 Chronic obstructive pulmonary disease, unspecified: Secondary | ICD-10-CM

## 2013-05-12 MED ORDER — ALLOPURINOL 100 MG PO TABS: 100.0000 mg | ORAL_TABLET | Freq: Every day | ORAL | Status: DC

## 2013-05-12 MED ORDER — PREDNISONE 10 MG PO TABS: ORAL_TABLET | ORAL | Status: DC

## 2013-05-12 MED ORDER — METHYLPREDNISOLONE ACETATE 80 MG/ML IJ SUSP
80.0000 mg | Freq: Once | INTRAMUSCULAR | Status: AC
  Administered 2013-05-12: 80 mg via INTRAMUSCULAR

## 2013-05-12 NOTE — Assessment & Plan Note (Signed)
stable overall by history and exam, recent data reviewed with pt, and pt to continue medical treatment as before,  to f/u any worsening symptoms or concerns SpO2 Readings from Last 3 Encounters:  05/12/13 94%  03/24/13 98%  03/15/13 97%

## 2013-05-12 NOTE — Progress Notes (Signed)
Subjective:    Patient ID: Richard Hopkins, male    DOB: [birth year], 67 y.o.   MRN: [id number]  HPI  Here with acute onset severe pain that he awoke with 2 days ago, very sensitive to touch with red/swelling but no fever, trauma. He recalls a much more minor episode maybe 2 yrs ago, also more recently had a similar episode to right great toe 2 wks ago, now improved.   Pt denies fever, wt loss, night sweats, loss of appetite, or other constitutional symptoms. Pt denies chest pain, increased sob or doe, wheezing, orthopnea, PND, increased LE swelling, palpitations, dizziness or syncope.  Pt denies new neurological symptoms such as new headache, or facial or extremity weakness or numbness Past Medical History  Diagnosis Date  . THRUSH   . ADENOMATOUS COLONIC POLYP   . DIABETES MELLITUS, TYPE II   . HYPERLIPIDEMIA   . SMOKER   . BLURRED VISION   . HYPERTENSION   . ALLERGIC RHINITIS   . CHRONIC OBSTRUCTIVE PULMONARY DISEASE, ACUTE EXACERBATION   . COPD   . PULMONARY FIBROSIS   . ESOPHAGEAL STRICTURE   . GERD   . DIVERTICULAR DISEASE   . BENIGN PROSTATIC HYPERTROPHY   . Acute prostatitis   . EPIDIDYMO-ORCHITIS   . SKIN LESION   . LUMBAR DISC DISORDER   . Pain in soft tissues of limb   . MUSCLE CRAMPS   . FATIGUE   . MASS, SUPERFICIAL   . DYSPNEA   . Wheezing   . DYSPNEA/SHORTNESS OF BREATH   . Nocturia   . PSA, INCREASED   . ABNORMAL ELECTROCARDIOGRAM   . ERECTILE DYSFUNCTION, ORGANIC, HX OF   . SYNCOPE   . Brain tumor   . COPD (chronic obstructive pulmonary disease)   . Chronic steroid use    Past Surgical History  Procedure Laterality Date  . Back surgery      x 4 disabled after 2003  . Elbow arthroscopy      reports that he has been smoking Cigarettes.  He has a 50 pack-year smoking history. He has never used smokeless tobacco. He reports that  drinks alcohol. He reports that he does not use illicit drugs. family history includes Asthma in his mother; Diabetes in his  mother; and Emphysema in his mother.  There is no history of Colon cancer. Allergies  Allergen Reactions  . Ace Inhibitors   . Cardura (Doxazosin Mesylate)     dizzy  . Hydrocodone Other (See Comments)    nauasea and dizziness  . Lisinopril     REACTION: cough  . Alfuzosin Rash   Current Outpatient Prescriptions on File Prior to Visit  Medication Sig Dispense Refill  . aspirin 81 MG EC tablet Take 81 mg by mouth daily.        . budesonide-formoterol (SYMBICORT) 160-4.5 MCG/ACT inhaler Inhale 2 puffs into the lungs 2 (two) times daily.  1 Inhaler  0  . cloNIDine (CATAPRES) 0.1 MG tablet TAKE ONE TABLET BY MOUTH TWICE DAILY  180 tablet  2  . Fluticasone-Salmeterol (ADVAIR DISKUS) 250-50 MCG/DOSE AEPB Inhale 1 puff into the lungs 2 (two) times daily.  1 each  11  . ipratropium-albuterol (DUONEB) 0.5-2.5 (3) MG/3ML SOLN Take 3 mLs by nebulization every 6 (six) hours as needed.  360 mL  5  . lovastatin (MEVACOR) 20 MG tablet TAKE TWO TABLETS BY MOUTH EVERY DAY IN THE EVENING  180 tablet  3  . metFORMIN (GLUCOPHAGE) 500 MG tablet Take 1  tablet (500 mg total) by mouth 2 (two) times daily with a meal.  180 tablet  3  . Omeprazole 20 MG TBEC Take 1 tablet (20 mg total) by mouth daily.  90 each  3  . predniSONE (DELTASONE) 10 MG tablet Take 1 tablet (10 mg total) by mouth daily.  90 tablet  3  . traMADol (ULTRAM) 50 MG tablet Take 1 tablet (50 mg total) by mouth every 6 (six) hours as needed for pain.  60 tablet  1  . vardenafil (LEVITRA) 20 MG tablet Take 1 tablet (20 mg total) by mouth daily as needed.  10 tablet  11   No current facility-administered medications on file prior to visit.   Review of Systems  Constitutional: Negative for unexpected weight change, or unusual diaphoresis  HENT: Negative for tinnitus.   Eyes: Negative for photophobia and visual disturbance.  Respiratory: Negative for choking and stridor.   Gastrointestinal: Negative for vomiting and blood in stool.   Genitourinary: Negative for hematuria and decreased urine volume.  Musculoskeletal: Negative for acute joint swelling Skin: Negative for color change and wound.  Neurological: Negative for tremors and numbness other than noted  Psychiatric/Behavioral: Negative for decreased concentration or  hyperactivity.       Objective:   Physical Exam BP 140/68  Pulse 79  Temp(Src) 98.1 F (36.7 C) (Oral)  Ht 6\' 4"  (1.93 m)  Wt 214 lb 4 oz (97.183 kg)  BMI 26.09 kg/m2  SpO2 94% VS noted,  Constitutional: Pt appears well-developed and well-nourished.  HENT: Head: NCAT.  Right Ear: External ear normal.  Left Ear: External ear normal.  Eyes: Conjunctivae and EOM are normal. Pupils are equal, round, and reactive to light.  Neck: Normal range of motion. Neck supple.  Cardiovascular: Normal rate and regular rhythm.   Pulmonary/Chest: Effort normal and breath sounds normal.  Left carpometacarpal with 2-3+ red/tender/swelling with some decreased ability make fist but thumb apposition ok Neurological: Pt is alert. Not confused  Skin: Skin is warm. No erythema.  Psychiatric: Pt behavior is normal. Thought content normal.     Assessment & Plan:

## 2013-05-12 NOTE — Assessment & Plan Note (Signed)
stable overall by history and exam, recent data reviewed with pt, and pt to continue medical treatment as before,  to f/u any worsening symptoms or concerns BP Readings from Last 3 Encounters:  05/12/13 140/68  03/24/13 122/86  03/18/13 167/103

## 2013-05-12 NOTE — Patient Instructions (Signed)
You had the steroid shot today Please take all new medication as prescribed - the temporary increased dose of prednisone Please take all new medication as prescribed - the Alloupurinol to help prevent gout but AFTER the prednisone is done Please continue all other medications as before, and refills have been done if requested. Please have the pharmacy call with any other refills you may need.

## 2013-05-12 NOTE — Assessment & Plan Note (Signed)
With hx of episode left thumb carpometacarpal a few yrs ago (minor), then 2 wks ago mild episode right great toe MTP now resolved, now left thumb again but mod to severe though slighltly improved with ibuprofen last night;  For depomedrol IM, predpack asd, and allopurinol preventive after finished tx, check uric acid next labs,  to f/u any worsening symptoms or concerns

## 2013-06-13 ENCOUNTER — Other Ambulatory Visit: Payer: Self-pay

## 2013-06-13 MED ORDER — METFORMIN HCL 500 MG PO TABS: 500.0000 mg | ORAL_TABLET | Freq: Two times a day (BID) | ORAL | Status: DC

## 2013-06-22 ENCOUNTER — Other Ambulatory Visit: Payer: Self-pay | Admitting: Internal Medicine

## 2013-08-11 ENCOUNTER — Encounter: Payer: Self-pay | Admitting: Internal Medicine

## 2013-08-11 ENCOUNTER — Ambulatory Visit (INDEPENDENT_AMBULATORY_CARE_PROVIDER_SITE_OTHER): Payer: Medicare Other | Admitting: Internal Medicine

## 2013-08-11 VITALS — BP 130/80 | HR 80 | Temp 98.2°F | Ht 76.0 in | Wt 221.5 lb

## 2013-08-11 DIAGNOSIS — J441 Chronic obstructive pulmonary disease with (acute) exacerbation: Secondary | ICD-10-CM

## 2013-08-11 DIAGNOSIS — J8410 Pulmonary fibrosis, unspecified: Secondary | ICD-10-CM

## 2013-08-11 DIAGNOSIS — E119 Type 2 diabetes mellitus without complications: Secondary | ICD-10-CM

## 2013-08-11 DIAGNOSIS — I10 Essential (primary) hypertension: Secondary | ICD-10-CM

## 2013-08-11 MED ORDER — METHYLPREDNISOLONE ACETATE 80 MG/ML IJ SUSP
80.0000 mg | Freq: Once | INTRAMUSCULAR | Status: AC
  Administered 2013-08-11: 80 mg via INTRAMUSCULAR

## 2013-08-11 NOTE — Progress Notes (Signed)
Subjective:    Patient ID: Richard Hopkins, male    DOB: 1946-02-02, 67 y.o.   MRN: [id number]  HPI  Here to f/u, c/o exertional intolerance with Weakness at 89 yds and often has to sit due to general weakness.  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  Thinks he keeps up with fluids.  Has been on long term prednisone, denies muscle pain.  Has ongoing mild nonprod cough,  Pt denies polydipsia, polyuria and cbg this am 150, usually less at home.  Pt states overall good compliance with meds. Asks for depomedrol IM as this has helped in the past. Has seen pulmonary as well with Pulm fibrosis Past Medical History  Diagnosis Date  . THRUSH   . ADENOMATOUS COLONIC POLYP   . DIABETES MELLITUS, TYPE II   . HYPERLIPIDEMIA   . SMOKER   . BLURRED VISION   . HYPERTENSION   . ALLERGIC RHINITIS   . CHRONIC OBSTRUCTIVE PULMONARY DISEASE, ACUTE EXACERBATION   . COPD   . PULMONARY FIBROSIS   . ESOPHAGEAL STRICTURE   . GERD   . DIVERTICULAR DISEASE   . BENIGN PROSTATIC HYPERTROPHY   . Acute prostatitis   . EPIDIDYMO-ORCHITIS   . SKIN LESION   . LUMBAR DISC DISORDER   . Pain in soft tissues of limb   . MUSCLE CRAMPS   . FATIGUE   . MASS, SUPERFICIAL   . DYSPNEA   . Wheezing   . DYSPNEA/SHORTNESS OF BREATH   . Nocturia   . PSA, INCREASED   . ABNORMAL ELECTROCARDIOGRAM   . ERECTILE DYSFUNCTION, ORGANIC, HX OF   . SYNCOPE   . Brain tumor   . COPD (chronic obstructive pulmonary disease)   . Chronic steroid use    Past Surgical History  Procedure Laterality Date  . Back surgery      x 4 disabled after 2003  . Elbow arthroscopy      reports that he has been smoking Cigarettes.  He has a 50 pack-year smoking history. He has never used smokeless tobacco. He reports that  drinks alcohol. He reports that he does not  use illicit drugs. family history includes Asthma in his mother; Diabetes in his mother; Emphysema in his mother. There is no history of Colon cancer. Allergies  Allergen Reactions  . Ace Inhibitors   . Cardura [Doxazosin Mesylate]     dizzy  . Hydrocodone Other (See Comments)    nauasea and dizziness  . Lisinopril     REACTION: cough  . Alfuzosin Rash   Current Outpatient Prescriptions on File Prior to Visit  Medication Sig Dispense Refill  . allopurinol (ZYLOPRIM) 100 MG tablet Take 1 tablet (100 mg total) by mouth daily.  90 tablet  3  . aspirin 81 MG EC tablet Take 81 mg by mouth daily.        . budesonide-formoterol (SYMBICORT) 160-4.5 MCG/ACT inhaler Inhale 2 puffs into the lungs 2 (two) times daily.  1 Inhaler  0  . cloNIDine (CATAPRES) 0.1 MG tablet TAKE ONE TABLET BY MOUTH TWICE DAILY  180 tablet  3  . Fluticasone-Salmeterol (ADVAIR DISKUS) 250-50 MCG/DOSE AEPB Inhale 1 puff into the lungs 2 (two) times daily.  1 each  11  . ipratropium-albuterol (DUONEB) 0.5-2.5 (3) MG/3ML SOLN Take 3 mLs by nebulization every 6 (  six) hours as needed.  360 mL  5  . lovastatin (MEVACOR) 20 MG tablet TAKE TWO TABLETS BY MOUTH EVERY DAY IN THE EVENING  180 tablet  3  . metFORMIN (GLUCOPHAGE) 500 MG tablet Take 1 tablet (500 mg total) by mouth 2 (two) times daily with a meal.  60 tablet  11  . Omeprazole 20 MG TBEC Take 1 tablet (20 mg total) by mouth daily.  90 each  3  . predniSONE (DELTASONE) 10 MG tablet Take 1 tablet (10 mg total) by mouth daily.  90 tablet  3  . predniSONE (DELTASONE) 10 MG tablet 3 tabs by mouth per day for 3 days,2tabs per day for 3 days,1tab per day for 3 days  18 tablet  0  . traMADol (ULTRAM) 50 MG tablet Take 1 tablet (50 mg total) by mouth every 6 (six) hours as needed for pain.  60 tablet  1  . vardenafil (LEVITRA) 20 MG tablet Take 1 tablet (20 mg total) by mouth daily as needed.  10 tablet  11   No current facility-administered medications on file prior to visit.    ,Review of Systems  Constitutional: Negative for unexpected weight change, or unusual diaphoresis  HENT: Negative for tinnitus.   Eyes: Negative for photophobia and visual disturbance.  Respiratory: Negative for choking and stridor.   Gastrointestinal: Negative for vomiting and blood in stool.  Genitourinary: Negative for hematuria and decreased urine volume.  Musculoskeletal: Negative for acute joint swelling Skin: Negative for color change and wound.  Neurological: Negative for tremors and numbness other than noted  Psychiatric/Behavioral: Negative for decreased concentration or  hyperactivity.       Objective:   Physical Exam BP 130/80  Pulse 80  Temp(Src) 98.2 F (36.8 C) (Oral)  Ht 6\' 4"  (1.93 m)  Wt 221 lb 8 oz (100.472 kg)  BMI 26.97 kg/m2  SpO2 95% VS noted,  Constitutional: Pt appears well-developed and well-nourished.  HENT: Head: NCAT.  Right Ear: External ear normal.  Left Ear: External ear normal.  Eyes: Conjunctivae and EOM are normal. Pupils are equal, round, and reactive to light.  Neck: Normal range of motion. Neck supple.  Cardiovascular: Normal rate and regular rhythm.   Pulmonary/Chest: Effort normal and breath sounds decreased, few wheezes.  Abd:  Soft, NT, non-distended, + BS Neurological: Pt is alert. Not confused  Skin: Skin is warm. No erythema.  Psychiatric: Pt behavior is normal. Thought content normal.     Assessment & Plan:

## 2013-08-11 NOTE — Patient Instructions (Addendum)
You had the steroid shot today Please continue all other medications as before, and refills have been done if requested. Please have the pharmacy call with any other refills you may need. You will be contacted regarding the referral for: Dr Ocie Doyne  Please remember to sign up for My Chart if you have not done so, as this will be important to you in the future with finding out test results, communicating by private email, and scheduling acute appointments online when needed.  Please return in 3 months, or sooner if needed, with Lab testing done 3-5 days before

## 2013-08-13 NOTE — Assessment & Plan Note (Signed)
Mild to mod, for depomedrol IM,  to f/u any worsening symptoms or concerns 

## 2013-08-13 NOTE — Assessment & Plan Note (Signed)
stable overall by history and exam, recent data reviewed with pt, and pt to continue medical treatment as before,  to f/u any worsening symptoms or concerns Lab Results  Component Value Date   HGBA1C 6.3 03/24/2013    

## 2013-08-13 NOTE — Assessment & Plan Note (Signed)
For pulm f/u as planned, declines cxr today

## 2013-08-13 NOTE — Assessment & Plan Note (Signed)
stable overall by history and exam, recent data reviewed with pt, and pt to continue medical treatment as before,  to f/u any worsening symptoms or concerns BP Readings from Last 3 Encounters:  08/11/13 130/80  05/12/13 140/68  03/24/13 122/86

## 2013-08-16 ENCOUNTER — Ambulatory Visit: Payer: Medicare Other | Admitting: Internal Medicine

## 2013-09-27 ENCOUNTER — Ambulatory Visit: Payer: Medicare Other | Admitting: Internal Medicine

## 2013-10-06 ENCOUNTER — Ambulatory Visit (INDEPENDENT_AMBULATORY_CARE_PROVIDER_SITE_OTHER): Payer: Medicare Other | Admitting: Internal Medicine

## 2013-10-06 ENCOUNTER — Encounter: Payer: Self-pay | Admitting: Internal Medicine

## 2013-10-06 VITALS — BP 148/88 | HR 89 | Temp 98.5°F | Wt 220.4 lb

## 2013-10-06 DIAGNOSIS — R079 Chest pain, unspecified: Secondary | ICD-10-CM

## 2013-10-06 DIAGNOSIS — R0781 Pleurodynia: Secondary | ICD-10-CM

## 2013-10-06 MED ORDER — VALACYCLOVIR HCL 1 G PO TABS: 1000.0000 mg | ORAL_TABLET | Freq: Two times a day (BID) | ORAL | Status: DC

## 2013-10-06 NOTE — Patient Instructions (Signed)
For your rib pain:  May be Shingles, but might not be. By treating for Shingles now, we may be able to reduce the severity of Shingles.   Take Valtrex (also called valacyclovir), 1000 mg three times a day for 7 days. Try to space these doses out to once every 8 hours. Double your dose of prednisone to 10 mg in the morning and 10 mg. Shingles Shingles (herpes zoster) is an infection that is caused by the same virus that causes chickenpox (varicella). The infection causes a painful skin rash and fluid-filled blisters, which eventually break open, crust over, and heal. It may occur in any area of the body, but it usually affects only one side of the body or face. The pain of shingles usually lasts about 1 month. However, some people with shingles may develop long-term (chronic) pain in the affected area of the body. Shingles often occurs many years after the person had chickenpox. It is more common:  In people older than 50 years.  In people with weakened immune systems, such as those with HIV, AIDS, or cancer.  In people taking medicines that weaken the immune system, such as transplant medicines.  In people under great stress. CAUSES  Shingles is caused by the varicella zoster virus (VZV), which also causes chickenpox. After a person is infected with the virus, it can remain in the person's body for years in an inactive state (dormant). To cause shingles, the virus reactivates and breaks out as an infection in a nerve root. The virus can be spread from person to person (contagious) through contact with open blisters of the shingles rash. It will only spread to people who have not had chickenpox. When these people are exposed to the virus, they may develop chickenpox. They will not develop shingles. Once the blisters scab over, the person is no longer contagious and cannot spread the virus to others. SYMPTOMS  Shingles shows up in stages. The initial symptoms may be pain, itching, and tingling in an  area of the skin. This pain is usually described as burning, stabbing, or throbbing.In a few days or weeks, a painful red rash will appear in the area where the pain, itching, and tingling were felt. The rash is usually on one side of the body in a band or belt-like pattern. Then, the rash usually turns into fluid-filled blisters. They will scab over and dry up in approximately 2 3 weeks. Flu-like symptoms may also occur with the initial symptoms, the rash, or the blisters. These may include:  Fever.  Chills.  Headache.  Upset stomach. DIAGNOSIS  Your caregiver will perform a skin exam to diagnose shingles. Skin scrapings or fluid samples may also be taken from the blisters. This sample will be examined under a microscope or sent to a lab for further testing. TREATMENT  There is no specific cure for shingles. Your caregiver will likely prescribe medicines to help you manage the pain, recover faster, and avoid long-term problems. This may include antiviral drugs, anti-inflammatory drugs, and pain medicines. HOME CARE INSTRUCTIONS   Take a cool bath or apply cool compresses to the area of the rash or blisters as directed. This may help with the pain and itching.   Only take over-the-counter or prescription medicines as directed by your caregiver.   Rest as directed by your caregiver.  Keep your rash and blisters clean with mild soap and cool water or as directed by your caregiver.  Do not pick your blisters or scratch your rash.  Apply an anti-itch cream or numbing creams to the affected area as directed by your caregiver.  Keep your shingles rash covered with a loose bandage (dressing).  Avoid skin contact with:  Babies.   Pregnant women.   Children with eczema.   Elderly people with transplants.   People with chronic illnesses, such as leukemia or AIDS.   Wear loose-fitting clothing to help ease the pain of material rubbing against the rash.  Keep all follow-up  appointments with your caregiver.If the area involved is on your face, you may receive a referral for follow-up to a specialist, such as an eye doctor (ophthalmologist) or an ear, nose, and throat (ENT) doctor. Keeping all follow-up appointments will help you avoid eye complications, chronic pain, or disability.  SEEK IMMEDIATE MEDICAL CARE IF:   You have facial pain, pain around the eye area, or loss of feeling on one side of your face.  You have ear pain or ringing in your ear.  You have loss of taste.  Your pain is not relieved with prescribed medicines.   Your redness or swelling spreads.   You have more pain and swelling.  Your condition is worsening or has changed.   You have a feveror persistent symptoms for more than 2 3 days.  You have a fever and your symptoms suddenly get worse. MAKE SURE YOU:  Understand these instructions.  Will watch your condition.  Will get help right away if you are not doing well or get worse. Document Released: 12/08/2005 Document Revised: 09/01/2012 Document Reviewed: 07/22/2012 [facility] Patient Information 2014 [place], [place].

## 2013-10-06 NOTE — Assessment & Plan Note (Signed)
Pain on the right side--no symptoms of respiratory infection or focal findings on pulmonary exam to suggest that this related to COPD exacerbation. No pain on palpation or exacerbation on MSK exam to suggest MSK origin. Mild erythema and edema in patches along right rib cage suggests possible early Shingles. This was discussed with the patient who agrees to treat to Shingles with the hopes of reducing the severity of a Shingles infection if this is the etiology.   Plan:  Will double the patient's home prednisone dosing from 5 mg BID to 10 mg BID. Will also start Valtrex 1000 mg TID.

## 2013-10-06 NOTE — Progress Notes (Signed)
Subjective:     Patient ID: Richard Hopkins, male   DOB: 24-Oct-1946, 67 y.o.   MRN: [id number]  HPI Richard Hopkins is a 67 year old man with a history of COPD who presents for evaluation of pain in his right rib cage. It feels like a dull ache which is intermittent. It is worst when sitting and laying down.  He can feel that pain when he brings his right arm across his chest. He has not tried any medicine for it. He has a prescription for tramadol, but he does not take this. No pain on deep breathing.   He also is producing a white mucus that comes up in the morning; no cough. He has had pneumonia in the past, and remembers having pain in his rib cage. Denies any congestion or allergies. He has a history of sinus trouble, but has no sinus pressure recently. He takes symbicort for COPD which increases you risk for pneumonia.   Review of Systems General: No fever, chills, night sweats. No recent weight changes. CV: No chest pain or palpitations.  Pulm: Wheezing occasionally, not more than usual. Sometimes SOB, not associated with rib pain.  MSK: No myalgias or arthralgias.  Neuro: no dizziness or changes in vision.     Objective:   Physical Exam Filed Vitals:   10/06/13 1632  BP: 148/88  Pulse: 89  Temp: 98.5 F (36.9 C)   General: Sitting in the exam room in NAD.  HEENT: Some mild oropharyngeal erythema, no exudates. No sinus tenderness.  CV: RRR.  Pulm: CTAB, clear to percussion bilaterally. No wheezes or crackles. MSK: No pain on palpation of the right rib cage--minor pain on deep palpation in between the ribs spaces.  Derm: Some mild erythema and swelling in about 1x5 inch strips over the right rib cage.   Current Outpatient Prescriptions on File Prior to Visit  Medication Sig Dispense Refill  . allopurinol (ZYLOPRIM) 100 MG tablet Take 1 tablet (100 mg total) by mouth daily.  90 tablet  3  . aspirin 81 MG EC tablet Take 81 mg by mouth daily.        . budesonide-formoterol  (SYMBICORT) 160-4.5 MCG/ACT inhaler Inhale 2 puffs into the lungs 2 (two) times daily.  1 Inhaler  0  . cloNIDine (CATAPRES) 0.1 MG tablet TAKE ONE TABLET BY MOUTH TWICE DAILY  180 tablet  3  . Fluticasone-Salmeterol (ADVAIR DISKUS) 250-50 MCG/DOSE AEPB Inhale 1 puff into the lungs 2 (two) times daily.  1 each  11  . ipratropium-albuterol (DUONEB) 0.5-2.5 (3) MG/3ML SOLN Take 3 mLs by nebulization every 6 (six) hours as needed.  360 mL  5  . lovastatin (MEVACOR) 20 MG tablet TAKE TWO TABLETS BY MOUTH EVERY DAY IN THE EVENING  180 tablet  3  . metFORMIN (GLUCOPHAGE) 500 MG tablet Take 1 tablet (500 mg total) by mouth 2 (two) times daily with a meal.  60 tablet  11  . Omeprazole 20 MG TBEC Take 1 tablet (20 mg total) by mouth daily.  90 each  3  . predniSONE (DELTASONE) 10 MG tablet Take 1 tablet (10 mg total) by mouth daily.  90 tablet  3  . predniSONE (DELTASONE) 10 MG tablet 3 tabs by mouth per day for 3 days,2tabs per day for 3 days,1tab per day for 3 days  18 tablet  0  . traMADol (ULTRAM) 50 MG tablet Take 1 tablet (50 mg total) by mouth every 6 (six) hours as needed for  pain.  60 tablet  1  . vardenafil (LEVITRA) 20 MG tablet Take 1 tablet (20 mg total) by mouth daily as needed.  10 tablet  11   No current facility-administered medications on file prior to visit.       Assessment:     Richard Hopkins is a 67 year old man with a history of COPD and pneumonia who presents with some dull pain over his right lateral ribcage, which is present at rest and not exacerbated by arm movement and no concerning findings on pulmonary exam.     Plan:     See plan by problem list.

## 2013-10-12 ENCOUNTER — Ambulatory Visit: Payer: Medicare Other | Admitting: Internal Medicine

## 2013-11-03 ENCOUNTER — Other Ambulatory Visit: Payer: Self-pay | Admitting: Internal Medicine

## 2013-11-10 ENCOUNTER — Encounter: Payer: Self-pay | Admitting: Internal Medicine

## 2013-11-10 ENCOUNTER — Ambulatory Visit (INDEPENDENT_AMBULATORY_CARE_PROVIDER_SITE_OTHER): Payer: Medicare Other | Admitting: Internal Medicine

## 2013-11-10 VITALS — BP 132/80 | HR 106 | Temp 98.6°F | Ht 76.0 in | Wt 225.0 lb

## 2013-11-10 DIAGNOSIS — R918 Other nonspecific abnormal finding of lung field: Secondary | ICD-10-CM

## 2013-11-10 DIAGNOSIS — I10 Essential (primary) hypertension: Secondary | ICD-10-CM

## 2013-11-10 DIAGNOSIS — Z23 Encounter for immunization: Secondary | ICD-10-CM

## 2013-11-10 DIAGNOSIS — R351 Nocturia: Secondary | ICD-10-CM

## 2013-11-10 DIAGNOSIS — I2510 Atherosclerotic heart disease of native coronary artery without angina pectoris: Secondary | ICD-10-CM

## 2013-11-10 DIAGNOSIS — E119 Type 2 diabetes mellitus without complications: Secondary | ICD-10-CM

## 2013-11-10 NOTE — Progress Notes (Signed)
Pre-visit discussion using our clinic review tool. No additional management support is needed unless otherwise documented below in the visit note.  

## 2013-11-10 NOTE — Patient Instructions (Addendum)
You had the flu shot today  Please continue all other medications as before, and refills have been done if requested.  Please have the pharmacy call with any other refills you may need.  Please keep your appointments with your specialists as you may have planned  No further lab work today  You will be contacted regarding the referral for: CT chest - to f/u any change in the lung nodules (to see [facility] now)  You will be contacted regarding the referral for: Cardiology about the possible artery blockages, and Urology about the nighttime problem  Please return in 6 months, or sooner if needed

## 2013-11-10 NOTE — Progress Notes (Addendum)
Subjective:    Patient ID: Richard Hopkins, male    DOB: [birth year], 67 y.o.   MRN: [id number]  HPI  Here to f/u, mentions Energy level very low except for the week  He took 20 mg prednisone per Dr Debby Bud, now back to 10 mg with less energy again, worse sluggish after meals. Has some left ulnar neuritic pain and numbness to wrist s/p ulnar release in past, has bad habit of leaning on the elbows on the rail of the back porch, as well sleeping on left side.  Did see Gi/Dr Juanda Chance - no esoph cancer after recent CT mar 2014 with ? esoph thickening.  Did see cardiology regarding ? Left Main dz, no specific eval needed at the time. Due for f/u for the CT nodules.  Has also nocturia x 5 per night, saw urology and tx with a med that he did not tolerate, has not gone back, wonders if I can give him the one with all the ads on TV.  Pt denies polydipsia, polyuria, or low sugar symptoms such as weakness or confusion improved with po intake.  Pt states overall good compliance with meds, trying to follow lower cholesterol, diabetic diet, wt overall stable but little exercise however.    Past Medical History  Diagnosis Date  . THRUSH   . ADENOMATOUS COLONIC POLYP   . DIABETES MELLITUS, TYPE II   . HYPERLIPIDEMIA   . SMOKER   . BLURRED VISION   . HYPERTENSION   . ALLERGIC RHINITIS   . CHRONIC OBSTRUCTIVE PULMONARY DISEASE, ACUTE EXACERBATION   . COPD   . PULMONARY FIBROSIS   . ESOPHAGEAL STRICTURE   . GERD   . DIVERTICULAR DISEASE   . BENIGN PROSTATIC HYPERTROPHY   . Acute prostatitis   . EPIDIDYMO-ORCHITIS   . SKIN LESION   . LUMBAR DISC DISORDER   . Pain in soft tissues of limb   . MUSCLE CRAMPS   . FATIGUE   . MASS, SUPERFICIAL   . DYSPNEA   . Wheezing   . DYSPNEA/SHORTNESS OF BREATH   . Nocturia   . PSA, INCREASED   . ABNORMAL ELECTROCARDIOGRAM   . ERECTILE DYSFUNCTION, ORGANIC, HX OF   . SYNCOPE   . Brain tumor   . COPD (chronic obstructive pulmonary disease)   . Chronic steroid use     Past Surgical History  Procedure Laterality Date  . Back surgery      x 4 disabled after 2003  . Elbow arthroscopy      reports that he has been smoking Cigarettes.  He has a 50 pack-year smoking history. He has never used smokeless tobacco. He reports that he drinks alcohol. He reports that he does not use illicit drugs. family history includes Asthma in his mother; Diabetes in his mother; Emphysema in his mother. There is no history of Colon cancer. Allergies  Allergen Reactions  . Ace Inhibitors   . Cardura [Doxazosin Mesylate]     dizzy  . Hydrocodone Other (See Comments)    nauasea and dizziness  . Lisinopril     REACTION: cough  . Alfuzosin Rash   Current Outpatient Prescriptions on File Prior to Visit  Medication Sig Dispense Refill  . allopurinol (ZYLOPRIM) 100 MG tablet Take 1 tablet (100 mg total) by mouth daily.  90 tablet  3  . aspirin 81 MG EC tablet Take 81 mg by mouth daily.        . budesonide-formoterol (SYMBICORT) 160-4.5 MCG/ACT inhaler Inhale 2  puffs into the lungs 2 (two) times daily.  1 Inhaler  0  . cloNIDine (CATAPRES) 0.1 MG tablet TAKE ONE TABLET BY MOUTH TWICE DAILY  180 tablet  3  . Fluticasone-Salmeterol (ADVAIR DISKUS) 250-50 MCG/DOSE AEPB Inhale 1 puff into the lungs 2 (two) times daily.  1 each  11  . ipratropium-albuterol (DUONEB) 0.5-2.5 (3) MG/3ML SOLN Take 3 mLs by nebulization every 6 (six) hours as needed.  360 mL  5  . lovastatin (MEVACOR) 20 MG tablet TAKE TWO TABLETS BY MOUTH EVERY DAY IN THE EVENING  180 tablet  3  . metFORMIN (GLUCOPHAGE) 500 MG tablet Take 1 tablet (500 mg total) by mouth 2 (two) times daily with a meal.  60 tablet  11  . Omeprazole 20 MG TBEC Take 1 tablet (20 mg total) by mouth daily.  90 each  3  . predniSONE (DELTASONE) 10 MG tablet Take 1 tablet (10 mg total) by mouth daily.  90 tablet  3  . predniSONE (DELTASONE) 10 MG tablet 3 tabs by mouth per day for 3 days,2tabs per day for 3 days,1tab per day for 3 days  18  tablet  0  . traMADol (ULTRAM) 50 MG tablet Take 1 tablet (50 mg total) by mouth every 6 (six) hours as needed for pain.  60 tablet  1  . valACYclovir (VALTREX) 1000 MG tablet Take 1 tablet (1,000 mg total) by mouth 2 (two) times daily.  21 tablet  0  . vardenafil (LEVITRA) 20 MG tablet Take 1 tablet (20 mg total) by mouth daily as needed.  10 tablet  11   No current facility-administered medications on file prior to visit.   Review of Systems  Constitutional: Negative for unexpected weight change, or unusual diaphoresis  HENT: Negative for tinnitus.   Eyes: Negative for photophobia and visual disturbance.  Respiratory: Negative for choking and stridor.   Gastrointestinal: Negative for vomiting and blood in stool.  Genitourinary: Negative for hematuria and decreased urine volume.  Musculoskeletal: Negative for acute joint swelling Skin: Negative for color change and wound.  Neurological: Negative for tremors and numbness other than noted  Psychiatric/Behavioral: Negative for decreased concentration or  hyperactivity.       Objective:   Physical Exam BP 132/80  Pulse 106  Temp(Src) 98.6 F (37 C) (Oral)  Ht 6\' 4"  (1.93 m)  Wt 225 lb (102.059 kg)  BMI 27.40 kg/m2  SpO2 96% VS noted,  Constitutional: Pt appears well-developed and well-nourished.  HENT: Head: NCAT.  Right Ear: External ear normal.  Left Ear: External ear normal.  Eyes: Conjunctivae and EOM are normal. Pupils are equal, round, and reactive to light.  Neck: Normal range of motion. Neck supple.  Cardiovascular: Normal rate and regular rhythm.   Pulmonary/Chest: Effort normal and breath sounds normal.  Abd:  Soft, NT, non-distended, + BS Neurological: Pt is alert. Not confused  Skin: Skin is warm. No erythema.  Psychiatric: Pt behavior is normal. Thought content normal.     Assessment & Plan:  Quality Measures addressed:  Diabetes Blood Pressure < 140/90: pt declines further medication change at this  time ACE/ARB therapy for CAD, Diabetes, and/or LVSD: pt declines further medication

## 2013-11-11 ENCOUNTER — Ambulatory Visit: Payer: Medicare Other | Admitting: Nurse Practitioner

## 2013-11-13 NOTE — Assessment & Plan Note (Signed)
stable overall by history and exam, recent data reviewed with pt, and pt to continue medical treatment as before,  to f/u any worsening symptoms or concerns BP Readings from Last 3 Encounters:  11/10/13 132/80  10/06/13 148/88  08/11/13 130/80

## 2013-11-13 NOTE — Assessment & Plan Note (Signed)
Etiology unclear, for urology referral 

## 2013-11-13 NOTE — Assessment & Plan Note (Signed)
stable overall by history and exam, recent data reviewed with pt, and pt to continue medical treatment as before,  to f/u any worsening symptoms or concerns Lab Results  Component Value Date   HGBA1C 6.3 03/24/2013   Chronic steroid use noted. to f/u any worsening symptoms or concerns

## 2013-11-14 ENCOUNTER — Ambulatory Visit: Payer: Medicare Other | Admitting: Nurse Practitioner

## 2013-11-16 ENCOUNTER — Ambulatory Visit (INDEPENDENT_AMBULATORY_CARE_PROVIDER_SITE_OTHER)
Admission: RE | Admit: 2013-11-16 | Discharge: 2013-11-16 | Disposition: A | Discharge status: Home / Self Care | Payer: Medicare Other | Source: Ambulatory Visit | Attending: Internal Medicine | Admitting: Internal Medicine

## 2013-11-16 ENCOUNTER — Encounter: Payer: Self-pay | Admitting: Internal Medicine

## 2013-11-16 DIAGNOSIS — R918 Other nonspecific abnormal finding of lung field: Secondary | ICD-10-CM

## 2013-12-21 ENCOUNTER — Other Ambulatory Visit: Payer: Self-pay | Admitting: Internal Medicine

## 2014-02-09 ENCOUNTER — Encounter: Payer: Self-pay | Admitting: Internal Medicine

## 2014-02-09 ENCOUNTER — Ambulatory Visit: Payer: Medicare Other | Admitting: Internal Medicine

## 2014-02-09 ENCOUNTER — Ambulatory Visit (INDEPENDENT_AMBULATORY_CARE_PROVIDER_SITE_OTHER): Payer: Medicare Other | Admitting: Internal Medicine

## 2014-02-09 VITALS — BP 154/92 | HR 91 | Temp 98.0°F | Ht 76.0 in | Wt 227.4 lb

## 2014-02-09 DIAGNOSIS — M79609 Pain in unspecified limb: Secondary | ICD-10-CM

## 2014-02-09 DIAGNOSIS — L03113 Cellulitis of right upper limb: Secondary | ICD-10-CM

## 2014-02-09 DIAGNOSIS — IMO0002 Reserved for concepts with insufficient information to code with codable children: Secondary | ICD-10-CM

## 2014-02-09 DIAGNOSIS — M79605 Pain in left leg: Secondary | ICD-10-CM

## 2014-02-09 DIAGNOSIS — I10 Essential (primary) hypertension: Secondary | ICD-10-CM

## 2014-02-09 DIAGNOSIS — J449 Chronic obstructive pulmonary disease, unspecified: Secondary | ICD-10-CM

## 2014-02-09 MED ORDER — PREDNISONE 10 MG PO TABS: ORAL_TABLET | ORAL | Status: DC

## 2014-02-09 MED ORDER — DOXYCYCLINE HYCLATE 100 MG PO TABS: 100.0000 mg | ORAL_TABLET | Freq: Two times a day (BID) | ORAL | Status: DC

## 2014-02-09 NOTE — Assessment & Plan Note (Signed)
Mild elev today, likely situational, stable overall by history and exam, recent data reviewed with pt, and pt to continue medical treatment as before,  to f/u any worsening symptoms or concerns BP Readings from Last 3 Encounters:  02/09/14 154/92  11/10/13 132/80  10/06/13 148/88

## 2014-02-09 NOTE — Progress Notes (Signed)
Pre-visit discussion using our clinic review tool. No additional management support is needed unless otherwise documented below in the visit note.  

## 2014-02-09 NOTE — Patient Instructions (Signed)
Please take all new medication as prescribed - the antibiotic Please continue all other medications as before, and refills have been done if requested- the prednisone  Please have the pharmacy call with any other refills you may need.  Please call if the arm is not improved in 2-4 days, for dermatology referral

## 2014-02-09 NOTE — Assessment & Plan Note (Signed)
Presumed infectious today, Mild to mod, for antibx course,  to f/u any worsening symptoms or concerns if not improved, consider derm as I wonder about noninfectious rash as well

## 2014-02-09 NOTE — Progress Notes (Signed)
Subjective:    Patient ID: Richard Hopkins, male    DOB: 1946-07-19, 68 y.o.   MRN: [id number]  HPI  Here with 2 days onset red/tender/swelling area right arm, seemed to start at a site approx 2 cm distal to elbow, medial aspect, but in 1-2 days area involvement extends to entire post surface, though surprisingly relatively less pain than would seem for typical cellulitis. Pt denies chest pain, increased sob or doe, wheezing, orthopnea, PND, increased LE swelling, palpitations, dizziness or syncope. But does have left mid anterior leg spasms and pain he attributes to muscle cramps, but also has a new left mid ant leg 3 cm area red/warm nodular area.  Asks for prednisone refill, though has not run out. Past Medical History  Diagnosis Date  . THRUSH   . ADENOMATOUS COLONIC POLYP   . DIABETES MELLITUS, TYPE II   . HYPERLIPIDEMIA   . SMOKER   . BLURRED VISION   . HYPERTENSION   . ALLERGIC RHINITIS   . CHRONIC OBSTRUCTIVE PULMONARY DISEASE, ACUTE EXACERBATION   . COPD   . PULMONARY FIBROSIS   . ESOPHAGEAL STRICTURE   . GERD   . DIVERTICULAR DISEASE   . BENIGN PROSTATIC HYPERTROPHY   . Acute prostatitis   . EPIDIDYMO-ORCHITIS   . SKIN LESION   . LUMBAR DISC DISORDER   . Pain in soft tissues of limb   . MUSCLE CRAMPS   . FATIGUE   . MASS, SUPERFICIAL   . DYSPNEA   . Wheezing   . DYSPNEA/SHORTNESS OF BREATH   . Nocturia   . PSA, INCREASED   . ABNORMAL ELECTROCARDIOGRAM   . ERECTILE DYSFUNCTION, ORGANIC, HX OF   . SYNCOPE   . Brain tumor   . COPD (chronic obstructive pulmonary disease)   . Chronic steroid use    Past Surgical History  Procedure Laterality Date  . Back surgery      x 4 disabled after 2003  . Elbow arthroscopy      reports that he has been smoking Cigarettes.  He has a 50 pack-year smoking history. He has never used smokeless tobacco. He reports that he drinks alcohol. He reports that he does not use illicit drugs. family history includes Asthma in his  mother; Diabetes in his mother; Emphysema in his mother. There is no history of Colon cancer. Allergies  Allergen Reactions  . Ace Inhibitors   . Cardura [Doxazosin Mesylate]     dizzy  . Hydrocodone Other (See Comments)    nauasea and dizziness  . Lisinopril     REACTION: cough  . Alfuzosin Rash   Current Outpatient Prescriptions on File Prior to Visit  Medication Sig Dispense Refill  . allopurinol (ZYLOPRIM) 100 MG tablet Take 1 tablet (100 mg total) by mouth daily.  90 tablet  3  . aspirin 81 MG EC tablet Take 81 mg by mouth daily.        . budesonide-formoterol (SYMBICORT) 160-4.5 MCG/ACT inhaler Inhale 2 puffs into the lungs 2 (two) times daily.  1 Inhaler  0  . cloNIDine (CATAPRES) 0.1 MG tablet TAKE ONE TABLET BY MOUTH TWICE DAILY  180 tablet  3  . Fluticasone-Salmeterol (ADVAIR DISKUS) 250-50 MCG/DOSE AEPB Inhale 1 puff into the lungs 2 (two) times daily.  1 each  11  . ipratropium-albuterol (DUONEB) 0.5-2.5 (3) MG/3ML SOLN Take 3 mLs by nebulization every 6 (six) hours as needed.  360 mL  5  . lovastatin (MEVACOR) 20 MG tablet TAKE TWO  TABLETS BY MOUTH EVERY DAY IN THE EVENING  180 tablet  3  . metFORMIN (GLUCOPHAGE) 500 MG tablet Take 1 tablet (500 mg total) by mouth 2 (two) times daily with a meal.  60 tablet  11  . Omeprazole 20 MG TBEC Take 1 tablet (20 mg total) by mouth daily.  90 each  3  . traMADol (ULTRAM) 50 MG tablet Take 1 tablet (50 mg total) by mouth every 6 (six) hours as needed for pain.  60 tablet  1  . valACYclovir (VALTREX) 1000 MG tablet Take 1 tablet (1,000 mg total) by mouth 2 (two) times daily.  21 tablet  0  . vardenafil (LEVITRA) 20 MG tablet Take 1 tablet (20 mg total) by mouth daily as needed.  10 tablet  11   No current facility-administered medications on file prior to visit.   Review of Systems  Constitutional: Negative for unexpected weight change, or unusual diaphoresis  HENT: Negative for tinnitus.   Eyes: Negative for photophobia and visual  disturbance.  Respiratory: Negative for choking and stridor.   Gastrointestinal: Negative for vomiting and blood in stool.  Genitourinary: Negative for hematuria and decreased urine volume.  Musculoskeletal: Negative for acute joint swelling Skin: Negative for color change and wound.  Neurological: Negative for tremors and numbness other than noted  Psychiatric/Behavioral: Negative for decreased concentration or  hyperactivity.       Objective:   Physical Exam BP 154/92  Pulse 91  Temp(Src) 98 F (36.7 C) (Oral)  Ht 6\' 4"  (1.93 m)  Wt 227 lb 6 oz (103.137 kg)  BMI 27.69 kg/m2  SpO2 96% VS noted,  Constitutional: Pt appears well-developed and well-nourished.  HENT: Head: NCAT.  Right Ear: External ear normal.  Left Ear: External ear normal.  Eyes: Conjunctivae and EOM are normal. Pupils are equal, round, and reactive to light.  Neck: Normal range of motion. Neck supple.  Cardiovascular: Normal rate and regular rhythm.   Pulmonary/Chest: Effort normal and breath sounds normal.  Abd:  Soft, NT, non-distended, + BS Neurological: Pt is alert. Not confused  Skin: right post arm with entire post arm distal to elbow to wrist red/tender/swelling but less tender than typical cellultiis, no ulcer/drainage Also with 3 cm oval area mid left ant lower leg similar Psychiatric: Pt behavior is normal. Thought content normal.     Assessment & Plan:

## 2014-02-09 NOTE — Assessment & Plan Note (Signed)
stable overall by history and exam, recent data reviewed with pt, and pt to continue medical treatment as before,  to f/u any worsening symptoms or concerns, cont prednisone SpO2 Readings from Last 3 Encounters:  02/09/14 96%  11/10/13 96%  10/06/13 95%

## 2014-02-09 NOTE — Assessment & Plan Note (Signed)
Could be MSK, but with the small area rash, again I would consider noninfectious rash, consider derm eval

## 2014-02-17 ENCOUNTER — Telehealth: Payer: Self-pay

## 2014-02-17 NOTE — Telephone Encounter (Signed)
[facility] for OV in am= sat clinic

## 2014-02-17 NOTE — Telephone Encounter (Signed)
Patient has had 2 allergic reactions to antibiotic, had completed about 1/2 bottle ( lasted 2 to 3 seconds).  Both legs (left swollen) are hurting and having difficulty  before  walking.  Right arm still swollen, but doing better.  Patient concerned something serious is wrong needs OV or ??

## 2014-02-17 NOTE — Telephone Encounter (Signed)
Patient informed and transferred to scheduler to do so.

## 2014-02-18 ENCOUNTER — Encounter (HOSPITAL_COMMUNITY): Payer: Self-pay | Admitting: Emergency Medicine

## 2014-02-18 ENCOUNTER — Inpatient Hospital Stay (HOSPITAL_COMMUNITY)
Admission: EM | Admit: 2014-02-18 | Discharge: 2014-02-21 | DRG: 813 | Disposition: A | Discharge status: Home / Self Care | Payer: Medicare Other | Attending: Internal Medicine | Admitting: Internal Medicine

## 2014-02-18 ENCOUNTER — Ambulatory Visit: Payer: Medicare Other | Admitting: Internal Medicine

## 2014-02-18 DIAGNOSIS — K222 Esophageal obstruction: Secondary | ICD-10-CM

## 2014-02-18 DIAGNOSIS — B9789 Other viral agents as the cause of diseases classified elsewhere: Secondary | ICD-10-CM | POA: Diagnosis present

## 2014-02-18 DIAGNOSIS — D496 Neoplasm of unspecified behavior of brain: Secondary | ICD-10-CM

## 2014-02-18 DIAGNOSIS — D68318 Other hemorrhagic disorder due to intrinsic circulating anticoagulants, antibodies, or inhibitors: Principal | ICD-10-CM | POA: Diagnosis present

## 2014-02-18 DIAGNOSIS — R0781 Pleurodynia: Secondary | ICD-10-CM

## 2014-02-18 DIAGNOSIS — Z87448 Personal history of other diseases of urinary system: Secondary | ICD-10-CM

## 2014-02-18 DIAGNOSIS — E785 Hyperlipidemia, unspecified: Secondary | ICD-10-CM | POA: Diagnosis present

## 2014-02-18 DIAGNOSIS — D126 Benign neoplasm of colon, unspecified: Secondary | ICD-10-CM

## 2014-02-18 DIAGNOSIS — K5730 Diverticulosis of large intestine without perforation or abscess without bleeding: Secondary | ICD-10-CM

## 2014-02-18 DIAGNOSIS — M25076 Hemarthrosis, unspecified foot: Secondary | ICD-10-CM | POA: Diagnosis present

## 2014-02-18 DIAGNOSIS — D62 Acute posthemorrhagic anemia: Secondary | ICD-10-CM | POA: Diagnosis present

## 2014-02-18 DIAGNOSIS — M629 Disorder of muscle, unspecified: Secondary | ICD-10-CM | POA: Diagnosis present

## 2014-02-18 DIAGNOSIS — D689 Coagulation defect, unspecified: Secondary | ICD-10-CM

## 2014-02-18 DIAGNOSIS — R9431 Abnormal electrocardiogram [ECG] [EKG]: Secondary | ICD-10-CM

## 2014-02-18 DIAGNOSIS — R972 Elevated prostate specific antigen [PSA]: Secondary | ICD-10-CM

## 2014-02-18 DIAGNOSIS — R233 Spontaneous ecchymoses: Secondary | ICD-10-CM | POA: Diagnosis present

## 2014-02-18 DIAGNOSIS — J8410 Pulmonary fibrosis, unspecified: Secondary | ICD-10-CM

## 2014-02-18 DIAGNOSIS — M7042 Prepatellar bursitis, left knee: Secondary | ICD-10-CM

## 2014-02-18 DIAGNOSIS — L989 Disorder of the skin and subcutaneous tissue, unspecified: Secondary | ICD-10-CM

## 2014-02-18 DIAGNOSIS — M25073 Hemarthrosis, unspecified ankle: Secondary | ICD-10-CM | POA: Diagnosis present

## 2014-02-18 DIAGNOSIS — R55 Syncope and collapse: Secondary | ICD-10-CM

## 2014-02-18 DIAGNOSIS — I998 Other disorder of circulatory system: Secondary | ICD-10-CM

## 2014-02-18 DIAGNOSIS — Z860100 Personal history of colon polyps, unspecified: Secondary | ICD-10-CM

## 2014-02-18 DIAGNOSIS — J309 Allergic rhinitis, unspecified: Secondary | ICD-10-CM

## 2014-02-18 DIAGNOSIS — M7989 Other specified soft tissue disorders: Secondary | ICD-10-CM

## 2014-02-18 DIAGNOSIS — M79609 Pain in unspecified limb: Secondary | ICD-10-CM | POA: Diagnosis present

## 2014-02-18 DIAGNOSIS — J449 Chronic obstructive pulmonary disease, unspecified: Secondary | ICD-10-CM

## 2014-02-18 DIAGNOSIS — Z833 Family history of diabetes mellitus: Secondary | ICD-10-CM

## 2014-02-18 DIAGNOSIS — Z825 Family history of asthma and other chronic lower respiratory diseases: Secondary | ICD-10-CM

## 2014-02-18 DIAGNOSIS — Z0000 Encounter for general adult medical examination without abnormal findings: Secondary | ICD-10-CM

## 2014-02-18 DIAGNOSIS — B370 Candidal stomatitis: Secondary | ICD-10-CM | POA: Diagnosis not present

## 2014-02-18 DIAGNOSIS — F17200 Nicotine dependence, unspecified, uncomplicated: Secondary | ICD-10-CM | POA: Diagnosis present

## 2014-02-18 DIAGNOSIS — E119 Type 2 diabetes mellitus without complications: Secondary | ICD-10-CM | POA: Diagnosis present

## 2014-02-18 DIAGNOSIS — I10 Essential (primary) hypertension: Secondary | ICD-10-CM

## 2014-02-18 DIAGNOSIS — M542 Cervicalgia: Secondary | ICD-10-CM

## 2014-02-18 DIAGNOSIS — K219 Gastro-esophageal reflux disease without esophagitis: Secondary | ICD-10-CM | POA: Diagnosis present

## 2014-02-18 DIAGNOSIS — R5381 Other malaise: Secondary | ICD-10-CM

## 2014-02-18 DIAGNOSIS — M79605 Pain in left leg: Secondary | ICD-10-CM

## 2014-02-18 DIAGNOSIS — N400 Enlarged prostate without lower urinary tract symptoms: Secondary | ICD-10-CM | POA: Diagnosis present

## 2014-02-18 DIAGNOSIS — R5383 Other fatigue: Secondary | ICD-10-CM

## 2014-02-18 DIAGNOSIS — R29898 Other symptoms and signs involving the musculoskeletal system: Secondary | ICD-10-CM

## 2014-02-18 DIAGNOSIS — M519 Unspecified thoracic, thoracolumbar and lumbosacral intervertebral disc disorder: Secondary | ICD-10-CM

## 2014-02-18 DIAGNOSIS — M25039 Hemarthrosis, unspecified wrist: Secondary | ICD-10-CM | POA: Diagnosis present

## 2014-02-18 DIAGNOSIS — I2510 Atherosclerotic heart disease of native coronary artery without angina pectoris: Secondary | ICD-10-CM

## 2014-02-18 DIAGNOSIS — D72829 Elevated white blood cell count, unspecified: Secondary | ICD-10-CM

## 2014-02-18 DIAGNOSIS — J4489 Other specified chronic obstructive pulmonary disease: Secondary | ICD-10-CM | POA: Diagnosis present

## 2014-02-18 DIAGNOSIS — T148XXA Other injury of unspecified body region, initial encounter: Secondary | ICD-10-CM | POA: Diagnosis present

## 2014-02-18 DIAGNOSIS — L03113 Cellulitis of right upper limb: Secondary | ICD-10-CM

## 2014-02-18 DIAGNOSIS — M2420 Disorder of ligament, unspecified site: Secondary | ICD-10-CM | POA: Diagnosis present

## 2014-02-18 DIAGNOSIS — T380X5A Adverse effect of glucocorticoids and synthetic analogues, initial encounter: Secondary | ICD-10-CM | POA: Diagnosis present

## 2014-02-18 DIAGNOSIS — Z7982 Long term (current) use of aspirin: Secondary | ICD-10-CM

## 2014-02-18 DIAGNOSIS — IMO0002 Reserved for concepts with insufficient information to code with codable children: Secondary | ICD-10-CM

## 2014-02-18 DIAGNOSIS — R351 Nocturia: Secondary | ICD-10-CM

## 2014-02-18 DIAGNOSIS — R918 Other nonspecific abnormal finding of lung field: Secondary | ICD-10-CM

## 2014-02-18 DIAGNOSIS — R21 Rash and other nonspecific skin eruption: Secondary | ICD-10-CM

## 2014-02-18 LAB — CBC WITH DIFFERENTIAL/PLATELET
BASOS PCT: 0 % (ref 0–1)
Basophils Absolute: 0 10*3/uL (ref 0.0–0.1)
Eosinophils Absolute: 0.1 10*3/uL (ref 0.0–0.7)
Eosinophils Relative: 1 % (ref 0–5)
HEMATOCRIT: 36.7 % — AB (ref 39.0–52.0)
HEMOGLOBIN: 11.9 g/dL — AB (ref 13.0–17.0)
LYMPHS ABS: 1.8 10*3/uL (ref 0.7–4.0)
Lymphocytes Relative: 16 % (ref 12–46)
MCH: 28.1 pg (ref 26.0–34.0)
MCHC: 32.4 g/dL (ref 30.0–36.0)
MCV: 86.6 fL (ref 78.0–100.0)
MONO ABS: 1.3 10*3/uL — AB (ref 0.1–1.0)
MONOS PCT: 12 % (ref 3–12)
NEUTROS ABS: 7.8 10*3/uL — AB (ref 1.7–7.7)
Neutrophils Relative %: 70 % (ref 43–77)
Platelets: 242 10*3/uL (ref 150–400)
RBC: 4.24 MIL/uL (ref 4.22–5.81)
RDW: 13.7 % (ref 11.5–15.5)
WBC: 11 10*3/uL — ABNORMAL HIGH (ref 4.0–10.5)

## 2014-02-18 LAB — GLUCOSE, CAPILLARY
GLUCOSE-CAPILLARY: 81 mg/dL (ref 70–99)
GLUCOSE-CAPILLARY: 116 mg/dL — AB (ref 70–99)

## 2014-02-18 LAB — PROTIME-INR
INR: 1.04 (ref 0.00–1.49)
PROTHROMBIN TIME: 13.4 s (ref 11.6–15.2)

## 2014-02-18 LAB — COMPREHENSIVE METABOLIC PANEL
ALBUMIN: 3.3 g/dL — AB (ref 3.5–5.2)
ALT: 14 U/L (ref 0–53)
AST: 20 U/L (ref 0–37)
Alkaline Phosphatase: 62 U/L (ref 39–117)
BILIRUBIN TOTAL: 0.6 mg/dL (ref 0.3–1.2)
BUN: 27 mg/dL — ABNORMAL HIGH (ref 6–23)
CO2: 21 mEq/L (ref 19–32)
CREATININE: 1.32 mg/dL (ref 0.50–1.35)
Calcium: 9.2 mg/dL (ref 8.4–10.5)
Chloride: 100 mEq/L (ref 96–112)
GFR, EST AFRICAN AMERICAN: 61 mL/min — AB (ref >90)
GFR, EST NON AFRICAN AMERICAN: 53 mL/min — AB (ref >90)
GLUCOSE: 117 mg/dL — AB (ref 70–99)
Potassium: 4.5 mEq/L (ref 3.7–5.3)
Sodium: 136 mEq/L — ABNORMAL LOW (ref 137–147)
Total Protein: 7.1 g/dL (ref 6.0–8.3)

## 2014-02-18 LAB — APTT: aPTT: 83 seconds — ABNORMAL HIGH (ref 24–37)

## 2014-02-18 LAB — FACTOR 8 ASSAY: Coagulation Factor VIII: 1 %

## 2014-02-18 MED ORDER — METFORMIN HCL 500 MG PO TABS
500.0000 mg | ORAL_TABLET | Freq: Two times a day (BID) | ORAL | Status: DC
  Administered 2014-02-18 – 2014-02-19 (×3): 500 mg via ORAL
  Filled 2014-02-18 (×4): qty 1

## 2014-02-18 MED ORDER — NICOTINE 14 MG/24HR TD PT24
14.0000 mg | MEDICATED_PATCH | Freq: Every day | TRANSDERMAL | Status: DC
  Administered 2014-02-18 – 2014-02-21 (×4): 14 mg via TRANSDERMAL
  Filled 2014-02-18 (×4): qty 1

## 2014-02-18 MED ORDER — PANTOPRAZOLE SODIUM 40 MG PO TBEC
40.0000 mg | DELAYED_RELEASE_TABLET | Freq: Every day | ORAL | Status: DC
  Administered 2014-02-18 – 2014-02-21 (×4): 40 mg via ORAL
  Filled 2014-02-18 (×4): qty 1

## 2014-02-18 MED ORDER — PREDNISONE 50 MG PO TABS
100.0000 mg | ORAL_TABLET | Freq: Every day | ORAL | Status: DC
  Administered 2014-02-19 – 2014-02-20 (×2): 100 mg via ORAL
  Filled 2014-02-18 (×4): qty 2

## 2014-02-18 MED ORDER — TRAMADOL HCL 50 MG PO TABS: 50.0000 mg | ORAL_TABLET | Freq: Four times a day (QID) | ORAL | Status: DC | PRN

## 2014-02-18 MED ORDER — CLONIDINE HCL 0.1 MG PO TABS
0.1000 mg | ORAL_TABLET | Freq: Two times a day (BID) | ORAL | Status: DC
  Administered 2014-02-18 – 2014-02-19 (×3): 0.1 mg via ORAL
  Filled 2014-02-18 (×4): qty 1

## 2014-02-18 MED ORDER — PREDNISONE 50 MG PO TABS
80.0000 mg | ORAL_TABLET | Freq: Every day | ORAL | Status: DC
  Administered 2014-02-18: 80 mg via ORAL
  Filled 2014-02-18 (×2): qty 1

## 2014-02-18 MED ORDER — IPRATROPIUM-ALBUTEROL 0.5-2.5 (3) MG/3ML IN SOLN: 3.0000 mL | Freq: Four times a day (QID) | RESPIRATORY_TRACT | Status: DC | PRN

## 2014-02-18 MED ORDER — PREDNISONE 10 MG PO TABS
10.0000 mg | ORAL_TABLET | Freq: Every day | ORAL | Status: DC
  Filled 2014-02-18: qty 1

## 2014-02-18 MED ORDER — TRAMADOL HCL 50 MG PO TABS
50.0000 mg | ORAL_TABLET | Freq: Once | ORAL | Status: AC
  Administered 2014-02-18: 50 mg via ORAL
  Filled 2014-02-18: qty 1

## 2014-02-18 MED ORDER — SIMVASTATIN 10 MG PO TABS
10.0000 mg | ORAL_TABLET | Freq: Every day | ORAL | Status: DC
  Administered 2014-02-18 – 2014-02-20 (×3): 10 mg via ORAL
  Filled 2014-02-18 (×4): qty 1

## 2014-02-18 MED ORDER — COAGULATION FACTOR VIIA RECOMB 1 MG IV SOLR
90.0000 ug/kg | Freq: Once | INTRAVENOUS | Status: AC
  Administered 2014-02-18: 9000 ug via INTRAVENOUS
  Filled 2014-02-18: qty 9

## 2014-02-18 MED ORDER — OXYCODONE HCL 5 MG PO TABS
5.0000 mg | ORAL_TABLET | ORAL | Status: DC | PRN
  Administered 2014-02-19 – 2014-02-20 (×2): 5 mg via ORAL
  Filled 2014-02-18 (×2): qty 1

## 2014-02-18 MED ORDER — HYDROCODONE-ACETAMINOPHEN 5-325 MG PO TABS
1.0000 | ORAL_TABLET | ORAL | Status: DC | PRN
  Administered 2014-02-18 (×2): 1 via ORAL
  Filled 2014-02-18 (×2): qty 1

## 2014-02-18 MED ORDER — BUDESONIDE-FORMOTEROL FUMARATE 160-4.5 MCG/ACT IN AERO
2.0000 | INHALATION_SPRAY | Freq: Two times a day (BID) | RESPIRATORY_TRACT | Status: DC
  Administered 2014-02-19 – 2014-02-21 (×5): 2 via RESPIRATORY_TRACT
  Filled 2014-02-18: qty 6

## 2014-02-18 NOTE — Consult Note (Signed)
New Hematology/Oncology Consult   Referral FA:OZHYQM Graf     Reason for Referral: Spontaneous bruising with an elevated PTT   HPI: Mr. Umholtz reports an approximate 1 week history of spontaneous bruising. He saw Dr. Jenny Reichmann when he noted an area of discoloration of the right arm on 02/09/2014. He was noted to have erythema, tenderness, and swelling of the distal right arm. He was diagnosed with cellulitis and placed on doxycycline and prednisone. He reports the doxycycline caused nausea. This was discontinued.  The "bruising" has progressed over the past week and now involves the lower extremities. He has developed swelling and pain at the left lower leg. No mouth or nose bleeding. No fever. No history of a bleeding disorder. No recent new medications. No trauma. He reports no excessive bleeding with previous surgical procedures including dental extractions and back surgeries.  He presented to the emergency room today and was noted to have an elevated aPTT.    Past Medical History  Diagnosis Date  . THRUSH   . ADENOMATOUS COLONIC POLYP   . DIABETES MELLITUS, TYPE II   . HYPERLIPIDEMIA   . SMOKER   .    Marland Kitchen HYPERTENSION   . ALLERGIC RHINITIS   . CHRONIC OBSTRUCTIVE PULMONARY DISEASE, ACUTE EXACERBATION   . COPD   . PULMONARY FIBROSIS   . ESOPHAGEAL STRICTURE   . GERD   . DIVERTICULAR DISEASE   . BENIGN PROSTATIC HYPERTROPHY   . Acute prostatitis   . EPIDIDYMO-ORCHITIS   . [place]   .    .    .    .    .    .    .    .    . PSA, INCREASED   . ABNORMAL ELECTROCARDIOGRAM   . ERECTILE DYSFUNCTION, ORGANIC, HX OF   . SYNCOPE   . Brain tumor   . COPD (chronic obstructive pulmonary disease)   . Chronic steroid use   :  Past Surgical History  Procedure Laterality Date  . Back surgery      x 4 disabled after 2003  . Elbow arthroscopy    :  Current facility-administered medications:nicotine (NICODERM CQ - dosed in mg/24 hours) patch 14 mg, 14 mg,  Transdermal, Daily, Costin Karlyne Greenspan, MD Current outpatient prescriptions:acetaminophen (TYLENOL) 500 MG tablet, Take 1,000 mg by mouth every 6 (six) hours as needed for mild pain or headache., Disp: , Rfl: ;  aspirin 81 MG EC tablet, Take 81 mg by mouth every morning. , Disp: , Rfl: ;  aspirin EC 325 MG tablet, Take 325 mg by mouth every 6 (six) hours as needed for mild pain., Disp: , Rfl:  budesonide-formoterol (SYMBICORT) 160-4.5 MCG/ACT inhaler, Inhale 2 puffs into the lungs 2 (two) times daily., Disp: 1 Inhaler, Rfl: 0;  cloNIDine (CATAPRES) 0.1 MG tablet, Take 0.1 mg by mouth 2 (two) times daily., Disp: , Rfl: ;  Ibuprofen-Diphenhydramine Cit (ADVIL PM) 200-38 MG TABS, Take 1-2 tablets by mouth at bedtime as needed (pain/sleep)., Disp: , Rfl:  ipratropium-albuterol (DUONEB) 0.5-2.5 (3) MG/3ML SOLN, Take 3 mLs by nebulization every 6 (six) hours as needed (wheezing/shortness of breath)., Disp: , Rfl: ;  lovastatin (MEVACOR) 20 MG tablet, Take 40 mg by mouth daily at 6 PM., Disp: , Rfl: ;  metFORMIN (GLUCOPHAGE) 500 MG tablet, Take 1 tablet (500 mg total) by mouth 2 (two) times daily with a meal., Disp: 60 tablet, Rfl: 11 omeprazole (PRILOSEC) 20 MG capsule, Take 20  mg by mouth every morning., Disp: , Rfl: ;  predniSONE (DELTASONE) 10 MG tablet, Take 10 mg by mouth daily with breakfast., Disp: , Rfl: ;  doxycycline (VIBRA-TABS) 100 MG tablet, Take 1 tablet (100 mg total) by mouth 2 (two) times daily., Disp: 20 tablet, Rfl: 0;  vardenafil (LEVITRA) 20 MG tablet, Take 1 tablet (20 mg total) by mouth daily as needed., Disp: 10 tablet, Rfl: 11:  . nicotine  14 mg Transdermal Daily  :  Allergies  Allergen Reactions  . Ace Inhibitors Cough  . Cardura [Doxazosin Mesylate] Other (See Comments)    dizziness  . Doxycycline Nausea Only and Other (See Comments)    Loss of memory; "bad feeling," dizziness  . Hydrocodone Nausea Only and Other (See Comments)    nauasea and dizziness  . Lisinopril Cough     REACTION: cough  . Alfuzosin Rash  :  Family History  Problem Relation Age of Onset  . Diabetes Mother   . Colon cancer Neg Hx   . Asthma Mother   . Emphysema Mother     smoker  : No family history of a bleeding disorder   History   Social History  . Marital Status: Married    Spouse Name: N/A    Number of Children: 5  . Years of Education: N/A   Occupational History  . disabled  retired Recruitment consultant    Social History Main Topics  . Smoking status: Current Some Day Smoker -- 1.00 packs/day for 50 years    Types: Cigarettes    Last Attempt to Quit: 03/07/2013  . Smokeless tobacco: Never Used  . Alcohol Use: Yes  . Drug Use: No  . Sexual Activity: Not on file   No risk factor for HIV or hepatitis. No transfusion history.   Review of Systems:  Positives include: Spontaneous bruising over the arms and legs for the past one week with associated swelling and pain. Pain and swelling at the left ankle.  A complete ROS was otherwise negative.   Physical Exam:  Blood pressure 160/91, pulse 77, temperature 97.8 F (36.6 C), temperature source Oral, resp. rate 16, SpO2 97.00%.  HEENT: Mild thrush at the pharynx, no ulcers, no bleeding Lungs: Distant breath sounds, no respiratory distress Cardiac: Regular rate and rhythm Abdomen: No hepatosplenomegaly, nontender GU: Testes without mass  Vascular: Trace to 1+ edema at the left lower leg Lymph nodes: No cervical, supraclavicular, axillary, or inguinal nodes Neurologic: Alert and oriented, the motor exam appears intact in the upper and lower extremities Skin: Large ecchymoses at the right lower arm, right thigh, and left lower leg. Soft tissue swelling is noted at the right arm, right thigh, and left lower leg Musculoskeletal: Swelling at the left ankle and right wrist  LABS:   Recent Labs  02/18/14 0500  WBC 11.0*  HGB 11.9*  HCT 36.7*  PLT 242   ANC 7.8  PT 13.4, a PTT 83   Recent Labs  02/18/14 0500  NA  136*  K 4.5  CL 100  CO2 21  GLUCOSE 117*  BUN 27*  CREATININE 1.32  CALCIUM 9.2      RADIOLOGY:  No results found.  Assessment and Plan:   1. Spontaneous bruising with evidence of soft tissue hematomas 2. Right wrist and left ankle swelling-likely hemarthroses 3. Elevated aPTT 4. COPD 5. Diabetes 6. Hypertension 7. BPH  Mr. Votaw presents with spontaneous skin and soft tissue bleeding. The aPTT is elevated. 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 He does not have any apparent condition associated with factor VIII inhibitors. No recent new medications. 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 The differential diagnosis includes an inhibitor to factor IX.  Recommendations: 1. Stat a PTT mixing study, factor VIII level, and Bethesda inhibitor titer 2. We will begin prednisone/Cytoxan if a low factor VIII level is confirmed 3. We will consider treatment with recombinant factor VIII or FEIBA for bleeding.  Hematology will continue following him in the hospital.    Betsy Coder, MD 02/18/2014, 9:25 AM

## 2014-02-18 NOTE — ED Notes (Signed)
He is awake, alert and in no distress, having just been seen by Dr. Benay Spice.

## 2014-02-18 NOTE — Progress Notes (Signed)
The factor VIII level returned at 1%. The PTT mixing study and Bethesda titer are pending.  He appears to have increased swelling at the left ankle with progressive soft tissue swelling at the left calf and right thigh. He complains of pain at the right shoulder where there is a small ecchymosis/hematoma.  I discussed the case with the hematology service at [facility]. 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 No apparent associated condition, but we will evaluate him for a rheumatologic diagnosis and malignancies.  [facility] manages these patients with factor VIIa in the acute setting followed by rituximab.  I discussed the risk of factor VII a with the including the chance of developing a stroke, myocardial infarction, and venous thromboembolic disease. He agrees to proceed.  We will plan for rituximab therapy within the next one to 2 days. Hopefully he can avoid long-term steroid use.  Recommendations: 1. Begin factor VIIa treatment tonight 2. Prednisone for now and then rituximab 3. Check rheumatoid arthritis, SLE, and hepatitis serologies

## 2014-02-18 NOTE — ED Notes (Signed)
Pt arrived to the ED with a complaint of bruising on right arm and leg.  A small bruise is present on the left leg and the leg is swollen around the ankle.  Pt saw his doctor on Thursday and was prescribed antibiotics but had a reaction to the medication so he stopped.  Pt is complaining of left leg pain.

## 2014-02-18 NOTE — ED Provider Notes (Signed)
CSN: [id number]     Arrival date & time 02/18/14  0250 History   First MD Initiated Contact with Patient 02/18/14 0315     Chief Complaint  Patient presents with  . Bleeding/Bruising     (Consider location/radiation/quality/duration/timing/severity/associated sxs/prior Treatment) The history is provided by the patient.  68 year old male has noted bruising on his arms and legs with no trauma that he can recall. This is been occurring over the last one to 2 weeks. First bruises on his right forearm this was followed by a bruise on his left leg and then his right thigh. He is scheduled to see his doctor later today but states that he is unable to sleep tonight because of pain. He is complaining of dull achy pain in the areas where the bruises are. He does take aspirin but does not take Plavix or any anticoagulants. He has not noted any bleeding gums, and nosebleeds, hematuria, or hematochezia.  Past Medical History  Diagnosis Date  . THRUSH   . ADENOMATOUS COLONIC POLYP   . DIABETES MELLITUS, TYPE II   . HYPERLIPIDEMIA   . SMOKER   . BLURRED VISION   . HYPERTENSION   . ALLERGIC RHINITIS   . CHRONIC OBSTRUCTIVE PULMONARY DISEASE, ACUTE EXACERBATION   . COPD   . PULMONARY FIBROSIS   . ESOPHAGEAL STRICTURE   . GERD   . DIVERTICULAR DISEASE   . BENIGN PROSTATIC HYPERTROPHY   . Acute prostatitis   . EPIDIDYMO-ORCHITIS   . SKIN LESION   . LUMBAR DISC DISORDER   . Pain in soft tissues of limb   . MUSCLE CRAMPS   . FATIGUE   . MASS, SUPERFICIAL   . DYSPNEA   . Wheezing   . DYSPNEA/SHORTNESS OF BREATH   . Nocturia   . PSA, INCREASED   . ABNORMAL ELECTROCARDIOGRAM   . ERECTILE DYSFUNCTION, ORGANIC, HX OF   . SYNCOPE   . Brain tumor   . COPD (chronic obstructive pulmonary disease)   . Chronic steroid use    Past Surgical History  Procedure Laterality Date  . Back surgery      x 4 disabled after 2003  . Elbow arthroscopy     Family History  Problem Relation Age of Onset  .  Diabetes Mother   . Colon cancer Neg Hx   . Asthma Mother   . Emphysema Mother     smoker   History  Substance Use Topics  . Smoking status: Current Some Day Smoker -- 1.00 packs/day for 50 years    Types: Cigarettes    Last Attempt to Quit: 03/07/2013  . Smokeless tobacco: Never Used  . Alcohol Use: Yes    Review of Systems  All other systems reviewed and are negative.      Allergies  Ace inhibitors; Cardura; Hydrocodone; Lisinopril; and Alfuzosin  Home Medications   Current Outpatient Rx  Name  Route  Sig  Dispense  Refill  . allopurinol (ZYLOPRIM) 100 MG tablet   Oral   Take 1 tablet (100 mg total) by mouth daily.   90 tablet   3   . aspirin 81 MG EC tablet   Oral   Take 81 mg by mouth daily.           . budesonide-formoterol (SYMBICORT) 160-4.5 MCG/ACT inhaler   Inhalation   Inhale 2 puffs into the lungs 2 (two) times daily.   1 Inhaler   0   . cloNIDine (CATAPRES) 0.1 MG tablet  TAKE ONE TABLET BY MOUTH TWICE DAILY   180 tablet   3   . doxycycline (VIBRA-TABS) 100 MG tablet   Oral   Take 1 tablet (100 mg total) by mouth 2 (two) times daily.   20 tablet   0   . Fluticasone-Salmeterol (ADVAIR DISKUS) 250-50 MCG/DOSE AEPB   Inhalation   Inhale 1 puff into the lungs 2 (two) times daily.   1 each   11   . ipratropium-albuterol (DUONEB) 0.5-2.5 (3) MG/3ML SOLN   Nebulization   Take 3 mLs by nebulization every 6 (six) hours as needed.   360 mL   5   . lovastatin (MEVACOR) 20 MG tablet      TAKE TWO TABLETS BY MOUTH EVERY DAY IN THE EVENING   180 tablet   3   . metFORMIN (GLUCOPHAGE) 500 MG tablet   Oral   Take 1 tablet (500 mg total) by mouth 2 (two) times daily with a meal.   60 tablet   11   . Omeprazole 20 MG TBEC   Oral   Take 1 tablet (20 mg total) by mouth daily.   90 each   3   . predniSONE (DELTASONE) 10 MG tablet      TAKE ONE TABLET BY MOUTH EVERY DAY   90 tablet   1   . traMADol (ULTRAM) 50 MG tablet   Oral    Take 1 tablet (50 mg total) by mouth every 6 (six) hours as needed for pain.   60 tablet   1   . valACYclovir (VALTREX) 1000 MG tablet   Oral   Take 1 tablet (1,000 mg total) by mouth 2 (two) times daily.   21 tablet   0   . vardenafil (LEVITRA) 20 MG tablet   Oral   Take 1 tablet (20 mg total) by mouth daily as needed.   10 tablet   11    BP 184/99  Pulse 101  Temp(Src) 97.4 F (36.3 C) (Oral)  Resp 18  SpO2 100% Physical Exam  Nursing note and vitals reviewed.  68 year old male, resting comfortably and in no acute distress. Vital signs are significant for hypertension with blood pressure 184/99, and borderline tachycardia with heart rate 101. Oxygen saturation is 100%, which is normal. Head is normocephalic and atraumatic. PERRLA, EOMI. Oropharynx is clear. Neck is nontender and supple without adenopathy or JVD. Back is nontender and there is no CVA tenderness. Lungs are clear without rales, wheezes, or rhonchi. Chest is nontender. Heart has regular rate and rhythm without murmur. Abdomen is soft, flat, nontender without masses or hepatosplenomegaly and peristalsis is normoactive. Extremities: Ecchymoses are noted on the right forearm, right thigh, and left lower leg. The ecchymosis in the left lower leg is slowly resolving and there is evidence of pooling in the heel area and 2+ edema. In spite of the edema, there is no difference in calf circumference. Distal neurovascular exam is intact with strong pulses, prompt capillary refill, normal sensation. Skin is warm and dry without rash. Neurologic: Mental status is normal, cranial nerves are intact, there are no motor or sensory deficits.  ED Course  Procedures (including critical care time) Labs Review Results for orders placed during the hospital encounter of 02/18/14  CBC WITH DIFFERENTIAL      Result Value Ref Range   WBC 11.0 (*) 4.0 - 10.5 K/uL   RBC 4.24  4.22 - 5.81 MIL/uL   Hemoglobin 11.9 (*) 13.0 - 17.0 g/dL  HCT 36.7 (*) 39.0 - 52.0 %   MCV 86.6  78.0 - 100.0 fL   MCH 28.1  26.0 - 34.0 pg   MCHC 32.4  30.0 - 36.0 g/dL   RDW 13.7  11.5 - 15.5 %   Platelets 242  150 - 400 K/uL   Neutrophils Relative % 70  43 - 77 %   Neutro Abs 7.8 (*) 1.7 - 7.7 K/uL   Lymphocytes Relative 16  12 - 46 %   Lymphs Abs 1.8  0.7 - 4.0 K/uL   Monocytes Relative 12  3 - 12 %   Monocytes Absolute 1.3 (*) 0.1 - 1.0 K/uL   Eosinophils Relative 1  0 - 5 %   Eosinophils Absolute 0.1  0.0 - 0.7 K/uL   Basophils Relative 0  0 - 1 %   Basophils Absolute 0.0  0.0 - 0.1 K/uL  COMPREHENSIVE METABOLIC PANEL      Result Value Ref Range   Sodium 136 (*) 137 - 147 mEq/L   Potassium 4.5  3.7 - 5.3 mEq/L   Chloride 100  96 - 112 mEq/L   CO2 21  19 - 32 mEq/L   Glucose, Bld 117 (*) 70 - 99 mg/dL   BUN 27 (*) 6 - 23 mg/dL   Creatinine, Ser 1.32  0.50 - 1.35 mg/dL   Calcium 9.2  8.4 - 10.5 mg/dL   Total Protein 7.1  6.0 - 8.3 g/dL   Albumin 3.3 (*) 3.5 - 5.2 g/dL   AST 20  0 - 37 U/L   ALT 14  0 - 53 U/L   Alkaline Phosphatase 62  39 - 117 U/L   Total Bilirubin 0.6  0.3 - 1.2 mg/dL   GFR calc non Af Amer 53 (*) >90 mL/min   GFR calc Af Amer 61 (*) >90 mL/min  PROTIME-INR      Result Value Ref Range   Prothrombin Time 13.4  11.6 - 15.2 seconds   INR 1.04  0.00 - 1.49  APTT      Result Value Ref Range   aPTT 83 (*) 24 - 37 seconds   MDM   Final diagnoses:  Coagulopathy    Easy bruising of uncertain cause. CBC will be obtained to evaluate platelet count, INR and PTT will be checked and metabolic panel be checked to evaluate for possible renal or liver disease.  Platelet count and INR normal, but PTT is significantly elevated. This would explain his bruising. Reason for elevated PTT is not clear. Case will be discussed with hospitalist to consider observation admission for further workup of his coagulopathy.  Delora Fuel, MD 63/78/58 [id number]

## 2014-02-18 NOTE — Progress Notes (Signed)
*  Preliminary Results* Bilateral lower extremity venous duplex completed. Bilateral lower extremities are negative for deep vein thrombosis. There is no evidence of Baker's cyst bilaterally.  02/18/2014  Maudry Mayhew, RVT, RDCS, RDMS

## 2014-02-18 NOTE — H&P (Addendum)
History and Physical    Richard Hopkins YBO:[id number] DOB: 06-06-1946 DOA: 02/18/2014  Referring physician: Dr. Roxanne Mins PCP: Cathlean Cower, MD  Specialists: Hematology, Dr. Benay Spice  Chief Complaint: brusing  HPI: Richard Hopkins is a 68 y.o. male has a past medical history significant for COPD, asthma, HTN, HLD, CAD, tobacco abuse, presents to the ED with bruising on his right thigh, right arm, left ankle. He initially saw a small area of bruising on his left thigh and arm, presented to his PCP who started him on an antibiotic for presumed cellulitis, without improvement. His bruising has gotten worse and it was so painful that he decided to present to the ED. Blood work show isolated elevated aPTT with normal INR and Prothrombin time. His blood work is otherwise normal including normal platelets and Hb of 11.9. Patient denies fever/chills, denies SOB, chest pain, has no lightheadedness or dizziness. Has no abdominal pain, nausea/vomiting or diarrhea.   Review of Systems: as per HPI otherwise negative  Past Medical History  Diagnosis Date  . THRUSH   . ADENOMATOUS COLONIC POLYP   . DIABETES MELLITUS, TYPE II   . HYPERLIPIDEMIA   . SMOKER   . BLURRED VISION   . HYPERTENSION   . ALLERGIC RHINITIS   . CHRONIC OBSTRUCTIVE PULMONARY DISEASE, ACUTE EXACERBATION   . COPD   . PULMONARY FIBROSIS   . ESOPHAGEAL STRICTURE   . GERD   . DIVERTICULAR DISEASE   . BENIGN PROSTATIC HYPERTROPHY   . Acute prostatitis   . EPIDIDYMO-ORCHITIS   . SKIN LESION   . LUMBAR DISC DISORDER   . Pain in soft tissues of limb   . MUSCLE CRAMPS   . FATIGUE   . MASS, SUPERFICIAL   . DYSPNEA   . Wheezing   . DYSPNEA/SHORTNESS OF BREATH   . Nocturia   . PSA, INCREASED   . ABNORMAL ELECTROCARDIOGRAM   . ERECTILE DYSFUNCTION, ORGANIC, HX OF   . SYNCOPE   . Brain tumor   . COPD (chronic obstructive pulmonary disease)   . Chronic steroid use    Past Surgical History  Procedure Laterality Date  . Back  surgery      x 4 disabled after 2003  . Elbow arthroscopy     Social History:  reports that he has been smoking Cigarettes.  He has a 50 pack-year smoking history. He has never used smokeless tobacco. He reports that he drinks alcohol. He reports that he does not use illicit drugs.  Allergies  Allergen Reactions  . Ace Inhibitors Cough  . Cardura [Doxazosin Mesylate] Other (See Comments)    dizziness  . Doxycycline Nausea Only and Other (See Comments)    Loss of memory; "bad feeling," dizziness  . Hydrocodone Nausea Only and Other (See Comments)    nauasea and dizziness  . Lisinopril Cough    REACTION: cough  . Alfuzosin Rash    Family History  Problem Relation Age of Onset  . Diabetes Mother   . Colon cancer Neg Hx   . Asthma Mother   . Emphysema Mother     smoker     Prior to Admission medications   Medication Sig Start Date End Date Taking? Authorizing Provider  acetaminophen (TYLENOL) 500 MG tablet Take 1,000 mg by mouth every 6 (six) hours as needed for mild pain or headache.   Yes Historical Provider, MD  aspirin 81 MG EC tablet Take 81 mg by mouth every morning.    Yes Historical Provider,  MD  aspirin EC 325 MG tablet Take 325 mg by mouth every 6 (six) hours as needed for mild pain.   Yes Historical Provider, MD  budesonide-formoterol (SYMBICORT) 160-4.5 MCG/ACT inhaler Inhale 2 puffs into the lungs 2 (two) times daily. 12/29/12  Yes Tanda Rockers, MD  cloNIDine (CATAPRES) 0.1 MG tablet Take 0.1 mg by mouth 2 (two) times daily.   Yes Historical Provider, MD  Ibuprofen-Diphenhydramine Cit (ADVIL PM) 200-38 MG TABS Take 1-2 tablets by mouth at bedtime as needed (pain/sleep).   Yes Historical Provider, MD  ipratropium-albuterol (DUONEB) 0.5-2.5 (3) MG/3ML SOLN Take 3 mLs by nebulization every 6 (six) hours as needed (wheezing/shortness of breath).   Yes Historical Provider, MD  lovastatin (MEVACOR) 20 MG tablet Take 40 mg by mouth daily at 6 PM.   Yes Historical Provider, MD    metFORMIN (GLUCOPHAGE) 500 MG tablet Take 1 tablet (500 mg total) by mouth 2 (two) times daily with a meal. 06/13/13  Yes Biagio Borg, MD  omeprazole (PRILOSEC) 20 MG capsule Take 20 mg by mouth every morning.   Yes Historical Provider, MD  predniSONE (DELTASONE) 10 MG tablet Take 10 mg by mouth daily with breakfast. 02/09/14  Yes Historical Provider, MD  doxycycline (VIBRA-TABS) 100 MG tablet Take 1 tablet (100 mg total) by mouth 2 (two) times daily. 02/09/14   Biagio Borg, MD  vardenafil (LEVITRA) 20 MG tablet Take 1 tablet (20 mg total) by mouth daily as needed. 03/24/13   Biagio Borg, MD   Physical Exam: Filed Vitals:   02/18/14 0257 02/18/14 0817  BP: 184/99 160/91  Pulse: 101 77  Temp: 97.4 F (36.3 C) 97.8 F (36.6 C)  TempSrc: Oral Oral  Resp: 18 16  SpO2: 100% 97%     General:  No apparent distress  Eyes: PERRL, EOMI, no scleral icterus  ENT: moist oropharynx  Neck: supple, no JVD  Cardiovascular: regular rate without MRG; 2+ peripheral pulses  Respiratory: CTA biL, good air movement, scant wheezing  Abdomen: soft, non tender to palpation, positive bowel sounds, no guarding, no rebound  Skin: no rashes. Large bruising on right lateral thigh, right lateral elbow, forearm, left pretibial area, left ankle.   Musculoskeletal: no peripheral edema. Left foot swelling, mild tender to palpation.   Psychiatric: normal mood and affect  Neurologic: CN 2-12 grossly intact, MS 5/5 in all 4  Labs on Admission:  Basic Metabolic Panel:  Recent Labs Lab 02/18/14 0500  NA 136*  K 4.5  CL 100  CO2 21  GLUCOSE 117*  BUN 27*  CREATININE 1.32  CALCIUM 9.2   Liver Function Tests:  Recent Labs Lab 02/18/14 0500  AST 20  ALT 14  ALKPHOS 62  BILITOT 0.6  PROT 7.1  ALBUMIN 3.3*   CBC:  Recent Labs Lab 02/18/14 0500  WBC 11.0*  NEUTROABS 7.8*  HGB 11.9*  HCT 36.7*  MCV 86.6  PLT 242    EKG: Independently reviewed.  Assessment/Plan Principal Problem:    Coagulopathy Active Problems:   DIABETES MELLITUS, TYPE II   HYPERLIPIDEMIA   SMOKER   HYPERTENSION   COPD   Bruising     Elevated isolated aPTT - hematology consultes, appreciate input. We'll obtain a PTT mixing study, factor VIII stat. ?autoimmune process.  COPD - continue Symbicort Tobacco abuse - nicotine patch he is counseled for cessation. HTN - continue clonidine HLD - continue Zocor DM2 - continue metformin   Addendum: Discussed case again with Dr. Learta Codding  from oncology. 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 He will discuss with [facility], patient may be potentially transferred over there.  Diet: carb Fluids: none DVT Prophylaxis: SCDs  Code Status: Full  Family Communication: none  Disposition Plan: obs  Time spent: 32  This note has been created with Surveyor, quantity. Any transcriptional errors are unintentional.   Costin M. Cruzita Lederer, MD [facility] Pager [phone]  If 7PM-7AM, please contact night-coverage [email or web address] Password [facility] 02/18/2014, 8:45 AM

## 2014-02-18 NOTE — ED Notes (Signed)
I have just given report to Diane, RN on [address] and will transport shortly.

## 2014-02-19 ENCOUNTER — Inpatient Hospital Stay (HOSPITAL_COMMUNITY): Payer: Medicare Other

## 2014-02-19 DIAGNOSIS — I10 Essential (primary) hypertension: Secondary | ICD-10-CM

## 2014-02-19 DIAGNOSIS — IMO0002 Reserved for concepts with insufficient information to code with codable children: Secondary | ICD-10-CM | POA: Diagnosis not present

## 2014-02-19 DIAGNOSIS — E785 Hyperlipidemia, unspecified: Secondary | ICD-10-CM | POA: Diagnosis present

## 2014-02-19 DIAGNOSIS — M25039 Hemarthrosis, unspecified wrist: Secondary | ICD-10-CM | POA: Diagnosis present

## 2014-02-19 DIAGNOSIS — D62 Acute posthemorrhagic anemia: Secondary | ICD-10-CM | POA: Diagnosis present

## 2014-02-19 DIAGNOSIS — M629 Disorder of muscle, unspecified: Secondary | ICD-10-CM | POA: Diagnosis present

## 2014-02-19 DIAGNOSIS — Z86010 Personal history of colonic polyps: Secondary | ICD-10-CM | POA: Diagnosis not present

## 2014-02-19 DIAGNOSIS — N400 Enlarged prostate without lower urinary tract symptoms: Secondary | ICD-10-CM

## 2014-02-19 DIAGNOSIS — M79609 Pain in unspecified limb: Secondary | ICD-10-CM | POA: Diagnosis present

## 2014-02-19 DIAGNOSIS — J449 Chronic obstructive pulmonary disease, unspecified: Secondary | ICD-10-CM | POA: Diagnosis present

## 2014-02-19 DIAGNOSIS — K219 Gastro-esophageal reflux disease without esophagitis: Secondary | ICD-10-CM | POA: Diagnosis present

## 2014-02-19 DIAGNOSIS — D500 Iron deficiency anemia secondary to blood loss (chronic): Secondary | ICD-10-CM

## 2014-02-19 DIAGNOSIS — Z833 Family history of diabetes mellitus: Secondary | ICD-10-CM | POA: Diagnosis not present

## 2014-02-19 DIAGNOSIS — Z825 Family history of asthma and other chronic lower respiratory diseases: Secondary | ICD-10-CM | POA: Diagnosis not present

## 2014-02-19 DIAGNOSIS — R58 Hemorrhage, not elsewhere classified: Secondary | ICD-10-CM

## 2014-02-19 DIAGNOSIS — D689 Coagulation defect, unspecified: Secondary | ICD-10-CM | POA: Diagnosis present

## 2014-02-19 DIAGNOSIS — B370 Candidal stomatitis: Secondary | ICD-10-CM | POA: Diagnosis not present

## 2014-02-19 DIAGNOSIS — I2510 Atherosclerotic heart disease of native coronary artery without angina pectoris: Secondary | ICD-10-CM | POA: Diagnosis present

## 2014-02-19 DIAGNOSIS — T148XXA Other injury of unspecified body region, initial encounter: Secondary | ICD-10-CM

## 2014-02-19 DIAGNOSIS — E119 Type 2 diabetes mellitus without complications: Secondary | ICD-10-CM | POA: Diagnosis present

## 2014-02-19 DIAGNOSIS — D68318 Other hemorrhagic disorder due to intrinsic circulating anticoagulants, antibodies, or inhibitors: Secondary | ICD-10-CM | POA: Diagnosis present

## 2014-02-19 DIAGNOSIS — M25073 Hemarthrosis, unspecified ankle: Secondary | ICD-10-CM | POA: Diagnosis present

## 2014-02-19 DIAGNOSIS — R233 Spontaneous ecchymoses: Secondary | ICD-10-CM | POA: Diagnosis present

## 2014-02-19 DIAGNOSIS — M25076 Hemarthrosis, unspecified foot: Secondary | ICD-10-CM | POA: Diagnosis present

## 2014-02-19 DIAGNOSIS — T380X5A Adverse effect of glucocorticoids and synthetic analogues, initial encounter: Secondary | ICD-10-CM | POA: Diagnosis present

## 2014-02-19 DIAGNOSIS — B9789 Other viral agents as the cause of diseases classified elsewhere: Secondary | ICD-10-CM | POA: Diagnosis present

## 2014-02-19 DIAGNOSIS — Z7982 Long term (current) use of aspirin: Secondary | ICD-10-CM | POA: Diagnosis not present

## 2014-02-19 DIAGNOSIS — M2420 Disorder of ligament, unspecified site: Secondary | ICD-10-CM | POA: Diagnosis present

## 2014-02-19 DIAGNOSIS — F17200 Nicotine dependence, unspecified, uncomplicated: Secondary | ICD-10-CM | POA: Diagnosis present

## 2014-02-19 LAB — CBC
HCT: 32 % — ABNORMAL LOW (ref 39.0–52.0)
Hemoglobin: 10.6 g/dL — ABNORMAL LOW (ref 13.0–17.0)
MCH: 28.5 pg (ref 26.0–34.0)
MCHC: 33.1 g/dL (ref 30.0–36.0)
MCV: 86 fL (ref 78.0–100.0)
PLATELETS: 245 10*3/uL (ref 150–400)
RBC: 3.72 MIL/uL — ABNORMAL LOW (ref 4.22–5.81)
RDW: 13.7 % (ref 11.5–15.5)
WBC: 7.3 10*3/uL (ref 4.0–10.5)

## 2014-02-19 LAB — PTT FACTOR INHIBITOR (MIXING STUDY)
1 Hr Incub PT 1:1NP: 41 seconds
PTT: 52 s — AB (ref <30)
Patient post incubation: 58 seconds
Patient-1/1, Incubated Mix-PTT: 57 seconds
Post inc 1:1 mix, npp + pt: 43 seconds

## 2014-02-19 LAB — PROTIME-INR

## 2014-02-19 LAB — HEPATITIS B CORE ANTIBODY, TOTAL: Hep B Core Total Ab: NONREACTIVE

## 2014-02-19 LAB — APTT: APTT: 76 s — AB (ref 24–37)

## 2014-02-19 LAB — RHEUMATOID FACTOR: Rhuematoid fact SerPl-aCnc: 10 IU/mL (ref <=14)

## 2014-02-19 LAB — GLUCOSE, CAPILLARY
GLUCOSE-CAPILLARY: 185 mg/dL — AB (ref 70–99)
GLUCOSE-CAPILLARY: 175 mg/dL — AB (ref 70–99)
Glucose-Capillary: 137 mg/dL — ABNORMAL HIGH (ref 70–99)

## 2014-02-19 LAB — HEPATITIS C ANTIBODY: HCV Ab: NEGATIVE

## 2014-02-19 LAB — HEPATITIS B SURFACE ANTIBODY,QUALITATIVE: Hep B S Ab: NEGATIVE

## 2014-02-19 MED ORDER — INSULIN ASPART 100 UNIT/ML ~~LOC~~ SOLN: 0.0000 [IU] | Freq: Three times a day (TID) | SUBCUTANEOUS | Status: DC

## 2014-02-19 MED ORDER — INSULIN ASPART 100 UNIT/ML ~~LOC~~ SOLN: 0.0000 [IU] | Freq: Every day | SUBCUTANEOUS | Status: DC

## 2014-02-19 MED ORDER — CLONIDINE HCL 0.1 MG PO TABS
0.1000 mg | ORAL_TABLET | Freq: Three times a day (TID) | ORAL | Status: DC
  Administered 2014-02-19 – 2014-02-21 (×5): 0.1 mg via ORAL
  Filled 2014-02-19 (×7): qty 1

## 2014-02-19 NOTE — Progress Notes (Signed)
IP PROGRESS NOTE  Subjective:   No apparent side effect from the factor VIIa.  The left ankle feels better.No new complaint. Rt. Arm feels better.  Objective: Vital signs in last 24 hours: Blood pressure 172/95, pulse 67, temperature 97.7 F (36.5 C), temperature source Oral, resp. rate 18, height 6\' 4"  (1.93 m), weight 223 lb 9.6 oz (101.424 kg), SpO2 95.00%.  Intake/Output from previous day: 02/28 0701 - 03/01 0700 In: 600 [P.O.:600] Out: 825 [Urine:825]  Physical Exam:  HEENT: no bleeding Lungs: scattered wheeze, distant breath sounds, no resp. distress Cardiac: rrr Abdomen: soft, nontender, no hsm Extremities: mild edema at the left anlke and left lower leg Skin:  ecchymoses over the rt. Arm, rt. Thigh , and rt. Shoulder appear stable.  Decreased swelling and ecchymosis at the left lower leg/ankle  Lab Results:  Recent Labs  02/18/14 0500 02/19/14 0553  WBC 11.0* 7.3  HGB 11.9* 10.6*  HCT 36.7* 32.0*  PLT 242 245   APTT 76 BMET  Recent Labs  02/18/14 0500  NA 136*  K 4.5  CL 100  CO2 21  GLUCOSE 117*  BUN 27*  CREATININE 1.32  CALCIUM 9.2    Studies/Results: No results found.  Medications: I have reviewed the patient's current medications.  Assessment/Plan:  1. 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 Bleeding secondary to #1, appears improved today 3. Anemia secondary to bleeding 4. Diabetes 5. HTN 6. COPD 7. BPH  The bleeding appears improved today. The plan is to complete treatment with Rituximab if the factor inhibitor assay returns positive. I discussed the potential toxicities associated with rituximab including hematologic toxicity, leucoencephalitis, reactiviation of hepatitis, and an allergic reaction. He agrees to proceed.  Recommend: 1. Continue prednisone. 2. Diabetes management per medicine 3.Rituximab when factor inhibitor assay returns 4. Serologic workup for RA, SLE   LOS: 1 day   [place]  02/19/2014, 10:10 AM

## 2014-02-19 NOTE — Progress Notes (Addendum)
[facility] PROGRESS NOTE  Richard Hopkins ZOX:[id number] DOB: 01-21-1946 DOA: 02/18/2014 PCP: Richard Cower, MD  Assessment/Plan  Factor VIII inhibitor - improvement in bruising and pain and swelling after initiation of steroids.  Etiology could be secondary to autoimmune process, malignancy, recent doxycycline.  Patient is smoker.   -  Appreciate oncology assistance -  ANA, RF ordered this AM -  CXR to eval for pulm mass or nodule (consider chest CT) -  PSA -  Immunosuppression per Onc  COPD, stabl, continue symbicort and prn duoneb  T2DM, now on high dose steroids and CBG rising -  D/c metformin -  Start mod dose SSI  HTN, elevated BP, likely due to steroids and pain -  Increase clonidine to TID -  Control pain  Normocytic anemia likely due to blood loss from hemarthrosis/muscular hemorrhage   Diet:  diabetic Access:  PIV IVF:  off Proph:  Bleeding disorder with active bleeding  Code Status: full Family Communication: patient alone Disposition Plan: pending improvement in pain and plan for tx of factor VIII deficiency   Consultants:  Oncology  Procedures:  duplex  Antibiotics:  none   HPI/Subjective:  Swelling and bruising have improved.  Less pain in right leg and left ankle    Objective: Filed Vitals:   02/18/14 2116 02/19/14 0530 02/19/14 0919 02/19/14 1357  BP: 142/88 172/95  171/91  Pulse: 81 67  85  Temp: 97.8 F (36.6 C) 97.7 F (36.5 C)  97.9 F (36.6 C)  TempSrc: Oral Oral  Oral  Resp: 16 18  20   Height:      Weight:      SpO2: 95% 96% 95% 94%    Intake/Output Summary (Last 24 hours) at 02/19/14 1955 Last data filed at 02/19/14 1838  Gross per 24 hour  Intake    700 ml  Output   1700 ml  Net  -1000 ml   Filed Weights   02/18/14 1840  Weight: 101.424 kg (223 lb 9.6 oz)    Exam:   General:  BM, No acute distress  HEENT:  NCAT, MMM  Cardiovascular:  RRR, nl S1, S2 no mrg, 2+ pulses, warm extremities  Respiratory:   Diminished bilateral BS, no focal rales or rhonchi, no increased WOB  Abdomen:   NABS, soft, NT/ND  MSK:   Normal tone and bulk, swelling of right arm soft tissues, right leg and left ankle/foot  Neuro:  Grossly intact, no focal deficits  Skin:  Bruising of all four extremities, worse on right leg.  Small area of bruise extension superior right lateral thigh, otherwise stable.    Data Reviewed: Basic Metabolic Panel:  Recent Labs Lab 02/18/14 0500  NA 136*  K 4.5  CL 100  CO2 21  GLUCOSE 117*  BUN 27*  CREATININE 1.32  CALCIUM 9.2   Liver Function Tests:  Recent Labs Lab 02/18/14 0500  AST 20  ALT 14  ALKPHOS 62  BILITOT 0.6  PROT 7.1  ALBUMIN 3.3*   No results found for this basename: LIPASE, AMYLASE,  in the last 168 hours No results found for this basename: AMMONIA,  in the last 168 hours CBC:  Recent Labs Lab 02/18/14 0500 02/19/14 0553  WBC 11.0* 7.3  NEUTROABS 7.8*  --   HGB 11.9* 10.6*  HCT 36.7* 32.0*  MCV 86.6 86.0  PLT 242 245   Cardiac Enzymes: No results found for this basename: CKTOTAL, CKMB, CKMBINDEX, TROPONINI,  in the last 168 hours BNP (last  3 results) No results found for this basename: PROBNP,  in the last 8760 hours CBG:  Recent Labs Lab 02/18/14 1033 02/18/14 1659 02/19/14 1252 02/19/14 1831  GLUCAP 81 116* 137* 185*    No results found for this or any previous visit (from the past 240 hour(s)).   Studies: No results found.  Scheduled Meds: . budesonide-formoterol  2 puff Inhalation BID  . cloNIDine  0.1 mg Oral BID  . metFORMIN  500 mg Oral BID WC  . nicotine  14 mg Transdermal Daily  . pantoprazole  40 mg Oral Daily  . predniSONE  100 mg Oral Q breakfast  . simvastatin  10 mg Oral q1800   Continuous Infusions:   Principal Problem:   Coagulopathy Active Problems:   DIABETES MELLITUS, TYPE II   HYPERLIPIDEMIA   SMOKER   HYPERTENSION   COPD   Bruising    Time spent: 30 min    Richard Hopkins,  [facility] Pager [phone]. If 7PM-7AM, please contact night-coverage at [email or web address], password [facility] 02/19/2014, 7:55 PM  LOS: 1 day

## 2014-02-19 NOTE — Care Management Utilization Note (Signed)
UR complete    Hazyl Marseille,MSN,RN [phone] 

## 2014-02-20 DIAGNOSIS — E119 Type 2 diabetes mellitus without complications: Secondary | ICD-10-CM

## 2014-02-20 DIAGNOSIS — D68318 Other hemorrhagic disorder due to intrinsic circulating anticoagulants, antibodies, or inhibitors: Secondary | ICD-10-CM | POA: Insufficient documentation

## 2014-02-20 LAB — BASIC METABOLIC PANEL
BUN: 30 mg/dL — ABNORMAL HIGH (ref 6–23)
CALCIUM: 8.9 mg/dL (ref 8.4–10.5)
CO2: 24 mEq/L (ref 19–32)
CREATININE: 1.04 mg/dL (ref 0.50–1.35)
Chloride: 100 mEq/L (ref 96–112)
GFR calc non Af Amer: 71 mL/min — ABNORMAL LOW (ref >90)
GFR, EST AFRICAN AMERICAN: 82 mL/min — AB (ref >90)
Glucose, Bld: 126 mg/dL — ABNORMAL HIGH (ref 70–99)
Potassium: 4.3 mEq/L (ref 3.7–5.3)
Sodium: 137 mEq/L (ref 137–147)

## 2014-02-20 LAB — CBC
HCT: 30.1 % — ABNORMAL LOW (ref 39.0–52.0)
Hemoglobin: 9.6 g/dL — ABNORMAL LOW (ref 13.0–17.0)
MCH: 27.4 pg (ref 26.0–34.0)
MCHC: 31.9 g/dL (ref 30.0–36.0)
MCV: 86 fL (ref 78.0–100.0)
Platelets: 290 10*3/uL (ref 150–400)
RBC: 3.5 MIL/uL — ABNORMAL LOW (ref 4.22–5.81)
RDW: 13.7 % (ref 11.5–15.5)
WBC: 14.5 10*3/uL — ABNORMAL HIGH (ref 4.0–10.5)

## 2014-02-20 LAB — GLUCOSE, CAPILLARY
GLUCOSE-CAPILLARY: 117 mg/dL — AB (ref 70–99)
Glucose-Capillary: 101 mg/dL — ABNORMAL HIGH (ref 70–99)
Glucose-Capillary: 168 mg/dL — ABNORMAL HIGH (ref 70–99)

## 2014-02-20 LAB — ANA: Anti Nuclear Antibody(ANA): NEGATIVE

## 2014-02-20 LAB — APTT: APTT: 70 s — AB (ref 24–37)

## 2014-02-20 MED ORDER — FAMOTIDINE IN NACL 20-0.9 MG/50ML-% IV SOLN: 20.0000 mg | Freq: Once | INTRAVENOUS | Status: AC | PRN

## 2014-02-20 MED ORDER — EPINEPHRINE HCL 1 MG/ML IJ SOLN: 0.5000 mg | Freq: Once | INTRAMUSCULAR | Status: AC | PRN

## 2014-02-20 MED ORDER — EPINEPHRINE HCL 0.1 MG/ML IJ SOLN: 0.2500 mg | Freq: Once | INTRAMUSCULAR | Status: AC | PRN

## 2014-02-20 MED ORDER — SODIUM CHLORIDE 0.9 % IJ SOLN: 10.0000 mL | INTRAMUSCULAR | Status: DC | PRN

## 2014-02-20 MED ORDER — HEPARIN SOD (PORK) LOCK FLUSH 100 UNIT/ML IV SOLN: 500.0000 [IU] | Freq: Once | INTRAVENOUS | Status: AC | PRN

## 2014-02-20 MED ORDER — METHYLPREDNISOLONE SODIUM SUCC 125 MG IJ SOLR
125.0000 mg | Freq: Once | INTRAMUSCULAR | Status: AC | PRN
  Filled 2014-02-20: qty 2

## 2014-02-20 MED ORDER — ALTEPLASE 2 MG IJ SOLR: 2.0000 mg | Freq: Once | INTRAMUSCULAR | Status: AC | PRN

## 2014-02-20 MED ORDER — SODIUM CHLORIDE 0.9 % IJ SOLN: 3.0000 mL | INTRAMUSCULAR | Status: DC | PRN

## 2014-02-20 MED ORDER — SODIUM CHLORIDE 0.9 % IV SOLN
Freq: Once | INTRAVENOUS | Status: AC
  Administered 2014-02-20: 13:00:00 via INTRAVENOUS

## 2014-02-20 MED ORDER — DIPHENHYDRAMINE HCL 50 MG/ML IJ SOLN: 25.0000 mg | Freq: Once | INTRAMUSCULAR | Status: AC | PRN

## 2014-02-20 MED ORDER — DIPHENHYDRAMINE HCL 50 MG/ML IJ SOLN: 50.0000 mg | Freq: Once | INTRAMUSCULAR | Status: AC | PRN

## 2014-02-20 MED ORDER — OXYCODONE HCL 5 MG PO TABS
5.0000 mg | ORAL_TABLET | ORAL | Status: DC | PRN
  Administered 2014-02-20 – 2014-02-21 (×4): 10 mg via ORAL
  Filled 2014-02-20 (×4): qty 2

## 2014-02-20 MED ORDER — HEPARIN SOD (PORK) LOCK FLUSH 100 UNIT/ML IV SOLN: 250.0000 [IU] | Freq: Once | INTRAVENOUS | Status: AC | PRN

## 2014-02-20 MED ORDER — ACETAMINOPHEN 325 MG PO TABS
650.0000 mg | ORAL_TABLET | Freq: Once | ORAL | Status: AC
  Administered 2014-02-20: 650 mg via ORAL
  Filled 2014-02-20: qty 2

## 2014-02-20 MED ORDER — DIPHENHYDRAMINE HCL 50 MG PO CAPS
50.0000 mg | ORAL_CAPSULE | Freq: Once | ORAL | Status: AC
  Administered 2014-02-20: 50 mg via ORAL
  Filled 2014-02-20: qty 1

## 2014-02-20 MED ORDER — INSULIN ASPART 100 UNIT/ML ~~LOC~~ SOLN: 0.0000 [IU] | Freq: Three times a day (TID) | SUBCUTANEOUS | Status: DC

## 2014-02-20 MED ORDER — SODIUM CHLORIDE 0.9 % IV SOLN
375.0000 mg/m2 | Freq: Once | INTRAVENOUS | Status: AC
  Administered 2014-02-20: 900 mg via INTRAVENOUS
  Filled 2014-02-20: qty 90

## 2014-02-20 MED ORDER — SODIUM CHLORIDE 0.9 % IV SOLN: Freq: Once | INTRAVENOUS | Status: AC | PRN

## 2014-02-20 MED ORDER — ALBUTEROL SULFATE (2.5 MG/3ML) 0.083% IN NEBU: 2.5000 mg | INHALATION_SOLUTION | Freq: Once | RESPIRATORY_TRACT | Status: AC | PRN

## 2014-02-20 NOTE — Progress Notes (Signed)
Patient transferred to 1306. Patient assessment has not changed. Patient is stable at transfer. Report given to [employer].

## 2014-02-20 NOTE — Progress Notes (Signed)
IP PROGRESS NOTE  Subjective:   No new area of bleeding. He continues to have pain in the right arm and left lower leg/ankle.  Objective: Vital signs in last 24 hours: Blood pressure 153/89, pulse 80, temperature 97.5 F (36.4 C), temperature source Oral, resp. rate 18, height 6\' 4"  (1.93 m), weight 223 lb 9.6 oz (101.424 kg), SpO2 97.00%.  Intake/Output from previous day: 03/01 0701 - 03/02 0700 In: 700 [P.O.:700] Out: 1650 [Urine:1650]  Physical Exam:  HEENT: no bleeding Lungs: scattered wheeze, distant breath sounds, no resp. distress Cardiac: rrr Extremities: mild edema at the left anlke and left lower leg Skin: Stable ecchymoses over the rt. Arm, rt. Thigh , and rt. Shoulder .  Decreased swelling with a stable ecchymosis at the left lower leg/ankle  Lab Results:  Recent Labs  02/19/14 0553 02/20/14 0735  WBC 7.3 14.5*  HGB 10.6* 9.6*  HCT 32.0* 30.1*  PLT 245 290   APTT 76 BMET  Recent Labs  02/18/14 0500 02/20/14 0735  NA 136* 137  K 4.5 4.3  CL 100 100  CO2 21 24  GLUCOSE 117* 126*  BUN 27* 30*  CREATININE 1.32 1.04  CALCIUM 9.2 8.9    Studies/Results: Dg Chest Port 1 View  02/19/2014   CLINICAL DATA:  Evaluate for pulmonary nodules.  Cough.  EXAM: PORTABLE CHEST - 1 VIEW  COMPARISON:  03/01/2013  FINDINGS: There is bilateral irregular interstitial thickening that is most evident peripherally in the right lower lung zone. This is stable consistent with fibrosis. No areas of lung consolidation. No discrete lung nodules. No pulmonary edema. No pleural effusion or pneumothorax elevation of the left hemidiaphragm is stable.  Cardiac silhouette is normal in size. Aorta is mildly uncoiled. No mediastinal or hilar masses.  Bony thorax is demineralized but grossly intact.  IMPRESSION: No acute cardiopulmonary disease.   Electronically Signed   By: Lajean Manes M.D.   On: 02/19/2014 20:24    Medications: I have reviewed the patient's current  medications.  Assessment/Plan:  1. 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 Bleeding secondary to #1, appears improved today 3. Anemia secondary to bleeding and poly-phlebotomy 4. Diabetes 5. HTN 6. COPD 7. BPH  The PTT mixing study has returned consistent with an inhibitor. The ecchymoses over the extremities appear unchanged. The plan is to begin rituximab today. He agrees to proceed.  Recommend: 1. Continue prednisone. 2. Diabetes management per medicine 3.Rituximab today  He may be ready for discharge over the next one to 2 days if the ecchymoses remained stable.   LOS: 2 days   Glenn Christo, Dominica Severin  02/20/2014, 8:46 AM

## 2014-02-20 NOTE — Progress Notes (Signed)
[facility] PROGRESS NOTE  Richard Hopkins:[id number] DOB: June 06, 1946 DOA: 02/18/2014 PCP: Cathlean Cower, MD  Assessment/Plan  Factor VIII inhibitor - improvement in bruising and pain and swelling after initiation of steroids.  Etiology could be secondary to autoimmune process, malignancy, recent doxycycline.  Patient is smoker.   -  Appreciate oncology assistance -  ANA neg, RF neg -  CXR:  Neg for pulm nodules and masses -  PSA pending -  PTT factor inhibitor presente -  Management per Onc -  Oxycodone dose increased today by Dr. Benay Spice -  Pt received factor VIIa (NovoSeven) 83mcg/kg on 2/28, no evidence of thrombosis to date -  Pred 80mg  2/28 followed by 100mg  thereafter -  Rituximab ordered for administration 3/2  COPD, stabl, continue symbicort and prn duoneb  T2DM, now on high dose steroids and CBG lower today -  Continue to hold metformin -  Decrease to low dose SSI  HTN, elevated BP, likely due to steroids and pain -  Increase clonidine to TID -  Control pain  Normocytic anemia likely due to blood loss from hemarthrosis/muscular hemorrhage   Diet:  diabetic Access:  PIV IVF:  off Proph:  Bleeding disorder with active bleeding  Code Status: full Family Communication: patient alone Disposition Plan: pending improvement in pain and plan for tx of factor VIII deficiency   Consultants:  Oncology  Procedures:  duplex  Antibiotics:  none   HPI/Subjective:  Swelling and bruising have improved.  Less pain in right leg and left ankle    Objective: Filed Vitals:   02/20/14 0535 02/20/14 0932 02/20/14 1011 02/20/14 1241  BP: 153/89  164/95 164/88  Pulse: 80  78 67  Temp: 97.5 F (36.4 C)   97.7 F (36.5 C)  TempSrc: Oral   Oral  Resp: 18   16  Height:    6\' 4"  (1.93 m)  Weight:    101.606 kg (224 lb)  SpO2: 97% 99%  100%    Intake/Output Summary (Last 24 hours) at 02/20/14 1445 Last data filed at 02/20/14 1003  Gross per 24 hour  Intake     600 ml  Output   1050 ml  Net   -450 ml   Filed Weights   02/18/14 1840 02/20/14 1241  Weight: 101.424 kg (223 lb 9.6 oz) 101.606 kg (224 lb)    Exam:   General:  BM, No acute distress  HEENT:  NCAT, MMM  Cardiovascular:  RRR, nl S1, S2 no mrg, 2+ pulses, warm extremities  Respiratory:  Diminished bilateral BS, no focal rales or rhonchi, no increased WOB  Abdomen:   NABS, soft, NT/ND  MSK:   Normal tone and bulk, swelling of right arm soft tissues, right leg and left ankle/foot  Neuro:  Grossly intact, no focal deficits  Skin:  Bruising of all four extremities, worse on right leg.  Small area of bruise extension superior right lateral thigh, otherwise stable.    Data Reviewed:  PTT 52 (H) <30 seconds Final Performed at [place] [facility]  1 Hr Incub PT 1:1NP 41 seconds Final  Patient post incubation 58 seconds Final  Patient-1/1, Incubated Mix-PTT 57 seconds Final  Post inc 1:1 mix, npp + pt 43 seconds Final   Interpretation-PTT Mixing Study CONSISTENT WITH INHIBITOR Final ANTICOAGULANT EFFECTS CANNOT BE RULED OUT  Factor VIII 1% activity, (50-200%)  Hep C ab neg Hep BsAg:  Neg HepBcAb:  neg  Basic Metabolic Panel:  Recent Labs Lab  02/18/14 0500 02/20/14 0735  NA 136* 137  K 4.5 4.3  CL 100 100  CO2 21 24  GLUCOSE 117* 126*  BUN 27* 30*  CREATININE 1.32 1.04  CALCIUM 9.2 8.9   Liver Function Tests:  Recent Labs Lab 02/18/14 0500  AST 20  ALT 14  ALKPHOS 62  BILITOT 0.6  PROT 7.1  ALBUMIN 3.3*   No results found for this basename: LIPASE, AMYLASE,  in the last 168 hours No results found for this basename: AMMONIA,  in the last 168 hours CBC:  Recent Labs Lab 02/18/14 0500 02/19/14 0553 02/20/14 0735  WBC 11.0* 7.3 14.5*  NEUTROABS 7.8*  --   --   HGB 11.9* 10.6* 9.6*  HCT 36.7* 32.0* 30.1*  MCV 86.6 86.0 86.0  PLT 242 245 290   Cardiac Enzymes: No results found for this basename: CKTOTAL, CKMB, CKMBINDEX, TROPONINI,  in  the last 168 hours BNP (last 3 results) No results found for this basename: PROBNP,  in the last 8760 hours CBG:  Recent Labs Lab 02/19/14 1252 02/19/14 1831 02/19/14 2139 02/20/14 0742 02/20/14 1149  GLUCAP 137* 185* 175* 117* 101*    No results found for this or any previous visit (from the past 240 hour(s)).   Studies: Dg Chest Port 1 View  02/19/2014   CLINICAL DATA:  Evaluate for pulmonary nodules.  Cough.  EXAM: PORTABLE CHEST - 1 VIEW  COMPARISON:  03/01/2013  FINDINGS: There is bilateral irregular interstitial thickening that is most evident peripherally in the right lower lung zone. This is stable consistent with fibrosis. No areas of lung consolidation. No discrete lung nodules. No pulmonary edema. No pleural effusion or pneumothorax elevation of the left hemidiaphragm is stable.  Cardiac silhouette is normal in size. Aorta is mildly uncoiled. No mediastinal or hilar masses.  Bony thorax is demineralized but grossly intact.  IMPRESSION: No acute cardiopulmonary disease.   Electronically Signed   By: Lajean Manes M.D.   On: 02/19/2014 20:24    Scheduled Meds: . acetaminophen  650 mg Oral Once  . budesonide-formoterol  2 puff Inhalation BID  . cloNIDine  0.1 mg Oral TID  . diphenhydrAMINE  50 mg Oral Once  . insulin aspart  0-15 Units Subcutaneous TID WC  . insulin aspart  0-5 Units Subcutaneous QHS  . nicotine  14 mg Transdermal Daily  . pantoprazole  40 mg Oral Daily  . predniSONE  100 mg Oral Q breakfast  . riTUXimab (RITUXAN) IV infusion  375 mg/m2 (Treatment Plan Actual) Intravenous Once  . simvastatin  10 mg Oral q1800   Continuous Infusions:   Principal Problem:   Coagulopathy Active Problems:   DIABETES MELLITUS, TYPE II   HYPERLIPIDEMIA   SMOKER   HYPERTENSION   COPD   Bruising    Time spent: 30 min    Levorn Oleski, [place] [facility] Pager [phone]. If 7PM-7AM, please contact night-coverage at [email or web address], password [facility] 02/20/2014, 2:45 PM   LOS: 2 days

## 2014-02-20 NOTE — Progress Notes (Signed)
D: Patient tolerating Rituxan at 50 mg/Hr. A: T: 98.1 BP 157/92, R 20 Pulse 75, O2 sat 98% RA. R: Rituxan increased to 100 mg/hr.

## 2014-02-20 NOTE — Progress Notes (Signed)
D: Patient for Rituxan at 1600 today per Dr Bernette Redbird' orders for Factor VIII deficiency. A: Blood return from 20 ga iv at [facility] dated 3/28. Benadryl and Tylenol given at 1500. Rituxan started at 1445, at 50 mg/ hr. R: Patient tolerating Rituxan, VS checked at 30-min:Temp 98.1, 75 pulse, BP:150/90, 100% on RA.

## 2014-02-21 ENCOUNTER — Telehealth: Payer: Self-pay | Admitting: Oncology

## 2014-02-21 ENCOUNTER — Other Ambulatory Visit: Payer: Self-pay | Admitting: Oncology

## 2014-02-21 ENCOUNTER — Other Ambulatory Visit: Payer: Self-pay | Admitting: *Deleted

## 2014-02-21 ENCOUNTER — Telehealth: Payer: Self-pay | Admitting: Nurse Practitioner

## 2014-02-21 DIAGNOSIS — D689 Coagulation defect, unspecified: Secondary | ICD-10-CM

## 2014-02-21 DIAGNOSIS — B370 Candidal stomatitis: Secondary | ICD-10-CM

## 2014-02-21 DIAGNOSIS — D72829 Elevated white blood cell count, unspecified: Secondary | ICD-10-CM

## 2014-02-21 DIAGNOSIS — D62 Acute posthemorrhagic anemia: Secondary | ICD-10-CM

## 2014-02-21 LAB — BASIC METABOLIC PANEL
BUN: 34 mg/dL — ABNORMAL HIGH (ref 6–23)
CO2: 23 mEq/L (ref 19–32)
CREATININE: 1.08 mg/dL (ref 0.50–1.35)
Calcium: 8.9 mg/dL (ref 8.4–10.5)
Chloride: 100 mEq/L (ref 96–112)
GFR calc Af Amer: 78 mL/min — ABNORMAL LOW (ref >90)
GFR calc non Af Amer: 68 mL/min — ABNORMAL LOW (ref >90)
GLUCOSE: 152 mg/dL — AB (ref 70–99)
Potassium: 4.4 mEq/L (ref 3.7–5.3)
SODIUM: 137 meq/L (ref 137–147)

## 2014-02-21 LAB — GLUCOSE, CAPILLARY
Glucose-Capillary: 106 mg/dL — ABNORMAL HIGH (ref 70–99)
Glucose-Capillary: 116 mg/dL — ABNORMAL HIGH (ref 70–99)

## 2014-02-21 LAB — CBC
HCT: 28.4 % — ABNORMAL LOW (ref 39.0–52.0)
Hemoglobin: 9.4 g/dL — ABNORMAL LOW (ref 13.0–17.0)
MCH: 28.4 pg (ref 26.0–34.0)
MCHC: 33.1 g/dL (ref 30.0–36.0)
MCV: 85.8 fL (ref 78.0–100.0)
PLATELETS: 309 10*3/uL (ref 150–400)
RBC: 3.31 MIL/uL — AB (ref 4.22–5.81)
RDW: 13.7 % (ref 11.5–15.5)
WBC: 13.4 10*3/uL — ABNORMAL HIGH (ref 4.0–10.5)

## 2014-02-21 LAB — APTT: aPTT: 56 seconds — ABNORMAL HIGH (ref 24–37)

## 2014-02-21 MED ORDER — CLONIDINE HCL 0.1 MG PO TABS: 0.1000 mg | ORAL_TABLET | Freq: Three times a day (TID) | ORAL | Status: DC

## 2014-02-21 MED ORDER — PREDNISONE 50 MG PO TABS
80.0000 mg | ORAL_TABLET | Freq: Every day | ORAL | Status: DC
  Filled 2014-02-21: qty 1

## 2014-02-21 MED ORDER — OXYCODONE HCL 5 MG PO TABS: 5.0000 mg | ORAL_TABLET | ORAL | Status: DC | PRN

## 2014-02-21 MED ORDER — PREDNISONE 20 MG PO TABS: 80.0000 mg | ORAL_TABLET | Freq: Every day | ORAL | Status: DC

## 2014-02-21 NOTE — Telephone Encounter (Signed)
C/D 02/21/14 for appt. 02/27/14 °

## 2014-02-21 NOTE — Discharge Summary (Addendum)
Physician Discharge Summary  Richard Hopkins [id number] DOB: [birth year] DOA: 02/18/2014  PCP: Cathlean Cower, MD  Admit date: 02/18/2014 Discharge date: 02/21/2014  Recommendations for Outpatient Follow-up:  1. 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  Consider PCP prophylaxis if he needs to stay on immunosuppression for greater than 1 month.  F/u PSA.   2. PCP in 1 week for blood pressure check and diabetes management.  Referral to orthopedics surgery if joint swelling persists.    Discharge Diagnoses:  Principal Problem:   Factor VIII inhibitor disorder Active Problems:   DIABETES MELLITUS, TYPE II   HYPERLIPIDEMIA   SMOKER   HYPERTENSION   COPD   Bruising   Acute blood loss anemia   Leukocytosis, unspecified   Discharge Condition: stable, improved  Diet recommendation: diabetic diet  Wt Readings from Last 3 Encounters:  02/20/14 101.606 kg (224 lb)  02/09/14 103.137 kg (227 lb 6 oz)  11/10/13 102.059 kg (225 lb)    History of present illness:  Richard Hopkins is a 68 y.o. male has a past medical history significant for COPD, asthma, HTN, HLD, CAD, tobacco abuse, presents to the ED with bruising on his right thigh, right arm, left ankle. He initially saw a small area of bruising on his left thigh and arm, presented to his PCP who started him on an antibiotic for presumed cellulitis, without improvement. His bruising has gotten worse and it was so painful that he decided to present to the ED. Blood work show isolated elevated aPTT with normal INR and Prothrombin time. His blood work is otherwise normal including normal platelets and Hb of 11.9. Patient denies fever/chills, denies SOB, chest pain, has no lightheadedness or dizziness. Has no abdominal pain, nausea/vomiting or diarrhea.   Hospital Course:   Factor VIII inhibitor:  Richard Hopkins presented with extensive bruising  and possible hemarthrosis that occurred over a few days.  His initial labs demonstrated normal platelet count and INR, but very elevated PTT at 83.  Testing discerned a factor VIII deficiency with only 1% activity and subsequent tests revealed the presence of an inhibitor.  Etiology could be secondary to autoimmune process, malignancy, recent doxycycline.  ANA neg, RF neg.  PSA is pending.  CXR negative for nodules or masses.  He was given factor VIIa (NovoSeven) 25mcg/kg on 2/28 due to severity of his bleeding, which he tolerated well, no evidence of thrombosis.  He also received prednisone approximately 1mg /kg/day and was administered the first dose of rituximab on 3/2.  His pain was controlled with oxycodone and his bleeding slowed or stopped.  He will follow up with oncology in a few days for examination and for ongoing rituximab infusions.    Hep B SAb neg Hep B CAb neg HCV ab neg  COPD, stabl, continued symbicort and prn duoneb.  T2DM, CBG well controlled.  Metformin held and given SSI and CBGs remained stable.  Recommend continuing metformin and checking CBG at home if able.  PCP to check CBG and repeat A1c.    HTN, elevated BP, likely due to steroids and pain.  His clonidine was increased to TID and his BP trended down to 130s/80s.    Normocytic anemia likely due to acute blood loss from hemarthrosis/muscular hemorrhage.  F/u per hematology/oncology  Leukocytosis due to possible viral illness and stress.  CXR neg for infiltrate.  No symptoms of UTI.  Repeat CBC as outpatient.  Consultants:  Oncology Procedures:  Duplex neg for DVT Antibiotics:  none    Discharge Exam: Filed Vitals:   02/21/14 1325  BP: 144/90  Pulse: 94  Temp: 97.6 F (36.4 C)  Resp: 18   Filed Vitals:   02/20/14 2210 02/21/14 0014 02/21/14 0400 02/21/14 1325  BP: 134/72 139/80 142/76 144/90  Pulse: 77 74 79 94  Temp: 97.9 F (36.6 C) 98.1 F (36.7 C) 98.2 F (36.8 C) 97.6 F (36.4 C)  TempSrc:  Oral Oral Oral Oral  Resp: 16 18 16 18   Height:      Weight:      SpO2: 99% 99% 99% 96%    General: BM, No acute distress  HEENT: NCAT, MMM  Cardiovascular: RRR, nl S1, S2 no mrg, 2+ pulses, warm extremities  Respiratory: Diminished bilateral BS with some wheeze, no focal rales or rhonchi, no increased WOB  Abdomen: NABS, soft, NT/ND  MSK: Normal tone and bulk, swelling of right arm soft tissues, right leg and left ankle/foot  Neuro: Grossly intact, no focal deficits  Skin: Bruising of all four extremities, worse on right leg. Small area of bruise extension superior right lateral thigh, otherwise stable.    Discharge Instructions      Discharge Orders   Future Appointments Provider Department Dept Phone   02/27/2014 8:00 AM [facility] [phone]   02/27/2014 8:30 AM [facility] [phone]   02/27/2014 9:15 AM Owens Shark, NP [facility] [phone]   03/06/2014 8:15 AM [facility] [phone]   03/06/2014 8:45 AM Ladell Pier, MD [facility] [phone]   05/10/2014 3:30 PM Biagio Borg, MD [facility] [phone]   Future Orders Complete By Expires   Call MD for:  difficulty breathing, headache or visual disturbances  As directed    Call MD for:  extreme fatigue  As directed    Call MD for:  hives  As directed    Call MD for:  persistant dizziness or light-headedness  As directed    Call MD for:  persistant nausea and vomiting  As directed    Call MD for:  severe uncontrolled pain  As directed    Call MD for:  temperature >100.4  As directed    Diet Carb Modified  As directed    Discharge instructions  As directed    Comments:     You were hospitalized with bruising and bleeding which was due to Factor 8 deficiency.  Factor 8  normally helps you form scabs and blood clots and when this level is very low, it causes easy bruising and bleeding.  Your body's immune system is accidentally making a chemical that attacks and destroys Factor 8.  You have been given medications to suppress your immune system to prevent this from happening, including prednisone and rituximab.  You were also given a shot of clotting factor when you were first admitted to help stop the bleeding.  Please continue taking prednisone daily and follow up with your primary care doctor in a few days to have your blood pressure and diabetes rechecked.  Your blood pressure has been high, so I have increased your clonidine to three times per day.  Follow up with hematology/Oncology on Monday at your already scheduled appointment.  If you have uncontrolled bleeding or signs of stroke,  including numbness, weakness, slurred speech, confusion, or facial droop, please call 911.   Increase activity slowly  As directed        Medication List    STOP taking these medications       ADVIL PM 200-38 MG Tabs  Generic drug:  Ibuprofen-Diphenhydramine Cit     aspirin 81 MG EC tablet     aspirin EC 325 MG tablet     doxycycline 100 MG tablet  Commonly known as:  VIBRA-TABS      TAKE these medications       acetaminophen 500 MG tablet  Commonly known as:  TYLENOL  Take 1,000 mg by mouth every 6 (six) hours as needed for mild pain or headache.     budesonide-formoterol 160-4.5 MCG/ACT inhaler  Commonly known as:  SYMBICORT  Inhale 2 puffs into the lungs 2 (two) times daily.     cloNIDine 0.1 MG tablet  Commonly known as:  CATAPRES  Take 1 tablet (0.1 mg total) by mouth 3 (three) times daily.     ipratropium-albuterol 0.5-2.5 (3) MG/3ML Soln  Commonly known as:  DUONEB  Take 3 mLs by nebulization every 6 (six) hours as needed (wheezing/shortness of breath).     lovastatin 20 MG tablet  Commonly known as:  MEVACOR  Take 40 mg by mouth daily at 6 PM.      metFORMIN 500 MG tablet  Commonly known as:  GLUCOPHAGE  Take 1 tablet (500 mg total) by mouth 2 (two) times daily with a meal.     omeprazole 20 MG capsule  Commonly known as:  PRILOSEC  Take 20 mg by mouth every morning.     oxyCODONE 5 MG immediate release tablet  Commonly known as:  Oxy IR/ROXICODONE  Take 1-2 tablets (5-10 mg total) by mouth every 4 (four) hours as needed for severe pain.     predniSONE 20 MG tablet  Commonly known as:  DELTASONE  Take 4 tablets (80 mg total) by mouth daily with breakfast.     vardenafil 20 MG tablet  Commonly known as:  LEVITRA  Take 1 tablet (20 mg total) by mouth daily as needed.       Follow-up Information   Follow up with Cathlean Cower, MD. Schedule an appointment as soon as possible for a visit in 1 week.   Specialties:  Internal Medicine, Radiology   Contact information:   [place] [address] [phone]       Follow up with Betsy Coder, MD On 02/27/2014. Bertha Stakes with financial counselor)    Specialty:  Oncology   Contact information:   [place] [address] [phone]        The results of significant diagnostics from this hospitalization (including imaging, microbiology, ancillary and laboratory) are listed below for reference.    Significant Diagnostic Studies: Dg Chest Port 1 View  02/19/2014   CLINICAL DATA:  Evaluate for pulmonary nodules.  Cough.  EXAM: PORTABLE CHEST - 1 VIEW  COMPARISON:  03/01/2013  FINDINGS: There is bilateral irregular interstitial thickening that is most evident peripherally in the right lower lung zone. This is stable consistent with fibrosis. No areas of lung consolidation. No discrete lung nodules. No pulmonary edema. No pleural effusion or pneumothorax elevation of the left hemidiaphragm is stable.  Cardiac silhouette is normal in size. Aorta is mildly uncoiled. No mediastinal or hilar masses.  Bony thorax is demineralized but grossly intact.   IMPRESSION: No  acute cardiopulmonary disease.   Electronically Signed   By: Lajean Manes M.D.   On: 02/19/2014 20:24    Microbiology: No results found for this or any previous visit (from the past 240 hour(s)).   Labs:  PTT 52 (H) <30 seconds Final Performed at [facility]  1 Hr Incub PT 1:1NP 41 seconds Final  Patient post incubation 58 seconds Final  Patient-1/1, Incubated Mix-PTT 57 seconds Final  Post inc 1:1 mix, npp + pt 43 seconds Final  Interpretation-PTT Mixing Study CONSISTENT WITH INHIBITOR Final ANTICOAGULANT EFFECTS CANNOT BE RULED OUT  Factor VIII 1% activity, (50-200%)    Basic Metabolic Panel:  Recent Labs Lab 02/18/14 0500 02/20/14 0735 02/21/14 0410  NA 136* 137 137  K 4.5 4.3 4.4  CL 100 100 100  CO2 21 24 23   GLUCOSE 117* 126* 152*  BUN 27* 30* 34*  CREATININE 1.32 1.04 1.08  CALCIUM 9.2 8.9 8.9   Liver Function Tests:  Recent Labs Lab 02/18/14 0500  AST 20  ALT 14  ALKPHOS 62  BILITOT 0.6  PROT 7.1  ALBUMIN 3.3*   No results found for this basename: LIPASE, AMYLASE,  in the last 168 hours No results found for this basename: AMMONIA,  in the last 168 hours CBC:  Recent Labs Lab 02/18/14 0500 02/19/14 0553 02/20/14 0735 02/21/14 0410  WBC 11.0* 7.3 14.5* 13.4*  NEUTROABS 7.8*  --   --   --   HGB 11.9* 10.6* 9.6* 9.4*  HCT 36.7* 32.0* 30.1* 28.4*  MCV 86.6 86.0 86.0 85.8  PLT 242 245 290 309   Cardiac Enzymes: No results found for this basename: CKTOTAL, CKMB, CKMBINDEX, TROPONINI,  in the last 168 hours BNP: BNP (last 3 results) No results found for this basename: PROBNP,  in the last 8760 hours CBG:  Recent Labs Lab 02/20/14 0742 02/20/14 1149 02/20/14 2316 02/21/14 0726 02/21/14 1124  GLUCAP 117* 101* 168* 106* 116*    Time coordinating discharge: 45 minutes  Signed:  Liani Caris  [facility] 02/21/2014, 4:15 PM

## 2014-02-21 NOTE — Progress Notes (Signed)
IP PROGRESS NOTE  Subjective:   He reports no apparent side effects from the rituximab. No new bleeding. The right arm and bilateral leg pain have improved.  Objective: Vital signs in last 24 hours: Blood pressure 142/76, pulse 79, temperature 98.2 F (36.8 C), temperature source Oral, resp. rate 16, height 6\' 4"  (1.93 m), weight 224 lb (101.606 kg), SpO2 99.00%.  Intake/Output from previous day: 03/02 0701 - 03/03 0700 In: 1320 [P.O.:1320] Out: 1400 [Urine:1400]  Physical Exam:  HEENT: no bleeding, mild thrush at the pharynx Lungs: Distant breath sounds, no respiratory distress Cardiac: rrr Extremities: mild edema at the left anlke  Skin: Decreased discoloration associated with the ecchymoses over the rt. Arm, rt. Thigh , and rt. Shoulder .  Decreased swelling with a resolving ecchymosis at the left lower leg/ankle  Lab Results:  Recent Labs  02/20/14 0735 02/21/14 0410  WBC 14.5* 13.4*  HGB 9.6* 9.4*  HCT 30.1* 28.4*  PLT 290 309   APTT 56 BMET  Recent Labs  02/20/14 0735 02/21/14 0410  NA 137 137  K 4.3 4.4  CL 100 100  CO2 24 23  GLUCOSE 126* 152*  BUN 30* 34*  CREATININE 1.04 1.08  CALCIUM 8.9 8.9    Studies/Results: Dg Chest Port 1 View  02/19/2014   CLINICAL DATA:  Evaluate for pulmonary nodules.  Cough.  EXAM: PORTABLE CHEST - 1 VIEW  COMPARISON:  03/01/2013  FINDINGS: There is bilateral irregular interstitial thickening that is most evident peripherally in the right lower lung zone. This is stable consistent with fibrosis. No areas of lung consolidation. No discrete lung nodules. No pulmonary edema. No pleural effusion or pneumothorax elevation of the left hemidiaphragm is stable.  Cardiac silhouette is normal in size. Aorta is mildly uncoiled. No mediastinal or hilar masses.  Bony thorax is demineralized but grossly intact.  IMPRESSION: No acute cardiopulmonary disease.   Electronically Signed   By: Lajean Manes M.D.   On: 02/19/2014 20:24     Medications: I have reviewed the patient's current medications.  Assessment/Plan:  1. Factor 8 inhibitor, s/p factor VIIa  2/28 PM, rituximab 02/20/2014, on prednisone 2. Bleeding secondary to #1, appears to have resolved 3. Anemia secondary to bleeding and poly-phlebotomy-stable 4. Diabetes 5. HTN 6. COPD 7. BPH 8. Oral candidiasis secondary to steroids  He appears stable. The PTT has improved. He is stable for discharge from a hematology standpoint. He knows to call for recurrent bleeding.  Recommend: 1. Continue prednisone, taper to 80 mg per day at discharge 2. Diabetes management per medicine 3. we will arrange for an office visit and rituximab therapy on 02/27/2014.     LOS: 3 days   Rosaleah Person  02/21/2014, 8:32 AM

## 2014-02-21 NOTE — Telephone Encounter (Signed)
hosp f/u. per pt 3/3 pof pt needs tx 3/9, 3/16 amd 3/23. pt on schedule for hosp f/u appt 3/9. added 3/16 and 3/23 lb/tx. not able to add 3/9. message to chemo scheduler re adding 3/9. pt will get appts at 1st visit 3/9

## 2014-02-22 ENCOUNTER — Telehealth: Payer: Self-pay | Admitting: *Deleted

## 2014-02-22 LAB — PSA: PSA: 0.78 ng/mL (ref <=4.00)

## 2014-02-22 NOTE — Telephone Encounter (Signed)
Per staff message and POF I have scheduled appts.  JMW  

## 2014-02-23 ENCOUNTER — Telehealth: Payer: Self-pay | Admitting: Internal Medicine

## 2014-02-23 DIAGNOSIS — M79606 Pain in leg, unspecified: Secondary | ICD-10-CM

## 2014-02-23 NOTE — Telephone Encounter (Signed)
Pt request phone call from Dr. Jenny Reichmann assistant. Pt stated he just got out of the hospital and he was wondering if he can get referral to orthopedic doctor. Please call pt

## 2014-02-23 NOTE — Telephone Encounter (Signed)
Referral done

## 2014-02-23 NOTE — Telephone Encounter (Signed)
The patient has been in the hospital and stated due to problems with his legs was advise by Dr. Tessa Lerner to get a referral with an orthopedic.

## 2014-02-24 ENCOUNTER — Other Ambulatory Visit: Payer: Self-pay | Admitting: Oncology

## 2014-02-27 ENCOUNTER — Ambulatory Visit: Payer: Medicare Other

## 2014-02-27 ENCOUNTER — Ambulatory Visit (HOSPITAL_BASED_OUTPATIENT_CLINIC_OR_DEPARTMENT_OTHER): Payer: Medicare Other | Admitting: Nurse Practitioner

## 2014-02-27 ENCOUNTER — Encounter: Payer: Self-pay | Admitting: Oncology

## 2014-02-27 ENCOUNTER — Other Ambulatory Visit (HOSPITAL_BASED_OUTPATIENT_CLINIC_OR_DEPARTMENT_OTHER): Payer: Medicare Other

## 2014-02-27 ENCOUNTER — Ambulatory Visit (HOSPITAL_BASED_OUTPATIENT_CLINIC_OR_DEPARTMENT_OTHER): Payer: Medicare Other

## 2014-02-27 ENCOUNTER — Other Ambulatory Visit: Payer: Self-pay | Admitting: *Deleted

## 2014-02-27 VITALS — BP 155/75 | HR 78 | Temp 97.9°F | Resp 18 | Ht 76.0 in

## 2014-02-27 DIAGNOSIS — D68311 Acquired hemophilia: Secondary | ICD-10-CM

## 2014-02-27 DIAGNOSIS — D689 Coagulation defect, unspecified: Secondary | ICD-10-CM

## 2014-02-27 DIAGNOSIS — D68318 Other hemorrhagic disorder due to intrinsic circulating anticoagulants, antibodies, or inhibitors: Secondary | ICD-10-CM

## 2014-02-27 DIAGNOSIS — Z5112 Encounter for antineoplastic immunotherapy: Secondary | ICD-10-CM

## 2014-02-27 LAB — CBC WITH DIFFERENTIAL/PLATELET
BASO%: 0.3 % (ref 0.0–2.0)
Basophils Absolute: 0 10*3/uL (ref 0.0–0.1)
EOS ABS: 0.2 10*3/uL (ref 0.0–0.5)
EOS%: 2 % (ref 0.0–7.0)
HCT: 31.7 % — ABNORMAL LOW (ref 38.4–49.9)
HEMOGLOBIN: 10.2 g/dL — AB (ref 13.0–17.1)
LYMPH%: 6.1 % — ABNORMAL LOW (ref 14.0–49.0)
MCH: 28.6 pg (ref 27.2–33.4)
MCHC: 32.3 g/dL (ref 32.0–36.0)
MCV: 88.8 fL (ref 79.3–98.0)
MONO#: 1.3 10*3/uL — ABNORMAL HIGH (ref 0.1–0.9)
MONO%: 11.9 % (ref 0.0–14.0)
NEUT%: 79.7 % — ABNORMAL HIGH (ref 39.0–75.0)
NEUTROS ABS: 8.7 10*3/uL — AB (ref 1.5–6.5)
Platelets: 366 10*3/uL (ref 140–400)
RBC: 3.57 10*6/uL — ABNORMAL LOW (ref 4.20–5.82)
RDW: 15.2 % — AB (ref 11.0–14.6)
WBC: 10.9 10*3/uL — ABNORMAL HIGH (ref 4.0–10.3)
lymph#: 0.7 10*3/uL — ABNORMAL LOW (ref 0.9–3.3)

## 2014-02-27 MED ORDER — ACETAMINOPHEN 325 MG PO TABS
ORAL_TABLET | ORAL | Status: AC
  Filled 2014-02-27: qty 2

## 2014-02-27 MED ORDER — DIPHENHYDRAMINE HCL 25 MG PO CAPS
50.0000 mg | ORAL_CAPSULE | Freq: Once | ORAL | Status: AC
  Administered 2014-02-27: 50 mg via ORAL

## 2014-02-27 MED ORDER — DIPHENHYDRAMINE HCL 25 MG PO CAPS
ORAL_CAPSULE | ORAL | Status: AC
  Filled 2014-02-27: qty 2

## 2014-02-27 MED ORDER — SODIUM CHLORIDE 0.9 % IV SOLN
375.0000 mg/m2 | Freq: Once | INTRAVENOUS | Status: AC
  Administered 2014-02-27: 900 mg via INTRAVENOUS
  Filled 2014-02-27: qty 90

## 2014-02-27 MED ORDER — METHYLPREDNISOLONE SODIUM SUCC 40 MG IJ SOLR
INTRAMUSCULAR | Status: AC
  Filled 2014-02-27: qty 2

## 2014-02-27 MED ORDER — METHYLPREDNISOLONE SODIUM SUCC 125 MG IJ SOLR
80.0000 mg | Freq: Once | INTRAMUSCULAR | Status: AC
  Administered 2014-02-27: 80 mg via INTRAVENOUS

## 2014-02-27 MED ORDER — ACETAMINOPHEN 325 MG PO TABS
650.0000 mg | ORAL_TABLET | Freq: Once | ORAL | Status: AC
  Administered 2014-02-27: 650 mg via ORAL

## 2014-02-27 MED ORDER — SODIUM CHLORIDE 0.9 % IV SOLN
Freq: Once | INTRAVENOUS | Status: AC
  Administered 2014-02-27: 12:00:00 via INTRAVENOUS

## 2014-02-27 NOTE — Patient Instructions (Signed)
[facility] Discharge Instructions for Patients Receiving Chemotherapy  Today you received the following chemotherapy agents rixitan  To help prevent nausea and vomiting after your treatment, we encourage you to take your nausea medication as directed.    If you develop nausea and vomiting that is not controlled by your nausea medication, call the clinic.   BELOW ARE SYMPTOMS THAT SHOULD BE REPORTED IMMEDIATELY:  *FEVER GREATER THAN 100.5 F  *CHILLS WITH OR WITHOUT FEVER  NAUSEA AND VOMITING THAT IS NOT CONTROLLED WITH YOUR NAUSEA MEDICATION  *UNUSUAL SHORTNESS OF BREATH  *UNUSUAL BRUISING OR BLEEDING  TENDERNESS IN MOUTH AND THROAT WITH OR WITHOUT PRESENCE OF ULCERS  *URINARY PROBLEMS  *BOWEL PROBLEMS  UNUSUAL RASH Items with * indicate a potential emergency and should be followed up as soon as possible.  Feel free to call the clinic you have any questions or concerns. The clinic phone number is [phone].

## 2014-02-27 NOTE — Progress Notes (Addendum)
OFFICE PROGRESS NOTE  Interval history:  Mr. Richard Hopkins is a 68 year old man recently found to have a factor VIII inhibitor. He presented with spontaneous bruising. PTT was elevated. The PTT mixing study was consistent with an inhibitor. He received factor VIIa on 02/18/2014 and began Rituxan on 02/20/2014. He was also started on prednisone.  He was discharged home on 02/21/2014 on prednisone 80 mg daily.  The copy of the discharge instructions he brought with him to today's visit indicates prednisone 10 mg daily which is what he has been taking. He feels that overall the bruising is better. He notes progressive swelling of both legs left greater than right. He has pain at the left lower leg. He denies fever.   Objective: Temperature 96.9, heart rate 86, respirations 19, blood pressure 140/86. Lungs are clear. Regular cardiac rhythm. Abdomen soft and nontender. No organomegaly. He has large ecchymoses on all extremities. Extensive ecchymoses at both ankles and feet. Erythema at the left medial calf with associated pain. Pitting edema left leg below the knee and right lower leg. The left leg is considerably more edematous than the right leg. Left calf 38 cm; left ankle 32 cm. Right calf 35 and half centimeters; right ankle 31 cm. Left pedal pulse intact.    Lab Results: Lab Results  Component Value Date   WBC 10.9* 02/27/2014   HGB 10.2* 02/27/2014   HCT 31.7* 02/27/2014   MCV 88.8 02/27/2014   PLT 366 02/27/2014   NEUTROABS 8.7* 02/27/2014    Chemistry:    Chemistry      Component Value Date/Time   NA 137 02/21/2014 0410   K 4.4 02/21/2014 0410   CL 100 02/21/2014 0410   CO2 23 02/21/2014 0410   BUN 34* 02/21/2014 0410   CREATININE 1.08 02/21/2014 0410      Component Value Date/Time   CALCIUM 8.9 02/21/2014 0410   ALKPHOS 62 02/18/2014 0500   AST 20 02/18/2014 0500   ALT 14 02/18/2014 0500   BILITOT 0.6 02/18/2014 0500       Studies/Results: Dg Chest Port 1 View  02/19/2014   CLINICAL DATA:  Evaluate  for pulmonary nodules.  Cough.  EXAM: PORTABLE CHEST - 1 VIEW  COMPARISON:  03/01/2013  FINDINGS: There is bilateral irregular interstitial thickening that is most evident peripherally in the right lower lung zone. This is stable consistent with fibrosis. No areas of lung consolidation. No discrete lung nodules. No pulmonary edema. No pleural effusion or pneumothorax elevation of the left hemidiaphragm is stable.  Cardiac silhouette is normal in size. Aorta is mildly uncoiled. No mediastinal or hilar masses.  Bony thorax is demineralized but grossly intact.  IMPRESSION: No acute cardiopulmonary disease.   Electronically Signed   By: Lajean Manes M.D.   On: 02/19/2014 20:24    Medications: I have reviewed the patient's current medications.  Assessment/Plan: 1. Factor VIII inhibitor status post factor VIIa 02/18/2014, Rituxan 02/20/2014; currently taking prednisone 10 mg daily which is an incorrect dose. Plan was for him to be discharged on prednisone 80 mg daily. 2. Bleeding secondary to #1 with extensive ecchymoses. 3. Anemia secondary to bleeding, polyphlebotomy. Improved. 4. Diabetes. 5. Hypertension. 6. COPD. 7. BPH.   Dispositon-he had significant edema of the left leg as well as extensive ecchymoses consistent with bleeding. He has been taking prednisone at an incorrect dose. Dr. Beryle Beams recommends Solu-Medrol 80 mg IV today and to begin prednisone 80 mg daily on 02/28/2014 (confirmed ready for pickup at his pharmacy). We  will followup on the PTT from today. Plan to proceed with week 2 Rituxan today as scheduled.  He is scheduled to return for a followup visit and week 3 Rituxan in one week.   Patient seen with Dr. Beryle Beams.   Ned Card ANP/GNP-BC   Hematology attending: History exam and impression accurate as recorded above by a nurse practitioner. 68 year old man who recently presented with large, spontaneous, ecchymoses and found to have a inhibitor to clotting factor  VIII. Secondary to rapid expansion of hematomas, he did receive a dose of recombinant factor VII a in the hospital to stop bleeding. He was started on a trial of steroids. Apparently there was some confusion on his discharge sheet about the dose and he was only taking 10 mg of prednisone at discharge March 3. He is here today to begin a series of Rituxan anti-B cell antibody treatments in attempt to further suppress the inhibitor. Since hospital discharge, he is noted increased swelling of his left calf. On exam, he has residual large ecchymoses on his trunk and extremities. He has a large ecchymosis on his left foot. The left calf is swollen and firm but not tense. The dorsalis pedis pulse is easily palpable. No evidence for a compartment syndrome at this point. I am going to go ahead and give him a dose of IV Solu-Medrol today and he is instructed to take the appropriate dose of prednisone for his weight-80 mg daily. He will continue to receive Rituxan weekly or an additional 3 doses.  Murriel Hopper, MD, [place]  Hematology-Oncology

## 2014-02-27 NOTE — Progress Notes (Signed)
Checked in new pt with no financial concerns. °

## 2014-02-28 LAB — APTT: APTT: 83 s — AB (ref 24–37)

## 2014-02-28 LAB — FACTOR 8 ASSAY: Coagulation Factor VIII: 4 % — ABNORMAL LOW (ref 73–140)

## 2014-03-06 ENCOUNTER — Other Ambulatory Visit (HOSPITAL_BASED_OUTPATIENT_CLINIC_OR_DEPARTMENT_OTHER): Payer: Medicare Other

## 2014-03-06 ENCOUNTER — Ambulatory Visit (HOSPITAL_BASED_OUTPATIENT_CLINIC_OR_DEPARTMENT_OTHER): Payer: Medicare Other

## 2014-03-06 ENCOUNTER — Ambulatory Visit (HOSPITAL_BASED_OUTPATIENT_CLINIC_OR_DEPARTMENT_OTHER): Payer: Medicare Other | Admitting: Oncology

## 2014-03-06 ENCOUNTER — Other Ambulatory Visit: Payer: Self-pay | Admitting: *Deleted

## 2014-03-06 VITALS — BP 181/91 | HR 71 | Temp 97.1°F | Resp 18

## 2014-03-06 VITALS — BP 154/92 | HR 83 | Temp 98.0°F | Resp 20 | Ht 76.0 in | Wt 231.5 lb

## 2014-03-06 DIAGNOSIS — D68318 Other hemorrhagic disorder due to intrinsic circulating anticoagulants, antibodies, or inhibitors: Secondary | ICD-10-CM

## 2014-03-06 DIAGNOSIS — D689 Coagulation defect, unspecified: Secondary | ICD-10-CM

## 2014-03-06 DIAGNOSIS — M7989 Other specified soft tissue disorders: Secondary | ICD-10-CM

## 2014-03-06 DIAGNOSIS — B370 Candidal stomatitis: Secondary | ICD-10-CM

## 2014-03-06 LAB — CBC WITH DIFFERENTIAL/PLATELET
BASO%: 0.1 % (ref 0.0–2.0)
BASOS ABS: 0 10*3/uL (ref 0.0–0.1)
EOS%: 0.4 % (ref 0.0–7.0)
Eosinophils Absolute: 0.1 10*3/uL (ref 0.0–0.5)
HEMATOCRIT: 35.4 % — AB (ref 38.4–49.9)
HEMOGLOBIN: 11.1 g/dL — AB (ref 13.0–17.1)
LYMPH%: 9.1 % — ABNORMAL LOW (ref 14.0–49.0)
MCH: 28 pg (ref 27.2–33.4)
MCHC: 31.4 g/dL — ABNORMAL LOW (ref 32.0–36.0)
MCV: 89.4 fL (ref 79.3–98.0)
MONO#: 1.6 10*3/uL — AB (ref 0.1–0.9)
MONO%: 13.1 % (ref 0.0–14.0)
NEUT#: 9.2 10*3/uL — ABNORMAL HIGH (ref 1.5–6.5)
NEUT%: 77.3 % — AB (ref 39.0–75.0)
Platelets: 400 10*3/uL (ref 140–400)
RBC: 3.96 10*6/uL — ABNORMAL LOW (ref 4.20–5.82)
RDW: 16.2 % — ABNORMAL HIGH (ref 11.0–14.6)
WBC: 11.9 10*3/uL — AB (ref 4.0–10.3)
lymph#: 1.1 10*3/uL (ref 0.9–3.3)
nRBC: 0 % (ref 0–0)

## 2014-03-06 MED ORDER — RITUXIMAB CHEMO INJECTION 10 MG/ML
375.0000 mg/m2 | Freq: Once | INTRAVENOUS | Status: AC
  Administered 2014-03-06: 900 mg via INTRAVENOUS
  Filled 2014-03-06: qty 90

## 2014-03-06 MED ORDER — OXYCODONE HCL 5 MG PO TABS: 5.0000 mg | ORAL_TABLET | Freq: Four times a day (QID) | ORAL | Status: DC | PRN

## 2014-03-06 MED ORDER — ACETAMINOPHEN 325 MG PO TABS
ORAL_TABLET | ORAL | Status: AC
  Filled 2014-03-06: qty 2

## 2014-03-06 MED ORDER — FUROSEMIDE 20 MG PO TABS: 20.0000 mg | ORAL_TABLET | Freq: Two times a day (BID) | ORAL | Status: DC

## 2014-03-06 MED ORDER — DIPHENHYDRAMINE HCL 25 MG PO CAPS
ORAL_CAPSULE | ORAL | Status: AC
  Filled 2014-03-06: qty 2

## 2014-03-06 MED ORDER — OXYCODONE-ACETAMINOPHEN 5-325 MG PO TABS
1.0000 | ORAL_TABLET | Freq: Once | ORAL | Status: AC
  Administered 2014-03-06: 1 via ORAL

## 2014-03-06 MED ORDER — OXYCODONE-ACETAMINOPHEN 5-325 MG PO TABS
ORAL_TABLET | ORAL | Status: AC
  Filled 2014-03-06: qty 1

## 2014-03-06 MED ORDER — DIPHENHYDRAMINE HCL 25 MG PO CAPS
50.0000 mg | ORAL_CAPSULE | Freq: Once | ORAL | Status: AC
  Administered 2014-03-06: 50 mg via ORAL

## 2014-03-06 MED ORDER — SODIUM CHLORIDE 0.9 % IV SOLN
Freq: Once | INTRAVENOUS | Status: AC
  Administered 2014-03-06: 11:00:00 via INTRAVENOUS

## 2014-03-06 MED ORDER — FLUCONAZOLE 100 MG PO TABS: 100.0000 mg | ORAL_TABLET | Freq: Every day | ORAL | Status: DC

## 2014-03-06 MED ORDER — ACETAMINOPHEN 325 MG PO TABS
650.0000 mg | ORAL_TABLET | Freq: Once | ORAL | Status: AC
  Administered 2014-03-06: 650 mg via ORAL

## 2014-03-06 MED ORDER — POTASSIUM CHLORIDE CRYS ER 20 MEQ PO TBCR: 20.0000 meq | EXTENDED_RELEASE_TABLET | Freq: Every day | ORAL | Status: DC

## 2014-03-06 NOTE — Patient Instructions (Signed)
[facility] Discharge Instructions for Patients Receiving Chemotherapy  Today you received the following chemotherapy agents :  Rituxan.  To help prevent nausea and vomiting after your treatment, we encourage you to take your nausea medication as instructed by your physician.   If you develop nausea and vomiting that is not controlled by your nausea medication, call the clinic.   BELOW ARE SYMPTOMS THAT SHOULD BE REPORTED IMMEDIATELY:  *FEVER GREATER THAN 100.5 F  *CHILLS WITH OR WITHOUT FEVER  NAUSEA AND VOMITING THAT IS NOT CONTROLLED WITH YOUR NAUSEA MEDICATION  *UNUSUAL SHORTNESS OF BREATH  *UNUSUAL BRUISING OR BLEEDING  TENDERNESS IN MOUTH AND THROAT WITH OR WITHOUT PRESENCE OF ULCERS  *URINARY PROBLEMS  *BOWEL PROBLEMS  UNUSUAL RASH Items with * indicate a potential emergency and should be followed up as soon as possible.  Feel free to call the clinic you have any questions or concerns. The clinic phone number is [phone].

## 2014-03-06 NOTE — Progress Notes (Signed)
1430 -  Pt complained of bilateral legs pain.  Stated  10/10 .  Dr. Benay Spice notified.  Order received to give Percocet 1 tablet per md.   1435  -  Percocet 5/325 tablet given po as ordered. 1625  -  Pt stated relief of legs pain -  Rated level  3.   Pt was stable at discharge via ambulation by self - No sign of drowsiness noted.

## 2014-03-06 NOTE — Progress Notes (Signed)
   [place]    OFFICE PROGRESS NOTE   INTERVAL HISTORY:   Mr. Richard Hopkins returns for scheduled followup of the factor VIII inhibitor. He received a second treatment with rituximab on 02/27/2014. He tolerated the rituximab well. He is maintained on prednisone at a dose of 80 mg daily.  He complains of increased leg swelling and pain. No new bleeding. The swelling and ecchymosis at the right wrist has improved. The ecchymoses over the legs are resolving. He is taking oxycodone for pain in the legs.  Objective:  Vital signs in last 24 hours:  Blood pressure 154/92, pulse 83, temperature 98 F (36.7 C), temperature source Oral, resp. rate 20, height 6\' 4"  (1.93 m), weight 231 lb 8 oz (105.008 kg), SpO2 100.00%.    HEENT: Thrush at the posterior palate and pharynx, no bleeding Resp: Lungs clear bilaterally Cardio: Regular rate and rhythm GI: No hepatomegaly Vascular: 1+ pitting edema below the knee bilaterally  Skin: Ecchymoses at the right forearm, right thigh, right upper abdomen, and bilateral lower leg/ankles     Lab Results:  Lab Results  Component Value Date   WBC 11.9* 03/06/2014   HGB 11.1* 03/06/2014   HCT 35.4* 03/06/2014   MCV 89.4 03/06/2014   PLT 400 03/06/2014   NEUTROABS 9.2* 03/06/2014   02/27/2014-factor VIII level-4%, APTT 83   Medications: I have reviewed the patient's current medications.  Assessment/Plan: 1. 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 Bleeding secondary to #1 with extensive ecchymoses-improved 3. Anemia secondary to bleeding, polyphlebotomy. Improved. 4. Diabetes. 5. Hypertension. 6. COPD. 7. BPH. 8. Bilateral lower leg swelling-most likely related to prednisone, he began a trial of low-dose Lasix 9. Oral candidiasis-we will prescribe Diflucan   Disposition:  The ecchymoses appear improved. The lower leg and foot edema does not  appear related to hematomas. He will begin a trial of low-dose Lasix. We will followup on the factor VIII level and APTT from today. Mr. Shuman will return for an office visit and week #4 of rituximab on 03/13/2014. He will contact us in the interim for bleeding or new symptoms. We refilled a prescription for oxycodone to use as needed for pain. He will continue prednisone at a dose of 80 mg daily pending the factor VIII level and PTT from today.   Betsy Coder, MD  03/06/2014  3:47 PM

## 2014-03-07 ENCOUNTER — Telehealth: Payer: Self-pay | Admitting: Oncology

## 2014-03-07 LAB — FACTOR 8 ASSAY: Coagulation Factor VIII: 17 % — ABNORMAL LOW (ref 73–140)

## 2014-03-07 LAB — APTT: APTT: 46 s — AB (ref 24–37)

## 2014-03-07 NOTE — Telephone Encounter (Signed)
s.w. pt and advised on 3.23 appts....pt ok and aware

## 2014-03-08 ENCOUNTER — Telehealth: Payer: Self-pay | Admitting: *Deleted

## 2014-03-08 ENCOUNTER — Encounter: Payer: Self-pay | Admitting: *Deleted

## 2014-03-08 NOTE — Telephone Encounter (Signed)
Message copied by Brien Few on Wed Mar 08, 2014 11:54 AM ------      Message from: Ladell Pier      Created: Tue Mar 07, 2014  8:05 PM       Please call patient, F8 level is better, decrease prednisone to 60mg  daily ------

## 2014-03-08 NOTE — Telephone Encounter (Signed)
Called pt with lab results. Instructed him to decrease Prednisone dose to 60 mg daily. Pt voiced understanding. Pt reports the swelling is improving, pain is gone. Would like to return to work. Asking if he can pick up a letter stating he may return to work. Will review with Dr. Benay Spice.

## 2014-03-08 NOTE — Telephone Encounter (Signed)
Called to inquire what his employment is and what is required in this position. 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 Does require some walking. Works 4 hours/day for 4 days/week. Is hopeful to get back to work soon. Per Dr. Benay Spice : Would like for him to be off narcotics for pain before he returns to work and for his labs to improve more. Will discuss at next office visit on 3/23. Patient understands and agrees. Reports he has not taken narcotic since Monday-"pain is much better".

## 2014-03-13 ENCOUNTER — Ambulatory Visit (HOSPITAL_BASED_OUTPATIENT_CLINIC_OR_DEPARTMENT_OTHER): Payer: Medicare Other | Admitting: Oncology

## 2014-03-13 ENCOUNTER — Ambulatory Visit (HOSPITAL_BASED_OUTPATIENT_CLINIC_OR_DEPARTMENT_OTHER): Payer: Medicare Other

## 2014-03-13 ENCOUNTER — Other Ambulatory Visit: Payer: Self-pay | Admitting: Oncology

## 2014-03-13 ENCOUNTER — Other Ambulatory Visit (HOSPITAL_BASED_OUTPATIENT_CLINIC_OR_DEPARTMENT_OTHER): Payer: Medicare Other

## 2014-03-13 VITALS — BP 146/82 | HR 80 | Temp 97.9°F | Resp 18 | Ht 76.0 in | Wt 229.6 lb

## 2014-03-13 VITALS — BP 162/92 | HR 75 | Temp 97.4°F | Resp 20

## 2014-03-13 DIAGNOSIS — D66 Hereditary factor VIII deficiency: Secondary | ICD-10-CM

## 2014-03-13 DIAGNOSIS — D68318 Other hemorrhagic disorder due to intrinsic circulating anticoagulants, antibodies, or inhibitors: Secondary | ICD-10-CM

## 2014-03-13 DIAGNOSIS — J449 Chronic obstructive pulmonary disease, unspecified: Secondary | ICD-10-CM

## 2014-03-13 DIAGNOSIS — E119 Type 2 diabetes mellitus without complications: Secondary | ICD-10-CM

## 2014-03-13 DIAGNOSIS — Z5112 Encounter for antineoplastic immunotherapy: Secondary | ICD-10-CM

## 2014-03-13 DIAGNOSIS — D689 Coagulation defect, unspecified: Secondary | ICD-10-CM

## 2014-03-13 DIAGNOSIS — I10 Essential (primary) hypertension: Secondary | ICD-10-CM

## 2014-03-13 LAB — CBC WITH DIFFERENTIAL/PLATELET
BASO%: 0.2 % (ref 0.0–2.0)
Basophils Absolute: 0 10*3/uL (ref 0.0–0.1)
EOS%: 1 % (ref 0.0–7.0)
Eosinophils Absolute: 0.1 10*3/uL (ref 0.0–0.5)
HCT: 39 % (ref 38.4–49.9)
HGB: 12.3 g/dL — ABNORMAL LOW (ref 13.0–17.1)
LYMPH%: 11 % — ABNORMAL LOW (ref 14.0–49.0)
MCH: 28.2 pg (ref 27.2–33.4)
MCHC: 31.5 g/dL — AB (ref 32.0–36.0)
MCV: 89.4 fL (ref 79.3–98.0)
MONO#: 1.9 10*3/uL — ABNORMAL HIGH (ref 0.1–0.9)
MONO%: 15.2 % — AB (ref 0.0–14.0)
NEUT#: 9.2 10*3/uL — ABNORMAL HIGH (ref 1.5–6.5)
NEUT%: 72.6 % (ref 39.0–75.0)
NRBC: 0 % (ref 0–0)
PLATELETS: 287 10*3/uL (ref 140–400)
RBC: 4.36 10*6/uL (ref 4.20–5.82)
RDW: 15.5 % — ABNORMAL HIGH (ref 11.0–14.6)
WBC: 12.6 10*3/uL — ABNORMAL HIGH (ref 4.0–10.3)
lymph#: 1.4 10*3/uL (ref 0.9–3.3)

## 2014-03-13 MED ORDER — SODIUM CHLORIDE 0.9 % IV SOLN
Freq: Once | INTRAVENOUS | Status: AC
  Administered 2014-03-13: 11:00:00 via INTRAVENOUS

## 2014-03-13 MED ORDER — ACETAMINOPHEN 325 MG PO TABS
650.0000 mg | ORAL_TABLET | Freq: Once | ORAL | Status: AC
  Administered 2014-03-13: 650 mg via ORAL

## 2014-03-13 MED ORDER — DIPHENHYDRAMINE HCL 25 MG PO CAPS
ORAL_CAPSULE | ORAL | Status: AC
  Filled 2014-03-13: qty 2

## 2014-03-13 MED ORDER — ACETAMINOPHEN 325 MG PO TABS
ORAL_TABLET | ORAL | Status: AC
  Filled 2014-03-13: qty 2

## 2014-03-13 MED ORDER — SODIUM CHLORIDE 0.9 % IV SOLN
375.0000 mg/m2 | Freq: Once | INTRAVENOUS | Status: AC
  Administered 2014-03-13: 900 mg via INTRAVENOUS
  Filled 2014-03-13: qty 90

## 2014-03-13 MED ORDER — DIPHENHYDRAMINE HCL 25 MG PO CAPS
50.0000 mg | ORAL_CAPSULE | Freq: Once | ORAL | Status: AC
  Administered 2014-03-13: 50 mg via ORAL

## 2014-03-13 NOTE — Progress Notes (Addendum)
OFFICE PROGRESS NOTE  Interval history:  Mr. Richard Hopkins returns for followup of a factor VIII inhibitor. He completed week 3 Rituxan on 03/06/2014. The factor VIII level was better on 03/06/2014. Prednisone dose was decreased to 60 mg daily.  Leg swelling and pain are significantly improved since beginning Lasix. He notes bruises are beginning to resolve. No new bruises. No skin rash. No shortness of breath or chest pain.   Objective: Filed Vitals:   03/13/14 0912  BP: 168/99  Pulse: 80  Temp: 97.9 F (36.6 C)  Resp: 18   Oropharynx is without thrush or ulceration. Lungs are clear. Regular cardiac rhythm. Abdomen soft and nontender. No organomegaly. 1+ edema at the lower legs bilaterally. Resolved ecchymosis at the right forearm, right thigh and bilateral lower legs/feet.   Lab Results: Lab Results  Component Value Date   WBC 12.6* 03/13/2014   HGB 12.3* 03/13/2014   HCT 39.0 03/13/2014   MCV 89.4 03/13/2014   PLT 287 03/13/2014   NEUTROABS 9.2* 03/13/2014    Chemistry:    Chemistry      Component Value Date/Time   NA 137 02/21/2014 0410   K 4.4 02/21/2014 0410   CL 100 02/21/2014 0410   CO2 23 02/21/2014 0410   BUN 34* 02/21/2014 0410   CREATININE 1.08 02/21/2014 0410      Component Value Date/Time   CALCIUM 8.9 02/21/2014 0410   ALKPHOS 62 02/18/2014 0500   AST 20 02/18/2014 0500   ALT 14 02/18/2014 0500   BILITOT 0.6 02/18/2014 0500       Studies/Results: Dg Chest Port 1 View  02/19/2014   CLINICAL DATA:  Evaluate for pulmonary nodules.  Cough.  EXAM: PORTABLE CHEST - 1 VIEW  COMPARISON:  03/01/2013  FINDINGS: There is bilateral irregular interstitial thickening that is most evident peripherally in the right lower lung zone. This is stable consistent with fibrosis. No areas of lung consolidation. No discrete lung nodules. No pulmonary edema. No pleural effusion or pneumothorax elevation of the left hemidiaphragm is stable.  Cardiac silhouette is normal in size. Aorta is mildly uncoiled.  No mediastinal or hilar masses.  Bony thorax is demineralized but grossly intact.  IMPRESSION: No acute cardiopulmonary disease.   Electronically Signed   By: Lajean Manes M.D.   On: 02/19/2014 20:24    Medications: I have reviewed the patient's current medications.  Assessment/Plan: 1. 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  2. Bleeding secondary to #1 with extensive ecchymoses-improved  3. Anemia secondary to bleeding, polyphlebotomy. Improved.  4. Diabetes.  5. Hypertension.  6. COPD.  7. BPH.  8. Bilateral lower leg swelling-most likely related to prednisone. Improved with Lasix. 9. Oral candidiasis. He completed a course of Diflucan. Resolved.  Dispositon-he appears stable. The ecchymoses appear improved. We will followup on the factor VIII level and PTT from today.Plan to proceed with the fourth and final week of Rituxan today as scheduled. He will return for labs in 2 weeks and labs and a followup visit in 4 weeks. He will continue prednisone 60 mg daily pending the factor VIII level and PTT from today.  Patient seen with Dr. Benay Spice.  Ned Card ANP/GNP-BC    This was a shared visit with Ned Card. The bleeding appears to have resolved. He has tolerated the rituximab well. The factor VIII level was improved last week. We will followup on the factor VIII level and APTT from today and  continue tapering the prednisone as indicated. He will receive a final dose of rituximab today.  Julieanne Manson, M.D.

## 2014-03-13 NOTE — Progress Notes (Signed)
Requesting MD approve for him to return to work-

## 2014-03-13 NOTE — Patient Instructions (Signed)
[facility] Discharge Instructions for Patients Receiving Chemotherapy  Today you received the following chemotherapy agents; Rituxan.     BELOW ARE SYMPTOMS THAT SHOULD BE REPORTED IMMEDIATELY:  *FEVER GREATER THAN 100.5 F  *CHILLS WITH OR WITHOUT FEVER  NAUSEA AND VOMITING THAT IS NOT CONTROLLED WITH YOUR NAUSEA MEDICATION  *UNUSUAL SHORTNESS OF BREATH  *UNUSUAL BRUISING OR BLEEDING  TENDERNESS IN MOUTH AND THROAT WITH OR WITHOUT PRESENCE OF ULCERS  *URINARY PROBLEMS  *BOWEL PROBLEMS  UNUSUAL RASH Items with * indicate a potential emergency and should be followed up as soon as possible.  Feel free to call the clinic you have any questions or concerns. The clinic phone number is [phone].    

## 2014-03-14 ENCOUNTER — Telehealth: Payer: Self-pay | Admitting: Oncology

## 2014-03-14 LAB — APTT: aPTT: 43 seconds — ABNORMAL HIGH (ref 24–37)

## 2014-03-14 LAB — FACTOR 8 ASSAY: Coagulation Factor VIII: 21 % — ABNORMAL LOW (ref 73–140)

## 2014-03-14 NOTE — Telephone Encounter (Signed)
s.w. pt and advised on April appts....pt ok and aware °

## 2014-03-15 ENCOUNTER — Telehealth: Payer: Self-pay | Admitting: *Deleted

## 2014-03-15 NOTE — Telephone Encounter (Signed)
Message copied by Norma Fredrickson on Wed Mar 15, 2014 11:47 AM ------      Message from: Ladell Pier      Created: Tue Mar 14, 2014  5:38 PM       Please call patient, F8 is better, decrease prednisone to 40mg  daily, try decreasing lasix to once daily ------

## 2014-03-15 NOTE — Telephone Encounter (Signed)
Called and informed patient that Richard Hopkins is better.  Also, informed patient to decrease prednisone to 40 mg daily, and try decreasing lasix to once daily.  Per Dr. Benay Spice.  Patient verbalized understanding.

## 2014-03-15 NOTE — Telephone Encounter (Signed)
error 

## 2014-03-27 ENCOUNTER — Other Ambulatory Visit (HOSPITAL_BASED_OUTPATIENT_CLINIC_OR_DEPARTMENT_OTHER): Payer: Medicare Other

## 2014-03-27 DIAGNOSIS — D68318 Other hemorrhagic disorder due to intrinsic circulating anticoagulants, antibodies, or inhibitors: Secondary | ICD-10-CM

## 2014-03-27 DIAGNOSIS — D66 Hereditary factor VIII deficiency: Secondary | ICD-10-CM

## 2014-03-27 LAB — BASIC METABOLIC PANEL (CC13)
ANION GAP: 12 meq/L — AB (ref 3–11)
BUN: 14.7 mg/dL (ref 7.0–26.0)
CALCIUM: 9.5 mg/dL (ref 8.4–10.4)
CO2: 25 mEq/L (ref 22–29)
CREATININE: 1.1 mg/dL (ref 0.7–1.3)
Chloride: 104 mEq/L (ref 98–109)
Glucose: 109 mg/dl (ref 70–140)
Potassium: 4 mEq/L (ref 3.5–5.1)
Sodium: 141 mEq/L (ref 136–145)

## 2014-03-28 LAB — APTT: APTT: 37 s (ref 24–37)

## 2014-03-29 ENCOUNTER — Telehealth: Payer: Self-pay | Admitting: *Deleted

## 2014-03-29 NOTE — Telephone Encounter (Signed)
Patient notified and instructed to decrease prednisone to 30 mg daily (1.5 tabs/day). Follow up as scheduled. Asking nurse why he so tired? Made him aware that steroids over longer period of time can do this.

## 2014-03-29 NOTE — Telephone Encounter (Signed)
Message copied by Tania Ade on Wed Mar 29, 2014  4:32 PM ------      Message from: Ladell Pier      Created: Tue Mar 28, 2014  5:48 PM       Please call patient, APTT is better, decrease prednisone to 40mg  daily(be sure he was taking 60mg -he may already be on 40mg ) ------

## 2014-04-06 ENCOUNTER — Ambulatory Visit: Payer: Medicare Other | Admitting: Oncology

## 2014-04-10 ENCOUNTER — Ambulatory Visit (HOSPITAL_BASED_OUTPATIENT_CLINIC_OR_DEPARTMENT_OTHER): Payer: Medicare Other | Admitting: Nurse Practitioner

## 2014-04-10 ENCOUNTER — Other Ambulatory Visit: Payer: Medicare Other

## 2014-04-10 VITALS — BP 155/92 | HR 82 | Temp 98.1°F | Resp 18 | Ht 76.0 in | Wt 230.5 lb

## 2014-04-10 DIAGNOSIS — D500 Iron deficiency anemia secondary to blood loss (chronic): Secondary | ICD-10-CM

## 2014-04-10 DIAGNOSIS — D68318 Other hemorrhagic disorder due to intrinsic circulating anticoagulants, antibodies, or inhibitors: Secondary | ICD-10-CM

## 2014-04-10 DIAGNOSIS — E119 Type 2 diabetes mellitus without complications: Secondary | ICD-10-CM

## 2014-04-10 DIAGNOSIS — J449 Chronic obstructive pulmonary disease, unspecified: Secondary | ICD-10-CM

## 2014-04-10 DIAGNOSIS — I10 Essential (primary) hypertension: Secondary | ICD-10-CM

## 2014-04-10 DIAGNOSIS — D66 Hereditary factor VIII deficiency: Secondary | ICD-10-CM

## 2014-04-10 NOTE — Progress Notes (Signed)
  [place] OFFICE PROGRESS NOTE   Diagnosis:  Factor VIII inhibitor.  INTERVAL HISTORY:   Richard Hopkins returns as scheduled. He completed the fourth and final weekly Rituxan on 03/13/2014. Prednisone dose was decreased to 30 mg daily 03/29/2014.  He continues to note improvement in leg swelling. He is currently taking Lasix 20 mg daily. Bruises have resolved. He denies any new bruises or bleeding. Continues to have a poor energy level.  Objective:  Vital signs in last 24 hours:  Blood pressure 155/92, pulse 82, temperature 98.1 F (36.7 C), temperature source Oral, resp. rate 18, height 6\' 4"  (1.93 m), weight 230 lb 8 oz (104.554 kg), SpO2 100.00%.    HEENT: No thrush. Lymphatics: No palpable cervical or supraclavicular lymph nodes. Resp: Lungs clear. Cardio: Regular cardiac rhythm. GI: Abdomen soft and nontender. No organomegaly. Vascular: Trace lower leg edema bilaterally.  Skin: No ecchymoses.    Lab Results:  Lab Results  Component Value Date   WBC 12.6* 03/13/2014   HGB 12.3* 03/13/2014   HCT 39.0 03/13/2014   MCV 89.4 03/13/2014   PLT 287 03/13/2014   NEUTROABS 9.2* 03/13/2014    Imaging:  No results found.  Medications: I have reviewed the patient's current medications.  Assessment/Plan: 1. 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  2. Bleeding secondary to #1 with extensive ecchymoses. Ecchymoses have resolved. 3. Anemia secondary to bleeding, polyphlebotomy. Improved.  4. Diabetes.  5. Hypertension.  6. COPD.  7. BPH.  8. Bilateral lower leg swelling-most likely related to prednisone. Improved with Lasix.   Disposition: He appears stable. The ecchymoses have resolved.  We will followup on the factor VIII level and PTT from today. He was instructed to decrease prednisone to 20 mg daily.  The leg edema is better. He will  discontinue Lasix after completing the current prescription.   He will return for labs in 2 weeks and a followup visit in 4 weeks. He will contact the office in the interim with any problems.   Plan reviewed with Dr. Benay Spice.    Owens Shark ANP/GNP-BC   04/10/2014  11:45 AM

## 2014-04-11 ENCOUNTER — Telehealth: Payer: Self-pay | Admitting: Oncology

## 2014-04-11 LAB — APTT: APTT: 33 s (ref 24–37)

## 2014-04-11 LAB — FACTOR 8 ASSAY: COAGULATION FACTOR VIII: 57 % — AB (ref 73–140)

## 2014-04-11 NOTE — Telephone Encounter (Signed)
s.w. pt and advised on May appts.....pt ok adn aware °

## 2014-04-20 ENCOUNTER — Ambulatory Visit (INDEPENDENT_AMBULATORY_CARE_PROVIDER_SITE_OTHER): Payer: Medicare Other | Admitting: Internal Medicine

## 2014-04-20 ENCOUNTER — Other Ambulatory Visit (INDEPENDENT_AMBULATORY_CARE_PROVIDER_SITE_OTHER): Payer: Medicare Other

## 2014-04-20 ENCOUNTER — Encounter: Payer: Self-pay | Admitting: Internal Medicine

## 2014-04-20 VITALS — BP 144/90 | HR 83 | Temp 98.4°F | Ht 76.0 in | Wt 227.0 lb

## 2014-04-20 DIAGNOSIS — E1165 Type 2 diabetes mellitus with hyperglycemia: Principal | ICD-10-CM

## 2014-04-20 DIAGNOSIS — IMO0001 Reserved for inherently not codable concepts without codable children: Secondary | ICD-10-CM

## 2014-04-20 DIAGNOSIS — Z0000 Encounter for general adult medical examination without abnormal findings: Secondary | ICD-10-CM

## 2014-04-20 DIAGNOSIS — Z23 Encounter for immunization: Secondary | ICD-10-CM

## 2014-04-20 DIAGNOSIS — D689 Coagulation defect, unspecified: Secondary | ICD-10-CM

## 2014-04-20 DIAGNOSIS — E119 Type 2 diabetes mellitus without complications: Secondary | ICD-10-CM

## 2014-04-20 DIAGNOSIS — N400 Enlarged prostate without lower urinary tract symptoms: Secondary | ICD-10-CM

## 2014-04-20 LAB — CBC WITH DIFFERENTIAL/PLATELET
BASOS PCT: 0.4 % (ref 0.0–3.0)
Basophils Absolute: 0 10*3/uL (ref 0.0–0.1)
EOS PCT: 1.5 % (ref 0.0–5.0)
Eosinophils Absolute: 0.2 10*3/uL (ref 0.0–0.7)
HEMATOCRIT: 41.7 % (ref 39.0–52.0)
HEMOGLOBIN: 13.3 g/dL (ref 13.0–17.0)
LYMPHS ABS: 0.5 10*3/uL — AB (ref 0.7–4.0)
Lymphocytes Relative: 4.9 % — ABNORMAL LOW (ref 12.0–46.0)
MCHC: 32 g/dL (ref 30.0–36.0)
MCV: 86.8 fl (ref 78.0–100.0)
MONO ABS: 1 10*3/uL (ref 0.1–1.0)
Monocytes Relative: 8.7 % (ref 3.0–12.0)
NEUTROS ABS: 9.3 10*3/uL — AB (ref 1.4–7.7)
Neutrophils Relative %: 84.5 % — ABNORMAL HIGH (ref 43.0–77.0)
Platelets: 267 10*3/uL (ref 150.0–400.0)
RBC: 4.8 Mil/uL (ref 4.22–5.81)
RDW: 15.4 % — ABNORMAL HIGH (ref 11.5–14.6)
WBC: 10.9 10*3/uL — AB (ref 4.5–10.5)

## 2014-04-20 LAB — BASIC METABOLIC PANEL
BUN: 13 mg/dL (ref 6–23)
CALCIUM: 9.4 mg/dL (ref 8.4–10.5)
CO2: 29 mEq/L (ref 19–32)
Chloride: 102 mEq/L (ref 96–112)
Creatinine, Ser: 1 mg/dL (ref 0.4–1.5)
GFR: 96.05 mL/min (ref >60.00)
Glucose, Bld: 98 mg/dL (ref 70–99)
POTASSIUM: 3.9 meq/L (ref 3.5–5.1)
Sodium: 138 mEq/L (ref 135–145)

## 2014-04-20 LAB — URINALYSIS, ROUTINE W REFLEX MICROSCOPIC
BILIRUBIN URINE: NEGATIVE
Hgb urine dipstick: NEGATIVE
KETONES UR: NEGATIVE
Leukocytes, UA: NEGATIVE
Nitrite: NEGATIVE
PH: 6 (ref 5.0–8.0)
RBC / HPF: NONE SEEN (ref 0–?)
Specific Gravity, Urine: 1.02 (ref 1.000–1.030)
Total Protein, Urine: NEGATIVE
URINE GLUCOSE: NEGATIVE
Urobilinogen, UA: 0.2 (ref 0.0–1.0)

## 2014-04-20 LAB — HEPATIC FUNCTION PANEL
ALT: 17 U/L (ref 0–53)
AST: 19 U/L (ref 0–37)
Albumin: 3.6 g/dL (ref 3.5–5.2)
Alkaline Phosphatase: 51 U/L (ref 39–117)
BILIRUBIN TOTAL: 0.3 mg/dL (ref 0.3–1.2)
Bilirubin, Direct: 0.1 mg/dL (ref 0.0–0.3)
Total Protein: 6.2 g/dL (ref 6.0–8.3)

## 2014-04-20 LAB — LIPID PANEL
Cholesterol: 183 mg/dL (ref 0–200)
HDL: 90.6 mg/dL (ref >39.00)
LDL CALC: 71 mg/dL (ref 0–99)
Total CHOL/HDL Ratio: 2
Triglycerides: 106 mg/dL (ref 0.0–149.0)
VLDL: 21.2 mg/dL (ref 0.0–40.0)

## 2014-04-20 LAB — MICROALBUMIN / CREATININE URINE RATIO
CREATININE, U: 117.7 mg/dL
MICROALB UR: 0.5 mg/dL (ref 0.0–1.9)
Microalb Creat Ratio: 0.4 mg/g (ref 0.0–30.0)

## 2014-04-20 LAB — PSA: PSA: 1.54 ng/mL (ref 0.10–4.00)

## 2014-04-20 LAB — TSH: TSH: 1 u[IU]/mL (ref 0.35–5.50)

## 2014-04-20 LAB — HEMOGLOBIN A1C: Hgb A1c MFr Bld: 6.4 % (ref 4.6–6.5)

## 2014-04-20 MED ORDER — TIZANIDINE HCL 4 MG PO TABS: 4.0000 mg | ORAL_TABLET | Freq: Four times a day (QID) | ORAL | Status: DC | PRN

## 2014-04-20 MED ORDER — LOSARTAN POTASSIUM 100 MG PO TABS: 100.0000 mg | ORAL_TABLET | Freq: Every day | ORAL | Status: DC

## 2014-04-20 MED ORDER — OXYCODONE HCL 5 MG PO TABS: 5.0000 mg | ORAL_TABLET | Freq: Four times a day (QID) | ORAL | Status: DC | PRN

## 2014-04-20 NOTE — Assessment & Plan Note (Signed)
stable overall by history and exam, recent data reviewed with pt, and pt to continue medical treatment as before,  to f/u any worsening symptoms or concerns Lab Results  Component Value Date   HGBA1C 6.3 03/24/2013

## 2014-04-20 NOTE — Progress Notes (Signed)
Subjective:    Patient ID: Richard Hopkins, male    DOB: 01/04/1946, 68 y.o.   MRN: [id number]  HPI  Here for wellness and f/u;  Overall doing ok;  Pt denies CP, worsening SOB, DOE, wheezing, orthopnea, PND, worsening LE edema, palpitations, dizziness or syncope.  Pt denies neurological change such as new headache, facial or extremity weakness.  Pt denies polydipsia, polyuria, or low sugar symptoms. Pt states overall good compliance with treatment and medications, good tolerability, and has been trying to follow lower cholesterol diet.  Pt denies worsening depressive symptoms, suicidal ideation or panic. No fever, night sweats, wt loss, loss of appetite, or other constitutional symptoms.  Pt states good ability with ADL's, has low fall risk, home safety reviewed and adequate, no other significant changes in hearing or vision, and only occasionally active with exercise  Has gained considerable wt with recent steroid tx, BP now elevated > 140 consistently.  Recently being tx/followed for acquired factor VIII deficiency. Pt c/o right LBP acute onset x 2 wkw , mod, without change in severity, intermittent, but no bowel or bladder change, fever, wt loss,  worsening LE pain/numbness/weakness, gait change or falls.  Has hx of 4 lumbar surgury prior Past Medical History  Diagnosis Date  . THRUSH   . ADENOMATOUS COLONIC POLYP   . DIABETES MELLITUS, TYPE II   . HYPERLIPIDEMIA   . SMOKER   . BLURRED VISION   . HYPERTENSION   . ALLERGIC RHINITIS   . CHRONIC OBSTRUCTIVE PULMONARY DISEASE, ACUTE EXACERBATION   . COPD   . PULMONARY FIBROSIS   . ESOPHAGEAL STRICTURE   . GERD   . DIVERTICULAR DISEASE   . BENIGN PROSTATIC HYPERTROPHY   . Acute prostatitis   . EPIDIDYMO-ORCHITIS   . SKIN LESION   . LUMBAR DISC DISORDER   . Pain in soft tissues of limb   . MUSCLE CRAMPS   . FATIGUE   . MASS, SUPERFICIAL   . DYSPNEA   . Wheezing   . DYSPNEA/SHORTNESS OF BREATH   . Nocturia   . PSA, INCREASED   .  ABNORMAL ELECTROCARDIOGRAM   . ERECTILE DYSFUNCTION, ORGANIC, HX OF   . SYNCOPE   . Brain tumor   . COPD (chronic obstructive pulmonary disease)   . Chronic steroid use    Past Surgical History  Procedure Laterality Date  . Back surgery      x 4 disabled after 2003  . Elbow arthroscopy      reports that he has been smoking Cigarettes.  He has a 50 pack-year smoking history. He has never used smokeless tobacco. He reports that he drinks alcohol. He reports that he does not use illicit drugs. family history includes Asthma in his mother; Diabetes in his mother; Emphysema in his mother. There is no history of Colon cancer. Allergies  Allergen Reactions  . Ace Inhibitors Cough  . Cardura [Doxazosin Mesylate] Other (See Comments)    dizziness  . Doxycycline Nausea Only and Other (See Comments)    Loss of memory; "bad feeling," dizziness  . Hydrocodone Nausea Only and Other (See Comments)    nauasea and dizziness  . Lisinopril Cough    REACTION: cough  . Alfuzosin Rash   Current Outpatient Prescriptions on File Prior to Visit  Medication Sig Dispense Refill  . acetaminophen (TYLENOL) 500 MG tablet Take 1,000 mg by mouth every 6 (six) hours as needed for mild pain or headache.      . budesonide-formoterol (SYMBICORT)  160-4.5 MCG/ACT inhaler Inhale 2 puffs into the lungs 2 (two) times daily.  1 Inhaler  0  . cloNIDine (CATAPRES) 0.1 MG tablet Take 1 tablet (0.1 mg total) by mouth 3 (three) times daily.  90 tablet  0  . furosemide (LASIX) 20 MG tablet Take 20 mg by mouth daily.      Marland Kitchen ipratropium-albuterol (DUONEB) 0.5-2.5 (3) MG/3ML SOLN Take 3 mLs by nebulization every 6 (six) hours as needed (wheezing/shortness of breath).      . lovastatin (MEVACOR) 20 MG tablet Take 40 mg by mouth daily at 6 PM.      . metFORMIN (GLUCOPHAGE) 500 MG tablet Take 1 tablet (500 mg total) by mouth 2 (two) times daily with a meal.  60 tablet  11  . omeprazole (PRILOSEC) 20 MG capsule Take 20 mg by mouth  every morning.      . potassium chloride SA (K-DUR,KLOR-CON) 20 MEQ tablet Take 1 tablet (20 mEq total) by mouth daily.  30 tablet  0  . predniSONE (DELTASONE) 20 MG tablet Take 30 mg by mouth daily with breakfast.       . vardenafil (LEVITRA) 20 MG tablet Take 1 tablet (20 mg total) by mouth daily as needed.  10 tablet  11   No current facility-administered medications on file prior to visit.   Review of Systems  Constitutional: Negative for increased diaphoresis, other activity, appetite or other siginficant weight change  HENT: Negative for worsening hearing loss, ear pain, facial swelling, mouth sores and neck stiffness.   Eyes: Negative for other worsening pain, redness or visual disturbance.  Respiratory: Negative for shortness of breath and wheezing.   Cardiovascular: Negative for chest pain and palpitations.  Gastrointestinal: Negative for diarrhea, blood in stool, abdominal distention or other pain Genitourinary: Negative for hematuria, flank pain or change in urine volume.  Musculoskeletal: Negative for myalgias or other joint complaints.  Skin: Negative for color change and wound.  Neurological: Negative for syncope and numbness. other than noted Hematological: Negative for adenopathy. or other swelling Psychiatric/Behavioral: Negative for hallucinations, self-injury, decreased concentration or other worsening agitation.      Objective:   Physical Exam BP 144/90  Pulse 83  Temp(Src) 98.4 F (36.9 C) (Oral)  Ht 6\' 4"  (1.93 m)  Wt 227 lb (102.967 kg)  BMI 27.64 kg/m2  SpO2 96% VS noted,  Constitutional: Pt is oriented to person, place, and time. Appears well-developed and well-nourished.  Head: Normocephalic and atraumatic.  Right Ear: External ear normal.  Left Ear: External ear normal.  Nose: Nose normal.  Mouth/Throat: Oropharynx is clear and moist.  Eyes: Conjunctivae and EOM are normal. Pupils are equal, round, and reactive to light.  Neck: Normal range of  motion. Neck supple. No JVD present. No tracheal deviation present.  Cardiovascular: Normal rate, regular rhythm, normal heart sounds and intact distal pulses.   Pulmonary/Chest: Effort normal and breath sounds without rales or wheezing  Abdominal: Soft. Bowel sounds are normal. NT. No HSM  Musculoskeletal: Normal range of motion. Exhibits no edema.  Lymphadenopathy:  Has no cervical adenopathy.  Neurological: Pt is alert and oriented to person, place, and time. Pt has normal reflexes. No cranial nerve deficit. Motor grossly intact Skin: Skin is warm and dry. No rash noted.  Psychiatric:  Has normal mood and affect. Behavior is normal.     Assessment & Plan:

## 2014-04-20 NOTE — Assessment & Plan Note (Signed)

## 2014-04-20 NOTE — Patient Instructions (Addendum)
You had the new Prevnar pneumonia shot today  Please take all new medication as prescribed  - the pain medication, and the muscle relaxer (hopefully to be short term prescriptions), and the blood pressure medication   Please continue all other medications as before, and refills have been done if requested. Please have the pharmacy call with any other refills you may need.  Please continue your efforts at being more active, low cholesterol diet, and weight control.  You are otherwise up to date with prevention measures today.  Please keep your appointments with your specialists as you have planned  Please go to the LAB in the Basement (turn left off the elevator) for the tests to be done today  You will be contacted by phone if any changes need to be made immediately.  Otherwise, you will receive a letter about your results with an explanation, but please check with MyChart first.  Please remember to sign up for MyChart if you have not done so, as this will be important to you in the future with finding out test results, communicating by private email, and scheduling acute appointments online when needed.  Please return in 3 months, or sooner if needed

## 2014-04-20 NOTE — Progress Notes (Signed)
Pre visit review using our clinic review tool, if applicable. No additional management support is needed unless otherwise documented below in the visit note. 

## 2014-04-24 ENCOUNTER — Other Ambulatory Visit: Payer: Medicare Other

## 2014-04-24 ENCOUNTER — Telehealth: Payer: Self-pay | Admitting: *Deleted

## 2014-04-24 NOTE — Telephone Encounter (Signed)
Received message from pt stating he is having a lot of leg pain.  Called and spoke with pt; reports "my right hip and leg hurt terribly; hard to walk when I put weight on it; using cane"  Pt also stated that he has had 4 back surgeries in the past and this may be from his back problems.  Requesting Dr. Benay Spice to give pain meds.  Discussed with Dr. Benay Spice; notified pt that MD recommends he contact his PCP since pain non-oncology related.  Pt confirmed that he does not have any bruising on his right leg "just pain all the way up to my hip"  Re-iterated again to call PCP.  Pt verbalized understanding and expressed appreciation for call back.

## 2014-04-25 ENCOUNTER — Emergency Department (HOSPITAL_COMMUNITY): Payer: Medicare Other

## 2014-04-25 ENCOUNTER — Encounter (HOSPITAL_COMMUNITY): Payer: Self-pay | Admitting: Emergency Medicine

## 2014-04-25 ENCOUNTER — Emergency Department (HOSPITAL_COMMUNITY)
Admission: EM | Admit: 2014-04-25 | Discharge: 2014-04-25 | Disposition: A | Discharge status: Home / Self Care | Payer: Medicare Other | Attending: Emergency Medicine | Admitting: Emergency Medicine

## 2014-04-25 DIAGNOSIS — F17200 Nicotine dependence, unspecified, uncomplicated: Secondary | ICD-10-CM | POA: Insufficient documentation

## 2014-04-25 DIAGNOSIS — Z9889 Other specified postprocedural states: Secondary | ICD-10-CM | POA: Insufficient documentation

## 2014-04-25 DIAGNOSIS — Z8619 Personal history of other infectious and parasitic diseases: Secondary | ICD-10-CM | POA: Insufficient documentation

## 2014-04-25 DIAGNOSIS — E785 Hyperlipidemia, unspecified: Secondary | ICD-10-CM | POA: Insufficient documentation

## 2014-04-25 DIAGNOSIS — Z872 Personal history of diseases of the skin and subcutaneous tissue: Secondary | ICD-10-CM | POA: Insufficient documentation

## 2014-04-25 DIAGNOSIS — Z79899 Other long term (current) drug therapy: Secondary | ICD-10-CM | POA: Insufficient documentation

## 2014-04-25 DIAGNOSIS — Z87448 Personal history of other diseases of urinary system: Secondary | ICD-10-CM | POA: Insufficient documentation

## 2014-04-25 DIAGNOSIS — IMO0002 Reserved for concepts with insufficient information to code with codable children: Secondary | ICD-10-CM | POA: Insufficient documentation

## 2014-04-25 DIAGNOSIS — M549 Dorsalgia, unspecified: Secondary | ICD-10-CM

## 2014-04-25 DIAGNOSIS — I10 Essential (primary) hypertension: Secondary | ICD-10-CM | POA: Insufficient documentation

## 2014-04-25 DIAGNOSIS — M545 Low back pain, unspecified: Secondary | ICD-10-CM | POA: Insufficient documentation

## 2014-04-25 DIAGNOSIS — Z860100 Personal history of colon polyps, unspecified: Secondary | ICD-10-CM | POA: Insufficient documentation

## 2014-04-25 DIAGNOSIS — Z8669 Personal history of other diseases of the nervous system and sense organs: Secondary | ICD-10-CM | POA: Insufficient documentation

## 2014-04-25 DIAGNOSIS — K219 Gastro-esophageal reflux disease without esophagitis: Secondary | ICD-10-CM | POA: Insufficient documentation

## 2014-04-25 DIAGNOSIS — R209 Unspecified disturbances of skin sensation: Secondary | ICD-10-CM | POA: Insufficient documentation

## 2014-04-25 DIAGNOSIS — E119 Type 2 diabetes mellitus without complications: Secondary | ICD-10-CM | POA: Insufficient documentation

## 2014-04-25 DIAGNOSIS — G8929 Other chronic pain: Secondary | ICD-10-CM

## 2014-04-25 MED ORDER — HYDROMORPHONE HCL PF 1 MG/ML IJ SOLN
1.0000 mg | Freq: Once | INTRAMUSCULAR | Status: AC
  Administered 2014-04-25: 1 mg via INTRAMUSCULAR
  Filled 2014-04-25: qty 1

## 2014-04-25 NOTE — Discharge Instructions (Signed)
Continue taking the pain medicine you were prescribed. You may take 2 tablets of 5mg  oxycodone every 6-8 hours if pain is severe, otherwise take 1 tablet every 6 hours as prescribed. Follow up with your primary doctor. Return if symptoms worsen such as if you experience fever, incontinence, or an inability to walk.  Back Pain, Adult Low back pain is very common. About 1 in 5 people have back pain.The cause of low back pain is rarely dangerous. The pain often gets better over time.About half of people with a sudden onset of back pain feel better in just 2 weeks. About 8 in 10 people feel better by 6 weeks.  CAUSES Some common causes of back pain include:  Strain of the muscles or ligaments supporting the spine.  Wear and tear (degeneration) of the spinal discs.  Arthritis.  Direct injury to the back. DIAGNOSIS Most of the time, the direct cause of low back pain is not known.However, back pain can be treated effectively even when the exact cause of the pain is unknown.Answering your caregiver's questions about your overall health and symptoms is one of the most accurate ways to make sure the cause of your pain is not dangerous. If your caregiver needs more information, he or she may order lab work or imaging tests (X-rays or MRIs).However, even if imaging tests show changes in your back, this usually does not require surgery. HOME CARE INSTRUCTIONS For many people, back pain returns.Since low back pain is rarely dangerous, it is often a condition that people can learn to [facility] their own.   Remain active. It is stressful on the back to sit or stand in one place. Do not sit, drive, or stand in one place for more than 30 minutes at a time. Take short walks on level surfaces as soon as pain allows.Try to increase the length of time you walk each day.  Do not stay in bed.Resting more than 1 or 2 days can delay your recovery.  Do not avoid exercise or work.Your body is made to move.It is  not dangerous to be active, even though your back may hurt.Your back will likely heal faster if you return to being active before your pain is gone.  Pay attention to your body when you bend and lift. Many people have less discomfortwhen lifting if they bend their knees, keep the load close to their bodies,and avoid twisting. Often, the most comfortable positions are those that put less stress on your recovering back.  Find a comfortable position to sleep. Use a firm mattress and lie on your side with your knees slightly bent. If you lie on your back, put a pillow under your knees.  Only take over-the-counter or prescription medicines as directed by your caregiver. Over-the-counter medicines to reduce pain and inflammation are often the most helpful.Your caregiver may prescribe muscle relaxant drugs.These medicines help dull your pain so you can more quickly return to your normal activities and healthy exercise.  Put ice on the injured area.  Put ice in a plastic bag.  Place a towel between your skin and the bag.  Leave the ice on for 15-20 minutes, 03-04 times a day for the first 2 to 3 days. After that, ice and heat may be alternated to reduce pain and spasms.  Ask your caregiver about trying back exercises and gentle massage. This may be of some benefit.  Avoid feeling anxious or stressed.Stress increases muscle tension and can worsen back pain.It is important to recognize when  you are anxious or stressed and learn ways to manage it.Exercise is a great option. SEEK MEDICAL CARE IF:  You have pain that is not relieved with rest or medicine.  You have pain that does not improve in 1 week.  You have new symptoms.  You are generally not feeling well. SEEK IMMEDIATE MEDICAL CARE IF:   You have pain that radiates from your back into your legs.  You develop new bowel or bladder control problems.  You have unusual weakness or numbness in your arms or legs.  You develop nausea  or vomiting.  You develop abdominal pain.  You feel faint. Document Released: 12/08/2005 Document Revised: 06/08/2012 Document Reviewed: 04/28/2011 [facility] Patient Information 2014 [place], [place].

## 2014-04-25 NOTE — ED Notes (Signed)
Peter, PA at bedside.

## 2014-04-25 NOTE — ED Notes (Signed)
Pt complains of lower back pain that is chronic but the pain has been getting worse the past three days, pts blood pressure is also elevated in triage 203/115

## 2014-04-25 NOTE — ED Provider Notes (Signed)
Medical screening examination/treatment/procedure(s) were performed by non-physician practitioner and as supervising physician I was immediately available for consultation/collaboration.   EKG Interpretation None        [place], DO 04/25/14 1029

## 2014-04-25 NOTE — ED Provider Notes (Signed)
CSN: [id number]     Arrival date & time 04/25/14  0434 History   First MD Initiated Contact with Patient 04/25/14 0505     Chief Complaint  Patient presents with  . Back Pain   HPI  History provided by the patient. Patient is a 68 year old male with history of multiple medical problems including history of several lumbar spine surgeries who is presenting with complaints of pain into the right hip and down the right leg. Patient reports recently being hospitalized for increased bleeding and bruising of his skin. He is receiving treatments for this and reports spending several days in the hospital. After leaving he began having worsened lower back pain and soreness that seemed to radiate diffusely across the lower back. Over the past few weeks this has now moved primarily to the right hip with pain radiating down the right leg. There is slight numbness and tingling sensation with the pain down the leg. Denies any swelling or skin changes. He did not have any recent fall or trauma. He has been using his home 5 mg oxycodone medicines for these symptoms with last dose yesterday around 4 PM. He states he's had little improvement of the pain medicine. He denies any weakness or numbness in the foot. Denies any fever, chills or sweats. Pain is worse with standing and walking. No other aggravating or alleviating factors. No other associated symptoms.    Past Medical History  Diagnosis Date  . THRUSH   . ADENOMATOUS COLONIC POLYP   . DIABETES MELLITUS, TYPE II   . HYPERLIPIDEMIA   . SMOKER   . BLURRED VISION   . HYPERTENSION   . ALLERGIC RHINITIS   . CHRONIC OBSTRUCTIVE PULMONARY DISEASE, ACUTE EXACERBATION   . COPD   . PULMONARY FIBROSIS   . ESOPHAGEAL STRICTURE   . GERD   . DIVERTICULAR DISEASE   . BENIGN PROSTATIC HYPERTROPHY   . Acute prostatitis   . EPIDIDYMO-ORCHITIS   . SKIN LESION   . LUMBAR DISC DISORDER   . Pain in soft tissues of limb   . MUSCLE CRAMPS   . FATIGUE   . MASS,  SUPERFICIAL   . DYSPNEA   . Wheezing   . DYSPNEA/SHORTNESS OF BREATH   . Nocturia   . PSA, INCREASED   . ABNORMAL ELECTROCARDIOGRAM   . ERECTILE DYSFUNCTION, ORGANIC, HX OF   . SYNCOPE   . Brain tumor   . COPD (chronic obstructive pulmonary disease)   . Chronic steroid use    Past Surgical History  Procedure Laterality Date  . Back surgery      x 4 disabled after 2003  . Elbow arthroscopy     Family History  Problem Relation Age of Onset  . Diabetes Mother   . Colon cancer Neg Hx   . Asthma Mother   . Emphysema Mother     smoker   History  Substance Use Topics  . Smoking status: Current Some Day Smoker -- 1.00 packs/day for 50 years    Types: Cigarettes    Last Attempt to Quit: 03/07/2013  . Smokeless tobacco: Never Used  . Alcohol Use: Yes    Review of Systems  Respiratory: Negative for shortness of breath.   Gastrointestinal: Negative for abdominal pain.  Musculoskeletal: Positive for back pain. Negative for neck pain.  Neurological: Negative for weakness and numbness.      Allergies  Ace inhibitors; Cardura; Doxycycline; Hydrocodone; Lisinopril; and Alfuzosin  Home Medications   Prior to Admission medications  Medication Sig Start Date End Date Taking? Authorizing Provider  acetaminophen (TYLENOL) 500 MG tablet Take 1,000 mg by mouth every 6 (six) hours as needed for mild pain or headache.    Historical Provider, MD  budesonide-formoterol (SYMBICORT) 160-4.5 MCG/ACT inhaler Inhale 2 puffs into the lungs 2 (two) times daily. 12/29/12   Tanda Rockers, MD  cloNIDine (CATAPRES) 0.1 MG tablet Take 1 tablet (0.1 mg total) by mouth 3 (three) times daily. 02/21/14   Janece Canterbury, MD  furosemide (LASIX) 20 MG tablet Take 20 mg by mouth daily. 03/06/14   Ladell Pier, MD  ipratropium-albuterol (DUONEB) 0.5-2.5 (3) MG/3ML SOLN Take 3 mLs by nebulization every 6 (six) hours as needed (wheezing/shortness of breath).    Historical Provider, MD  losartan (COZAAR) 100  MG tablet Take 1 tablet (100 mg total) by mouth daily. 04/20/14   Biagio Borg, MD  lovastatin (MEVACOR) 20 MG tablet Take 40 mg by mouth daily at 6 PM.    Historical Provider, MD  metFORMIN (GLUCOPHAGE) 500 MG tablet Take 1 tablet (500 mg total) by mouth 2 (two) times daily with a meal. 06/13/13   Biagio Borg, MD  omeprazole (PRILOSEC) 20 MG capsule Take 20 mg by mouth every morning.    Historical Provider, MD  oxyCODONE (OXY IR/ROXICODONE) 5 MG immediate release tablet Take 1 tablet (5 mg total) by mouth every 6 (six) hours as needed for severe pain. 04/20/14   Biagio Borg, MD  potassium chloride SA (K-DUR,KLOR-CON) 20 MEQ tablet Take 1 tablet (20 mEq total) by mouth daily. 03/06/14   Ladell Pier, MD  predniSONE (DELTASONE) 20 MG tablet Take 30 mg by mouth daily with breakfast.  02/21/14   Janece Canterbury, MD  tiZANidine (ZANAFLEX) 4 MG tablet Take 1 tablet (4 mg total) by mouth every 6 (six) hours as needed for muscle spasms. 04/20/14   Biagio Borg, MD   BP 203/115  Pulse 83  Temp(Src) 98.5 F (36.9 C) (Oral)  Resp 20  Ht 6\' 4"  (1.93 m)  Wt 227 lb (102.967 kg)  BMI 27.64 kg/m2  SpO2 100% Physical Exam  Nursing note and vitals reviewed. Constitutional: He is oriented to person, place, and time. He appears well-developed and well-nourished.  HENT:  Head: Normocephalic.  Cardiovascular: Normal rate and regular rhythm.   Pulmonary/Chest: Effort normal and breath sounds normal. No respiratory distress. He has no wheezes. He has no rales.  Abdominal: Soft.  Musculoskeletal:  Near full passive range of motion of the right leg and hip. There is some tenderness to the lateral superior part of the hip. No gross deformity. Skin normal without redness, bruising or increased warmth.  Neurological: He is alert and oriented to person, place, and time.  Skin: Skin is warm and dry.  Psychiatric: He has a normal mood and affect. His behavior is normal.    ED Course  Procedures   COORDINATION OF  CARE:  Nursing notes reviewed. Vital signs reviewed. Initial pt interview and examination performed.   Filed Vitals:   04/25/14 0438  BP: 203/115  Pulse: 83  Temp: 98.5 F (36.9 C)  TempSrc: Oral  Resp: 20  Height: 6\' 4"  (1.93 m)  Weight: 227 lb (102.967 kg)  SpO2: 100%    5:05 AM-patient seen and evaluated. Patient appears well in no acute distress. Has history of some similar chronic pains in the past. Also has history of factor VIII clotting disorder.  Pt discussed in sign out with Claiborne Billings  Humes PA-C.  She will follow MRI results.   Imaging Review Dg Lumbar Spine Complete  04/25/2014   CLINICAL DATA:  New onset low back pain radiating to right hip. No trauma.  EXAM: LUMBAR SPINE - COMPLETE 4+ VIEW  COMPARISON:  Lumbar spine MRI 05/28/2004  FINDINGS: Remote L1 compression fracture, unchanged from at least 2005. No evidence of acute fracture or subluxation.  Advanced degenerative disc narrowing and endplate osteophytosis at L4-5 and L5-S1. Degenerative facet overgrowth also notable at the same levels. Osteopenia. Extensive abdominal aortic and iliac atherosclerosis.  IMPRESSION: 1. No acute osseous findings. 2. Remote L1 compression fracture. 3. Advanced degenerative disc and facet disease at L4-5 and L5-S1.   Electronically Signed   By: Jorje Guild M.D.   On: 04/25/2014 05:50     EKG Interpretation None      MDM   Final diagnoses:  None        Martie Lee, PA-C 04/25/14 [phone]

## 2014-04-25 NOTE — ED Provider Notes (Signed)
0600 - Patient care assumed from [facility], PA-C at shift change. MR pending for further evaluation of back pain. Patient with hx of acquired factor 8. Concern today is that pain is secondary to bleeding given medical hx. Plan to f/u on MR imaging and discharge if no emergent process.  0830 - MR lumbar spine today shows no evidence of bleeding of compression. Imaging is stable compared to prior. Have reviewed findings with patient who verbalizes understanding. He is stable and appropriate for discharge with instruction to followup with his primary care provider. Patient already has 5 mg oxycodone tablets for pain control. Have advised that he may take 2 of these tablets every 8 hours, rather than 1 tablet every 6 hours, if pain continues to persist. Return precautions provided and patient agreeable to plan with no unaddressed concerns.  Dg Lumbar Spine Complete  04/25/2014   CLINICAL DATA:  New onset low back pain radiating to right hip. No trauma.  EXAM: LUMBAR SPINE - COMPLETE 4+ VIEW  COMPARISON:  Lumbar spine MRI 05/28/2004  FINDINGS: Remote L1 compression fracture, unchanged from at least 2005. No evidence of acute fracture or subluxation.  Advanced degenerative disc narrowing and endplate osteophytosis at L4-5 and L5-S1. Degenerative facet overgrowth also notable at the same levels. Osteopenia. Extensive abdominal aortic and iliac atherosclerosis.  IMPRESSION: 1. No acute osseous findings. 2. Remote L1 compression fracture. 3. Advanced degenerative disc and facet disease at L4-5 and L5-S1.   Electronically Signed   By: Jorje Guild M.D.   On: 04/25/2014 05:50   Mr Lumbar Spine Wo Contrast  04/25/2014   CLINICAL DATA:  Low back pain radiating to the right hip and leg. History of prior surgery x4.  EXAM: MRI LUMBAR SPINE WITHOUT CONTRAST  TECHNIQUE: Multiplanar, multisequence MR imaging of the lumbar spine was performed. No intravenous contrast was administered.  COMPARISON:  Plain films lumbar spine  04/25/2014. MRI lumbar spine 05/28/2004.  FINDINGS: The patient has a mild common remote anterior, superior endplate compression fracture of L1 which is unchanged in appearance. Vertebral body height is otherwise maintained. 0.5 cm retrolisthesis L5 on S1 is unchanged. The conus medullaris is normal in signal and position. Imaged intra-abdominal contents demonstrate T2 hyperintense lesions in the kidneys most consistent with cysts.  T12-L1:  Negative.  L1-2:  Negative.  L2-3:  Negative.  L3-4: The patient has a small left lateral recess and foraminal protrusion. There is some facet arthropathy and ligamentum flavum thickening. There is mild narrowing in the left lateral recess and moderate left foraminal narrowing. The appearance is unchanged. The right foramen is open.  L4-5: Left laminotomy defect is again seen. Small central and left paracentral protrusion is identified. The thecal sac is widely patent. There is narrowing in the lateral recesses, more notable on the left. Moderate to moderately severe foraminal narrowing is worse on the right. The appearance of this level is not markedly changed.  L5-S1: Left laminotomy defect is again seen. Shallow disc and endplate spur are identified. The thecal sac is widely patent. Neural foramina are open.  IMPRESSION: No marked change in the appearance of the lumbar spine.  Left worse than right lateral recess narrowing L4-5 where the patient is status post left laminotomy. Moderate to moderately severe foraminal narrowing is worse on the right.  Mild to moderate central canal narrowing L3-4 where there is also left lateral recess and foraminal narrowing.  Remote L1 compression fracture.  No new fracture is identified.   Electronically Signed  By: Inge Rise M.D.   On: 04/25/2014 08:09      Antonietta Breach, PA-C 04/25/14 [id number]

## 2014-04-25 NOTE — ED Notes (Signed)
Patient still in MRI, will assess pain when he returns.

## 2014-04-25 NOTE — ED Provider Notes (Signed)
Medical screening examination/treatment/procedure(s) were performed by non-physician practitioner and as supervising physician I was immediately available for consultation/collaboration.   Ollin Hochmuth, MD 04/25/14 0645 

## 2014-04-25 NOTE — ED Notes (Signed)
Patient transported to MRI 

## 2014-04-26 ENCOUNTER — Encounter: Payer: Self-pay | Admitting: Oncology

## 2014-04-27 ENCOUNTER — Ambulatory Visit (INDEPENDENT_AMBULATORY_CARE_PROVIDER_SITE_OTHER): Payer: Medicare Other | Admitting: Family Medicine

## 2014-04-27 ENCOUNTER — Encounter: Payer: Self-pay | Admitting: Family Medicine

## 2014-04-27 VITALS — BP 158/96 | HR 104 | Wt 224.0 lb

## 2014-04-27 DIAGNOSIS — M549 Dorsalgia, unspecified: Secondary | ICD-10-CM

## 2014-04-27 DIAGNOSIS — M5416 Radiculopathy, lumbar region: Secondary | ICD-10-CM

## 2014-04-27 DIAGNOSIS — IMO0002 Reserved for concepts with insufficient information to code with codable children: Secondary | ICD-10-CM

## 2014-04-27 MED ORDER — GABAPENTIN 300 MG PO CAPS: ORAL_CAPSULE | ORAL | Status: DC

## 2014-04-27 MED ORDER — METHYLPREDNISOLONE ACETATE 80 MG/ML IJ SUSP
80.0000 mg | Freq: Once | INTRAMUSCULAR | Status: AC
Start: 2014-04-27 — End: 2014-04-27
  Administered 2014-04-27: 80 mg via INTRAMUSCULAR

## 2014-04-27 MED ORDER — KETOROLAC TROMETHAMINE 60 MG/2ML IM SOLN
60.0000 mg | Freq: Once | INTRAMUSCULAR | Status: AC
  Administered 2014-04-27: 60 mg via INTRAMUSCULAR

## 2014-04-27 NOTE — Assessment & Plan Note (Signed)
Patient is having severe nerve root impingement on the right side. Patient's MRI does correspond very well to I nerve root impingement. I do believe that the L4-L5 nerve root is impinged. We decided to do though is try IMinjection of Depo-Medrol as well as Toradol for pain relief. We will start gabapentin. Patient still has pain medications from another provider. Patient and will come back in 5 days. Patient continues to have pain we may need to consider epidural steroid injections.

## 2014-04-27 NOTE — Patient Instructions (Addendum)
Nice to meet you You have had a lot of back surgeries and I do think you have a pinched nerve based on your MRI that is giving you your symptoms Ice 20 minutes 3 times a day Start the exercises in 72 hours.  2 injections today will help with the pain Gabapentin at night.  Vitamin D over the counter 2000 IU daily could help.  Coem back in 5-7 days to make sure you are better and we can discuss epidural injections.

## 2014-04-27 NOTE — Progress Notes (Signed)
  [facility] [place], [place] [address] Phone: [phone] Subjective:      CC: bac kpain  NID:[id number] Richard Hopkins is a 68 y.o. male coming in with complaint of back pain. Patient has had back pain for multiple years. Patient does have a past medical history significant for multiple laminotomy of the lumbar spine. Patient unfortunately is having chronic pain of the back that is now sharp and aching and seems to be constant. Patient actually rates the pain as 8/10 in severity all all times and does keep him up at night. Patient is also having significant radiation down the right leg. Patient states that this states on the anterior lateral aspect of the leg and usually stops around the knee. Patient also states that he is notice weakness and is now ambulating with the aid of a cane. Patient unfortunately was in the emergency department 2 days ago due to the significant amount of pain he was having. Due to patient's past medical history the emergency department they did a MRI of the lumbar spine. MRI was reviewed by me. The most clear in aspect of the MRI shows the patient does have L4-L5 moderate to severe foraminal narrowing on the right. Patient also has significant other post surgical changes. Patient states this pain is worse than prior surgery.     Past medical history, social, surgical and family history all reviewed in electronic medical record.   Review of Systems: No headache, visual changes, nausea, vomiting, diarrhea, constipation, dizziness, abdominal pain, skin rash, fevers, chills, night sweats, weight loss, swollen lymph nodes, body aches, joint swelling, muscle aches, chest pain, shortness of breath, mood changes.   Objective Blood pressure 158/96, pulse 104, weight 224 lb (101.606 kg), SpO2 94.00%.  General: No apparent distress alert and oriented x3 mood and affect normal, dressed appropriately. Patient though is in considerable  amount pain. HEENT: Pupils equal, extraocular movements intact  Respiratory: Patient's speak in full sentences and does not appear short of breath  Cardiovascular: No lower extremity edema, non tender, no erythema  Skin: Warm dry intact with no signs of infection or rash on extremities or on axial skeleton.  Abdomen: Soft nontender  Neuro: Cranial nerves II through XII are intact, neurovascularly intact in all extremities with 2+ DTRs and 2+ pulses.  Lymph: No lymphadenopathy of posterior or anterior cervical chain or axillae bilaterally.  Gait antalgic gait  MSK:  Non tender with full range of motion and good stability and symmetric strength and tone of shoulders, elbows, wrist, hip, knee and ankles bilaterally.  Back Exam:  Inspection: Unremarkable  Motion: Flexion 25 deg, Extension 25 deg, Side Bending to 25 deg bilaterally,  Rotation to 25 deg bilaterally  SLR laying: Positive right XSLR laying: Negative  Palpable tenderness: Tender to palpation of the L4-L5 paraspinal musculature on the right. FABER: Mildly positive right Sensory change: Gross sensation intact to all lumbar and sacral dermatomes.  Reflexes: 2+ at both patellar tendons, 2+ at achilles tendons, Babinski's downgoing.  Strength at foot  Plantar-flexion: 5/5 Dorsi-flexion: 5/5 Eversion: 5/5 Inversion: 5/5  Leg strength  Quad: 4/5 Hamstring: 4/5 Hip flexor: 4/5 Hip abductors: 4/5 on the right compared to the left     Impression and Recommendations:     This case required medical decision making of moderate complexity.

## 2014-05-02 ENCOUNTER — Encounter: Payer: Self-pay | Admitting: Family Medicine

## 2014-05-02 ENCOUNTER — Ambulatory Visit (INDEPENDENT_AMBULATORY_CARE_PROVIDER_SITE_OTHER): Payer: Medicare Other | Admitting: Family Medicine

## 2014-05-02 VITALS — BP 146/90 | HR 100

## 2014-05-02 DIAGNOSIS — M5416 Radiculopathy, lumbar region: Secondary | ICD-10-CM

## 2014-05-02 DIAGNOSIS — IMO0002 Reserved for concepts with insufficient information to code with codable children: Secondary | ICD-10-CM

## 2014-05-02 MED ORDER — TRAMADOL HCL 50 MG PO TABS: 50.0000 mg | ORAL_TABLET | Freq: Three times a day (TID) | ORAL | Status: DC | PRN

## 2014-05-02 NOTE — Assessment & Plan Note (Signed)
Patient does have significant nerve root impingement I think on the right side from foraminal narrowing. Patient does have significant past medical history significant for back surgeries. Patient is having significant amount of pain numbness and weakness with radiation down his leg. I do believe the patient is a candidate for an epidural injection and will be ordered today. Patient will have this epidural as well as start gabapentin which he did not start after her last visit. Patient also given some tramadol for breakthrough pain. Patient will try these different modalities and come back to 1-2 weeks after the epidural injection for further discussion. Unfortunately with patient is continuing to have pain after the epidural we may need to send for surgical consultation for potential fusion.  Spent greater than 25 minutes with patient face-to-face and had greater than 50% of counseling including as described above in assessment and plan.

## 2014-05-02 NOTE — Patient Instructions (Signed)
Good to see you We will try an epidural and [facility] will be calling you to schedule. Gabapentin 300mg  at night.  Sent to your pharmacy Also tramadol 50mg  up to 3 times a day for breakthrough pain.  Come back 1-2 weeks after injection so I know you are getting better.

## 2014-05-02 NOTE — Progress Notes (Signed)
  [facility] [place],  [address] Phone: [phone] Subjective:      CC: back pain follow up  [id number] Richard Hopkins is a 68 y.o. male coming in with complaint of back pain here for follow up. Patient does have a history of multiple back surgeries and laminotomies. Patient did have an MRI showing that he did have severe foraminal narrowing at the L4-L5 area on the right side. Patient was having radiculopathy corresponding to the L5 nerve root impingement on the right side. Patient was given a shot of an anti-inflammatory as well as Depo-Medrol. Patient states that he only had maybe 2-5% of improvement. Patient states that the pain now seems to be getting worse again. This is causing him to continue to him male with a cane and states that he is noticing some weakness as well as significant numbness. Patient states that the radiation down his leg now is constant. Patient states it is affecting all activities today living and making it difficult to sleep.     Past medical history, social, surgical and family history all reviewed in electronic medical record.   Review of Systems: No headache, visual changes, nausea, vomiting, diarrhea, constipation, dizziness, abdominal pain, skin rash, fevers, chills, night sweats, weight loss, swollen lymph nodes, body aches, joint swelling, muscle aches, chest pain, shortness of breath, mood changes.   Objective Blood pressure 146/90, pulse 100, SpO2 95.00%.  General: No apparent distress alert and oriented x3 mood and affect normal, dressed appropriately. Patient though is in considerable amount pain. HEENT: Pupils equal, extraocular movements intact  Respiratory: Patient's speak in full sentences and does not appear short of breath  Cardiovascular: No lower extremity edema, non tender, no erythema  Skin: Warm dry intact with no signs of infection or rash on extremities or on axial skeleton.    Abdomen: Soft nontender  Neuro: Cranial nerves II through XII are intact, neurovascularly intact in all extremities with 2+ DTRs and 2+ pulses.  Lymph: No lymphadenopathy of posterior or anterior cervical chain or axillae bilaterally.  Gait antalgic gait  MSK:  Non tender with full range of motion and good stability and symmetric strength and tone of shoulders, elbows, wrist, hip, knee and ankles bilaterally.  Back Exam:  Inspection: Unremarkable  Motion: Flexion 25 deg, Extension 25 deg, Side Bending to 25 deg bilaterally,  Rotation to 25 deg bilaterally  SLR laying: Positive right XSLR laying: Negative  Palpable tenderness: Tender to palpation of the L4-L5 paraspinal musculature on the right. FABER: Positive positive right Sensory change: Gross sensation intact to all lumbar and sacral dermatomes.  Reflexes: 2+ at both patellar tendons, 2+ at achilles tendons, Babinski's downgoing.  Strength at foot  Plantar-flexion: 5/5 Dorsi-flexion: 5/5 Eversion: 5/5 Inversion: 5/5  Leg strength  Quad: 4/5 Hamstring: 4/5 Hip flexor: 4/5 Hip abductors: 4/5 on the right compared to the left No significant change from previous exam.    Impression and Recommendations:     This case required medical decision making of moderate complexity.

## 2014-05-03 ENCOUNTER — Emergency Department (HOSPITAL_COMMUNITY): Payer: Medicare Other

## 2014-05-03 ENCOUNTER — Encounter (HOSPITAL_COMMUNITY): Payer: Self-pay | Admitting: Emergency Medicine

## 2014-05-03 ENCOUNTER — Emergency Department (HOSPITAL_COMMUNITY)
Admission: EM | Admit: 2014-05-03 | Discharge: 2014-05-03 | Disposition: A | Discharge status: Home / Self Care | Payer: Medicare Other | Attending: Emergency Medicine | Admitting: Emergency Medicine

## 2014-05-03 DIAGNOSIS — Z87448 Personal history of other diseases of urinary system: Secondary | ICD-10-CM | POA: Insufficient documentation

## 2014-05-03 DIAGNOSIS — F17200 Nicotine dependence, unspecified, uncomplicated: Secondary | ICD-10-CM | POA: Insufficient documentation

## 2014-05-03 DIAGNOSIS — Z860100 Personal history of colon polyps, unspecified: Secondary | ICD-10-CM | POA: Insufficient documentation

## 2014-05-03 DIAGNOSIS — J449 Chronic obstructive pulmonary disease, unspecified: Secondary | ICD-10-CM | POA: Insufficient documentation

## 2014-05-03 DIAGNOSIS — K228 Other specified diseases of esophagus: Secondary | ICD-10-CM | POA: Insufficient documentation

## 2014-05-03 DIAGNOSIS — Z79899 Other long term (current) drug therapy: Secondary | ICD-10-CM | POA: Insufficient documentation

## 2014-05-03 DIAGNOSIS — E785 Hyperlipidemia, unspecified: Secondary | ICD-10-CM | POA: Insufficient documentation

## 2014-05-03 DIAGNOSIS — IMO0002 Reserved for concepts with insufficient information to code with codable children: Secondary | ICD-10-CM | POA: Insufficient documentation

## 2014-05-03 DIAGNOSIS — Z8619 Personal history of other infectious and parasitic diseases: Secondary | ICD-10-CM | POA: Insufficient documentation

## 2014-05-03 DIAGNOSIS — J4489 Other specified chronic obstructive pulmonary disease: Secondary | ICD-10-CM | POA: Insufficient documentation

## 2014-05-03 DIAGNOSIS — K219 Gastro-esophageal reflux disease without esophagitis: Secondary | ICD-10-CM | POA: Insufficient documentation

## 2014-05-03 DIAGNOSIS — Z8669 Personal history of other diseases of the nervous system and sense organs: Secondary | ICD-10-CM | POA: Insufficient documentation

## 2014-05-03 DIAGNOSIS — Z872 Personal history of diseases of the skin and subcutaneous tissue: Secondary | ICD-10-CM | POA: Insufficient documentation

## 2014-05-03 DIAGNOSIS — K2289 Other specified disease of esophagus: Secondary | ICD-10-CM

## 2014-05-03 DIAGNOSIS — Z8739 Personal history of other diseases of the musculoskeletal system and connective tissue: Secondary | ICD-10-CM | POA: Insufficient documentation

## 2014-05-03 DIAGNOSIS — K224 Dyskinesia of esophagus: Secondary | ICD-10-CM

## 2014-05-03 DIAGNOSIS — I10 Essential (primary) hypertension: Secondary | ICD-10-CM | POA: Insufficient documentation

## 2014-05-03 DIAGNOSIS — E119 Type 2 diabetes mellitus without complications: Secondary | ICD-10-CM | POA: Insufficient documentation

## 2014-05-03 LAB — COMPREHENSIVE METABOLIC PANEL
ALT: 12 U/L (ref 0–53)
AST: 15 U/L (ref 0–37)
Albumin: 3.4 g/dL — ABNORMAL LOW (ref 3.5–5.2)
Alkaline Phosphatase: 60 U/L (ref 39–117)
BILIRUBIN TOTAL: 0.2 mg/dL — AB (ref 0.3–1.2)
BUN: 16 mg/dL (ref 6–23)
CHLORIDE: 106 meq/L (ref 96–112)
CO2: 26 meq/L (ref 19–32)
CREATININE: 1.01 mg/dL (ref 0.50–1.35)
Calcium: 9.2 mg/dL (ref 8.4–10.5)
GFR, EST AFRICAN AMERICAN: 85 mL/min — AB (ref >90)
GFR, EST NON AFRICAN AMERICAN: 73 mL/min — AB (ref >90)
GLUCOSE: 110 mg/dL — AB (ref 70–99)
Potassium: 3.6 mEq/L — ABNORMAL LOW (ref 3.7–5.3)
Sodium: 145 mEq/L (ref 137–147)
Total Protein: 6.5 g/dL (ref 6.0–8.3)

## 2014-05-03 LAB — CBC
HEMATOCRIT: 44.2 % (ref 39.0–52.0)
HEMOGLOBIN: 14.1 g/dL (ref 13.0–17.0)
MCH: 28 pg (ref 26.0–34.0)
MCHC: 31.9 g/dL (ref 30.0–36.0)
MCV: 87.7 fL (ref 78.0–100.0)
Platelets: 259 10*3/uL (ref 150–400)
RBC: 5.04 MIL/uL (ref 4.22–5.81)
RDW: 14.3 % (ref 11.5–15.5)
WBC: 8.2 10*3/uL (ref 4.0–10.5)

## 2014-05-03 LAB — TROPONIN I: Troponin I: <0.3 ng/mL (ref <0.30)

## 2014-05-03 MED ORDER — DILTIAZEM HCL 30 MG PO TABS: 30.0000 mg | ORAL_TABLET | Freq: Three times a day (TID) | ORAL | Status: DC

## 2014-05-03 MED ORDER — GI COCKTAIL ~~LOC~~
30.0000 mL | Freq: Once | ORAL | Status: AC
  Administered 2014-05-03: 30 mL via ORAL
  Filled 2014-05-03: qty 30

## 2014-05-03 MED ORDER — GI COCKTAIL ~~LOC~~: 30.0000 mL | Freq: Once | ORAL | Status: DC

## 2014-05-03 MED ORDER — MORPHINE SULFATE 4 MG/ML IJ SOLN
4.0000 mg | Freq: Once | INTRAMUSCULAR | Status: AC
  Administered 2014-05-03: 4 mg via INTRAVENOUS
  Filled 2014-05-03: qty 1

## 2014-05-03 MED ORDER — ONDANSETRON HCL 4 MG/2ML IJ SOLN
4.0000 mg | Freq: Once | INTRAMUSCULAR | Status: AC
  Administered 2014-05-03: 4 mg via INTRAVENOUS
  Filled 2014-05-03: qty 2

## 2014-05-03 MED ORDER — SODIUM CHLORIDE 0.9 % IV BOLUS (SEPSIS)
1000.0000 mL | Freq: Once | INTRAVENOUS | Status: AC
  Administered 2014-05-03: 1000 mL via INTRAVENOUS

## 2014-05-03 MED ORDER — DEXAMETHASONE SODIUM PHOSPHATE 10 MG/ML IJ SOLN
10.0000 mg | Freq: Once | INTRAMUSCULAR | Status: AC
  Administered 2014-05-03: 10 mg via INTRAVENOUS
  Filled 2014-05-03: qty 1

## 2014-05-03 NOTE — ED Notes (Signed)
Pt states he came to the hospital, "felt like I could not swallow, a choking sensation".  Pt is alert and oriented.  Breathing normal and controlling saliva and secretions, no choking issues present.

## 2014-05-03 NOTE — Discharge Instructions (Signed)
Esophageal Spasm  Esophageal spasm is an uncoordinated contraction of the muscles of the esophagus (the tube which carries food from your mouth to your stomach). Normally, the muscles of the esophagus alternate between contraction and relaxation starting from the top of the esophagus and working down to the bottom. This moves the food from the mouth to the stomach. In esophageal spasm, all the muscles contract at once. This causes pain and fails to move the food along. As a result, you may have trouble swallowing.   Women are more likely than men to have esophageal spasm. The cause of the spasms is not known. Sometimes eating hot or cold foods triggers the condition and this may be due to an overly sensitive esophagus. This is not an infectious disease and cannot be passed to others.  SYMPTOMS   Symptoms of esophageal spasm may include: chest pain, burning or pain with swallowing, and difficulty swallowing.   DIAGNOSIS   Esophageal spasm can be diagnosed by a test called manometry (pressure studies of the esophagus). In this test, a special tube is inserted down the esophagus. The tube measures the muscle activity of the esophagus. Abnormal contractions mixed with normal movement helps confirm the diagnosis.   A person with a hypersensitive esophagus may be diagnosed by inflating a long balloon in the person's esophagus. If this causes the same symptoms, preventive methods may work.  PREVENTION   Avoid hot or cold foods if that seems to be a trigger.  PROGNOSIS   This condition does not go away, nor is treatment entirely satisfactory. Patients need to be careful of what they eat. They need to continue on medication if a useful one is found. Fortunately, the condition does not get progressively worse as time passes.  Esophageal spasm does not usually lead to more serious problems but sometimes the pain can be disabling. If a person becomes afraid to eat they may become malnourished and lose weight.   TREATMENT   · A  procedure in which instruments of increasing size are inserted through the esophagus to enlarge (dilate) it are used.  · Medications that decrease acid-production of the stomach may be used such as proton-pump inhibitors or H2-blockers.  · Medications of several types can be used to relax the muscles of the esophagus.  · An individual with a hypersensitive esophagus sometimes improves with low doses of medications normally used for depression.  · No treatment for esophageal spasm is effective for everyone. Often several approaches will be tried before one works. In many cases, the symptoms will improve, but will not go away completely.  · For severe cases, relief is obtained two-thirds of the time by cutting the muscles along the entire length of the esophagus. This is a major surgical procedure.  · Your symptoms are usually the best guide to how well the treatment for esophageal spasm works.  SIDE EFFECTS OF TREATMENTS  · Nitrates can cause headaches and low blood pressure.  · Calcium channel blockers can cause:  · Feeling sick to your stomach (nausea).  · Constipation and other side effects.  · Antidepressants can cause side effects that depend on the medication used.  HOME CARE INSTRUCTIONS   · Let your caregiver know if problems are getting worse, or if you get food stuck in your esophagus for longer than 1 hour or as directed and are unable to swallow liquid.  · Take medications as directed and with permission of your caregiver. Ask about what to do if a   medication seems to get stuck in your esophagus. Only take over-the-counter or prescription medicines for pain, discomfort, or fever as directed by your caregiver.  · Soft and liquid foods pass more easily than solid pieces.  SEEK IMMEDIATE MEDICAL CARE IF:   · You develop severe chest pain, especially if the pain is crushing or pressure-like and spreads to the arms, back, neck, or jaw, or if you have sweating, nausea, or shortness of breath. THIS COULD BE AN  EMERGENCY. Do not wait to see if the pain will go away. Get medical help at once. Call 911 or 0 (operator). DO NOT drive yourself to the hospital.  · Your chest pain gets worse and does not go away with rest.  · You have an attack of chest pain lasting longer than usual despite rest and treatment with the medications your physician has prescribed.  · You wake from sleep with chest pain or shortness of breath.  · You feel dizzy or faint.  · You have chest pain, not typical of your usual pain, caused by your esophagus for which you originally saw your caregiver.  MAKE SURE YOU:   · Understand these instructions.  · Will watch your condition.  · Will get help right away if you are not doing well or get worse.  Document Released: 02/28/2003 Document Revised: 03/01/2012 Document Reviewed: 09/12/2008  [employer]® Patient Information ©2014 [employer].

## 2014-05-03 NOTE — ED Notes (Signed)
Pt assisted up to BR to void await endo

## 2014-05-03 NOTE — ED Notes (Signed)
Dr Wilson Singer into speak to pt

## 2014-05-03 NOTE — ED Notes (Signed)
Patient reports he is having a sensation of choking that has been present for 3 days. Patient was evaluated here earlier in the week for symptoms. Patient has hx of acid reflux and states he can't take his medicines because "they come right back up." Patient is denying chest pain, but is having pain in his esophagus. Airway intact. Patient is talking in clear sentences. VSS.

## 2014-05-03 NOTE — ED Notes (Signed)
Back from xray pt sleeping

## 2014-05-03 NOTE — ED Notes (Signed)
Phlebotomy at bedside.

## 2014-05-03 NOTE — ED Notes (Signed)
Patient had PO challenge with MD present. Pt held fluids down

## 2014-05-03 NOTE — ED Provider Notes (Signed)
CSN: [id number]     Arrival date & time 05/03/14  0148 History   First MD Initiated Contact with Patient 05/03/14 0150     Chief Complaint  Patient presents with  . Sensation of choking   . Gastrophageal Reflux       The history is provided by the patient and medical records.   Patient reports a sensation of difficulty swallowing over the past 2-3 days.  He hashad an endoscopy but never had an esophageal food impaction.  He does not know any history of esophageal strictures but looking through his past medical history someone in the past as documented esophageal stricture is initiated.  Dr. Maurene Capes is his GI specialist.  He states that he intermittently throws food and liquids back.  Some of it stays down and so it comes back up.  He feels a burning sensation in his anterior chest.  He denies radiation of his discomfort.  No shortness of breath.  No recent cough or congestion.  No fevers or chills.  He's been trying Prilosec at home without improvement and he reports that much of this is because the Prilosec we'll not see them   Past Medical History  Diagnosis Date  . THRUSH   . ADENOMATOUS COLONIC POLYP   . DIABETES MELLITUS, TYPE II   . HYPERLIPIDEMIA   . SMOKER   . BLURRED VISION   . HYPERTENSION   . ALLERGIC RHINITIS   . CHRONIC OBSTRUCTIVE PULMONARY DISEASE, ACUTE EXACERBATION   . COPD   . PULMONARY FIBROSIS   . ESOPHAGEAL STRICTURE   . GERD   . DIVERTICULAR DISEASE   . BENIGN PROSTATIC HYPERTROPHY   . Acute prostatitis   . EPIDIDYMO-ORCHITIS   . SKIN LESION   . LUMBAR DISC DISORDER   . Pain in soft tissues of limb   . MUSCLE CRAMPS   . FATIGUE   . MASS, SUPERFICIAL   . DYSPNEA   . Wheezing   . DYSPNEA/SHORTNESS OF BREATH   . Nocturia   . PSA, INCREASED   . ABNORMAL ELECTROCARDIOGRAM   . ERECTILE DYSFUNCTION, ORGANIC, HX OF   . SYNCOPE   . Brain tumor   . COPD (chronic obstructive pulmonary disease)   . Chronic steroid use    Past Surgical History  Procedure  Laterality Date  . Back surgery      x 4 disabled after 2003  . Elbow arthroscopy     Family History  Problem Relation Age of Onset  . Diabetes Mother   . Colon cancer Neg Hx   . Asthma Mother   . Emphysema Mother     smoker   History  Substance Use Topics  . Smoking status: Current Some Day Smoker -- 1.00 packs/day for 50 years    Types: Cigarettes    Last Attempt to Quit: 03/07/2013  . Smokeless tobacco: Never Used  . Alcohol Use: Yes    Review of Systems  All other systems reviewed and are negative.     Allergies  Ace inhibitors; Cardura; Doxycycline; Hydrocodone; Lisinopril; and Alfuzosin  Home Medications   Prior to Admission medications   Medication Sig Start Date End Date Taking? Authorizing Provider  budesonide-formoterol (SYMBICORT) 160-4.5 MCG/ACT inhaler Inhale 2 puffs into the lungs 2 (two) times daily. 12/29/12  Yes Tanda Rockers, MD  cloNIDine (CATAPRES) 0.1 MG tablet Take 1 tablet (0.1 mg total) by mouth 3 (three) times daily. 02/21/14  Yes Janece Canterbury, MD  diphenhydramine-acetaminophen (TYLENOL PM) 25-500 MG TABS  Take 2 tablets by mouth at bedtime as needed (sleep and pain).   Yes Historical Provider, MD  furosemide (LASIX) 20 MG tablet Take 20 mg by mouth daily. 03/06/14  Yes Ladell Pier, MD  gabapentin (NEURONTIN) 300 MG capsule Take 300 mg by mouth at bedtime.   Yes Historical Provider, MD  ipratropium-albuterol (DUONEB) 0.5-2.5 (3) MG/3ML SOLN Take 3 mLs by nebulization every 6 (six) hours as needed (wheezing/shortness of breath).   Yes Historical Provider, MD  losartan (COZAAR) 100 MG tablet Take 1 tablet (100 mg total) by mouth daily. 04/20/14  Yes Biagio Borg, MD  lovastatin (MEVACOR) 20 MG tablet Take 40 mg by mouth daily at 6 PM.   Yes Historical Provider, MD  metFORMIN (GLUCOPHAGE) 500 MG tablet Take 1 tablet (500 mg total) by mouth 2 (two) times daily with a meal. 06/13/13  Yes Biagio Borg, MD  omeprazole (PRILOSEC) 20 MG capsule Take 20 mg  by mouth every morning.   Yes Historical Provider, MD  oxyCODONE (OXY IR/ROXICODONE) 5 MG immediate release tablet Take 1 tablet (5 mg total) by mouth every 6 (six) hours as needed for severe pain. 04/20/14  Yes Biagio Borg, MD  potassium chloride SA (K-DUR,KLOR-CON) 20 MEQ tablet Take 1 tablet (20 mEq total) by mouth daily. 03/06/14  Yes Ladell Pier, MD  predniSONE (DELTASONE) 20 MG tablet Take 30 mg by mouth daily with breakfast.  02/21/14  Yes Janece Canterbury, MD  tiZANidine (ZANAFLEX) 4 MG tablet Take 1 tablet (4 mg total) by mouth every 6 (six) hours as needed for muscle spasms. 04/20/14  Yes Biagio Borg, MD  traMADol (ULTRAM) 50 MG tablet Take 1 tablet (50 mg total) by mouth every 8 (eight) hours as needed. 05/02/14  Yes Lyndal Pulley, DO   BP 180/94  Pulse 73  Temp(Src) 98.8 F (37.1 C) (Oral)  Resp 13  SpO2 95% Physical Exam  Nursing note and vitals reviewed. Constitutional: He is oriented to person, place, and time. He appears well-developed and well-nourished.  HENT:  Head: Normocephalic and atraumatic.  Eyes: EOM are normal.  Neck: Normal range of motion.  Cardiovascular: Normal rate, regular rhythm, normal heart sounds and intact distal pulses.   Pulmonary/Chest: Effort normal and breath sounds normal. No respiratory distress.  Abdominal: Soft. He exhibits no distension. There is no tenderness.  Musculoskeletal: Normal range of motion.  Neurological: He is alert and oriented to person, place, and time.  Skin: Skin is warm and dry.  Psychiatric: He has a normal mood and affect. Judgment normal.    ED Course  Procedures (including critical care time) Labs Review Labs Reviewed  COMPREHENSIVE METABOLIC PANEL - Abnormal; Notable for the following:    Potassium 3.6 (*)    Glucose, Bld 110 (*)    Albumin 3.4 (*)    Total Bilirubin 0.2 (*)    GFR calc non Af Amer 73 (*)    GFR calc Af Amer 85 (*)    All other components within normal limits  CBC  TROPONIN I     Imaging Review Dg Chest 2 View  05/03/2014   CLINICAL DATA:  Choking sensation  EXAM: CHEST  2 VIEW  COMPARISON:  02/19/2014  FINDINGS: Cardiac shadow is within normal limits. The lungs are well aerated bilaterally. Mild interstitial changes are seen bilaterally. No focal confluent infiltrate is seen. Elevation of left hemidiaphragm is again noted. There is a somewhat rounded area of increased density superimposed over the minor  fissure. This may be related to confluence of parenchymal shadows although the possibility of an acute infiltrate cannot be totally excluded. Followup films are recommended to assess for stability or resolution.  IMPRESSION: Chronic changes.  Questionable increased density overlying the minor fissure laterally on the right. This is likely related to confluence of parenchymal shadows although an early infiltrate cannot be totally excluded. Followup films are recommended.   Electronically Signed   By: Inez Catalina M.D.   On: 05/03/2014 02:55  I personally reviewed the imaging tests through PACS system I reviewed available ER/hospitalization records through the EMR    EKG Interpretation   Date/Time:  Wednesday May 03 2014 02:37:52 EDT Ventricular Rate:  80 PR Interval:  207 QRS Duration: 90 QT Interval:  403 QTC Calculation: 465 R Axis:   42 Text Interpretation:  Sinus rhythm Left atrial enlargement Posterior  infarct, old Borderline repolarization abnormality No significant change  was found Confirmed by Masyn Rostro  MD, Brealyn Baril ([id number]) on 05/03/2014 5:44:57 AM      MDM   Final diagnoses:  None    5:41 AM Patient will wait for barium esophagram in the morning.  Patient has been ill to keep some fluid down.  He's also regurgitated some of his meds.  He still has abnormal sensation in his anterior chest.  My suspicion for ACS is low.  Care to Dr Felicity Coyer, MD 05/03/14 [phone]

## 2014-05-04 ENCOUNTER — Telehealth: Payer: Self-pay | Admitting: Internal Medicine

## 2014-05-04 ENCOUNTER — Ambulatory Visit (INDEPENDENT_AMBULATORY_CARE_PROVIDER_SITE_OTHER): Payer: Medicare Other | Admitting: Gastroenterology

## 2014-05-04 ENCOUNTER — Encounter: Payer: Self-pay | Admitting: Gastroenterology

## 2014-05-04 VITALS — BP 142/92 | HR 92 | Ht 74.25 in | Wt 219.4 lb

## 2014-05-04 DIAGNOSIS — K219 Gastro-esophageal reflux disease without esophagitis: Secondary | ICD-10-CM

## 2014-05-04 DIAGNOSIS — K5900 Constipation, unspecified: Secondary | ICD-10-CM

## 2014-05-04 MED ORDER — DEXLANSOPRAZOLE 60 MG PO CPDR: 60.0000 mg | DELAYED_RELEASE_CAPSULE | Freq: Every day | ORAL | Status: DC

## 2014-05-04 NOTE — Telephone Encounter (Signed)
Patient went to ED for GERD. He is still having problems. Scheduled with Alonza Bogus, PA today at 3:00 PM.

## 2014-05-04 NOTE — Patient Instructions (Addendum)
You have been given a separate informational sheet regarding your tobacco use, the importance of quitting and local resources to help you quit.  06-22-2014 at 830 am is your follow up visit with Dr. Olevia Perches  Stop Omeprazole  We have given you samples of the following medication to take: Dexilant 60 mg, please take one capsule by mouth once daily   Please purchase Miralax over the counter and take once daily as directed  Information on GERD is below for your review _______________________________________________________________________  Diet for Gastroesophageal Reflux Disease, Adult Reflux (acid reflux) is when acid from your stomach flows up into the esophagus. When acid comes in contact with the esophagus, the acid causes irritation and soreness (inflammation) in the esophagus. When reflux happens often or so severely that it causes damage to the esophagus, it is called gastroesophageal reflux disease (GERD). Nutrition therapy can help ease the discomfort of GERD. FOODS OR DRINKS TO AVOID OR LIMIT  Smoking or chewing tobacco. Nicotine is one of the most potent stimulants to acid production in the gastrointestinal tract.  Caffeinated and decaffeinated coffee and black tea.  Regular or low-calorie carbonated beverages or energy drinks (caffeine-free carbonated beverages are allowed).   Strong spices, such as black pepper, white pepper, red pepper, cayenne, curry powder, and chili powder.  Peppermint or spearmint.  Chocolate.  High-fat foods, including meats and fried foods. Extra added fats including oils, butter, salad dressings, and nuts. Limit these to less than 8 tsp per day.  Fruits and vegetables if they are not tolerated, such as citrus fruits or tomatoes.  Alcohol.  Any food that seems to aggravate your condition. If you have questions regarding your diet, call your caregiver or a registered dietitian. OTHER THINGS THAT MAY HELP GERD INCLUDE:   Eating your meals slowly,  in a relaxed setting.  Eating 5 to 6 small meals per day instead of 3 large meals.  Eliminating food for a period of time if it causes distress.  Not lying down until 3 hours after eating a meal.  Keeping the head of your bed raised 6 to 9 inches (15 to 23 cm) by using a foam wedge or blocks under the legs of the bed. Lying flat may make symptoms worse.  Being physically active. Weight loss may be helpful in reducing reflux in overweight or obese adults.  Wear loose fitting clothing EXAMPLE MEAL PLAN This meal plan is approximately 2,000 calories based on [email or web address] meal planning guidelines. Breakfast   cup cooked oatmeal.  1 cup strawberries.  1 cup low-fat milk.  1 oz almonds. Snack  1 cup cucumber slices.  6 oz yogurt (made from low-fat or fat-free milk). Lunch  2 slice whole-wheat bread.  2 oz sliced [place].  2 tsp mayonnaise.  1 cup blueberries.  1 cup snap peas. Snack  6 whole-wheat crackers.  1 oz string cheese. Dinner   cup brown rice.  1 cup mixed veggies.  1 tsp olive oil.  3 oz grilled fish. Document Released: 12/08/2005 Document Revised: 03/01/2012 Document Reviewed: 10/24/2011 [facility] Patient Information 2014 [place], [place].

## 2014-05-04 NOTE — Progress Notes (Signed)
05/04/2014 Richard Hopkins [id number] [birth year]   History of Present Illness:  This is a pleasant 68 year old male who is known to Dr. Olevia Perches.  He had a colonoscopy in January 2014 at which time he had 5 sessile polyps removed from the ascending colon. These were tubular adenomas and it was recommended that he have a repeat colonoscopy in 5 years from that time. He also had an EGD performed in March 2014 for a CT scan that showed abnormalities in the esophagus. There were no findings on EGD to correlate with the CT scan findings. He has a history of esophageal stricture so his esophagus was dilated at the time of his March 2014 EGD. Biopsies of the GE junction showed chronically inflamed mucosa with no Barrett's.  He is currently only taking omeprazole OTC 20 mg daily. He has had multiple issues with recurrent severe acid reflux in the past.  He presents to our office today again with complaints of severe reflux.  He was in the emergency department earlier this week, 5/12 into 5/13, and was diagnosed with esophageal spasms and edema. He underwent an esophagram which showed extensive mucosal edema throughout the mid to lower esophagus with the mucosa appearing smooth and most likely related to benign process such as esophagitis or diffuse esophageal spasm. He was given pain medication in the ED along with GI cocktail.  CBC and CMP were unremarkable.  He states that his reflux symptoms are very bad at night time and he had developed a choking sensation and regurgitation. He does admit that he does eat very late at night before going to bed.  While he is here today he also mentions that he's had some issues with mild constipation recently. He has not tried anything for that at this point.   Current Medications, Allergies, Past Medical History, Past Surgical History, Family History and Social History were reviewed in [employer] record.   Physical Exam: BP 142/92  Pulse 92  Ht 6'  2.25" (1.886 m)  Wt 219 lb 6 oz (99.508 kg)  BMI 27.98 kg/m2 General: Well developed black male in no acute distress Head: Normocephalic and atraumatic Eyes:  Sclerae anicteric, conjunctiva pink  Ears: Normal auditory acuity Lungs: Clear throughout to auscultation Heart: Regular rate and rhythm Abdomen: Soft, non-distended.  Normal bowel sounds.  Non-tender. Musculoskeletal: Symmetrical with no gross deformities  Extremities: No edema  Neurological: Alert oriented x 4, grossly nonfocal Psychological:  Alert and cooperative. Normal mood and affect  Assessment and Recommendations: -Severe GERD, uncontrolled with esophagram showing findings most likely consistent with esophagitis or diffuse esophageal spasm. He will discontinue his OTC omeprazole and begin Dexilant 60 mg daily (samples and prescription given), and he will take it in the evening.  Needs to follow GERD dietary measures, particularly not eating late at night.  He was advised that if this medication is too expensive or is not covered by his insurance then to call our office back and we will try to find a different prescription medication that will be covered. He is definitely not getting good reflux control with his once daily omeprazole 20 mg OTC. -Mild constipation:  Will being Miralax daily.  *He will followup in 6-8 weeks with Dr. Olevia Perches.

## 2014-05-05 ENCOUNTER — Telehealth: Payer: Self-pay | Admitting: Internal Medicine

## 2014-05-05 DIAGNOSIS — M545 Low back pain, unspecified: Secondary | ICD-10-CM

## 2014-05-05 NOTE — Progress Notes (Signed)
Reviewed and agree.

## 2014-05-05 NOTE — Telephone Encounter (Signed)
Pt request referral to back pain specialist. Please advise.

## 2014-05-05 NOTE — Telephone Encounter (Signed)
Ok referral to ortho

## 2014-05-08 ENCOUNTER — Telehealth: Payer: Self-pay | Admitting: Gastroenterology

## 2014-05-08 ENCOUNTER — Ambulatory Visit: Payer: Medicare Other | Admitting: Oncology

## 2014-05-08 ENCOUNTER — Other Ambulatory Visit: Payer: Medicare Other

## 2014-05-08 MED ORDER — SUCRALFATE 1 GM/10ML PO SUSP: 1.0000 g | Freq: Three times a day (TID) | ORAL | Status: DC

## 2014-05-08 NOTE — Telephone Encounter (Signed)
Dexilant is not going to work in just a couple of days.  Should continue it and add carafate suspension four times per day.  Thank you,  Jess

## 2014-05-08 NOTE — Telephone Encounter (Signed)
Patient states he is still having nausea all the time and vomiting. Not any better on Dexilant Please, advise.

## 2014-05-08 NOTE — Telephone Encounter (Signed)
Spoke with patient and gave him recommendations. Rx sent to pharmacy. 

## 2014-05-09 ENCOUNTER — Other Ambulatory Visit: Payer: Self-pay | Admitting: *Deleted

## 2014-05-09 MED ORDER — SUCRALFATE 1 G PO TABS: 1.0000 g | ORAL_TABLET | Freq: Three times a day (TID) | ORAL | Status: DC

## 2014-05-09 NOTE — Telephone Encounter (Signed)
Spoke to the patient and advised him I sent a prescription for the Carafate tablets, which are cheaper than the liquid.  He is to take 1 tab and crush it between 2 spoons ( tablespoons).  He can add water to the crushed tablet and swallow the mixture.I also told him I put some samples of Dexilant at the front counter. He needs to take this longer to see any effectiveness.  I told him his insurance will not approve the [place].  I also said we cannot keep him in samples.  I told him he may need to take an alternative after the Dexilant samples are gone.

## 2014-05-10 ENCOUNTER — Ambulatory Visit: Payer: Medicare Other | Admitting: Internal Medicine

## 2014-05-17 ENCOUNTER — Telehealth: Payer: Self-pay | Admitting: Internal Medicine

## 2014-05-17 NOTE — Telephone Encounter (Signed)
Patient states he cannot eat much due to pain in his throat. He is "bringing up clear phlegm." Reports he cannot sleep due to pain in throat. He reports diarrhea x 2 today. He has noticed he is itching and has red spots on his arms and legs. He has two new medications: Dexilant and Diltiazem. Please, advise.

## 2014-05-17 NOTE — Telephone Encounter (Signed)
He should be taking Carafate as well, which is also new.  Is he taking the carafate?  If not, then he needs to discontinue the Dexilant and go back to his previous PPI and start the carafate as well.  Let me know, but he may need to be put in for an appt with Dr. Olevia Perches.  Thank you,  Jess

## 2014-05-17 NOTE — Telephone Encounter (Signed)
PLEASE CALL PT AT [phone] - CAN'T EAT WITH MEDICATION TAKING NOW

## 2014-05-18 ENCOUNTER — Telehealth: Payer: Self-pay | Admitting: Oncology

## 2014-05-18 ENCOUNTER — Other Ambulatory Visit: Payer: Self-pay | Admitting: Nurse Practitioner

## 2014-05-18 DIAGNOSIS — D68318 Other hemorrhagic disorder due to intrinsic circulating anticoagulants, antibodies, or inhibitors: Secondary | ICD-10-CM

## 2014-05-18 NOTE — Telephone Encounter (Signed)
Spoke with patient and he has not been taking the Carafate. He will start this. He will switch back to Omeprazole also.

## 2014-05-18 NOTE — Telephone Encounter (Signed)
S/w the pt and he is aware of his appts on 05/22/2014.

## 2014-05-22 ENCOUNTER — Other Ambulatory Visit (HOSPITAL_BASED_OUTPATIENT_CLINIC_OR_DEPARTMENT_OTHER): Payer: Medicare Other

## 2014-05-22 ENCOUNTER — Ambulatory Visit (HOSPITAL_BASED_OUTPATIENT_CLINIC_OR_DEPARTMENT_OTHER): Payer: Medicare Other | Admitting: Nurse Practitioner

## 2014-05-22 ENCOUNTER — Telehealth: Payer: Self-pay | Admitting: Oncology

## 2014-05-22 VITALS — BP 126/83 | HR 96 | Temp 98.3°F | Resp 18 | Ht 74.25 in | Wt 210.6 lb

## 2014-05-22 DIAGNOSIS — K219 Gastro-esophageal reflux disease without esophagitis: Secondary | ICD-10-CM

## 2014-05-22 DIAGNOSIS — D68318 Other hemorrhagic disorder due to intrinsic circulating anticoagulants, antibodies, or inhibitors: Secondary | ICD-10-CM

## 2014-05-22 DIAGNOSIS — I10 Essential (primary) hypertension: Secondary | ICD-10-CM

## 2014-05-22 DIAGNOSIS — E119 Type 2 diabetes mellitus without complications: Secondary | ICD-10-CM

## 2014-05-22 DIAGNOSIS — D500 Iron deficiency anemia secondary to blood loss (chronic): Secondary | ICD-10-CM

## 2014-05-22 NOTE — Progress Notes (Signed)
  [facility] OFFICE PROGRESS NOTE   Diagnosis:  Factor VIII inhibitor.  INTERVAL HISTORY:   Richard Hopkins returns after missing a scheduled followup appointment in May. He reports he has been dealing with "severe" reflux. He was started on Dexilant but developed a rash and generalized pruritus. He is now taking omeprazole. Symptoms are beginning to improve. He has lost weight which he attributes to being unable to eat due to the reflux symptoms.  His current prednisone dose is 20 mg daily. He denies any bruising or bleeding. Leg swelling has resolved. He is no longer taking Lasix. He reports a continued poor energy level.  Objective:  Vital signs in last 24 hours:  Blood pressure 126/83, pulse 96, temperature 98.3 F (36.8 C), temperature source Oral, resp. rate 18, height 6' 2.25" (1.886 m), weight 210 lb 9.6 oz (95.528 kg), SpO2 97.00%.    HEENT: No thrush or ulcerations. Resp: Lungs clear. Cardio: Regular cardiac rhythm. GI: Abdomen soft and nontender. No organomegaly. Vascular: No leg edema. Skin: No ecchymoses.    Lab Results:  Lab Results  Component Value Date   WBC 8.2 05/03/2014   HGB 14.1 05/03/2014   HCT 44.2 05/03/2014   MCV 87.7 05/03/2014   PLT 259 05/03/2014   NEUTROABS 9.3* 04/20/2014    Imaging:  No results found.  Medications: I have reviewed the patient's current medications.  Assessment/Plan: 1. 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  2. Bleeding secondary to #1 with extensive ecchymoses. Ecchymoses have resolved. 3. Anemia secondary to bleeding, polyphlebotomy. Improved.  4. Diabetes.  5. Hypertension.  6. COPD.  7. BPH.  8. Bilateral lower leg swelling-most likely related to prednisone. Improved with Lasix. Resolved. 9. GERD.   Disposition: He appears stable from a hematologic standpoint. We will followup on the  factor VIII level and PTT from today.  Current prednisone dose is 20 mg daily. He will decrease to 10 mg daily beginning tomorrow. He reports taking prednisone chronically at a dose of 10 mg daily prior to the factor VIII inhibitor diagnosis. He has an upcoming appointment with Dr. Jenny Reichmann. We will defer further tapering of the prednisone to Dr. Jenny Reichmann.  He has had severe reflux. Question if symptoms exacerbated by the steroids. He has noted recent improvement. He continues followup with Dr. Olevia Perches.  He will return for a followup visit in 3 months. He will contact the office in the interim with any problems. We specifically discussed bruising/bleeding.   Plan reviewed with Dr. Benay Spice.    Owens Shark ANP/GNP-BC   05/22/2014  1:13 PM

## 2014-05-22 NOTE — Telephone Encounter (Signed)
gv and pritned appts ched and avs for pt fro SEpt

## 2014-05-23 LAB — FACTOR 8 ASSAY: COAGULATION FACTOR VIII: 138 % (ref 73–140)

## 2014-05-23 LAB — APTT: aPTT: 33 seconds (ref 24–37)

## 2014-05-25 ENCOUNTER — Telehealth: Payer: Self-pay | Admitting: *Deleted

## 2014-05-25 NOTE — Telephone Encounter (Signed)
Called pt with lab results. Factor VIII is normal, per Dr. Benay Spice. Pt voiced understanding.

## 2014-05-25 NOTE — Telephone Encounter (Signed)
Message copied by Brien Few on Thu May 25, 2014 12:21 PM ------      Message from: Betsy Coder B      Created: Thu May 25, 2014  7:16 AM       Please call patient, factor 8 is normal, f/u as scheduled ------

## 2014-05-30 ENCOUNTER — Ambulatory Visit: Payer: Medicare Other | Admitting: Internal Medicine

## 2014-06-01 ENCOUNTER — Encounter: Payer: Self-pay | Admitting: Internal Medicine

## 2014-06-01 ENCOUNTER — Telehealth: Payer: Self-pay

## 2014-06-01 NOTE — Telephone Encounter (Signed)
The patient called and is hoping to get a note to return to work.  He is hoping to discuss his work situation with Shirlean Mylar when she is available.    Thanks!

## 2014-06-01 NOTE — Telephone Encounter (Signed)
The patient would like to return back to work on June 05, 2014.  States he is walking without a cane and doing much better.  Just need a work stating ok to return to work .

## 2014-06-01 NOTE — Telephone Encounter (Signed)
Letter Done hardcopy to robin

## 2014-06-01 NOTE — Telephone Encounter (Signed)
Patient informed to pickup letter at the front desk.

## 2014-06-15 ENCOUNTER — Other Ambulatory Visit: Payer: Self-pay | Admitting: Internal Medicine

## 2014-06-22 ENCOUNTER — Ambulatory Visit: Payer: Medicare Other | Admitting: Internal Medicine

## 2014-06-30 ENCOUNTER — Other Ambulatory Visit: Payer: Self-pay | Admitting: *Deleted

## 2014-06-30 DIAGNOSIS — D68318 Other hemorrhagic disorder due to intrinsic circulating anticoagulants, antibodies, or inhibitors: Secondary | ICD-10-CM

## 2014-06-30 MED ORDER — PREDNISONE 20 MG PO TABS: 30.0000 mg | ORAL_TABLET | Freq: Every day | ORAL | Status: DC

## 2014-06-30 NOTE — Telephone Encounter (Signed)
Pt states he need refill on his prednisone sent to [facility]. Inform pt will send...Richard Hopkins

## 2014-07-18 ENCOUNTER — Ambulatory Visit: Payer: Medicare Other | Admitting: Internal Medicine

## 2014-07-20 ENCOUNTER — Ambulatory Visit: Payer: Medicare Other | Admitting: Internal Medicine

## 2014-07-27 ENCOUNTER — Encounter: Payer: Self-pay | Admitting: Internal Medicine

## 2014-07-27 ENCOUNTER — Ambulatory Visit (INDEPENDENT_AMBULATORY_CARE_PROVIDER_SITE_OTHER): Payer: Medicare Other | Admitting: Internal Medicine

## 2014-07-27 VITALS — BP 152/88 | HR 87 | Temp 98.1°F | Ht 75.0 in | Wt 215.5 lb

## 2014-07-27 DIAGNOSIS — J309 Allergic rhinitis, unspecified: Secondary | ICD-10-CM

## 2014-07-27 DIAGNOSIS — E119 Type 2 diabetes mellitus without complications: Secondary | ICD-10-CM

## 2014-07-27 DIAGNOSIS — I10 Essential (primary) hypertension: Secondary | ICD-10-CM

## 2014-07-27 DIAGNOSIS — IMO0002 Reserved for concepts with insufficient information to code with codable children: Secondary | ICD-10-CM

## 2014-07-27 DIAGNOSIS — J449 Chronic obstructive pulmonary disease, unspecified: Secondary | ICD-10-CM

## 2014-07-27 MED ORDER — CETIRIZINE HCL 10 MG PO TABS: 10.0000 mg | ORAL_TABLET | Freq: Every day | ORAL | Status: DC

## 2014-07-27 MED ORDER — AMLODIPINE BESYLATE 5 MG PO TABS: 5.0000 mg | ORAL_TABLET | Freq: Every day | ORAL | Status: DC

## 2014-07-27 MED ORDER — FLUTICASONE PROPIONATE 50 MCG/ACT NA SUSP: 2.0000 | Freq: Every day | NASAL | Status: DC

## 2014-07-27 NOTE — Progress Notes (Signed)
Pre visit review using our clinic review tool, if applicable. No additional management support is needed unless otherwise documented below in the visit note. 

## 2014-07-27 NOTE — Assessment & Plan Note (Signed)
Mild elev today, but stable overall by history and exam, recent data reviewed with pt, and pt to continue medical treatment as before,  to f/u any worsening symptoms or concerns BP Readings from Last 3 Encounters:  07/27/14 152/88  05/22/14 126/83  05/04/14 142/92

## 2014-07-27 NOTE — Assessment & Plan Note (Signed)
stable overall by history and exam, recent data reviewed with pt, and pt to continue medical treatment as before,  to f/u any worsening symptoms or concerns Lab Results  Component Value Date   HGBA1C 6.4 04/20/2014

## 2014-07-27 NOTE — Progress Notes (Signed)
Subjective:    Patient ID: Richard Hopkins, male    DOB: [birth year], 67 y.o.   MRN: [id number]  HPI  Here to f/u, now on 10 mg prednisone, orig started related to persistent subjective bronchospasm, symbicort expensive, not using regularly.  Pt denies chest pain, increased sob or doe, wheezing, orthopnea, PND, increased LE swelling, palpitations, dizziness or syncope.  Mentions nasal allergy symptoms with clearish congestion, itch and sneezing, without fever, pain, ST, cough, swelling or wheezing, but gets worse off the prednisone.Did have episode of dizziness soon after starting cozaar, so stopped.  Willing to try different med  Also being tx for factor deficiency, no overt bleeding or bruising.  Past Medical History  Diagnosis Date  . THRUSH   . ADENOMATOUS COLONIC POLYP   . DIABETES MELLITUS, TYPE II   . HYPERLIPIDEMIA   . SMOKER   . BLURRED VISION   . HYPERTENSION   . ALLERGIC RHINITIS   . CHRONIC OBSTRUCTIVE PULMONARY DISEASE, ACUTE EXACERBATION   . COPD   . PULMONARY FIBROSIS   . ESOPHAGEAL STRICTURE   . GERD   . DIVERTICULAR DISEASE   . BENIGN PROSTATIC HYPERTROPHY   . Acute prostatitis   . EPIDIDYMO-ORCHITIS   . SKIN LESION   . LUMBAR DISC DISORDER   . Pain in soft tissues of limb   . MUSCLE CRAMPS   . FATIGUE   . MASS, SUPERFICIAL   . DYSPNEA   . Wheezing   . DYSPNEA/SHORTNESS OF BREATH   . Nocturia   . PSA, INCREASED   . ABNORMAL ELECTROCARDIOGRAM   . ERECTILE DYSFUNCTION, ORGANIC, HX OF   . SYNCOPE   . Brain tumor   . COPD (chronic obstructive pulmonary disease)   . Chronic steroid use    Past Surgical History  Procedure Laterality Date  . Back surgery      x 4 disabled after 2003  . Elbow arthroscopy      reports that he has been smoking Cigarettes.  He has a 50 pack-year smoking history. He has never used smokeless tobacco. He reports that he does not drink alcohol or use illicit drugs. family history includes Asthma in his mother; Diabetes in his  mother; Emphysema in his mother. There is no history of Colon cancer. Allergies  Allergen Reactions  . Dexilant [Dexlansoprazole] Itching and Rash  . Ace Inhibitors Cough  . Cardura [Doxazosin Mesylate] Other (See Comments)    dizziness  . Cozaar [Losartan Potassium] Other (See Comments)    dizzy  . Doxycycline Nausea Only and Other (See Comments)    Loss of memory; "bad feeling," dizziness  . Hydrocodone Nausea Only and Other (See Comments)    nauasea and dizziness  . Lisinopril Cough    REACTION: cough  . Alfuzosin Rash   Current Outpatient Prescriptions on File Prior to Visit  Medication Sig Dispense Refill  . budesonide-formoterol (SYMBICORT) 160-4.5 MCG/ACT inhaler Inhale 2 puffs into the lungs 2 (two) times daily.  1 Inhaler  0  . cloNIDine (CATAPRES) 0.1 MG tablet Take 1 tablet (0.1 mg total) by mouth 3 (three) times daily.  90 tablet  0  . diphenhydramine-acetaminophen (TYLENOL PM) 25-500 MG TABS Take 2 tablets by mouth at bedtime as needed (sleep and pain).      Marland Kitchen ipratropium-albuterol (DUONEB) 0.5-2.5 (3) MG/3ML SOLN Take 3 mLs by nebulization every 6 (six) hours as needed (wheezing/shortness of breath).      . lovastatin (MEVACOR) 20 MG tablet Take 40 mg by mouth  daily at 6 PM.      . metFORMIN (GLUCOPHAGE) 500 MG tablet TAKE ONE TABLET BY MOUTH TWICE DAILY WITH  A  MEAL  60 tablet  11  . potassium chloride SA (K-DUR,KLOR-CON) 20 MEQ tablet Take 1 tablet (20 mEq total) by mouth daily.  30 tablet  0  . sucralfate (CARAFATE) 1 G tablet Take 1 tablet (1 g total) by mouth 4 (four) times daily -  with meals and at bedtime.  120 tablet  1  . tiZANidine (ZANAFLEX) 4 MG tablet Take 1 tablet (4 mg total) by mouth every 6 (six) hours as needed for muscle spasms.  60 tablet  0  . traMADol (ULTRAM) 50 MG tablet Take 1 tablet (50 mg total) by mouth every 8 (eight) hours as needed.  50 tablet  2   No current facility-administered medications on file prior to visit.   Review of Systems   Constitutional: Negative for unusual diaphoresis or other sweats  HENT: Negative for ringing in ear Eyes: Negative for double vision or worsening visual disturbance.  Respiratory: Negative for choking and stridor.   Gastrointestinal: Negative for vomiting or other signifcant bowel change Genitourinary: Negative for hematuria or decreased urine volume.  Musculoskeletal: Negative for other MSK pain or swelling Skin: Negative for color change and worsening wound.  Neurological: Negative for tremors and numbness other than noted  Psychiatric/Behavioral: Negative for decreased concentration or agitation other than above       Objective:   Physical Exam BP 152/88  Pulse 87  Temp(Src) 98.1 F (36.7 C) (Oral)  Ht 6\' 3"  (1.905 m)  Wt 215 lb 8 oz (97.75 kg)  BMI 26.94 kg/m2  SpO2 96% VS noted, not ill appearing Constitutional: Pt appears well-developed, well-nourished.  HENT: Head: NCAT.  Right Ear: External ear normal.  Left Ear: External ear normal.  Eyes: . Pupils are equal, round, and reactive to light. Conjunctivae and EOM are normal Neck: Normal range of motion. Neck supple.  Cardiovascular: Normal rate and regular rhythm.   Pulmonary/Chest: Effort normal and breath sounds with small isolated wheeze right chest  Neurological: Pt is alert. Not confused , motor grossly intact Skin: Skin is warm. No rash, no LE edema Psychiatric: Pt behavior is normal. No agitation.     Assessment & Plan:

## 2014-07-27 NOTE — Assessment & Plan Note (Signed)
stable overall by history and exam, recent data reviewed with pt, and pt to continue medical treatment as before,  to f/u any worsening symptoms or concerns SpO2 Readings from Last 3 Encounters:  07/27/14 96%  05/22/14 97%  05/03/14 92%  to cont symbicort, but if too expensive consider dulera or advair

## 2014-07-27 NOTE — Patient Instructions (Addendum)
OK to stop the prednisone  Please take all new medication as prescribed  - the amlodipine 5 mg per day for blood pressure  Remember to take the symbicort on a regular basis, as this is better treatment than the prednisone  If the symbicort is expensive, please check with your pharmacist to see if Dulera or Advair would be less expensive  Please take all new medication as prescribed- the zyrtec, and flonase for the allergies  Please continue all other medications as before, and refills have been done if requested.  Please have the pharmacy call with any other refills you may need.  Please continue your efforts at being more active, low cholesterol diet, and weight control.  Please keep your appointments with your specialists as you may have planned  Please return in 6 months, or sooner if needed

## 2014-07-27 NOTE — Assessment & Plan Note (Signed)
To d/c prednisone today

## 2014-07-27 NOTE — Assessment & Plan Note (Signed)
liekly to be worse symtpomatically off prednisone, for zyrtec/flonase asd,

## 2014-07-28 ENCOUNTER — Telehealth: Payer: Self-pay | Admitting: Internal Medicine

## 2014-07-28 NOTE — Telephone Encounter (Signed)
Relevant patient education mailed to patient.  

## 2014-07-28 NOTE — Telephone Encounter (Signed)
Relevant patient education assigned to patient using Emmi. ° °

## 2014-08-08 ENCOUNTER — Telehealth: Payer: Self-pay | Admitting: *Deleted

## 2014-08-08 MED ORDER — CLONIDINE HCL 0.1 MG PO TABS: 0.1000 mg | ORAL_TABLET | Freq: Three times a day (TID) | ORAL | Status: DC

## 2014-08-08 MED ORDER — IPRATROPIUM-ALBUTEROL 0.5-2.5 (3) MG/3ML IN SOLN: 3.0000 mL | Freq: Four times a day (QID) | RESPIRATORY_TRACT | Status: DC | PRN

## 2014-08-08 MED ORDER — AMLODIPINE BESYLATE 5 MG PO TABS: 5.0000 mg | ORAL_TABLET | Freq: Every day | ORAL | Status: DC

## 2014-08-08 NOTE — Telephone Encounter (Signed)
Pt stated pharmacy been trying to get his clonidine refill but haven't received back. Verified pharmacy he need med sent to [facility], along with solution for nebulizer & amilodipine md rx @ last visit. Inform pt will send,,,/lmb

## 2014-08-10 ENCOUNTER — Emergency Department (HOSPITAL_COMMUNITY)
Admission: EM | Admit: 2014-08-10 | Discharge: 2014-08-11 | Disposition: A | Discharge status: Home / Self Care | Payer: Medicare Other | Attending: Emergency Medicine | Admitting: Emergency Medicine

## 2014-08-10 ENCOUNTER — Encounter (HOSPITAL_COMMUNITY): Payer: Self-pay | Admitting: Emergency Medicine

## 2014-08-10 ENCOUNTER — Emergency Department (HOSPITAL_COMMUNITY): Payer: Medicare Other

## 2014-08-10 DIAGNOSIS — F17200 Nicotine dependence, unspecified, uncomplicated: Secondary | ICD-10-CM | POA: Insufficient documentation

## 2014-08-10 DIAGNOSIS — Z872 Personal history of diseases of the skin and subcutaneous tissue: Secondary | ICD-10-CM | POA: Diagnosis not present

## 2014-08-10 DIAGNOSIS — R0602 Shortness of breath: Secondary | ICD-10-CM | POA: Diagnosis present

## 2014-08-10 DIAGNOSIS — E119 Type 2 diabetes mellitus without complications: Secondary | ICD-10-CM | POA: Insufficient documentation

## 2014-08-10 DIAGNOSIS — IMO0002 Reserved for concepts with insufficient information to code with codable children: Secondary | ICD-10-CM | POA: Insufficient documentation

## 2014-08-10 DIAGNOSIS — Z8619 Personal history of other infectious and parasitic diseases: Secondary | ICD-10-CM | POA: Insufficient documentation

## 2014-08-10 DIAGNOSIS — Z87448 Personal history of other diseases of urinary system: Secondary | ICD-10-CM | POA: Insufficient documentation

## 2014-08-10 DIAGNOSIS — H538 Other visual disturbances: Secondary | ICD-10-CM | POA: Diagnosis not present

## 2014-08-10 DIAGNOSIS — Z8739 Personal history of other diseases of the musculoskeletal system and connective tissue: Secondary | ICD-10-CM | POA: Insufficient documentation

## 2014-08-10 DIAGNOSIS — I10 Essential (primary) hypertension: Secondary | ICD-10-CM | POA: Diagnosis not present

## 2014-08-10 DIAGNOSIS — Z79899 Other long term (current) drug therapy: Secondary | ICD-10-CM | POA: Insufficient documentation

## 2014-08-10 DIAGNOSIS — J441 Chronic obstructive pulmonary disease with (acute) exacerbation: Secondary | ICD-10-CM | POA: Diagnosis not present

## 2014-08-10 DIAGNOSIS — E785 Hyperlipidemia, unspecified: Secondary | ICD-10-CM | POA: Diagnosis not present

## 2014-08-10 DIAGNOSIS — Z8719 Personal history of other diseases of the digestive system: Secondary | ICD-10-CM | POA: Insufficient documentation

## 2014-08-10 DIAGNOSIS — Z86011 Personal history of benign neoplasm of the brain: Secondary | ICD-10-CM | POA: Insufficient documentation

## 2014-08-10 LAB — COMPREHENSIVE METABOLIC PANEL
ALT: 17 U/L (ref 0–53)
ANION GAP: 16 — AB (ref 5–15)
AST: 20 U/L (ref 0–37)
Albumin: 3.5 g/dL (ref 3.5–5.2)
Alkaline Phosphatase: 61 U/L (ref 39–117)
BILIRUBIN TOTAL: 0.5 mg/dL (ref 0.3–1.2)
BUN: 15 mg/dL (ref 6–23)
CO2: 24 meq/L (ref 19–32)
Calcium: 9.5 mg/dL (ref 8.4–10.5)
Chloride: 102 mEq/L (ref 96–112)
Creatinine, Ser: 1.16 mg/dL (ref 0.50–1.35)
GFR calc non Af Amer: 62 mL/min — ABNORMAL LOW (ref >90)
GFR, EST AFRICAN AMERICAN: 72 mL/min — AB (ref >90)
Glucose, Bld: 105 mg/dL — ABNORMAL HIGH (ref 70–99)
POTASSIUM: 3.7 meq/L (ref 3.7–5.3)
Sodium: 142 mEq/L (ref 137–147)
TOTAL PROTEIN: 6.8 g/dL (ref 6.0–8.3)

## 2014-08-10 LAB — CBC
HEMATOCRIT: 43.8 % (ref 39.0–52.0)
Hemoglobin: 14.1 g/dL (ref 13.0–17.0)
MCH: 27.1 pg (ref 26.0–34.0)
MCHC: 32.2 g/dL (ref 30.0–36.0)
MCV: 84.2 fL (ref 78.0–100.0)
Platelets: 306 10*3/uL (ref 150–400)
RBC: 5.2 MIL/uL (ref 4.22–5.81)
RDW: 15 % (ref 11.5–15.5)
WBC: 10 10*3/uL (ref 4.0–10.5)

## 2014-08-10 LAB — I-STAT TROPONIN, ED: Troponin i, poc: 0.02 ng/mL (ref 0.00–0.08)

## 2014-08-10 LAB — PRO B NATRIURETIC PEPTIDE: PRO B NATRI PEPTIDE: 378.8 pg/mL — AB (ref 0–125)

## 2014-08-10 MED ORDER — SUCRALFATE 1 GM/10ML PO SUSP
1.0000 g | Freq: Once | ORAL | Status: AC
  Administered 2014-08-10: 1 g via ORAL
  Filled 2014-08-10: qty 10

## 2014-08-10 MED ORDER — ALBUTEROL SULFATE HFA 108 (90 BASE) MCG/ACT IN AERS
2.0000 | INHALATION_SPRAY | RESPIRATORY_TRACT | Status: DC
  Administered 2014-08-10: 2 via RESPIRATORY_TRACT
  Filled 2014-08-10: qty 6.7

## 2014-08-10 MED ORDER — IPRATROPIUM-ALBUTEROL 0.5-2.5 (3) MG/3ML IN SOLN
3.0000 mL | Freq: Once | RESPIRATORY_TRACT | Status: AC
  Administered 2014-08-10: 3 mL via RESPIRATORY_TRACT
  Filled 2014-08-10: qty 3

## 2014-08-10 MED ORDER — PREDNISONE 20 MG PO TABS
60.0000 mg | ORAL_TABLET | Freq: Once | ORAL | Status: AC
  Administered 2014-08-10: 60 mg via ORAL
  Filled 2014-08-10: qty 3

## 2014-08-10 MED ORDER — PREDNISONE 10 MG PO TABS: 60.0000 mg | ORAL_TABLET | Freq: Every day | ORAL | Status: DC

## 2014-08-10 NOTE — ED Notes (Signed)
Pt placed on monitor, neb tx started per order

## 2014-08-10 NOTE — ED Notes (Signed)
Pt presents with shortness of breath since this morning, denies CP- hx of COPD.  Lung sounds diminished/tight, wheezing noted to left side.  Pt speaking in full sentences at present.

## 2014-08-10 NOTE — Discharge Instructions (Signed)

## 2014-08-11 NOTE — ED Provider Notes (Signed)
CSN: [id number]     Arrival date & time 08/10/14  1957 History   First MD Initiated Contact with Patient 08/10/14 2051     Chief Complaint  Patient presents with  . Shortness of Breath     HPI Patient presents emergency department with increasing shortness of breath since this morning.  His had productive cough.  He denies chest pain.  He has a history of non-option dependent COPD.  He tried to take some albuterol however realize that it was out of date.  At time of evaluation the patient started received a nebulized treatment in emergency apartment reports significant improvement in his breathing.  No fevers or chills.  No unilateral leg swelling.  No history of DVT or pulmonary embolism.   Past Medical History  Diagnosis Date  . THRUSH   . ADENOMATOUS COLONIC POLYP   . DIABETES MELLITUS, TYPE II   . HYPERLIPIDEMIA   . SMOKER   . BLURRED VISION   . HYPERTENSION   . ALLERGIC RHINITIS   . CHRONIC OBSTRUCTIVE PULMONARY DISEASE, ACUTE EXACERBATION   . COPD   . PULMONARY FIBROSIS   . ESOPHAGEAL STRICTURE   . GERD   . DIVERTICULAR DISEASE   . BENIGN PROSTATIC HYPERTROPHY   . Acute prostatitis   . EPIDIDYMO-ORCHITIS   . SKIN LESION   . LUMBAR DISC DISORDER   . Pain in soft tissues of limb   . MUSCLE CRAMPS   . FATIGUE   . MASS, SUPERFICIAL   . DYSPNEA   . Wheezing   . DYSPNEA/SHORTNESS OF BREATH   . Nocturia   . PSA, INCREASED   . ABNORMAL ELECTROCARDIOGRAM   . ERECTILE DYSFUNCTION, ORGANIC, HX OF   . SYNCOPE   . Brain tumor   . COPD (chronic obstructive pulmonary disease)   . Chronic steroid use    Past Surgical History  Procedure Laterality Date  . Back surgery      x 4 disabled after 2003  . Elbow arthroscopy     Family History  Problem Relation Age of Onset  . Diabetes Mother   . Colon cancer Neg Hx   . Asthma Mother   . Emphysema Mother     smoker   History  Substance Use Topics  . Smoking status: Current Every Day Smoker -- 1.00 packs/day for 50 years     Types: Cigarettes  . Smokeless tobacco: Never Used  . Alcohol Use: No    Review of Systems  All other systems reviewed and are negative.     Allergies  Doxycycline; Ace inhibitors; Cardura; Cozaar; Dexilant; Hydrocodone; Lisinopril; and Alfuzosin  Home Medications   Prior to Admission medications   Medication Sig Start Date End Date Taking? Authorizing Provider  budesonide-formoterol (SYMBICORT) 160-4.5 MCG/ACT inhaler Inhale 2 puffs into the lungs 2 (two) times daily. 12/29/12  Yes Tanda Rockers, MD  cloNIDine (CATAPRES) 0.1 MG tablet Take 1 tablet (0.1 mg total) by mouth 3 (three) times daily. 08/08/14  Yes Biagio Borg, MD  diphenhydramine-acetaminophen (TYLENOL PM) 25-500 MG TABS Take 2 tablets by mouth at bedtime as needed (sleep and pain).   Yes Historical Provider, MD  ipratropium-albuterol (DUONEB) 0.5-2.5 (3) MG/3ML SOLN Take 3 mLs by nebulization every 6 (six) hours as needed (wheezing/shortness of breath). 08/08/14  Yes Biagio Borg, MD  lovastatin (MEVACOR) 20 MG tablet Take 40 mg by mouth daily at 6 PM.   Yes Historical Provider, MD  metFORMIN (GLUCOPHAGE) 500 MG tablet Take 1,000 mg  by mouth 2 (two) times daily with a meal.   Yes Historical Provider, MD  amLODipine (NORVASC) 5 MG tablet Take 1 tablet (5 mg total) by mouth daily. 08/08/14 08/08/15  Biagio Borg, MD  predniSONE (DELTASONE) 10 MG tablet Take 6 tablets (60 mg total) by mouth daily. 08/10/14   Hoy Morn, MD   BP 153/87  Pulse 102  Temp(Src) 98 F (36.7 C) (Oral)  Resp 20  Ht 6\' 3"  (1.905 m)  Wt 215 lb (97.523 kg)  BMI 26.87 kg/m2  SpO2 100% Physical Exam  Nursing note and vitals reviewed. Constitutional: He is oriented to person, place, and time. He appears well-developed and well-nourished.  HENT:  Head: Normocephalic and atraumatic.  Eyes: EOM are normal.  Neck: Normal range of motion.  Cardiovascular: Normal rate, regular rhythm, normal heart sounds and intact distal pulses.    Pulmonary/Chest: Effort normal. No respiratory distress. He has wheezes.  Abdominal: Soft. He exhibits no distension. There is no tenderness.  Musculoskeletal: Normal range of motion.  Neurological: He is alert and oriented to person, place, and time.  Skin: Skin is warm and dry.  Psychiatric: He has a normal mood and affect. Judgment normal.    ED Course  Procedures (including critical care time) Labs Review Labs Reviewed  COMPREHENSIVE METABOLIC PANEL - Abnormal; Notable for the following:    Glucose, Bld 105 (*)    GFR calc non Af Amer 62 (*)    GFR calc Af Amer 72 (*)    Anion gap 16 (*)    All other components within normal limits  PRO B NATRIURETIC PEPTIDE - Abnormal; Notable for the following:    Pro B Natriuretic peptide (BNP) 378.8 (*)    All other components within normal limits  CBC  I-STAT TROPOININ, ED    Imaging Review Dg Chest 2 View  08/10/2014   CLINICAL DATA:  Shortness of breath.  EXAM: CHEST  2 VIEW  COMPARISON:  05/03/2014  FINDINGS: The cardiac silhouette, mediastinal and hilar contours are within normal limits and stable. There is tortuosity and calcification of the thoracic aorta. The lungs demonstrate significant chronic lung changes with emphysema and pulmonary scarring. Stable elevation of the left hemidiaphragm. Rounded density in the right lower chest is likely a nipple shadow. No infiltrates or effusions.  IMPRESSION: Chronic lung disease with emphysema and pulmonary scarring. No definite acute overlying pulmonary process.   Electronically Signed   By: Kalman Jewels M.D.   On: 08/10/2014 22:27     EKG Interpretation   Date/Time:  Thursday August 10 2014 20:12:03 EDT Ventricular Rate:  114 PR Interval:  226 QRS Duration: 94 QT Interval:  338 QTC Calculation: 465 R Axis:   36 Text Interpretation:  Sinus tachycardia with 1st degree A-V block Possible  Left atrial enlargement Left ventricular hypertrophy REPOLARIZATION  ABNORMALITY Abnormal ECG  Confirmed by Wilson Singer  MD, STEPHEN ([id number]) on  08/10/2014 8:17:09 PM      MDM   Final diagnoses:  COPD exacerbation    Patient feels much better after albuterol and Atrovent.  Home with albuterol inhaler.  Home with steroids.  Likely COPD exacerbation.  No indication for antibiotics at this time.  Close PCP and pulmonary followup.  He understands to return to the ER for new or worsening symptoms    Hoy Morn, MD 08/11/14 [phone]

## 2014-08-19 ENCOUNTER — Emergency Department (HOSPITAL_COMMUNITY)
Admission: EM | Admit: 2014-08-19 | Discharge: 2014-08-20 | Disposition: A | Discharge status: Home / Self Care | Payer: Medicare Other | Attending: Emergency Medicine | Admitting: Emergency Medicine

## 2014-08-19 ENCOUNTER — Encounter (HOSPITAL_COMMUNITY): Payer: Self-pay | Admitting: Emergency Medicine

## 2014-08-19 DIAGNOSIS — Z79899 Other long term (current) drug therapy: Secondary | ICD-10-CM | POA: Diagnosis not present

## 2014-08-19 DIAGNOSIS — J449 Chronic obstructive pulmonary disease, unspecified: Secondary | ICD-10-CM | POA: Insufficient documentation

## 2014-08-19 DIAGNOSIS — E785 Hyperlipidemia, unspecified: Secondary | ICD-10-CM | POA: Diagnosis not present

## 2014-08-19 DIAGNOSIS — E119 Type 2 diabetes mellitus without complications: Secondary | ICD-10-CM | POA: Insufficient documentation

## 2014-08-19 DIAGNOSIS — J4489 Other specified chronic obstructive pulmonary disease: Secondary | ICD-10-CM | POA: Insufficient documentation

## 2014-08-19 DIAGNOSIS — Z860100 Personal history of colon polyps, unspecified: Secondary | ICD-10-CM | POA: Insufficient documentation

## 2014-08-19 DIAGNOSIS — Z8619 Personal history of other infectious and parasitic diseases: Secondary | ICD-10-CM | POA: Diagnosis not present

## 2014-08-19 DIAGNOSIS — Z872 Personal history of diseases of the skin and subcutaneous tissue: Secondary | ICD-10-CM | POA: Insufficient documentation

## 2014-08-19 DIAGNOSIS — K219 Gastro-esophageal reflux disease without esophagitis: Secondary | ICD-10-CM | POA: Diagnosis present

## 2014-08-19 DIAGNOSIS — Z87448 Personal history of other diseases of urinary system: Secondary | ICD-10-CM | POA: Diagnosis not present

## 2014-08-19 DIAGNOSIS — Z85841 Personal history of malignant neoplasm of brain: Secondary | ICD-10-CM | POA: Diagnosis not present

## 2014-08-19 DIAGNOSIS — Z8669 Personal history of other diseases of the nervous system and sense organs: Secondary | ICD-10-CM | POA: Insufficient documentation

## 2014-08-19 DIAGNOSIS — IMO0002 Reserved for concepts with insufficient information to code with codable children: Secondary | ICD-10-CM | POA: Insufficient documentation

## 2014-08-19 DIAGNOSIS — Z8739 Personal history of other diseases of the musculoskeletal system and connective tissue: Secondary | ICD-10-CM | POA: Diagnosis not present

## 2014-08-19 DIAGNOSIS — I10 Essential (primary) hypertension: Secondary | ICD-10-CM | POA: Insufficient documentation

## 2014-08-19 DIAGNOSIS — F17200 Nicotine dependence, unspecified, uncomplicated: Secondary | ICD-10-CM | POA: Insufficient documentation

## 2014-08-19 LAB — COMPREHENSIVE METABOLIC PANEL
ALT: 9 U/L (ref 0–53)
ANION GAP: 15 (ref 5–15)
AST: 13 U/L (ref 0–37)
Albumin: 3.2 g/dL — ABNORMAL LOW (ref 3.5–5.2)
Alkaline Phosphatase: 50 U/L (ref 39–117)
BILIRUBIN TOTAL: 0.4 mg/dL (ref 0.3–1.2)
BUN: 13 mg/dL (ref 6–23)
CHLORIDE: 98 meq/L (ref 96–112)
CO2: 24 mEq/L (ref 19–32)
Calcium: 8.7 mg/dL (ref 8.4–10.5)
Creatinine, Ser: 1.1 mg/dL (ref 0.50–1.35)
GFR calc Af Amer: 77 mL/min — ABNORMAL LOW (ref >90)
GFR calc non Af Amer: 66 mL/min — ABNORMAL LOW (ref >90)
Glucose, Bld: 126 mg/dL — ABNORMAL HIGH (ref 70–99)
Potassium: 3 mEq/L — ABNORMAL LOW (ref 3.7–5.3)
Sodium: 137 mEq/L (ref 137–147)
Total Protein: 6.2 g/dL (ref 6.0–8.3)

## 2014-08-19 LAB — CBC WITH DIFFERENTIAL/PLATELET
BASOS ABS: 0 10*3/uL (ref 0.0–0.1)
Basophils Relative: 0 % (ref 0–1)
EOS ABS: 0.4 10*3/uL (ref 0.0–0.7)
Eosinophils Relative: 4 % (ref 0–5)
HEMATOCRIT: 44.5 % (ref 39.0–52.0)
HEMOGLOBIN: 14.2 g/dL (ref 13.0–17.0)
Lymphocytes Relative: 14 % (ref 12–46)
Lymphs Abs: 1.3 10*3/uL (ref 0.7–4.0)
MCH: 27 pg (ref 26.0–34.0)
MCHC: 31.9 g/dL (ref 30.0–36.0)
MCV: 84.8 fL (ref 78.0–100.0)
MONO ABS: 0.7 10*3/uL (ref 0.1–1.0)
MONOS PCT: 8 % (ref 3–12)
NEUTROS ABS: 6.6 10*3/uL (ref 1.7–7.7)
Neutrophils Relative %: 74 % (ref 43–77)
Platelets: 401 10*3/uL — ABNORMAL HIGH (ref 150–400)
RBC: 5.25 MIL/uL (ref 4.22–5.81)
RDW: 14.4 % (ref 11.5–15.5)
WBC: 9 10*3/uL (ref 4.0–10.5)

## 2014-08-19 LAB — I-STAT TROPONIN, ED
TROPONIN I, POC: 0 ng/mL (ref 0.00–0.08)
Troponin i, poc: 0 ng/mL (ref 0.00–0.08)

## 2014-08-19 LAB — LIPASE, BLOOD: Lipase: 47 U/L (ref 11–59)

## 2014-08-19 MED ORDER — SUCRALFATE 1 G PO TABS: 1.0000 g | ORAL_TABLET | Freq: Four times a day (QID) | ORAL | Status: DC

## 2014-08-19 MED ORDER — GI COCKTAIL ~~LOC~~
30.0000 mL | Freq: Once | ORAL | Status: AC
  Administered 2014-08-19: 30 mL via ORAL
  Filled 2014-08-19: qty 30

## 2014-08-19 MED ORDER — SUCRALFATE 1 G PO TABS
1.0000 g | ORAL_TABLET | Freq: Once | ORAL | Status: AC
  Administered 2014-08-19: 1 g via ORAL
  Filled 2014-08-19: qty 1

## 2014-08-19 NOTE — ED Provider Notes (Signed)
CSN: [id number]     Arrival date & time 08/19/14  2037 History   First MD Initiated Contact with Patient 08/19/14 2120     Chief Complaint  Patient presents with  . Gastrophageal Reflux     (Consider location/radiation/quality/duration/timing/severity/associated sxs/prior Treatment) Patient is a 68 y.o. male presenting with GERD. The history is provided by the patient.  Gastrophageal Reflux This is a chronic problem. The current episode started more than 2 days ago. The problem occurs constantly. The problem has not changed since onset.Pertinent negatives include no chest pain, no abdominal pain, no headaches and no shortness of breath. Nothing aggravates the symptoms. Nothing relieves the symptoms.    Past Medical History  Diagnosis Date  . THRUSH   . ADENOMATOUS COLONIC POLYP   . DIABETES MELLITUS, TYPE II   . HYPERLIPIDEMIA   . SMOKER   . BLURRED VISION   . HYPERTENSION   . ALLERGIC RHINITIS   . CHRONIC OBSTRUCTIVE PULMONARY DISEASE, ACUTE EXACERBATION   . COPD   . PULMONARY FIBROSIS   . ESOPHAGEAL STRICTURE   . GERD   . DIVERTICULAR DISEASE   . BENIGN PROSTATIC HYPERTROPHY   . Acute prostatitis   . EPIDIDYMO-ORCHITIS   . SKIN LESION   . LUMBAR DISC DISORDER   . Pain in soft tissues of limb   . MUSCLE CRAMPS   . FATIGUE   . MASS, SUPERFICIAL   . DYSPNEA   . Wheezing   . DYSPNEA/SHORTNESS OF BREATH   . Nocturia   . PSA, INCREASED   . ABNORMAL ELECTROCARDIOGRAM   . ERECTILE DYSFUNCTION, ORGANIC, HX OF   . SYNCOPE   . Brain tumor   . COPD (chronic obstructive pulmonary disease)   . Chronic steroid use    Past Surgical History  Procedure Laterality Date  . Back surgery      x 4 disabled after 2003  . Elbow arthroscopy     Family History  Problem Relation Age of Onset  . Diabetes Mother   . Colon cancer Neg Hx   . Asthma Mother   . Emphysema Mother     smoker   History  Substance Use Topics  . Smoking status: Current Every Day Smoker -- 1.00 packs/day  for 50 years    Types: Cigarettes  . Smokeless tobacco: Never Used  . Alcohol Use: No    Review of Systems  Constitutional: Negative for fever.  Respiratory: Negative for shortness of breath.   Cardiovascular: Negative for chest pain.  Gastrointestinal: Negative for abdominal pain.  Neurological: Negative for headaches.  All other systems reviewed and are negative.     Allergies  Doxycycline; Ace inhibitors; Cardura; Cozaar; Dexilant; Hydrocodone; Lisinopril; and Alfuzosin  Home Medications   Prior to Admission medications   Medication Sig Start Date End Date Taking? Authorizing Provider  amLODipine (NORVASC) 5 MG tablet Take 1 tablet (5 mg total) by mouth daily. 08/08/14 08/08/15 Yes Biagio Borg, MD  budesonide-formoterol [facility]) 160-4.5 MCG/ACT inhaler Inhale 2 puffs into the lungs 2 (two) times daily. 12/29/12  Yes Tanda Rockers, MD  cloNIDine (CATAPRES) 0.1 MG tablet Take 1 tablet (0.1 mg total) by mouth 3 (three) times daily. 08/08/14  Yes Biagio Borg, MD  diphenhydramine-acetaminophen (TYLENOL PM) 25-500 MG TABS Take 2 tablets by mouth at bedtime as needed (sleep and pain).   Yes Historical Provider, MD  ipratropium-albuterol (DUONEB) 0.5-2.5 (3) MG/3ML SOLN Take 3 mLs by nebulization every 6 (six) hours as needed (wheezing/shortness of breath).  08/08/14  Yes Biagio Borg, MD  lovastatin (MEVACOR) 20 MG tablet Take 40 mg by mouth daily at 6 PM.   Yes Historical Provider, MD  metFORMIN (GLUCOPHAGE) 500 MG tablet Take 1,000 mg by mouth 2 (two) times daily with a meal.   Yes Historical Provider, MD  omeprazole (PRILOSEC) 20 MG capsule Take 20 mg by mouth daily.   Yes Historical Provider, MD  sucralfate (CARAFATE) 1 G tablet Take 1 tablet (1 g total) by mouth 4 (four) times daily. 08/19/14   Evelina Bucy, MD   BP 133/108  Pulse 88  Temp(Src) 98.2 F (36.8 C) (Oral)  Resp 17  Ht 6\' 3"  (1.905 m)  Wt 214 lb (97.07 kg)  BMI 26.75 kg/m2  SpO2 98% Physical Exam  Nursing note  and vitals reviewed. Constitutional: He is oriented to person, place, and time. He appears well-developed and well-nourished. No distress.  HENT:  Head: Normocephalic and atraumatic.  Mouth/Throat: No oropharyngeal exudate.  Eyes: EOM are normal. Pupils are equal, round, and reactive to light.  Neck: Normal range of motion. Neck supple.  Cardiovascular: Normal rate and regular rhythm.  Exam reveals no friction rub.   No murmur heard. Pulmonary/Chest: Effort normal and breath sounds normal. No respiratory distress. He has no wheezes. He has no rales.  Abdominal: He exhibits no distension. There is no tenderness. There is no rebound.  Musculoskeletal: Normal range of motion. He exhibits no edema.  Neurological: He is alert and oriented to person, place, and time.  Skin: He is not diaphoretic.    ED Course  Procedures (including critical care time) Labs Review Labs Reviewed  CBC WITH DIFFERENTIAL - Abnormal; Notable for the following:    Platelets 401 (*)    All other components within normal limits  COMPREHENSIVE METABOLIC PANEL - Abnormal; Notable for the following:    Potassium 3.0 (*)    Glucose, Bld 126 (*)    Albumin 3.2 (*)    GFR calc non Af Amer 66 (*)    GFR calc Af Amer 77 (*)    All other components within normal limits  LIPASE, BLOOD  I-STAT TROPOININ, ED  I-STAT TROPOININ, ED    Imaging Review No results found.   EKG Interpretation   Date/Time:  Saturday August 19 2014 21:19:37 EDT Ventricular Rate:  97 PR Interval:  202 QRS Duration: 92 QT Interval:  355 QTC Calculation: 451 R Axis:   32 Text Interpretation:  Sinus rhythm Left atrial enlargement Abnormal R-wave  progression, early transition LVH with secondary repolarization  abnormality Similar to prior Confirmed by Mingo Amber  MD, [place] ([id number]) on  08/19/2014 9:53:01 PM      MDM   Final diagnoses:  Gastroesophageal reflux disease without esophagitis    19M with hx of GERD presents with complaints  of GERD. Ate some fried foods about 6 days ago, presents with intermittent difficulty with keeping foods down. No nausea, SOB, CP, but having times when he eats and feels like food is coming back up. Hx of episodes like this. On PPI at this time. Labs ok. EKG ok. Clinical picture not c/w ACS. Feeling better after GI cocktail, carafate, given carafate to go home, instructed to f/u with PCP.    Evelina Bucy, MD 08/19/14 [phone]

## 2014-08-19 NOTE — ED Notes (Signed)
Pt reports problems with his acid reflux. sts he is unable to eat or drink d/t it coming right back up. Pt taking medications for same but they are not helping. Pt denies pain. But reports nausea.

## 2014-08-19 NOTE — Discharge Instructions (Signed)
Gastroesophageal Reflux Disease, Adult Gastroesophageal reflux disease (GERD) happens when acid from your stomach flows up into the esophagus. When acid comes in contact with the esophagus, the acid causes soreness (inflammation) in the esophagus. Over time, GERD may create small holes (ulcers) in the lining of the esophagus. CAUSES   Increased body weight. This puts pressure on the stomach, making acid rise from the stomach into the esophagus.  Smoking. This increases acid production in the stomach.  Drinking alcohol. This causes decreased pressure in the lower esophageal sphincter (valve or ring of muscle between the esophagus and stomach), allowing acid from the stomach into the esophagus.  Late evening meals and a full stomach. This increases pressure and acid production in the stomach.  A malformed lower esophageal sphincter. Sometimes, no cause is found. SYMPTOMS   Burning pain in the lower part of the mid-chest behind the breastbone and in the mid-stomach area. This may occur twice a week or more often.  Trouble swallowing.  Sore throat.  Dry cough.  Asthma-like symptoms including chest tightness, shortness of breath, or wheezing. DIAGNOSIS  Your caregiver may be able to diagnose GERD based on your symptoms. In some cases, X-rays and other tests may be done to check for complications or to check the condition of your stomach and esophagus. TREATMENT  Your caregiver may recommend over-the-counter or prescription medicines to help decrease acid production. Ask your caregiver before starting or adding any new medicines.  HOME CARE INSTRUCTIONS   Change the factors that you can control. Ask your caregiver for guidance concerning weight loss, quitting smoking, and alcohol consumption.  Avoid foods and drinks that make your symptoms worse, such as:  Caffeine or alcoholic drinks.  Chocolate.  Peppermint or mint flavorings.  Garlic and onions.  Spicy foods.  Citrus fruits,  such as oranges, lemons, or limes.  Tomato-based foods such as sauce, chili, salsa, and pizza.  Fried and fatty foods.  Avoid lying down for the 3 hours prior to your bedtime or prior to taking a nap.  Eat small, frequent meals instead of large meals.  Wear loose-fitting clothing. Do not wear anything tight around your waist that causes pressure on your stomach.  Raise the head of your bed 6 to 8 inches with wood blocks to help you sleep. Extra pillows will not help.  Only take over-the-counter or prescription medicines for pain, discomfort, or fever as directed by your caregiver.  Do not take aspirin, ibuprofen, or other nonsteroidal anti-inflammatory drugs (NSAIDs). SEEK IMMEDIATE MEDICAL CARE IF:   You have pain in your arms, neck, jaw, teeth, or back.  Your pain increases or changes in intensity or duration.  You develop nausea, vomiting, or sweating (diaphoresis).  You develop shortness of breath, or you faint.  Your vomit is green, yellow, black, or looks like coffee grounds or blood.  Your stool is red, bloody, or black. These symptoms could be signs of other problems, such as heart disease, gastric bleeding, or esophageal bleeding. MAKE SURE YOU:   Understand these instructions.  Will watch your condition.  Will get help right away if you are not doing well or get worse. Document Released: 09/17/2005 Document Revised: 03/01/2012 Document Reviewed: 06/27/2011 Exit[employer] Patient Information 2015 [employer]. This information is not intended to replace advice given to you by your health care provider. Make sure you discuss any questions you have with your health care provider.  

## 2014-08-21 ENCOUNTER — Telehealth: Payer: Self-pay | Admitting: Internal Medicine

## 2014-08-21 NOTE — Telephone Encounter (Signed)
Pt called stating he has an appointment with Dr. Olevia Perches on 09/19/14, he would like to know if Dr. Jenny Reichmann can do anything to get him in sooner??  He went to the ED on 08/18/14 due to the GI pain and isn't feeling any better.

## 2014-08-21 NOTE — Telephone Encounter (Signed)
Spoke with patient and he states he went to ED for reflux. States he continues to have problems with reflux. When questioned about medications, he states he never got the Carafate rx filled after going to ED. Patient will get this filled and start it. He is taking his Prilosec. Moved OV to 09/05/14 at 2:30 PM with Dr. Olevia Perches. He understands to call back if Carafate does not help.

## 2014-08-22 NOTE — Telephone Encounter (Signed)
Very sorry, I dont have influence on the scheduling; I can refer to other GI outside [place] if needed

## 2014-08-23 NOTE — Telephone Encounter (Signed)
Called the patient left a detailed message of MD's response to request. 

## 2014-09-01 ENCOUNTER — Encounter: Payer: Self-pay | Admitting: Internal Medicine

## 2014-09-05 ENCOUNTER — Ambulatory Visit (HOSPITAL_BASED_OUTPATIENT_CLINIC_OR_DEPARTMENT_OTHER): Payer: Medicare Other | Admitting: Oncology

## 2014-09-05 ENCOUNTER — Other Ambulatory Visit (HOSPITAL_BASED_OUTPATIENT_CLINIC_OR_DEPARTMENT_OTHER): Payer: Medicare Other

## 2014-09-05 ENCOUNTER — Encounter: Payer: Self-pay | Admitting: Internal Medicine

## 2014-09-05 ENCOUNTER — Ambulatory Visit (INDEPENDENT_AMBULATORY_CARE_PROVIDER_SITE_OTHER): Payer: Medicare Other | Admitting: Internal Medicine

## 2014-09-05 VITALS — BP 140/74 | HR 78 | Temp 98.4°F | Resp 18 | Ht 75.0 in | Wt 200.7 lb

## 2014-09-05 VITALS — BP 120/80 | HR 88 | Ht 74.25 in | Wt 200.5 lb

## 2014-09-05 DIAGNOSIS — E119 Type 2 diabetes mellitus without complications: Secondary | ICD-10-CM

## 2014-09-05 DIAGNOSIS — J449 Chronic obstructive pulmonary disease, unspecified: Secondary | ICD-10-CM

## 2014-09-05 DIAGNOSIS — K219 Gastro-esophageal reflux disease without esophagitis: Secondary | ICD-10-CM

## 2014-09-05 DIAGNOSIS — Z862 Personal history of diseases of the blood and blood-forming organs and certain disorders involving the immune mechanism: Secondary | ICD-10-CM

## 2014-09-05 DIAGNOSIS — D66 Hereditary factor VIII deficiency: Secondary | ICD-10-CM

## 2014-09-05 DIAGNOSIS — I10 Essential (primary) hypertension: Secondary | ICD-10-CM

## 2014-09-05 DIAGNOSIS — K2100 Gastro-esophageal reflux disease with esophagitis, without bleeding: Secondary | ICD-10-CM

## 2014-09-05 DIAGNOSIS — K2270 Barrett's esophagus without dysplasia: Secondary | ICD-10-CM

## 2014-09-05 DIAGNOSIS — D68318 Other hemorrhagic disorder due to intrinsic circulating anticoagulants, antibodies, or inhibitors: Secondary | ICD-10-CM

## 2014-09-05 DIAGNOSIS — R5383 Other fatigue: Secondary | ICD-10-CM

## 2014-09-05 DIAGNOSIS — R5381 Other malaise: Secondary | ICD-10-CM

## 2014-09-05 MED ORDER — OMEPRAZOLE 40 MG PO CPDR: DELAYED_RELEASE_CAPSULE | ORAL | Status: DC

## 2014-09-05 MED ORDER — SUCRALFATE 1 G PO TABS: 1.0000 g | ORAL_TABLET | Freq: Two times a day (BID) | ORAL | Status: DC

## 2014-09-05 NOTE — Patient Instructions (Addendum)
You have been given a separate informational sheet regarding your tobacco use, the importance of quitting and local resources to help you quit.  We have sent the following medications to your pharmacy for you to pick up at your convenience: Omeprazole 40 mg every afternoon (in place of omeprazole 20 mg) Carafate 1 tablet twice daily  CC:Dr Marshall Cork

## 2014-09-05 NOTE — Progress Notes (Signed)
Richard Hopkins 06/25/1946 [id number]  Note: This dictation was prepared with Dragon digital system. Any transcriptional errors that result from this procedure are unintentional.   History of Present Illness:  This is a 68 year old African American Hopkins ,smoker, with severe gastroesophageal reflux. He has experienced  recent severe exacerbation necessitating an emergency room visit. He saw Randel Books in our office in May 2015 and started on Dexilant and Carafate but he did not take any of it. Dexilant caused a rash. He is back on Prilosec 20 mg every morning. He has started taking Carafate given in ED with good results. His condition has improved about 75%. He no longer has dysphagia, decreased appetite or nausea. His last upper endoscopy in March 2014 showed no evidence of stricture. Patient had gastritis with focal metaplasia. A prior endoscopy in 2010 showed Barrett's esophagus and stricture. A CT scan of the chest in March 2014 showed a thickened distal esophagus. He had a colonoscopy in January 2014 which showed 5 polyps which were tubular adenomas. His recall colonoscopy will be due in January 2017. A barium esophagram in May 2015 showed esophagitis with spasm. 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     Past Medical History  Diagnosis Date  . THRUSH   . ADENOMATOUS COLONIC POLYP   . DIABETES MELLITUS, TYPE II   . HYPERLIPIDEMIA   . SMOKER   . BLURRED VISION   . HYPERTENSION   . ALLERGIC RHINITIS   . CHRONIC OBSTRUCTIVE PULMONARY DISEASE, ACUTE EXACERBATION   . COPD   . PULMONARY FIBROSIS   . ESOPHAGEAL STRICTURE   . GERD   . DIVERTICULAR DISEASE   . BENIGN PROSTATIC HYPERTROPHY   . Acute prostatitis   . EPIDIDYMO-ORCHITIS   . SKIN LESION   . LUMBAR DISC DISORDER   . Pain in soft tissues of limb   . MUSCLE CRAMPS   . FATIGUE   . MASS, SUPERFICIAL   . DYSPNEA   . Wheezing   . DYSPNEA/SHORTNESS OF BREATH   . Nocturia   . PSA, INCREASED   .  ABNORMAL ELECTROCARDIOGRAM   . ERECTILE DYSFUNCTION, ORGANIC, HX OF   . SYNCOPE   . Brain tumor   . COPD (chronic obstructive pulmonary disease)   . Chronic steroid use     Past Surgical History  Procedure Laterality Date  . Back surgery      x 4 disabled after 2003  . Elbow arthroscopy      Allergies  Allergen Reactions  . Doxycycline Nausea Only and Other (See Comments)    Loss of memory; "bad feeling," dizziness  . Ace Inhibitors Cough  . Cardura [Doxazosin Mesylate] Other (See Comments)    dizziness  . Cozaar [Losartan Potassium] Other (See Comments)    dizziness  . Dexilant [Dexlansoprazole] Itching and Rash  . Hydrocodone Nausea Only and Other (See Comments)    dizziness  . Lisinopril Cough  . Alfuzosin Rash    Family history and social history have been reviewed.  Review of Systems: Negative for dysphagia  The remainder of the 10 point ROS is negative except as outlined in the H&P  Physical Exam: General Appearance Well developed, in no distress Eyes  Non icteric  HEENT  Non traumatic, normocephalic  Mouth No lesion, tongue papillated, no cheilosis Neck Supple without adenopathy, thyroid not enlarged, no carotid bruits, no JVD Lungs Clear to auscultation bilaterally COR Normal S1, normal S2, regular rhythm, no murmur, quiet precordium Abdomen Soft minimally  tender in subxiphoid area. Normal active bowel sounds. Lower abdomen unremarkable  Rectal Not done  Extremities  No pedal edema Skin No lesions Neurological Alert and oriented x 3 Psychological Normal mood and affect  Assessment and Plan:   Richard Hopkins, smoker, with severe gastroesophageal reflux. I have discussed smoking cessation with him. He did not take medications  Recommended by APP. He will follow antireflux measures. We will refill his Carafate 1 g by mouth twice a day. He is to continue Prilosec 40 mg daily which is a higher dose.. A prescription was given. I  will see him on an as necessary basis.  Richard #2 Adenomatous colon polyps on the last colonoscopy in January 2014. A recall exam will be due in January 2017.    Delfin Edis 09/05/2014

## 2014-09-05 NOTE — Progress Notes (Signed)
  [place] OFFICE PROGRESS NOTE   Diagnosis: Factor VIII inhibitor  INTERVAL HISTORY:   He returns as scheduled. No bleeding. Swelling at the low legs and ankles at the end of the day. He complains of malaise and exertional dyspnea. He is being evaluated by Dr. Olevia Perches for "reflux "symptoms. He relates recent weight loss to reflux.  Objective:  Vital signs in last 24 hours:  Blood pressure 140/74, pulse 78, temperature 98.4 F (36.9 C), temperature source Oral, resp. rate 18, height 6\' 3"  (1.905 m), weight 200 lb 11.2 oz (91.037 kg).    HEENT: Neck without mass Lymphatics: No cervical, supraclavicular, axillary, or inguinal nodes Resp: Lungs with distant breath sounds bilaterally, no respiratory distress Cardio: Regular rate and rhythm GI: No hepatosplenomegaly Vascular: No leg edema  Skin: No ecchymoses    Lab Results:  Lab Results  Component Value Date   WBC 9.0 08/19/2014   HGB 14.2 08/19/2014   HCT 44.5 08/19/2014   MCV 84.8 08/19/2014   PLT 401* 08/19/2014   NEUTROABS 6.6 08/19/2014   Factor VIII and a PTT pending Medications: I have reviewed the patient's current medications.  Assessment/Plan: 1. 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 Bleeding secondary to #1 with extensive ecchymoses. Ecchymoses have resolved. 3. Anemia secondary to bleeding, polyphlebotomy. Resolved 4. Diabetes.  5. Hypertension.  6. COPD.  7. BPH.  8. Bilateral lower leg swelling-most likely related to prednisone. Improved with Lasix. Resolved. 9. GERD. 55. Malaise-most likely related to advanced COPD  Disposition:  There is no evidence for recurrence of the factor VIII inhibitor. He remains off of prednisone. We will followup on the factor VIII level and a PTT from today. He will return for office and lab visit in 4 months. He plans to see Dr. Olevia Perches later today for  evaluation of the reflux symptoms. He will see Dr. Melvyn Novas next week.  Betsy Coder, MD  09/05/2014  11:38 AM

## 2014-09-06 ENCOUNTER — Telehealth: Payer: Self-pay | Admitting: Oncology

## 2014-09-06 LAB — APTT: aPTT: 35 seconds (ref 24–37)

## 2014-09-06 LAB — FACTOR 8 ASSAY: Coagulation Factor VIII: 138 % (ref 73–140)

## 2014-09-06 NOTE — Telephone Encounter (Signed)
sch pt appt-will mail pt copy of appt

## 2014-09-13 ENCOUNTER — Ambulatory Visit (INDEPENDENT_AMBULATORY_CARE_PROVIDER_SITE_OTHER): Payer: Medicare Other | Admitting: Internal Medicine

## 2014-09-13 ENCOUNTER — Encounter: Payer: Self-pay | Admitting: Internal Medicine

## 2014-09-13 VITALS — BP 130/84 | HR 85 | Temp 98.2°F | Ht 75.0 in | Wt 198.2 lb

## 2014-09-13 DIAGNOSIS — F17200 Nicotine dependence, unspecified, uncomplicated: Secondary | ICD-10-CM

## 2014-09-13 DIAGNOSIS — J8410 Pulmonary fibrosis, unspecified: Secondary | ICD-10-CM

## 2014-09-13 DIAGNOSIS — J449 Chronic obstructive pulmonary disease, unspecified: Secondary | ICD-10-CM

## 2014-09-13 MED ORDER — VARENICLINE TARTRATE 0.5 MG X 11 & 1 MG X 42 PO MISC: ORAL | Status: DC

## 2014-09-13 NOTE — Assessment & Plan Note (Signed)
-   03/07/2013  Walked RA  3 laps @ 185 ft each stopped due to  End of study, sats 94%  Still smoking and has not returned for pfts as rec > see smoking  A/p

## 2014-09-13 NOTE — Progress Notes (Signed)
Subjective:    Patient ID: Richard Hopkins, male    DOB: July 07, 1946  MRN: [id number]    Brief patient profile:  48 wobm still smoking with gradually worse doe x since early 2000's   03/15/2008 Pulmonary eval, rec stop smoking, return if more sob or cough   August 30, 2010 Pulmonary consult for COPD...pt c/o fatigue for over 6 months...imp cxr from 08/20/10 reviewed c/w  mild PF but the number one concern in smokers is RBILD vs DIP, both directly linked to smoking and no w/u needs to be considered at this point. DLCO is normal. If there is convincing progression of dyspnea or desaturation with exercise first step is stop smoking then consider HRCT if not improving and pulmonary f/u prn  03/07/13 pulmonary consultation /Delorus Langwell requested by Dr Marshall Cork re abd CT with MPN cc minimal increase doe x 2 years, really not limiting activity assoc with dry cough.  rec Work on inhaler technique Return  for pfts> did not return    09/13/2014 f/u ov/Sharika Mosquera re: ? PF / ? Copd  Chief Complaint  Patient presents with  . Follow-up    Pt states having increased SOB and congestion x 1 wk- relates to weather change. He c/o no energy and fatigue for "quite a while" and seems to be getting worse.   Sedentary/ Not limited by breathing from desired activities    Using symbicort about every other day with poor mdi  No obvious day to day or daytime variabilty or assoc cp or chest tightness, subjective wheeze overt sinus or hb symptoms. No unusual exp hx or h/o childhood pna/ asthma or knowledge of premature birth.  Sleeping ok without nocturnal  or early am exacerbation  of respiratory  c/o's or need for noct saba. Also denies any obvious fluctuation of symptoms with weather or environmental changes or other aggravating or alleviating factors except as outlined above   Current Medications, Allergies, Complete Past Medical History, Past Surgical History, Family History, and Social History were reviewed in [employer] record.  ROS  The following are not active complaints unless bolded sore throat, dysphagia, dental problems, itching, sneezing,  nasal congestion or excess/ purulent secretions, ear ache,   fever, chills, sweats, unintended wt loss, pleuritic or exertional cp, hemoptysis,  orthopnea pnd or leg swelling, presyncope, palpitations, heartburn, abdominal pain, anorexia, nausea, vomiting, diarrhea  or change in bowel or urinary habits, change in stools or urine, dysuria,hematuria,  rash, arthralgias, visual complaints, headache, numbness weakness or ataxia or problems with walking or coordination,  change in mood/affect or memory.        Allergies (verified):  1) ! Lisinopril (Lisinopril)  2) ! Ace Inhibitors     Past Medical History:  COPD............................................Marland KitchenWert  - 2009 FEV1 baseline of 1.8 > course of prednisone with an FEV1 up to 2.45 with a ratio of 57%  - ? 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  Allergic rhinitis  Benign prostatic hypertrophy              Objective:   Physical Exam   wt 223 August 30, 2010 > 224 October 12, 2010 > 216 03/07/2013  > 09/13/2014  198    HEENT mild turbinate edema. Dentures full Oropharynx no thrush or  excess pnd or cobblestoning. No JVD or cervical adenopathy. Mild accessory muscle hypertrophy. Trachea midline, nl thryroid. Chest was hyperinflated by percussion with diminished breath sounds and moderate increased exp time with MID expiratory bilateral wheeze. Hoover sign positive at mid inspiration. Regular rate and rhythm without murmur gallop or rub or increase P2. Abd: no hsm, nl excursion. Ext warm without cyanosis clubbing or edema.   cxr 03/01/13  No evidence of pulmonary mass.  2. However, there are  multiple nonspecific pulmonary nodules in  the lungs bilaterally, as above. The largest of these measures 1  cm in the right lower lobe (image 38 of series 3). Many of these  nodules are slightly cavitary with thick walls. The overall  appearance is suggestive of potential smoking related disease such  as pulmonary Langerhans cell histiocytosis. Clinical correlation  is recommended with a strong consideration for smoking cessation,  as this can be rapidly progressive and destructive lung disease if  smoking cessation is not implemented. Additionally, the  possibility of malignancy is not excluded, and follow up evaluation  with repeat chest CT in 3-6 months is recommended to evaluate for  the stability of these findings.   3. 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 Attention on follow-up studies is recommended to assess for  temporal progression of disease.  4. Atherosclerosis, including left main and left circumflex  coronary artery disease. Assessment for potential risk factor  modification, dietary therapy or pharmacologic therapy may be  warranted, if clinically indicated.  5. Severe circumferential thickening of the distal esophagus.  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 No  definite acute overlying pulmonary process     Assessment & Plan:

## 2014-09-13 NOTE — Assessment & Plan Note (Signed)
>   3 m discussion  Discussed in detail all the  indications, usual  risks and alternatives  relative to the benefits with patient who agrees to proceed with chantix starter pack

## 2014-09-13 NOTE — Assessment & Plan Note (Signed)
The proper method of use, as well as anticipated side effects, of a metered-dose inhaler are discussed and demonstrated to the patient. Improved effectiveness after extensive coaching during this visit to a level of approximately  75% as having problems with trigger at onset of insp    Each maintenance medication was reviewed in detail including most importantly the difference between maintenance and as needed and under what circumstances the prns are to be used.  Please see instructions for details which were reviewed in writing and the patient given a copy.    Needs to return for pfts and work on stopping smoking

## 2014-09-13 NOTE — Patient Instructions (Signed)
chantix starter pack is the best option to reduce or eliminate smoking  Please schedule a follow up office visit in 6 weeks, call sooner if needed

## 2014-09-19 ENCOUNTER — Ambulatory Visit: Payer: Medicare Other | Admitting: Internal Medicine

## 2014-09-28 ENCOUNTER — Encounter: Payer: Self-pay | Admitting: Internal Medicine

## 2014-09-28 ENCOUNTER — Ambulatory Visit (INDEPENDENT_AMBULATORY_CARE_PROVIDER_SITE_OTHER): Payer: Medicare Other | Admitting: Internal Medicine

## 2014-09-28 VITALS — BP 132/82 | HR 89 | Temp 98.6°F | Wt 200.0 lb

## 2014-09-28 DIAGNOSIS — R351 Nocturia: Secondary | ICD-10-CM

## 2014-09-28 DIAGNOSIS — Z0000 Encounter for general adult medical examination without abnormal findings: Secondary | ICD-10-CM

## 2014-09-28 DIAGNOSIS — E119 Type 2 diabetes mellitus without complications: Secondary | ICD-10-CM

## 2014-09-28 DIAGNOSIS — Z23 Encounter for immunization: Secondary | ICD-10-CM

## 2014-09-28 DIAGNOSIS — J438 Other emphysema: Secondary | ICD-10-CM

## 2014-09-28 DIAGNOSIS — Z0189 Encounter for other specified special examinations: Secondary | ICD-10-CM

## 2014-09-28 LAB — GLUCOSE, POCT (MANUAL RESULT ENTRY): POC GLUCOSE: 101 mg/dL — AB (ref 70–99)

## 2014-09-28 MED ORDER — TAMSULOSIN HCL 0.4 MG PO CAPS: 0.4000 mg | ORAL_CAPSULE | Freq: Every day | ORAL | Status: DC

## 2014-09-28 NOTE — Assessment & Plan Note (Signed)
stable overall by history and exam, recent data reviewed with pt, and pt to continue medical treatment as before,  to f/u any worsening symptoms or concerns SpO2 Readings from Last 3 Encounters:  09/28/14 95%  09/13/14 97%  08/19/14 97%

## 2014-09-28 NOTE — Progress Notes (Signed)
Subjective:    Patient ID: Richard Hopkins, male    DOB: 1946/04/28, 68 y.o.   MRN: [id number]  HPI Here to f/u, saw optometry recently with some blurry vision that seemed to change? Such that he had to re-present for a followup exam and change of glasses prescription, is considering seeing opthamology, would like blood sugar check.  CBG in office today 101. Has remote hx of slight elev a1c, stable in normal range in last 2 yrs.  Pt denies chest pain, increased sob or doe, wheezing, orthopnea, PND, increased LE swelling, palpitations, dizziness or syncope.  Also with worsening urinary nocturia gradually now up to 3 times per night, with recent difficulty getting started and slower stream.   Pt denies fever, wt loss, night sweats, loss of appetite, or other constitutional symptoms. Denies urinary symptoms such as dysuria, frequency, urgency, flank pain, hematuria or n/v, fever, chills. Past Medical History  Diagnosis Date  . THRUSH   . ADENOMATOUS COLONIC POLYP   . DIABETES MELLITUS, TYPE II   . HYPERLIPIDEMIA   . SMOKER   . BLURRED VISION   . HYPERTENSION   . ALLERGIC RHINITIS   . CHRONIC OBSTRUCTIVE PULMONARY DISEASE, ACUTE EXACERBATION   . COPD   . PULMONARY FIBROSIS   . ESOPHAGEAL STRICTURE   . GERD   . DIVERTICULAR DISEASE   . BENIGN PROSTATIC HYPERTROPHY   . Acute prostatitis   . EPIDIDYMO-ORCHITIS   . SKIN LESION   . LUMBAR DISC DISORDER   . Pain in soft tissues of limb   . MUSCLE CRAMPS   . FATIGUE   . MASS, SUPERFICIAL   . DYSPNEA   . Wheezing   . DYSPNEA/SHORTNESS OF BREATH   . Nocturia   . PSA, INCREASED   . ABNORMAL ELECTROCARDIOGRAM   . ERECTILE DYSFUNCTION, ORGANIC, HX OF   . SYNCOPE   . Brain tumor   . COPD (chronic obstructive pulmonary disease)   . Chronic steroid use    Past Surgical History  Procedure Laterality Date  . Back surgery      x 4 disabled after 2003  . Elbow arthroscopy      reports that he has been smoking Cigarettes.  He has a 50  pack-year smoking history. He has never used smokeless tobacco. He reports that he does not drink alcohol or use illicit drugs. family history includes Asthma in his mother; Diabetes in his mother; Emphysema in his mother. There is no history of Colon cancer. Allergies  Allergen Reactions  . Doxycycline Nausea Only and Other (See Comments)    Loss of memory; "bad feeling," dizziness  . Ace Inhibitors Cough  . Cardura [Doxazosin Mesylate] Other (See Comments)    dizziness  . Cozaar [Losartan Potassium] Other (See Comments)    dizziness  . Dexilant [Dexlansoprazole] Itching and Rash  . Hydrocodone Nausea Only and Other (See Comments)    dizziness  . Lisinopril Cough  . Alfuzosin Rash   Current Outpatient Prescriptions on File Prior to Visit  Medication Sig Dispense Refill  . amLODipine (NORVASC) 5 MG tablet Take 1 tablet (5 mg total) by mouth daily.  90 tablet  3  . budesonide-formoterol (SYMBICORT) 160-4.5 MCG/ACT inhaler Inhale 2 puffs into the lungs 2 (two) times daily.  1 Inhaler  0  . cloNIDine (CATAPRES) 0.1 MG tablet Take 1 tablet (0.1 mg total) by mouth 3 (three) times daily.  270 tablet  3  . diphenhydramine-acetaminophen (TYLENOL PM) 25-500 MG TABS Take 2 tablets  by mouth at bedtime as needed (sleep and pain).      Marland Kitchen ipratropium-albuterol (DUONEB) 0.5-2.5 (3) MG/3ML SOLN Take 3 mLs by nebulization every 6 (six) hours as needed (wheezing/shortness of breath).  90 mL  0  . lovastatin (MEVACOR) 20 MG tablet Take 40 mg by mouth daily at 6 PM.      . metFORMIN (GLUCOPHAGE) 500 MG tablet Take 1,000 mg by mouth 2 (two) times daily with a meal.      . omeprazole (PRILOSEC) 40 MG capsule Take 1 capsule by mouth every afternoon.  30 capsule  6  . sucralfate (CARAFATE) 1 G tablet Take 1 tablet (1 g total) by mouth 2 (two) times daily.  60 tablet  3  . varenicline (CHANTIX PAK) 0.5 MG X 11 & 1 MG X 42 tablet Take one 0.5 mg tablet by mouth once daily for 3 days, then increase to one 0.5 mg  tablet twice daily for 4 days, then increase to one 1 mg tablet twice daily.  53 tablet  0   No current facility-administered medications on file prior to visit.   Review of Systems  Constitutional: Negative for unusual diaphoresis or other sweats  HENT: Negative for ringing in ear Eyes: Negative for double vision or worsening visual disturbance.  Respiratory: Negative for choking and stridor.   Gastrointestinal: Negative for vomiting or other signifcant bowel change Genitourinary: Negative for hematuria or decreased urine volume.  Musculoskeletal: Negative for other MSK pain or swelling Skin: Negative for color change and worsening wound.  Neurological: Negative for tremors and numbness other than noted  Psychiatric/Behavioral: Negative for decreased concentration or agitation other than above       Objective:   Physical Exam BP 132/82  Pulse 89  Temp(Src) 98.6 F (37 C) (Oral)  Wt 200 lb (90.719 kg)  SpO2 95% VS noted,  Constitutional: Pt appears well-developed, well-nourished.  HENT: Head: NCAT.  Right Ear: External ear normal.  Left Ear: External ear normal.  Eyes: . Pupils are equal, round, and reactive to light. Conjunctivae and EOM are normal Neck: Normal range of motion. Neck supple.  Cardiovascular: Normal rate and regular rhythm.   Pulmonary/Chest: Effort normal and breath sounds decreased, no rales or wheezing.  Abd:  Soft, NT, ND, + BS Neurological: Pt is alert. Not confused , motor grossly intact Skin: Skin is warm. No rash Psychiatric: Pt behavior is normal. No agitation.     Assessment & Plan:

## 2014-09-28 NOTE — Progress Notes (Signed)
Pre visit review using our clinic review tool, if applicable. No additional management support is needed unless otherwise documented below in the visit note. 

## 2014-09-28 NOTE — Patient Instructions (Addendum)
You had the flu shot today  Please take all new medication as prescribed - the flomax - 1 per day  Please call if this does not help enough, for referral to urology  Please continue all other medications as before  Please have the pharmacy call with any other refills you may need.  Please keep your appointments with your specialists as you may have planned  Please go to the LAB in the Basement (turn left off the elevator) for the tests to be done at your convenience  You will be contacted by phone if any changes need to be made immediately.  Otherwise, you will receive a letter about your results with an explanation, but please check with MyChart first.  Please return in 6 months, or sooner if needed, with Lab testing done 3-5 days before (the office will call with an appointment)

## 2014-09-28 NOTE — Assessment & Plan Note (Signed)
stable overall by history and exam, recent data reviewed with pt, and pt to continue medical treatment as before,  to f/u any worsening symptoms or concerns Lab Results  Component Value Date   HGBA1C 6.4 04/20/2014   For f/u a1c, cbg today 101

## 2014-09-28 NOTE — Assessment & Plan Note (Signed)
For flomax asd,  to f/u any worsening symptoms or concerns, consider urology referral

## 2014-09-29 ENCOUNTER — Telehealth: Payer: Self-pay

## 2014-09-29 NOTE — Telephone Encounter (Signed)
Pt stated that he had an eye exam earlier this year.  He could not remember the name of the doctor or the place.  Stated that the facility was near [place] off of [address].

## 2014-10-25 ENCOUNTER — Ambulatory Visit (INDEPENDENT_AMBULATORY_CARE_PROVIDER_SITE_OTHER): Payer: Medicare Other | Admitting: Internal Medicine

## 2014-10-25 ENCOUNTER — Encounter: Payer: Self-pay | Admitting: Internal Medicine

## 2014-10-25 VITALS — BP 140/84 | HR 90 | Ht 75.0 in | Wt 202.0 lb

## 2014-10-25 DIAGNOSIS — F17210 Nicotine dependence, cigarettes, uncomplicated: Secondary | ICD-10-CM

## 2014-10-25 DIAGNOSIS — F17200 Nicotine dependence, unspecified, uncomplicated: Secondary | ICD-10-CM

## 2014-10-25 DIAGNOSIS — Z720 Tobacco use: Secondary | ICD-10-CM

## 2014-10-25 DIAGNOSIS — J8410 Pulmonary fibrosis, unspecified: Secondary | ICD-10-CM

## 2014-10-25 DIAGNOSIS — J449 Chronic obstructive pulmonary disease, unspecified: Secondary | ICD-10-CM

## 2014-10-25 LAB — PULMONARY FUNCTION TEST
DL/VA % PRED: 100 %
DL/VA: 4.89 ml/min/mmHg/L
DLCO unc % pred: 45 %
DLCO unc: 17.59 ml/min/mmHg
FEF 25-75 Post: 0.47 L/sec
FEF 25-75 Pre: 0.43 L/sec
FEF2575-%CHANGE-POST: 7 %
FEF2575-%PRED-PRE: 14 %
FEF2575-%Pred-Post: 15 %
FEV1-%CHANGE-POST: 3 %
FEV1-%PRED-POST: 31 %
FEV1-%PRED-PRE: 30 %
FEV1-Post: 1.09 L
FEV1-Pre: 1.05 L
FEV1FVC-%Change-Post: 0 %
FEV1FVC-%Pred-Pre: 58 %
FEV6-%CHANGE-POST: 1 %
FEV6-%Pred-Post: 51 %
FEV6-%Pred-Pre: 50 %
FEV6-PRE: 2.24 L
FEV6-Post: 2.28 L
FEV6FVC-%Change-Post: -2 %
FEV6FVC-%PRED-PRE: 99 %
FEV6FVC-%Pred-Post: 96 %
FVC-%Change-Post: 4 %
FVC-%Pred-Post: 53 %
FVC-%Pred-Pre: 51 %
FVC-PRE: 2.35 L
FVC-Post: 2.46 L
POST FEV1/FVC RATIO: 44 %
Post FEV6/FVC ratio: 93 %
Pre FEV1/FVC ratio: 45 %
Pre FEV6/FVC Ratio: 95 %
RV % pred: 181 %
RV: 4.99 L
TLC % pred: 95 %
TLC: 7.65 L

## 2014-10-25 MED ORDER — UMECLIDINIUM-VILANTEROL 62.5-25 MCG/INH IN AEPB: INHALATION_SPRAY | RESPIRATORY_TRACT | Status: DC

## 2014-10-25 NOTE — Assessment & Plan Note (Signed)
Doubt he's really committed to stopping smoking if he can't afford chantix because he really can't afford to smoke.  > 3 min discussion  I emphasized that although we never turn away smokers from the pulmonary clinic, we do ask that they understand that the recommendations that we make  won't work nearly as well in the presence of continued cigarette exposure.  In fact, we may very well  reach a point where we can't promise to help the patient if he/she can't quit smoking. (We can and will promise to try to help, we just can't promise what we recommend will really work)

## 2014-10-25 NOTE — Assessment & Plan Note (Signed)
-   PFT's 10/10/10 FEV1 2.43 (69%) ratio 59 and no better p B2, DLC0 79%  - PFTs 10/25/2014  FEV1 1.05 (30%)  Ratio 45 no better and corrects to 100% and dlco 45   DDX of  difficult airways management all start with A and  include Adherence, Ace Inhibitors, Acid Reflux, Active Sinus Disease, Alpha 1 Antitripsin deficiency, Anxiety masquerading as Airways dz,  ABPA,  allergy(esp in young), Aspiration (esp in elderly), Adverse effects of DPI,  Active smokers, plus two Bs  = Bronchiectasis and Beta blocker use..and one C= CHF  Adherence is always the initial "prime suspect" and is a multilayered concern that requires a "trust but verify" approach in every patient - starting with knowing how to use medications, especially inhalers, correctly, keeping up with refills and understanding the fundamental difference between maintenance and prns vs those medications only taken for a very short course and then stopped and not refilled.  - continues to struggle with adherence  - try simplify with anoro one puff daily and prn duoneb  ? Allergy/asthma component :  Note previously had dramatic response to prednisone and may do worse if stop ics altogether but on the other hand may not need ics if addresses bronchitic component by stopping smoking  Active smoking > sep a/p

## 2014-10-25 NOTE — Progress Notes (Signed)
Subjective:    Patient ID: Richard Hopkins, male    DOB: 1946/03/02  MRN: [id number]    Brief patient profile:  17 wobm still smoking with gradually worse doe x since early 2000's   03/15/2008 Pulmonary eval, rec stop smoking, return if more sob or cough   August 30, 2010 Pulmonary consult for COPD...pt c/o fatigue for over 6 months...imp cxr from 08/20/10 reviewed c/w  mild PF but the number one concern in smokers is RBILD vs DIP, both directly linked to smoking and no w/u needs to be considered at this point. DLCO is normal. If there is convincing progression of dyspnea or desaturation with exercise first step is stop smoking then consider HRCT if not improving and pulmonary f/u prn  03/07/13 pulmonary consultation /Richard Hopkins requested by Dr Richard Hopkins re abd CT with MPN cc minimal increase doe x 2 years, really not limiting activity assoc with dry cough.  rec Work on inhaler technique Return  for pfts> did not return    09/13/2014 f/u ov/Richard Hopkins re: ? PF / ? Copd  Chief Complaint  Patient presents with  . Follow-up    Pt states having increased SOB and congestion x 1 wk- relates to weather change. He c/o no energy and fatigue for "quite a while" and seems to be getting worse.   Sedentary/ Not limited by breathing from desired activities    Using symbicort about every other day with poor mdi rec chantix starter pack is the best option to reduce or eliminate smoking> could not afford it.   10/25/2014 f/u ov/Richard Hopkins re: GOLD III/IV copd and still smoking  Chief Complaint  Patient presents with  . Follow-up    PFT done today. Pt states that his breathing has been worse since last visit. He has been cutting back on smoking, and feels that this is making breathing worse. He also c/o increased cough with clear sputum for the past 1 to 2 wks. He has been using neb daily for the past 3 days.     Not really consistent with symbicort but thinks saba really helps him  Cough mostly in ams > not purulent   Doe x > slow adls    No obvious day to day or daytime variabilty or assoc cp or chest tightness, subjective wheeze overt sinus or hb symptoms. No unusual exp hx or h/o childhood pna/ asthma or knowledge of premature birth.  Sleeping ok without nocturnal  or early am exacerbation  of respiratory  c/o's or need for noct saba. Also denies any obvious fluctuation of symptoms with weather or environmental changes or other aggravating or alleviating factors except as outlined above   Current Medications, Allergies, Complete Past Medical History, Past Surgical History, Family History, and Social History were reviewed in [employer] record.  ROS  The following are not active complaints unless bolded sore throat, dysphagia, dental problems, itching, sneezing,  nasal congestion or excess/ purulent secretions, ear ache,   fever, chills, sweats, unintended wt loss, pleuritic or exertional cp, hemoptysis,  orthopnea pnd or leg swelling, presyncope, palpitations, heartburn, abdominal pain, anorexia, nausea, vomiting, diarrhea  or change in bowel or urinary habits, change in stools or urine, dysuria,hematuria,  rash, arthralgias, visual complaints, headache, numbness weakness or ataxia or problems with walking or coordination,  change in mood/affect or memory.        Allergies (verified):  1) ! Lisinopril (Lisinopril)  2) ! Ace Inhibitors     Past Medical History:  COPD............................................Marland KitchenWert  -  2009 FEV1 baseline of 1.8 > course of prednisone with an FEV1 up to 2.45 with a ratio of 57%  - ? 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  Allergic rhinitis  Benign prostatic hypertrophy              Objective:   Physical Exam  Somber amb bm nad wt 223 August 30, 2010 > 224 October 12, 2010 > 216 03/07/2013  > 09/13/2014  198 > 10/25/2014 202    HEENT mild turbinate edema. Dentures full Oropharynx no thrush or excess pnd or cobblestoning. No JVD or cervical adenopathy. Mild accessory muscle hypertrophy. Trachea midline, nl thryroid. Chest was hyperinflated by percussion with diminished breath sounds and moderate increased exp time with MID expiratory bilateral wheeze. Hoover sign positive at mid inspiration. Regular rate and rhythm without murmur gallop or rub or increase P2. Abd: no hsm, nl excursion. Ext warm without cyanosis clubbing or edema.   cxr 03/01/13  No evidence of pulmonary mass.  2. However, there are multiple nonspecific pulmonary nodules in  the lungs bilaterally, as above. The largest of these measures 1  cm in the right lower lobe (image 38 of series 3). Many of these  nodules are slightly cavitary with thick walls. The overall  appearance is suggestive of potential smoking related disease such  as pulmonary Langerhans cell histiocytosis. Clinical correlation  is recommended with a strong consideration for smoking cessation,  as this can be rapidly progressive and destructive lung disease if  smoking cessation is not implemented. Additionally, the  possibility of malignancy is not excluded, and follow up evaluation  with repeat chest CT in 3-6 months is recommended to evaluate for  the stability of these findings.   3. 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 Attention on follow-up studies is recommended to assess for  temporal progression of disease.  4. Atherosclerosis, including left main and left circumflex  coronary artery disease. Assessment for potential risk factor  modification, dietary therapy or pharmacologic therapy may be  warranted, if clinically indicated.  5.  Severe circumferential thickening of the distal esophagus.  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 No  definite acute overlying pulmonary process     Assessment & Plan:

## 2014-10-25 NOTE — Patient Instructions (Addendum)
Stop symbiocort for now and try anoro one click each am x 2 deep drags  The key is to stop smoking completely before smoking completely stops you!   mucinex up to 1200 mg every 12 hours as needed for congestion  Please schedule a follow up office visit in 6 weeks, call sooner if needed  Add:  If worsens Pred 20 mg per day until better then 10 mg per day until seen

## 2014-10-25 NOTE — Progress Notes (Signed)
PFT done today. Katie Welchel,CMA  

## 2014-10-26 ENCOUNTER — Telehealth: Payer: Self-pay | Admitting: *Deleted

## 2014-10-26 MED ORDER — PREDNISONE 10 MG PO TABS: ORAL_TABLET | ORAL | Status: DC

## 2014-10-26 NOTE — Telephone Encounter (Signed)
-----   Message from Tanda Rockers, MD sent at 10/26/2014  6:47 AM EST ----- Add:  If worsens Pred 20 mg per day until better then 10 mg per day until seen (call in #[phone] so he has them handy in case he needs to start them)

## 2014-10-26 NOTE — Telephone Encounter (Signed)
I called and spoke with the pt and notified of recs per MW  He verbalized understanding  Rx for pred was sent

## 2014-11-09 ENCOUNTER — Encounter: Payer: Self-pay | Admitting: Internal Medicine

## 2014-11-09 ENCOUNTER — Ambulatory Visit (INDEPENDENT_AMBULATORY_CARE_PROVIDER_SITE_OTHER): Payer: Medicare Other | Admitting: Internal Medicine

## 2014-11-09 VITALS — BP 120/82 | HR 87 | Temp 98.1°F | Ht 75.0 in | Wt 204.0 lb

## 2014-11-09 DIAGNOSIS — E0801 Diabetes mellitus due to underlying condition with hyperosmolarity with coma: Secondary | ICD-10-CM

## 2014-11-09 DIAGNOSIS — R351 Nocturia: Secondary | ICD-10-CM

## 2014-11-09 DIAGNOSIS — I10 Essential (primary) hypertension: Secondary | ICD-10-CM

## 2014-11-09 DIAGNOSIS — M79645 Pain in left finger(s): Secondary | ICD-10-CM

## 2014-11-09 MED ORDER — DICLOFENAC SODIUM 1 % TD GEL: 4.0000 g | Freq: Four times a day (QID) | TRANSDERMAL | Status: DC

## 2014-11-09 MED ORDER — TAMSULOSIN HCL 0.4 MG PO CAPS: 0.4000 mg | ORAL_CAPSULE | Freq: Every day | ORAL | Status: DC

## 2014-11-09 MED ORDER — IPRATROPIUM-ALBUTEROL 0.5-2.5 (3) MG/3ML IN SOLN: 3.0000 mL | Freq: Four times a day (QID) | RESPIRATORY_TRACT | Status: DC | PRN

## 2014-11-09 NOTE — Patient Instructions (Addendum)
Please take all new medication as prescribed - the voltaren gel, and flomax  You will be contacted regarding the referral for: urology  Please continue all other medications as before, and refills have been done if requested.  Please have the pharmacy call with any other refills you may need.  Please keep your appointments with your specialists as you may have planned  Please go to the LAB in the Basement (turn left off the elevator) for the tests to be done  You will be contacted by phone if any changes need to be made immediately.  Otherwise, you will receive a letter about your results with an explanation, but please check with MyChart first.  Please remember to sign up for MyChart if you have not done so, as this will be important to you in the future with finding out test results, communicating by private email, and scheduling acute appointments online when needed.  Please return in 6 months, or sooner if needed

## 2014-11-09 NOTE — Progress Notes (Signed)
Subjective:    Patient ID: Richard Hopkins, male    DOB: 09-11-1946, 68 y.o.   MRN: [id number]  HPI  Here to f/u, c/0 1 wk onset sharp pain and swelling mild overall to left thumb base, without recent overuse, trauma or hx of gout. No fever.  Nothing seems to make better or worse.   Also menitons recurring vision blurriness and asks for sugar check,  Pt denies polydipsia, polyuria, Pt states overall good compliance with meds, trying to follow lower cholesterol, diabetic diet, wt overall stable.  Does also have nocturia x 4-5 times per night for several wks, but Denies urinary symptoms such as dysuria, frequency, urgency, flank pain, hematuria or n/v, fever, chills. Had been better with flomax before, ran out, asks to re-start. Past Medical History  Diagnosis Date  . THRUSH   . ADENOMATOUS COLONIC POLYP   . DIABETES MELLITUS, TYPE II   . HYPERLIPIDEMIA   . SMOKER   . BLURRED VISION   . HYPERTENSION   . ALLERGIC RHINITIS   . CHRONIC OBSTRUCTIVE PULMONARY DISEASE, ACUTE EXACERBATION   . COPD   . PULMONARY FIBROSIS   . ESOPHAGEAL STRICTURE   . GERD   . DIVERTICULAR DISEASE   . BENIGN PROSTATIC HYPERTROPHY   . Acute prostatitis   . EPIDIDYMO-ORCHITIS   . SKIN LESION   . LUMBAR DISC DISORDER   . Pain in soft tissues of limb   . MUSCLE CRAMPS   . FATIGUE   . MASS, SUPERFICIAL   . DYSPNEA   . Wheezing   . DYSPNEA/SHORTNESS OF BREATH   . Nocturia   . PSA, INCREASED   . ABNORMAL ELECTROCARDIOGRAM   . ERECTILE DYSFUNCTION, ORGANIC, HX OF   . SYNCOPE   . Brain tumor   . COPD (chronic obstructive pulmonary disease)   . Chronic steroid use    Past Surgical History  Procedure Laterality Date  . Back surgery      x 4 disabled after 2003  . Elbow arthroscopy      reports that he has been smoking Cigarettes.  He has a 50 pack-year smoking history. He has never used smokeless tobacco. He reports that he does not drink alcohol or use illicit drugs. family history includes Asthma in  his mother; Diabetes in his mother; Emphysema in his mother. There is no history of Colon cancer. Allergies  Allergen Reactions  . Doxycycline Nausea Only and Other (See Comments)    Loss of memory; "bad feeling," dizziness  . Ace Inhibitors Cough  . Cardura [Doxazosin Mesylate] Other (See Comments)    dizziness  . Cozaar [Losartan Potassium] Other (See Comments)    dizziness  . Dexilant [Dexlansoprazole] Itching and Rash  . Hydrocodone Nausea Only and Other (See Comments)    dizziness  . Lisinopril Cough  . Alfuzosin Rash   Review of Systems  Constitutional: Negative for unusual diaphoresis or other sweats  HENT: Negative for ringing in ear Eyes: Negative for double vision or worsening visual disturbance.  Respiratory: Negative for choking and stridor.   Gastrointestinal: Negative for vomiting or other signifcant bowel change Genitourinary: Negative for hematuria or decreased urine volume.  Musculoskeletal: Negative for other MSK pain or swelling Skin: Negative for color change and worsening wound.  Neurological: Negative for tremors and numbness other than noted  Psychiatric/Behavioral: Negative for decreased concentration or agitation other than above       Objective:   Physical Exam BP 120/82 mmHg  Pulse 87  Temp(Src) 98.1  F (36.7 C) (Oral)  Ht 6\' 3"  (1.905 m)  Wt 204 lb (92.534 kg)  BMI 25.50 kg/m2  SpO2 95% VS noted, not ill appaering Constitutional: Pt appears well-developed, well-nourished.  HENT: Head: NCAT.  Right Ear: External ear normal.  Left Ear: External ear normal.  Eyes: . Pupils are equal, round, and reactive to light. Conjunctivae and EOM are normal Neck: Normal range of motion. Neck supple.  Cardiovascular: Normal rate and regular rhythm.   Pulmonary/Chest: Effort normal and breath sounds normal.  Left thumb base with tender PIP and soft tissue swelling involving the PIP and the thenar eminence, mild tender diffusely, no specific abscess and hand  o/w neurovasc intact Neurological: Pt is alert. Not confused , motor grossly intact Skin: Skin is warm. No rash Psychiatric: Pt behavior is normal. No agitation.     Assessment & Plan:

## 2014-11-09 NOTE — Assessment & Plan Note (Signed)
stable overall by history and exam, recent data reviewed with pt, and pt to continue medical treatment as before,  to f/u any worsening symptoms or concerns Lab Results  Component Value Date   HGBA1C 6.4 04/20/2014   For fu labs

## 2014-11-09 NOTE — Progress Notes (Signed)
Pre visit review using our clinic review tool, if applicable. No additional management support is needed unless otherwise documented below in the visit note. 

## 2014-11-09 NOTE — Assessment & Plan Note (Signed)
Declines UA for now, for re-start flomax, refer urology, likely BPH  to f/u any worsening symptoms or concerns

## 2014-11-09 NOTE — Assessment & Plan Note (Signed)
stable overall by history and exam, recent data reviewed with pt, and pt to continue medical treatment as before,  to f/u any worsening symptoms or concerns BP Readings from Last 3 Encounters:  11/09/14 120/82  10/25/14 140/84  09/28/14 132/82

## 2014-11-09 NOTE — Assessment & Plan Note (Signed)
C/w prob DJD flare, for voltaren gel prn,  to f/u any worsening symptoms or concerns

## 2014-11-20 ENCOUNTER — Telehealth: Payer: Self-pay | Admitting: Oncology

## 2014-11-20 ENCOUNTER — Other Ambulatory Visit: Payer: Self-pay | Admitting: *Deleted

## 2014-11-20 ENCOUNTER — Telehealth: Payer: Self-pay | Admitting: *Deleted

## 2014-11-20 DIAGNOSIS — D68318 Other hemorrhagic disorder due to intrinsic circulating anticoagulants, antibodies, or inhibitors: Secondary | ICD-10-CM

## 2014-11-20 NOTE — Telephone Encounter (Signed)
Per Dr. Benay Spice : OK for CBC and PTT and to be seen by Selena Lesser, NP tomorrow or 12/2. Forwarded message to NP to advise on when to schedule him with her.

## 2014-11-20 NOTE — Telephone Encounter (Signed)
Has developed a "spot" on his forearm just below elbow that is size of pencil eraser and reports it is discolored and looks like the marks he developed before he went to hospital for his bleeding problem. Does not recall any trauma here and no other areas have this discoloration. None on legs or abdomen, but he adds that his legs feel weak. Asking to be seen by someone to evaluate. Will forward message to MD.

## 2014-11-20 NOTE — Telephone Encounter (Signed)
Pt called to schedule apt w/MD, states he has a spot on his arm that he had before and wanted Dr. Benay Spice to see it while it clear enough to be spotted, sent pt to MD/desk nurse to leave msg to get order for pt to come in to be seen. KJ

## 2014-11-21 ENCOUNTER — Ambulatory Visit (HOSPITAL_BASED_OUTPATIENT_CLINIC_OR_DEPARTMENT_OTHER): Payer: Medicare Other | Admitting: Nurse Practitioner

## 2014-11-21 ENCOUNTER — Other Ambulatory Visit (HOSPITAL_BASED_OUTPATIENT_CLINIC_OR_DEPARTMENT_OTHER): Payer: Medicare Other

## 2014-11-21 ENCOUNTER — Encounter: Payer: Self-pay | Admitting: Nurse Practitioner

## 2014-11-21 DIAGNOSIS — D68318 Other hemorrhagic disorder due to intrinsic circulating anticoagulants, antibodies, or inhibitors: Secondary | ICD-10-CM

## 2014-11-21 DIAGNOSIS — D66 Hereditary factor VIII deficiency: Secondary | ICD-10-CM

## 2014-11-21 LAB — CBC WITH DIFFERENTIAL/PLATELET
BASO%: 0.3 % (ref 0.0–2.0)
BASOS ABS: 0 10*3/uL (ref 0.0–0.1)
EOS%: 3.2 % (ref 0.0–7.0)
Eosinophils Absolute: 0.4 10*3/uL (ref 0.0–0.5)
HEMATOCRIT: 42.9 % (ref 38.4–49.9)
HEMOGLOBIN: 13.1 g/dL (ref 13.0–17.1)
LYMPH%: 13.9 % — AB (ref 14.0–49.0)
MCH: 25.7 pg — AB (ref 27.2–33.4)
MCHC: 30.6 g/dL — ABNORMAL LOW (ref 32.0–36.0)
MCV: 83.9 fL (ref 79.3–98.0)
MONO#: 1.3 10*3/uL — ABNORMAL HIGH (ref 0.1–0.9)
MONO%: 11.7 % (ref 0.0–14.0)
NEUT#: 8 10*3/uL — ABNORMAL HIGH (ref 1.5–6.5)
NEUT%: 70.9 % (ref 39.0–75.0)
PLATELETS: 279 10*3/uL (ref 140–400)
RBC: 5.11 10*6/uL (ref 4.20–5.82)
RDW: 15.2 % — ABNORMAL HIGH (ref 11.0–14.6)
WBC: 11.3 10*3/uL — ABNORMAL HIGH (ref 4.0–10.3)
lymph#: 1.6 10*3/uL (ref 0.9–3.3)

## 2014-11-21 LAB — APTT: aPTT: 27.9 seconds (ref 24–37)

## 2014-11-21 NOTE — Assessment & Plan Note (Signed)
Patient is status post Rituxan last received 03/13/2014 for diagnosis of factor VIII inhibitor.  He is no longer on prednisone.  Patient noticed one small area bruising to his left forearm earlier this week.  He denies any known injury or trauma.  He denies any other areas of bruising or issues with bleeding whatsoever.  He denies any night sweats, fevers, or chills.  CBC obtained today was within normal range.  Still pending are the PTT and factor VIII assay results.  Advised patient that we would call him first thing in the morning once these lab results are available.  Also advised patient to call/return or go directly to the emergency department for any worsening symptoms whatsoever.   Patient has plans to followup with Dr. Melvyn Novas on the center 16 2015.  He has plans to return to the [facility] for labs and a followup visit on 3/22 2016.

## 2014-11-21 NOTE — Progress Notes (Signed)
will   [facility]   HPI: Richard Hopkins 68 y.o. male diagnosed with factor VIII inhibitor.  Patient is status post Rituxan/prednisone therapy.  Patient called the cancer Center today requesting urgent care visit.  He noticed a newly formed left forearm bruise this week.  He denies any known injury or trauma to this area.  He denies any other bruising or active bleeding issues.  He denies any night sweats, fevers, or chills.   HPI  CURRENT THERAPY: No active treatment plan for patient.   ROS  Past Medical History  Diagnosis Date  . THRUSH   . ADENOMATOUS COLONIC POLYP   . DIABETES MELLITUS, TYPE II   . HYPERLIPIDEMIA   . SMOKER   . BLURRED VISION   . HYPERTENSION   . ALLERGIC RHINITIS   . CHRONIC OBSTRUCTIVE PULMONARY DISEASE, ACUTE EXACERBATION   . COPD   . PULMONARY FIBROSIS   . ESOPHAGEAL STRICTURE   . GERD   . DIVERTICULAR DISEASE   . BENIGN PROSTATIC HYPERTROPHY   . Acute prostatitis   . EPIDIDYMO-ORCHITIS   . SKIN LESION   . LUMBAR DISC DISORDER   . Pain in soft tissues of limb   . MUSCLE CRAMPS   . FATIGUE   . MASS, SUPERFICIAL   . DYSPNEA   . Wheezing   . DYSPNEA/SHORTNESS OF BREATH   . Nocturia   . PSA, INCREASED   . ABNORMAL ELECTROCARDIOGRAM   . ERECTILE DYSFUNCTION, ORGANIC, HX OF   . SYNCOPE   . Brain tumor   . COPD (chronic obstructive pulmonary disease)   . Chronic steroid use     Past Surgical History  Procedure Laterality Date  . Back surgery      x 4 disabled after 2003  . Elbow arthroscopy      has ADENOMATOUS COLONIC POLYP; Diabetes; HYPERLIPIDEMIA; SMOKER; Essential hypertension; ALLERGIC RHINITIS; COPD GOLD III/IV ; PULMONARY FIBROSIS; ESOPHAGEAL STRICTURE; GERD; DIVERTICULAR DISEASE; BENIGN PROSTATIC HYPERTROPHY; LUMBAR DISC DISORDER; FATIGUE; Nocturia; PSA, INCREASED; ABNORMAL ELECTROCARDIOGRAM; ERECTILE DYSFUNCTION, ORGANIC, HX OF; SYNCOPE; Preventative health care; Neck pain; LUE weakness; Brain tumor; Scrotal  rash; Skin lesion; Thumb pain, left; Prepatellar bursitis of left knee; Thrush; Chronic steroid use; Pulmonary nodules/lesions, multiple; CAD, multiple vessel; Rib pain on right side; Left leg pain; Bruising; Factor VIII inhibitor disorder; Acute blood loss anemia; Leukocytosis, unspecified; Lumbar radiculopathy; and Unspecified constipation on his problem list.     is allergic to doxycycline; ace inhibitors; cardura; cozaar; dexilant; hydrocodone; lisinopril; and alfuzosin.    Medication List       This list is accurate as of: 11/21/14  2:31 PM.  Always use your most recent med list.               amLODipine 5 MG tablet  Commonly known as:  NORVASC  Take 1 tablet (5 mg total) by mouth daily.     cloNIDine 0.1 MG tablet  Commonly known as:  CATAPRES  Take 1 tablet (0.1 mg total) by mouth 3 (three) times daily.     diphenhydramine-acetaminophen 25-500 MG Tabs  Commonly known as:  TYLENOL PM  Take 2 tablets by mouth at bedtime as needed (sleep and pain).     ipratropium-albuterol 0.5-2.5 (3) MG/3ML Soln  Commonly known as:  DUONEB  Take 3 mLs by nebulization every 6 (six) hours as needed (wheezing/shortness of breath).     lovastatin 20 MG tablet  Commonly known as:  MEVACOR  Take 40 mg by mouth daily at  6 PM.     metFORMIN 500 MG tablet  Commonly known as:  GLUCOPHAGE  Take 1,000 mg by mouth 2 (two) times daily with a meal.     omeprazole 40 MG capsule  Commonly known as:  PRILOSEC  Take 1 capsule by mouth every afternoon.     predniSONE 10 MG tablet  Commonly known as:  DELTASONE  10 to 20 mg daily as directed     sucralfate 1 G tablet  Commonly known as:  CARAFATE  Take 1 tablet (1 g total) by mouth 2 (two) times daily.     tamsulosin 0.4 MG Caps capsule  Commonly known as:  FLOMAX  Take 1 capsule (0.4 mg total) by mouth daily.     Umeclidinium-Vilanterol 62.5-25 MCG/INH Aepb  Commonly known as:  ANORO ELLIPTA  Only open the device one time and then take your  two separate drags to be sure you get it all         PHYSICAL EXAMINATION  Blood pressure 146/78, pulse 86, temperature 98.1 F (36.7 C), temperature source Oral, resp. rate 18, height 6\' 3"  (1.905 m), weight 210 lb (95.255 kg).  Physical Exam  Constitutional: He is oriented to person, place, and time and well-developed, well-nourished, and in no distress.  HENT:  Head: Normocephalic and atraumatic.  Eyes: Conjunctivae and EOM are normal. Pupils are equal, round, and reactive to light. Right eye exhibits no discharge. Left eye exhibits no discharge. No scleral icterus.  Neck: Normal range of motion. No JVD present.  Musculoskeletal: Normal range of motion. He exhibits no edema or tenderness.  Neurological: He is alert and oriented to person, place, and time. Gait normal.  Skin: Skin is warm and dry. No rash noted. No erythema.  Patient has approximately 1.5 cm in diameter bruise to his left forearm.  No other active bleeding or bruising noted on exam.  Psychiatric: Affect normal.  Nursing note and vitals reviewed.   LABORATORY DATA:. Appointment on 11/21/2014  Component Date Value Ref Range Status  . WBC 11/21/2014 11.3* 4.0 - 10.3 10e3/uL Final  . NEUT# 11/21/2014 8.0* 1.5 - 6.5 10e3/uL Final  . HGB 11/21/2014 13.1  13.0 - 17.1 g/dL Final  . HCT 11/21/2014 42.9  38.4 - 49.9 % Final  . Platelets 11/21/2014 279  140 - 400 10e3/uL Final  . MCV 11/21/2014 83.9  79.3 - 98.0 fL Final  . MCH 11/21/2014 25.7* 27.2 - 33.4 pg Final  . MCHC 11/21/2014 30.6* 32.0 - 36.0 g/dL Final  . RBC 11/21/2014 5.11  4.20 - 5.82 10e6/uL Final  . RDW 11/21/2014 15.2* 11.0 - 14.6 % Final  . lymph# 11/21/2014 1.6  0.9 - 3.3 10e3/uL Final  . MONO# 11/21/2014 1.3* 0.1 - 0.9 10e3/uL Final  . Eosinophils Absolute 11/21/2014 0.4  0.0 - 0.5 10e3/uL Final  . Basophils Absolute 11/21/2014 0.0  0.0 - 0.1 10e3/uL Final  . NEUT% 11/21/2014 70.9  39.0 - 75.0 % Final  . LYMPH% 11/21/2014 13.9* 14.0 - 49.0 % Final    . MONO% 11/21/2014 11.7  0.0 - 14.0 % Final  . EOS% 11/21/2014 3.2  0.0 - 7.0 % Final  . BASO% 11/21/2014 0.3  0.0 - 2.0 % Final     RADIOGRAPHIC STUDIES: No results found.  ASSESSMENT/PLAN:    Factor VIII inhibitor disorder Patient is status post Rituxan last received 03/13/2014 for diagnosis of factor VIII inhibitor.  He is no longer on prednisone.  Patient noticed one small area bruising  to his left forearm earlier this week.  He denies any known injury or trauma.  He denies any other areas of bruising or issues with bleeding whatsoever.  He denies any night sweats, fevers, or chills.  CBC obtained today was within normal range.  Still pending are the PTT and factor VIII assay results.  Advised patient that we would call him first thing in the morning once these lab results are available.  Also advised patient to call/return or go directly to the emergency department for any worsening symptoms whatsoever.   Patient has plans to followup with Dr. Melvyn Novas on the center 16 2015.  He has plans to return to the [facility] for labs and a followup visit on 3/22 2016.   Patient stated understanding of all instructions; and was in agreement with this plan of care. The patient knows to call the clinic with any problems, questions or concerns.   Review/collaboration with Dr. Benay Spice regarding all aspects of patient's visit today.   Total time spent with patient was 15 minutes;  with greater than 80 percent of that time spent in face to face counseling regarding his symptoms, and coordination of care and follow up.  Disclaimer: This note was dictated with voice recognition software. Similar sounding words can inadvertently be transcribed and may not be corrected upon review.   Drue Second, NP 11/21/2014

## 2014-11-22 LAB — FACTOR 8 ASSAY: Coagulation Factor VIII: 216 % — ABNORMAL HIGH (ref 73–140)

## 2014-11-23 ENCOUNTER — Telehealth: Payer: Self-pay

## 2014-11-23 NOTE — Telephone Encounter (Signed)
S/w pt his labs were OK. PTT normal and factor 8 mildly elevated. Pt expressed relief the bruise is not related to factor 8.

## 2014-12-06 ENCOUNTER — Ambulatory Visit: Payer: Medicare Other | Admitting: Internal Medicine

## 2014-12-14 ENCOUNTER — Encounter: Payer: Self-pay | Admitting: Internal Medicine

## 2014-12-18 ENCOUNTER — Other Ambulatory Visit: Payer: Self-pay | Admitting: Internal Medicine

## 2015-01-08 ENCOUNTER — Other Ambulatory Visit: Payer: Self-pay | Admitting: Internal Medicine

## 2015-01-12 ENCOUNTER — Ambulatory Visit: Payer: Self-pay | Admitting: Internal Medicine

## 2015-02-09 ENCOUNTER — Telehealth: Payer: Self-pay | Admitting: Nurse Practitioner

## 2015-02-09 NOTE — Telephone Encounter (Signed)
Pt called to cancel will c/b to r/s

## 2015-02-12 ENCOUNTER — Ambulatory Visit: Payer: Medicare Other | Admitting: Nurse Practitioner

## 2015-02-12 ENCOUNTER — Other Ambulatory Visit: Payer: Medicare Other

## 2015-02-14 ENCOUNTER — Encounter: Payer: Self-pay | Admitting: Internal Medicine

## 2015-02-14 ENCOUNTER — Ambulatory Visit (INDEPENDENT_AMBULATORY_CARE_PROVIDER_SITE_OTHER): Payer: Medicare Other | Admitting: Internal Medicine

## 2015-02-14 ENCOUNTER — Other Ambulatory Visit (INDEPENDENT_AMBULATORY_CARE_PROVIDER_SITE_OTHER): Payer: Medicare Other

## 2015-02-14 VITALS — BP 142/98 | HR 78 | Temp 98.0°F | Resp 18 | Ht 75.0 in | Wt 222.1 lb

## 2015-02-14 VITALS — BP 170/100 | HR 92 | Ht 75.0 in | Wt 222.8 lb

## 2015-02-14 DIAGNOSIS — E785 Hyperlipidemia, unspecified: Secondary | ICD-10-CM

## 2015-02-14 DIAGNOSIS — E119 Type 2 diabetes mellitus without complications: Secondary | ICD-10-CM | POA: Diagnosis not present

## 2015-02-14 DIAGNOSIS — Z0189 Encounter for other specified special examinations: Secondary | ICD-10-CM

## 2015-02-14 DIAGNOSIS — F17210 Nicotine dependence, cigarettes, uncomplicated: Secondary | ICD-10-CM

## 2015-02-14 DIAGNOSIS — I10 Essential (primary) hypertension: Secondary | ICD-10-CM | POA: Diagnosis not present

## 2015-02-14 DIAGNOSIS — N401 Enlarged prostate with lower urinary tract symptoms: Secondary | ICD-10-CM | POA: Diagnosis not present

## 2015-02-14 DIAGNOSIS — F17200 Nicotine dependence, unspecified, uncomplicated: Secondary | ICD-10-CM

## 2015-02-14 DIAGNOSIS — J449 Chronic obstructive pulmonary disease, unspecified: Secondary | ICD-10-CM

## 2015-02-14 DIAGNOSIS — R351 Nocturia: Secondary | ICD-10-CM

## 2015-02-14 DIAGNOSIS — Z720 Tobacco use: Secondary | ICD-10-CM | POA: Diagnosis not present

## 2015-02-14 DIAGNOSIS — Z0000 Encounter for general adult medical examination without abnormal findings: Secondary | ICD-10-CM

## 2015-02-14 LAB — LIPID PANEL
Cholesterol: 169 mg/dL (ref 0–200)
HDL: 87.7 mg/dL (ref >39.00)
LDL Cholesterol: 61 mg/dL (ref 0–99)
NonHDL: 81.3
Total CHOL/HDL Ratio: 2
Triglycerides: 101 mg/dL (ref 0.0–149.0)
VLDL: 20.2 mg/dL (ref 0.0–40.0)

## 2015-02-14 LAB — BASIC METABOLIC PANEL
BUN: 16 mg/dL (ref 6–23)
CALCIUM: 9.1 mg/dL (ref 8.4–10.5)
CO2: 29 mEq/L (ref 19–32)
Chloride: 103 mEq/L (ref 96–112)
Creatinine, Ser: 1.17 mg/dL (ref 0.40–1.50)
GFR: 79.02 mL/min (ref >60.00)
Glucose, Bld: 111 mg/dL — ABNORMAL HIGH (ref 70–99)
POTASSIUM: 4.1 meq/L (ref 3.5–5.1)
Sodium: 137 mEq/L (ref 135–145)

## 2015-02-14 LAB — HEPATIC FUNCTION PANEL
ALT: 9 U/L (ref 0–53)
AST: 12 U/L (ref 0–37)
Albumin: 3.8 g/dL (ref 3.5–5.2)
Alkaline Phosphatase: 50 U/L (ref 39–117)
BILIRUBIN TOTAL: 0.4 mg/dL (ref 0.2–1.2)
Bilirubin, Direct: 0.1 mg/dL (ref 0.0–0.3)
TOTAL PROTEIN: 6.5 g/dL (ref 6.0–8.3)

## 2015-02-14 LAB — GLUCOSE, POCT (MANUAL RESULT ENTRY): POC Glucose: 127 mg/dl — AB (ref 70–99)

## 2015-02-14 LAB — HEMOGLOBIN A1C: HEMOGLOBIN A1C: 6.6 % — AB (ref 4.6–6.5)

## 2015-02-14 MED ORDER — TAMSULOSIN HCL 0.4 MG PO CAPS: 0.4000 mg | ORAL_CAPSULE | Freq: Every day | ORAL | Status: DC

## 2015-02-14 MED ORDER — HYDROCHLOROTHIAZIDE 25 MG PO TABS: 25.0000 mg | ORAL_TABLET | Freq: Every day | ORAL | Status: DC

## 2015-02-14 NOTE — Patient Instructions (Addendum)
Work on inhaler technique:  relax and gently blow all the way out then take a nice smooth deep breath back in, triggering the inhaler at same time you start breathing in.  Hold for up to 5 seconds if you can.  Rinse and gargle with water when done  See Tammy before sample runs out with your drug formulary review and choose a cheaper option for your copd medication  For now, use symbicort 160 Take 2 puffs first thing in am and then another 2 puffs about 12 hours later and only use your nebulizer as a backup   Prednisone should be 10 mg pill take 2 daily until better, then one daily x 5 days  And stop   The key is to stop smoking completely before smoking completely stops you!

## 2015-02-14 NOTE — Assessment & Plan Note (Signed)
Intolerant to mult meds, suspect anxiety a contributing factor, d/c amlod 5 qd, change to HCT 25 qd, f/u 1 mo

## 2015-02-14 NOTE — Progress Notes (Signed)
Pre visit review using our clinic review tool, if applicable. No additional management support is needed unless otherwise documented below in the visit note. 

## 2015-02-14 NOTE — Assessment & Plan Note (Signed)
For re-start flomax, consider urology referral

## 2015-02-14 NOTE — Patient Instructions (Signed)
Please take all new medication as prescribed - the fluid pill for blood pressure - Hydrochlorothiazide 25 mg  Please continue all other medications as before, and refills have been done if requested - the flomax  Please have the pharmacy call with any other refills you may need.  Please keep your appointments with your specialists as you may have planned  Please go to the LAB in the Basement (turn left off the elevator) for the tests to be done today  You will be contacted by phone if any changes need to be made immediately.  Otherwise, you will receive a letter about your results with an explanation, but please check with MyChart first.  Please remember to sign up for MyChart if you have not done so, as this will be important to you in the future with finding out test results, communicating by private email, and scheduling acute appointments online when needed.  Please return in 6 months, or sooner if needed, with Lab testing done 3-5 days before

## 2015-02-14 NOTE — Assessment & Plan Note (Signed)
stable overall by history and exam, recent data reviewed with pt, and pt to continue medical treatment as before,  to f/u any worsening symptoms or concerns Lab Results  Component Value Date   HGBA1C 6.4 04/20/2014   For f/u lab

## 2015-02-14 NOTE — Progress Notes (Signed)
Subjective:    Patient ID: Richard Hopkins, male    DOB: [birth year], 69 y.o.   MRN: [id number]  HPI  Here tof/u BP, stopped his amlodipine due to dizziness and bad taste in mouth.  Has stopped mult other meds for dizziness as well.  Had stopped his flomax and did not realize he has refills at [facility]. Now with nocturia x 3-4 times per night, was  Better before with flomax, and was taking ok even with alfusosin intolerance in past Pt denies chest pain, increased sob or doe, wheezing, orthopnea, PND, increased LE swelling, palpitations, dizziness or syncope.  Denies urinary symptoms such as dysuria, frequency, urgency, flank pain, hematuria or n/v, fever, chills. Has appt later today with pulm.  Pt denies polydipsia, polyuria, or low sugar symptoms such as weakness or confusion improved with po intake.  Pt states overall good compliance with meds, trying to follow lower cholesterol diet, wt overall stable but little exercise however.     Past Medical History  Diagnosis Date  . THRUSH   . ADENOMATOUS COLONIC POLYP   . DIABETES MELLITUS, TYPE II   . HYPERLIPIDEMIA   . SMOKER   . BLURRED VISION   . HYPERTENSION   . ALLERGIC RHINITIS   . CHRONIC OBSTRUCTIVE PULMONARY DISEASE, ACUTE EXACERBATION   . COPD   . PULMONARY FIBROSIS   . ESOPHAGEAL STRICTURE   . GERD   . DIVERTICULAR DISEASE   . BENIGN PROSTATIC HYPERTROPHY   . Acute prostatitis   . EPIDIDYMO-ORCHITIS   . SKIN LESION   . LUMBAR DISC DISORDER   . Pain in soft tissues of limb   . MUSCLE CRAMPS   . FATIGUE   . MASS, SUPERFICIAL   . DYSPNEA   . Wheezing   . DYSPNEA/SHORTNESS OF BREATH   . Nocturia   . PSA, INCREASED   . ABNORMAL ELECTROCARDIOGRAM   . ERECTILE DYSFUNCTION, ORGANIC, HX OF   . SYNCOPE   . Brain tumor   . COPD (chronic obstructive pulmonary disease)   . Chronic steroid use    Past Surgical History  Procedure Laterality Date  . Back surgery      x 4 disabled after 2003  . Elbow arthroscopy      reports  that he has been smoking Cigarettes.  He has a 50 pack-year smoking history. He has never used smokeless tobacco. He reports that he does not drink alcohol or use illicit drugs. family history includes Asthma in his mother; Diabetes in his mother; Emphysema in his mother. There is no history of Colon cancer. Allergies  Allergen Reactions  . Doxycycline Nausea Only and Other (See Comments)    Loss of memory; "bad feeling," dizziness  . Ace Inhibitors Cough  . Cardura [Doxazosin Mesylate] Other (See Comments)    dizziness  . Cozaar [Losartan Potassium] Other (See Comments)    dizziness  . Dexilant [Dexlansoprazole] Itching and Rash  . Hydrocodone Nausea Only and Other (See Comments)    dizziness  . Lisinopril Cough  . Norvasc [Amlodipine Besylate] Other (See Comments)    3 times dizzy, bad taste in mouth, none since stopped  . Alfuzosin Rash   Current Outpatient Prescriptions on File Prior to Visit  Medication Sig Dispense Refill  . amLODipine (NORVASC) 5 MG tablet Take 1 tablet (5 mg total) by mouth daily. 90 tablet 3  . cloNIDine (CATAPRES) 0.1 MG tablet Take 1 tablet (0.1 mg total) by mouth 3 (three) times daily. 270 tablet 3  .  diphenhydramine-acetaminophen (TYLENOL PM) 25-500 MG TABS Take 2 tablets by mouth at bedtime as needed (sleep and pain).    Marland Kitchen ipratropium-albuterol (DUONEB) 0.5-2.5 (3) MG/3ML SOLN Take 3 mLs by nebulization every 6 (six) hours as needed (wheezing/shortness of breath). 60 mL 11  . lovastatin (MEVACOR) 20 MG tablet Take 40 mg by mouth daily at 6 PM.    . lovastatin (MEVACOR) 20 MG tablet TAKE TWO TABLETS BY MOUTH ONCE DAILY IN THE EVENING 180 tablet 1  . metFORMIN (GLUCOPHAGE) 500 MG tablet Take 1,000 mg by mouth 2 (two) times daily with a meal.    . omeprazole (PRILOSEC) 40 MG capsule Take 1 capsule by mouth every afternoon. 30 capsule 6  . predniSONE (DELTASONE) 10 MG tablet TAKE ONE TO TWO TABLETS BY MOUTH ONCE DAILY AS DIRECTED 60 tablet 0  . sucralfate  (CARAFATE) 1 G tablet Take 1 tablet (1 g total) by mouth 2 (two) times daily. 60 tablet 3  . Umeclidinium-Vilanterol (ANORO ELLIPTA) 62.5-25 MCG/INH AEPB Only open the device one time and then take your two separate drags to be sure you get it all 1 each 11   No current facility-administered medications on file prior to visit.    Review of Systems  Constitutional: Negative for unusual diaphoresis or other sweats  HENT: Negative for ringing in ear Eyes: Negative for double vision or worsening visual disturbance.  Respiratory: Negative for choking and stridor.   Gastrointestinal: Negative for vomiting or other signifcant bowel change Genitourinary: Negative for hematuria or decreased urine volume.  Musculoskeletal: Negative for other MSK pain or swelling Skin: Negative for color change and worsening wound.  Neurological: Negative for tremors and numbness other than noted  Psychiatric/Behavioral: Negative for decreased concentration or agitation other than above       Objective:   Physical Exam BP 142/98 mmHg  Pulse 78  Temp(Src) 98 F (36.7 C) (Oral)  Resp 18  Ht 6\' 3"  (1.905 m)  Wt 222 lb 1.9 oz (100.753 kg)  BMI 27.76 kg/m2  SpO2 97% VS noted,  Constitutional: Pt appears well-developed, well-nourished.  HENT: Head: NCAT.  Right Ear: External ear normal.  Left Ear: External ear normal.  Eyes: . Pupils are equal, round, and reactive to light. Conjunctivae and EOM are normal Neck: Normal range of motion. Neck supple.  Cardiovascular: Normal rate and regular rhythm.   Pulmonary/Chest: Effort normal and breath sounds without rales or wheezing.  Abd:  Soft, NT, ND, + BS Neurological: Pt is alert. Not confused , motor grossly intact Skin: Skin is warm. No rash, no LE edema Psychiatric: Pt behavior is normal. No agitation.   BP Readings from Last 3 Encounters:  02/14/15 142/98  11/21/14 146/78  11/09/14 120/82       Assessment & Plan:

## 2015-02-14 NOTE — Assessment & Plan Note (Signed)
stable overall by history and exam, recent data reviewed with pt, and pt to continue medical treatment as before,  to f/u any worsening symptoms or concerns Lab Results  Component Value Date   LDLCALC 71 04/20/2014   For fu labs, cont diet

## 2015-02-14 NOTE — Progress Notes (Signed)
Subjective:    Patient ID: Richard Hopkins, male    DOB: 10-10-1946  MRN: [id number]    Brief patient profile:  62 wobm still smoking with gradually worse doe x since early 2000's with documented GOLD III/IV copd 10/2014     History of Present Illness  03/15/2008 Pulmonary eval, rec stop smoking, return if more sob or cough   August 30, 2010 Pulmonary consult for COPD...pt c/o fatigue for over 6 months...imp cxr from 08/20/10 reviewed c/w  mild PF but the number one concern in smokers is RBILD vs DIP, both directly linked to smoking and no w/u needs to be considered at this point. DLCO is normal. If there is convincing progression of dyspnea or desaturation with exercise first step is stop smoking then consider HRCT if not improving and pulmonary f/u prn  03/07/13 pulmonary consultation /Jasilyn Holderman requested by Dr Marshall Cork re abd CT with MPN cc minimal increase doe x 2 years, really not limiting activity assoc with dry cough.  rec Work on inhaler technique Return  for pfts> did not return    09/13/2014 f/u ov/[place] re: ? PF / ? Copd  Chief Complaint  Patient presents with  . Follow-up    Pt states having increased SOB and congestion x 1 wk- relates to weather change. He c/o no energy and fatigue for "quite a while" and seems to be getting worse.   Sedentary/ Not limited by breathing from desired activities   Using symbicort about every other day with poor mdi rec chantix starter pack is the best option to reduce or eliminate smoking> could not afford it.   10/25/2014 f/u ov/Philomena Buttermore re: GOLD III/IV copd and still smoking  Chief Complaint  Patient presents with  . Follow-up    PFT done today. Pt states that his breathing has been worse since last visit. He has been cutting back on smoking, and feels that this is making breathing worse. He also c/o increased cough with clear sputum for the past 1 to 2 wks. He has been using neb daily for the past 3 days.     Not really consistent with symbicort  but thinks saba really helps him  Cough mostly in ams > not purulent  Doe x > slow adls  rec Stop symbiocort for now and try anoro one click each am x 2 deep drags The key is to stop smoking completely before smoking completely stops you!  mucinex up to 1200 mg every 12 hours as needed for congestion Please schedule a follow up office visit in 6 weeks, call sooner if needed  Add:  If worsens Pred 20 mg per day until better then 10 mg per day until seen      02/14/2015 f/u ov/Leathia Farnell re: GOLD III/ IV copd / still smoking  Chief Complaint  Patient presents with  . Follow-up    Pt states that his breathing has overall been doing well. He liked the Anoro but ins does not cover. He is using symbicort about 2 x per wk. He has not needed neb.   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 No unusual exp hx or h/o childhood pna/ asthma or knowledge of premature birth.  Sleeping ok without nocturnal  or early am exacerbation  of respiratory  c/o's or need for noct saba. Also denies any obvious fluctuation of symptoms with weather or environmental changes or other aggravating or alleviating factors except as outlined above   Current Medications, Allergies, Complete Past Medical History, Past Surgical History, Family History, and Social History were reviewed in [employer] record.  ROS  The following are not active complaints unless bolded sore throat, dysphagia, dental problems, itching, sneezing,  nasal congestion or excess/ purulent secretions, ear ache,   fever, chills, sweats, unintended wt loss, pleuritic or exertional cp, hemoptysis,  orthopnea pnd or leg swelling, presyncope, palpitations, heartburn, abdominal pain, anorexia, nausea, vomiting, diarrhea   or change in bowel or urinary habits, change in stools or urine, dysuria,hematuria,  rash, arthralgias, visual complaints, headache, numbness weakness or ataxia or problems with walking or coordination,  change in mood/affect or memory.        Allergies (verified):  1) ! Lisinopril (Lisinopril)  2) ! Ace Inhibitors     Past Medical History:  COPD............................................Marland KitchenWert  - 2009 FEV1 baseline of 1.8 > course of prednisone with an FEV1 up to 2.45 with a ratio of 57%  - ? 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  Allergic rhinitis  Benign prostatic hypertrophy              Objective:   Physical Exam  Somber amb bm nad wt 223 August 30, 2010 > 224 October 12, 2010 > 216 03/07/2013  > 09/13/2014  198 > 10/25/2014 202 > 02/14/2015 223    HEENT mild turbinate edema. Dentures full Oropharynx no thrush or excess pnd or cobblestoning. No JVD or cervical adenopathy. Mild accessory muscle hypertrophy. Trachea midline, nl thryroid. Chest was hyperinflated by percussion with diminished breath sounds and moderate increased exp time with MID expiratory bilateral wheeze. Hoover sign positive at mid inspiration. Regular rate and rhythm without murmur gallop or rub or increase P2. Abd: no hsm, nl excursion. Ext warm without cyanosis clubbing or edema.        Assessment & Plan:

## 2015-02-15 NOTE — Assessment & Plan Note (Addendum)
-   PFT's 10/10/10 FEV1 2.43 (69%) ratio 59 and no better p B2, DLC0 79%  - PFTs 10/25/2014  FEV1 1.05 (30%)  Ratio 45 no better and corrects to 100% and dlco 45  - 10/25/2014 trial of anoro and as needed duoneb > improved but insurance wouldn't cover  The proper method of use, as well as anticipated side effects, of a metered-dose inhaler are discussed and demonstrated to the patient. Improved effectiveness after extensive coaching during this visit to a level of approximately  75%  I had an extended discussion with the patient reviewing all relevant studies completed to date and  lasting 15 to 20 minutes of a 25 minute visit on the following ongoing concerns:   1) critical he stop smoking > see sep a/p  2) needs to understand formulary issues/ reviewed  3) using prednisone as a last resort, reviewed  See instructions for specific recommendations which were reviewed directly with the patient who was given a copy with highlighter outlining the key components.

## 2015-02-15 NOTE — Assessment & Plan Note (Signed)

## 2015-02-20 ENCOUNTER — Telehealth: Payer: Self-pay | Admitting: Internal Medicine

## 2015-02-20 MED ORDER — PREDNISONE 10 MG PO TABS: ORAL_TABLET | ORAL | Status: DC

## 2015-02-20 NOTE — Telephone Encounter (Signed)
Pt calling, states that Pred 10mg  tablets were supposed to be called into pharmacy at last OV 02/14/15.  Please advise Dr Melvyn Novas the quantity you would like to send. Thanks.   Patient Instructions     Work on inhaler technique: relax and gently blow all the way out then take a nice smooth deep breath back in, triggering the inhaler at same time you start breathing in. Hold for up to 5 seconds if you can. Rinse and gargle with water when done  See Tammy before sample runs out with your drug formulary review and choose a cheaper option for your copd medication  For now, use symbicort 160 Take 2 puffs first thing in am and then another 2 puffs about 12 hours later and only use your nebulizer as a backup   Prednisone should be 10 mg pill take 2 daily until better, then one daily x 5 days And stop   The key is to stop smoking completely before smoking completely stops you!

## 2015-02-20 NOTE — Telephone Encounter (Signed)
This was for prn use as last resort only  Ok to call in #30 but will need to regroup with all meds in hand before we do more refill on this

## 2015-02-20 NOTE — Telephone Encounter (Signed)
Pt is aware that this has been sent in but will need ROV if further fills are needed. Nothing further was needed.

## 2015-03-01 ENCOUNTER — Encounter: Payer: Self-pay | Admitting: Adult Health

## 2015-03-01 ENCOUNTER — Ambulatory Visit (INDEPENDENT_AMBULATORY_CARE_PROVIDER_SITE_OTHER): Payer: Medicare Other | Admitting: Adult Health

## 2015-03-01 VITALS — BP 148/90 | HR 80 | Temp 98.1°F | Ht 75.0 in | Wt 225.6 lb

## 2015-03-01 DIAGNOSIS — J449 Chronic obstructive pulmonary disease, unspecified: Secondary | ICD-10-CM | POA: Diagnosis not present

## 2015-03-01 NOTE — Patient Instructions (Addendum)
Begin Pulmicort and Perforomist Neb Twice daily  , rinse after use.  Must quit smoking  follow up Dr. Melvyn Novas  In 3 months and As needed

## 2015-03-01 NOTE — Assessment & Plan Note (Signed)
Recent AECOPD now resolving  Unable to afford inhaler w/ rx coverage   Plan  Begin Pulmicort and Perforomist Neb Twice daily  , rinse after use.  Must quit smoking  follow up Dr. Melvyn Novas  In 3 months and As needed

## 2015-03-01 NOTE — Progress Notes (Signed)
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 DLCO is normal. If there is convincing progression of dyspnea or desaturation with exercise first step is stop smoking then consider HRCT if not improving and pulmonary f/u prn  03/07/13 pulmonary consultation /[name] requested by Dr Marshall Cork re abd CT with MPN cc minimal increase doe x 2 years, really not limiting activity assoc with dry cough.  rec Work on inhaler technique Return  for pfts> did not return    09/13/2014 f/u ov/[name] re: ? PF / ? Copd  Chief Complaint  Patient presents with  . Follow-up    Pt states having increased SOB and congestion x 1 wk- relates to weather change. He c/o no energy and fatigue for "quite a while" and seems to be getting worse.   Sedentary/ Not limited by breathing from desired activities   Using symbicort about every other day with poor mdi rec chantix starter pack is the best option to reduce or eliminate smoking> could not afford it.   10/25/2014 f/u ov/[name] re: GOLD III/IV copd and still smoking  Chief Complaint  Patient presents with  . Follow-up    PFT done today. Pt states that his breathing has been worse since last visit. He has been cutting back on smoking, and feels that this is making breathing worse. He also c/o increased cough with clear sputum for the past 1 to 2 wks. He has been using neb daily for the past 3 days.     Not really consistent with symbicort  but thinks saba really helps him  Cough mostly in ams > not purulent  Doe x > slow adls  rec Stop symbiocort for now and try anoro one click each am x 2 deep drags The key is to stop smoking completely before smoking completely stops you!  mucinex up to 1200 mg every 12 hours as needed for congestion Please schedule a follow up office visit in 6 weeks, call sooner if needed  Add:  If worsens Pred 20 mg per day until better then 10 mg per day until seen      02/14/2015 f/u ov/[name] re: GOLD III/ IV copd / still smoking  Chief Complaint  Patient presents with  . Follow-up    Pt states that his breathing has overall been doing well. He liked the Anoro but ins does not cover. He is using symbicort about 2 x per wk. He has not needed neb.   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 Treated with a prednisone taper and started  on Symbicort. She continues to smoke, smoking cessation was discussed. He says he is feeling improved with decreased cough and shortness of breath. He denies any hemoptysis, chest pain, orthopnea, PND or leg swelling. He does not have insurance for rx , he can not afford inhalers.  We discussed neb meds.     Current Medications, Allergies, Complete Past Medical History, Past Surgical History, Family History, and Social History were reviewed in [employer] [facility] record.  ROS  The following are not active complaints unless bolded sore throat, dysphagia, dental problems, itching, sneezing,  nasal congestion or excess/ purulent secretions, ear ache,   fever, chills, sweats, unintended wt loss, pleuritic or exertional cp, hemoptysis,  orthopnea pnd or leg swelling,  presyncope, palpitations, heartburn, abdominal pain, anorexia, nausea, vomiting, diarrhea  or change in bowel or urinary habits, change in stools or urine, dysuria,hematuria,  rash, arthralgias, visual complaints, headache, numbness weakness or ataxia or problems with walking or coordination,  change in mood/affect or memory.        Allergies (verified):  1) ! Lisinopril (Lisinopril)  2) ! Ace Inhibitors     Past Medical History:  COPD............................................[facility] KitchenWert  - 2009 FEV1 baseline of 1.8 > course of prednisone with an FEV1 up to 2.45 with a ratio of 57%  - ? 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  Allergic rhinitis  Benign prostatic hypertrophy              Objective:   Physical Exam  Somber amb bm nad wt 223 August 30, 2010 > 224 October 12, 2010 > 216 03/07/2013  > 09/13/2014  198 > 10/25/2014 202 > 02/14/2015 223 >225 03/01/2015    HEENT mild turbinate edema. Dentures full Oropharynx no thrush or excess pnd or cobblestoning. No JVD or cervical adenopathy. Mild accessory muscle hypertrophy. Trachea midline, nl thryroid. Chest was hyperinflated by percussion with diminished breath sounds and moderate increased exp time with , decreased BS in bases . Hoover sign positive at mid inspiration. Regular rate and rhythm without murmur gallop or rub or increase P2. Abd: no hsm, nl excursion. Ext warm without cyanosis clubbing or edema.        Assessment & Plan:

## 2015-03-08 ENCOUNTER — Telehealth: Payer: Self-pay | Admitting: Internal Medicine

## 2015-03-08 MED ORDER — BUDESONIDE 0.25 MG/2ML IN SUSP: 0.2500 mg | Freq: Two times a day (BID) | RESPIRATORY_TRACT | Status: DC

## 2015-03-08 MED ORDER — FORMOTEROL FUMARATE 20 MCG/2ML IN NEBU: 20.0000 ug | INHALATION_SOLUTION | Freq: Two times a day (BID) | RESPIRATORY_TRACT | Status: DC

## 2015-03-08 MED ORDER — OMEPRAZOLE 40 MG PO CPDR: DELAYED_RELEASE_CAPSULE | ORAL | Status: DC

## 2015-03-08 NOTE — Addendum Note (Signed)
Addended by: Parke Poisson E on: 03/08/2015 04:50 PM   Modules accepted: Orders

## 2015-03-08 NOTE — Telephone Encounter (Signed)
Sent Rx for Omeprazole (PRILOSEC), 40 mg, #30 with 6 refills to [facility] on 03/08/15.

## 2015-03-08 NOTE — Telephone Encounter (Signed)
Sent R

## 2015-03-09 DIAGNOSIS — J449 Chronic obstructive pulmonary disease, unspecified: Secondary | ICD-10-CM | POA: Diagnosis not present

## 2015-03-12 ENCOUNTER — Telehealth: Payer: Self-pay | Admitting: Internal Medicine

## 2015-03-13 ENCOUNTER — Telehealth: Payer: Self-pay | Admitting: *Deleted

## 2015-03-13 MED ORDER — SUCRALFATE 1 G PO TABS: 1.0000 g | ORAL_TABLET | Freq: Two times a day (BID) | ORAL | Status: DC

## 2015-03-13 MED ORDER — DEXLANSOPRAZOLE 60 MG PO CPDR: 60.0000 mg | DELAYED_RELEASE_CAPSULE | Freq: Every day | ORAL | Status: DC

## 2015-03-13 NOTE — Telephone Encounter (Signed)
Sent Rx for sucralfate (CARAFATE), 1 gm, #60 with 3 refills to [facility] in [place], [place]. Talked to the patient at 4:45 pm (03/13/15) to tell him the pharmacy did verify with me that this is the "Big White Pill" he was taking last year and this was the correct Rx to give to him. Told the patient the Rx was sent and they could pickup. Patient stated they understood.

## 2015-03-13 NOTE — Telephone Encounter (Signed)
Sent Rx for Dexilant, 60 mg, #30 with 4 refills to [facility] in [place], [place] on 03/13/15. Talked to the patient today and told him his Rx was sent. Patient stated that the omeprazole does not work well for him. Patient stated he understood.

## 2015-04-03 ENCOUNTER — Ambulatory Visit (INDEPENDENT_AMBULATORY_CARE_PROVIDER_SITE_OTHER): Payer: Medicare Other | Admitting: Internal Medicine

## 2015-04-03 ENCOUNTER — Other Ambulatory Visit (INDEPENDENT_AMBULATORY_CARE_PROVIDER_SITE_OTHER): Payer: Medicare Other

## 2015-04-03 VITALS — BP 132/80 | HR 83 | Temp 98.1°F | Resp 18 | Ht 75.0 in | Wt 212.0 lb

## 2015-04-03 DIAGNOSIS — J309 Allergic rhinitis, unspecified: Secondary | ICD-10-CM | POA: Diagnosis not present

## 2015-04-03 DIAGNOSIS — E119 Type 2 diabetes mellitus without complications: Secondary | ICD-10-CM | POA: Diagnosis not present

## 2015-04-03 DIAGNOSIS — Z0000 Encounter for general adult medical examination without abnormal findings: Secondary | ICD-10-CM

## 2015-04-03 DIAGNOSIS — R5383 Other fatigue: Secondary | ICD-10-CM | POA: Diagnosis not present

## 2015-04-03 DIAGNOSIS — Z79899 Other long term (current) drug therapy: Secondary | ICD-10-CM

## 2015-04-03 DIAGNOSIS — I10 Essential (primary) hypertension: Secondary | ICD-10-CM

## 2015-04-03 DIAGNOSIS — N401 Enlarged prostate with lower urinary tract symptoms: Secondary | ICD-10-CM

## 2015-04-03 DIAGNOSIS — J449 Chronic obstructive pulmonary disease, unspecified: Secondary | ICD-10-CM

## 2015-04-03 DIAGNOSIS — Z23 Encounter for immunization: Secondary | ICD-10-CM | POA: Diagnosis not present

## 2015-04-03 DIAGNOSIS — E785 Hyperlipidemia, unspecified: Secondary | ICD-10-CM | POA: Diagnosis not present

## 2015-04-03 DIAGNOSIS — M109 Gout, unspecified: Secondary | ICD-10-CM

## 2015-04-03 LAB — MICROALBUMIN / CREATININE URINE RATIO
Creatinine,U: 399.5 mg/dL
MICROALB UR: 2.5 mg/dL — AB (ref 0.0–1.9)
Microalb Creat Ratio: 0.6 mg/g (ref 0.0–30.0)

## 2015-04-03 LAB — CBC WITH DIFFERENTIAL/PLATELET
BASOS ABS: 0.1 10*3/uL (ref 0.0–0.1)
Basophils Relative: 0.6 % (ref 0.0–3.0)
Eosinophils Absolute: 1.9 10*3/uL — ABNORMAL HIGH (ref 0.0–0.7)
Eosinophils Relative: 20.6 % — ABNORMAL HIGH (ref 0.0–5.0)
HEMATOCRIT: 44.6 % (ref 39.0–52.0)
Hemoglobin: 14.6 g/dL (ref 13.0–17.0)
LYMPHS PCT: 15.5 % (ref 12.0–46.0)
Lymphs Abs: 1.4 10*3/uL (ref 0.7–4.0)
MCHC: 32.7 g/dL (ref 30.0–36.0)
MCV: 79.9 fl (ref 78.0–100.0)
MONOS PCT: 11.4 % (ref 3.0–12.0)
Monocytes Absolute: 1 10*3/uL (ref 0.1–1.0)
Neutro Abs: 4.7 10*3/uL (ref 1.4–7.7)
Neutrophils Relative %: 51.9 % (ref 43.0–77.0)
Platelets: 303 10*3/uL (ref 150.0–400.0)
RBC: 5.59 Mil/uL (ref 4.22–5.81)
RDW: 15.4 % (ref 11.5–15.5)
WBC: 9 10*3/uL (ref 4.0–10.5)

## 2015-04-03 LAB — URINALYSIS, ROUTINE W REFLEX MICROSCOPIC
HGB URINE DIPSTICK: NEGATIVE
KETONES UR: NEGATIVE
Leukocytes, UA: NEGATIVE
Nitrite: NEGATIVE
RBC / HPF: NONE SEEN (ref 0–?)
Specific Gravity, Urine: >=1.03 — AB (ref 1.000–1.030)
Total Protein, Urine: NEGATIVE
URINE GLUCOSE: NEGATIVE
UROBILINOGEN UA: 0.2 (ref 0.0–1.0)
pH: 5.5 (ref 5.0–8.0)

## 2015-04-03 LAB — BASIC METABOLIC PANEL
BUN: 9 mg/dL (ref 6–23)
CO2: 28 mEq/L (ref 19–32)
CREATININE: 1.21 mg/dL (ref 0.40–1.50)
Calcium: 9.3 mg/dL (ref 8.4–10.5)
Chloride: 103 mEq/L (ref 96–112)
GFR: 75.99 mL/min (ref >60.00)
Glucose, Bld: 95 mg/dL (ref 70–99)
POTASSIUM: 3.7 meq/L (ref 3.5–5.1)
Sodium: 138 mEq/L (ref 135–145)

## 2015-04-03 LAB — LIPID PANEL
CHOL/HDL RATIO: 2
Cholesterol: 125 mg/dL (ref 0–200)
HDL: 51.8 mg/dL (ref >39.00)
LDL CALC: 54 mg/dL (ref 0–99)
NonHDL: 73.2
TRIGLYCERIDES: 96 mg/dL (ref 0.0–149.0)
VLDL: 19.2 mg/dL (ref 0.0–40.0)

## 2015-04-03 LAB — TSH: TSH: 0.9 u[IU]/mL (ref 0.35–4.50)

## 2015-04-03 LAB — GLUCOSE, POCT (MANUAL RESULT ENTRY): POC GLUCOSE: 107 mg/dL — AB (ref 70–99)

## 2015-04-03 LAB — HEPATIC FUNCTION PANEL
ALBUMIN: 3.7 g/dL (ref 3.5–5.2)
ALT: 14 U/L (ref 0–53)
AST: 19 U/L (ref 0–37)
Alkaline Phosphatase: 61 U/L (ref 39–117)
Bilirubin, Direct: 0.1 mg/dL (ref 0.0–0.3)
Total Bilirubin: 0.3 mg/dL (ref 0.2–1.2)
Total Protein: 6.5 g/dL (ref 6.0–8.3)

## 2015-04-03 LAB — VITAMIN B12: Vitamin B-12: 815 pg/mL (ref 211–911)

## 2015-04-03 LAB — HEMOGLOBIN A1C: Hgb A1c MFr Bld: 6.4 % (ref 4.6–6.5)

## 2015-04-03 LAB — URIC ACID: Uric Acid, Serum: 7.1 mg/dL (ref 4.0–7.8)

## 2015-04-03 LAB — PSA: PSA: 1.82 ng/mL (ref 0.10–4.00)

## 2015-04-03 LAB — CORTISOL: Cortisol, Plasma: 4.1 ug/dL

## 2015-04-03 MED ORDER — TRIAMCINOLONE ACETONIDE 55 MCG/ACT NA AERO: 2.0000 | INHALATION_SPRAY | Freq: Every day | NASAL | Status: DC

## 2015-04-03 MED ORDER — CETIRIZINE HCL 10 MG PO TABS: 10.0000 mg | ORAL_TABLET | Freq: Every day | ORAL | Status: DC

## 2015-04-03 MED ORDER — METHYLPREDNISOLONE ACETATE 80 MG/ML IJ SUSP
80.0000 mg | Freq: Once | INTRAMUSCULAR | Status: AC
  Administered 2015-04-03: 80 mg via INTRAMUSCULAR

## 2015-04-03 NOTE — Addendum Note (Signed)
Addended by: Valerie Salts on: 04/03/2015 10:34 AM   Modules accepted: Orders

## 2015-04-03 NOTE — Assessment & Plan Note (Signed)
stable overall by history and exam, recent data reviewed with pt, and pt to continue medical treatment as before,  to f/u any worsening symptoms or concerns Lab Results  Component Value Date   HGBA1C 6.6* 02/14/2015    for f/u lab

## 2015-04-03 NOTE — Assessment & Plan Note (Signed)
Mild to mod, for depomedrol IM, zyrtec/nasacort asd, to f/u any worsening symptoms or concerns

## 2015-04-03 NOTE — Progress Notes (Signed)
Subjective:    Patient ID: Richard Hopkins, male    DOB: 1946/04/22, 69 y.o.   MRN: [id number]  HPI  Here for wellness and f/u;  Overall doing ok;  Pt denies Chest pain, worsening SOB, DOE, wheezing, orthopnea, PND, worsening LE edema, palpitations, dizziness or syncope.  Pt denies neurological change such as new headache, facial or extremity weakness.  Pt denies polydipsia, polyuria, or low sugar symptoms. Pt states overall good compliance with treatment and medications, good tolerability, and has been trying to follow appropriate diet.  Pt denies worsening depressive symptoms, suicidal ideation or panic. No fever, night sweats, wt loss, loss of appetite, or other constitutional symptoms.  Pt states good ability with ADL's, has low fall risk, home safety reviewed and adequate, no other significant changes in hearing or vision, and only occasionally active with exercise. Does c/o ongoing fatigue, but denies signficant daytime hypersomnolence. Better with MTn Dew but doesn't last. Just wants to stay in bed all the time.  Not taking his diuretic due to "reaction" with "bad taste in the mouth, liek youre going to have a siezure, with HA and feels like youre going to die." Still taking the clonidine tid 0/1 mg, but willing to change "I'd do anything to have my energy back."  Still trying to quit smoking , now < 1/2 ppd.  Off prednisone now.  Lost 10 lbs in past 10 months due to lower appetite.  Does have several wks ongoing nasal allergy symptoms with clearish congestion, itch and sneezing, without fever, pain, ST, cough, swelling or wheezing. Asks for steroid shot, and tx. Past Medical History  Diagnosis Date  . THRUSH   . ADENOMATOUS COLONIC POLYP   . DIABETES MELLITUS, TYPE II   . HYPERLIPIDEMIA   . SMOKER   . BLURRED VISION   . HYPERTENSION   . ALLERGIC RHINITIS   . CHRONIC OBSTRUCTIVE PULMONARY DISEASE, ACUTE EXACERBATION   . COPD   . PULMONARY FIBROSIS   . ESOPHAGEAL STRICTURE   . GERD   .  DIVERTICULAR DISEASE   . BENIGN PROSTATIC HYPERTROPHY   . Acute prostatitis   . EPIDIDYMO-ORCHITIS   . SKIN LESION   . LUMBAR DISC DISORDER   . Pain in soft tissues of limb   . MUSCLE CRAMPS   . FATIGUE   . MASS, SUPERFICIAL   . DYSPNEA   . Wheezing   . DYSPNEA/SHORTNESS OF BREATH   . Nocturia   . PSA, INCREASED   . ABNORMAL ELECTROCARDIOGRAM   . ERECTILE DYSFUNCTION, ORGANIC, HX OF   . SYNCOPE   . Brain tumor   . COPD (chronic obstructive pulmonary disease)   . Chronic steroid use    Past Surgical History  Procedure Laterality Date  . Back surgery      x 4 disabled after 2003  . Elbow arthroscopy      reports that he has been smoking Cigarettes.  He has a 50 pack-year smoking history. He has never used smokeless tobacco. He reports that he does not drink alcohol or use illicit drugs. family history includes Asthma in his mother; Diabetes in his mother; Emphysema in his mother. There is no history of Colon cancer. Allergies  Allergen Reactions  . Doxycycline Nausea Only and Other (See Comments)    Loss of memory; "bad feeling," dizziness  . Ace Inhibitors Cough  . Cardura [Doxazosin Mesylate] Other (See Comments)    dizziness  . Cozaar [Losartan Potassium] Other (See Comments)    dizziness  .  Dexilant [Dexlansoprazole] Itching and Rash  . Hydrocodone Nausea Only and Other (See Comments)    dizziness  . Lisinopril Cough  . Norvasc [Amlodipine Besylate] Other (See Comments)    3 times dizzy, bad taste in mouth, none since stopped  . Alfuzosin Rash   Current Outpatient Prescriptions on File Prior to Visit  Medication Sig Dispense Refill  . budesonide (PULMICORT) 0.25 MG/2ML nebulizer solution Take 2 mLs (0.25 mg total) by nebulization 2 (two) times daily. DX: J44.9 120 mL 11  . budesonide-formoterol (SYMBICORT) 160-4.5 MCG/ACT inhaler Inhale 2 puffs into the lungs every 12 (twelve) hours as needed.    . cloNIDine (CATAPRES) 0.1 MG tablet Take 1 tablet (0.1 mg total) by  mouth 3 (three) times daily. 270 tablet 3  . dexlansoprazole (DEXILANT) 60 MG capsule Take 1 capsule (60 mg total) by mouth daily. 60 capsule 4  . diphenhydramine-acetaminophen (TYLENOL PM) 25-500 MG TABS Take 2 tablets by mouth at bedtime as needed (sleep and pain).    . formoterol (PERFOROMIST) 20 MCG/2ML nebulizer solution Take 2 mLs (20 mcg total) by nebulization 2 (two) times daily. DX: J44.9 120 mL 11  . ipratropium-albuterol (DUONEB) 0.5-2.5 (3) MG/3ML SOLN Take 3 mLs by nebulization every 6 (six) hours as needed (wheezing/shortness of breath). 60 mL 11  . lovastatin (MEVACOR) 20 MG tablet Take 40 mg by mouth daily at 6 PM.    . metFORMIN (GLUCOPHAGE) 500 MG tablet Take 1,000 mg by mouth 2 (two) times daily with a meal.    . omeprazole (PRILOSEC) 40 MG capsule Take 1 capsule by mouth every afternoon. 30 capsule 6  . predniSONE (DELTASONE) 10 MG tablet TAKE ONE TO TWO TABLETS BY MOUTH ONCE DAILY AS DIRECTED 30 tablet 0  . sucralfate (CARAFATE) 1 G tablet Take 1 tablet (1 g total) by mouth 2 (two) times daily. 60 tablet 3  . tamsulosin (FLOMAX) 0.4 MG CAPS capsule Take 1 capsule (0.4 mg total) by mouth daily. 90 capsule 3   No current facility-administered medications on file prior to visit.    Review of Systems Constitutional: Negative for increased diaphoresis, other activity, appetite or siginficant weight change other than noted HENT: Negative for worsening hearing loss, ear pain, facial swelling, mouth sores and neck stiffness.   Eyes: Negative for other worsening pain, redness or visual disturbance.  Respiratory: Negative for shortness of breath and wheezing  Cardiovascular: Negative for chest pain and palpitations.  Gastrointestinal: Negative for diarrhea, blood in stool, abdominal distention or other pain Genitourinary: Negative for hematuria, flank pain or change in urine volume.  Musculoskeletal: Negative for myalgias or other joint complaints.  Skin: Negative for color change  and wound or drainage.  Neurological: Negative for syncope and numbness. other than noted Hematological: Negative for adenopathy. or other swelling Psychiatric/Behavioral: Negative for hallucinations, SI, self-injury, decreased concentration or other worsening agitation.      Objective:   Physical Exam BP 132/80 mmHg  Pulse 83  Temp(Src) 98.1 F (36.7 C) (Oral)  Resp 18  Ht 6\' 3"  (1.905 m)  Wt 212 lb (96.163 kg)  BMI 26.50 kg/m2  SpO2 93% BP 132/80 mmHg  Pulse 83  Temp(Src) 98.1 F (36.7 C) (Oral)  Resp 18  Ht 6\' 3"  (1.905 m)  Wt 212 lb (96.163 kg)  BMI 26.50 kg/m2  SpO2 93% VS noted,  Constitutional: Pt is oriented to person, place, and time. Appears well-developed and well-nourished, in no significant distress Head: Normocephalic and atraumatic.  Right Ear: External ear normal.  Left Ear: External ear normal.  Nose: Nose normal.  Mouth/Throat: Oropharynx is clear and moist.  Bilat tm's with mild erythema.  Max sinus areas non tender.  Pharynx with mild erythema, no exudate Eyes: Conjunctivae and EOM are normal. Pupils are equal, round, and reactive to light.  Neck: Normal range of motion. Neck supple. No JVD present. No tracheal deviation present or significant neck LA or mass Cardiovascular: Normal rate, regular rhythm, normal heart sounds and intact distal pulses.   Pulmonary/Chest: Effort normal and breath sounds decresaed without rales or wheezing  Abdominal: Soft. Bowel sounds are normal. NT. No HSM  Musculoskeletal: Normal range of motion. Exhibits no edema.  Lymphadenopathy:  Has no cervical adenopathy.  Neurological: Pt is alert and oriented to person, place, and time. Pt has normal reflexes. No cranial nerve deficit. Motor grossly intact Skin: Skin is warm and dry. No rash noted.  Psychiatric:  Has normal mood and affect. Behavior is normal.  Wt Readings from Last 3 Encounters:  04/03/15 212 lb (96.163 kg)  03/01/15 225 lb 9.6 oz (102.331 kg)  02/14/15 222 lb  12.8 oz (101.061 kg)       Assessment & Plan:

## 2015-04-03 NOTE — Patient Instructions (Signed)
You had the Tetenus shot today (Td), and the steroid shot  Please take all new medication as prescribed - the zyrtec and nasacort for allergies  Please continue all other medications as before, and refills have been done if requested.  Please have the pharmacy call with any other refills you may need.  Please continue your efforts at being more active, low cholesterol diet, and weight control.  You are otherwise up to date with prevention measures today.  Please keep your appointments with your specialists as you may have planned  Please go to the LAB in the Basement (turn left off the elevator) for the tests to be done today  You will be contacted by phone if any changes need to be made immediately.  Otherwise, you will receive a letter about your results with an explanation, but please check with MyChart first.  Please remember to sign up for MyChart if you have not done so, as this will be important to you in the future with finding out test results, communicating by private email, and scheduling acute appointments online when needed.  Please return in 6 months, or sooner if needed, with Lab testing done 3-5 days before

## 2015-04-03 NOTE — Progress Notes (Signed)
Pre visit review using our clinic review tool, if applicable. No additional management support is needed unless otherwise documented below in the visit note. 

## 2015-04-03 NOTE — Assessment & Plan Note (Signed)

## 2015-04-03 NOTE — Assessment & Plan Note (Signed)
etiology unclear, for cortisol, SPEP, B12 levels today as well

## 2015-04-03 NOTE — Assessment & Plan Note (Signed)
stable overall by history and exam, recent data reviewed with pt, and pt to continue medical treatment as before,  to f/u any worsening symptoms or concerns SpO2 Readings from Last 3 Encounters:  04/03/15 93%  03/01/15 98%  02/14/15 98%

## 2015-04-04 ENCOUNTER — Encounter: Payer: Self-pay | Admitting: Internal Medicine

## 2015-04-05 LAB — PROTEIN ELECTROPHORESIS, SERUM
ALBUMIN ELP: 3.7 g/dL — AB (ref 3.8–4.8)
ALPHA-1-GLOBULIN: 0.4 g/dL — AB (ref 0.2–0.3)
Alpha-2-Globulin: 0.8 g/dL (ref 0.5–0.9)
BETA 2: 0.4 g/dL (ref 0.2–0.5)
BETA GLOBULIN: 0.4 g/dL (ref 0.4–0.6)
GAMMA GLOBULIN: 1.1 g/dL (ref 0.8–1.7)
Total Protein, Serum Electrophoresis: 6.8 g/dL (ref 6.1–8.1)

## 2015-04-11 ENCOUNTER — Emergency Department (HOSPITAL_COMMUNITY): Payer: Medicare Other

## 2015-04-11 ENCOUNTER — Inpatient Hospital Stay (HOSPITAL_COMMUNITY)
Admission: EM | Admit: 2015-04-11 | Discharge: 2015-04-13 | DRG: 193 | Disposition: A | Discharge status: Home / Self Care | Payer: Medicare Other | Attending: Internal Medicine | Admitting: Internal Medicine

## 2015-04-11 ENCOUNTER — Encounter (HOSPITAL_COMMUNITY): Payer: Self-pay | Admitting: Emergency Medicine

## 2015-04-11 DIAGNOSIS — N400 Enlarged prostate without lower urinary tract symptoms: Secondary | ICD-10-CM | POA: Diagnosis present

## 2015-04-11 DIAGNOSIS — J9601 Acute respiratory failure with hypoxia: Secondary | ICD-10-CM | POA: Diagnosis present

## 2015-04-11 DIAGNOSIS — K5900 Constipation, unspecified: Secondary | ICD-10-CM | POA: Diagnosis not present

## 2015-04-11 DIAGNOSIS — Z888 Allergy status to other drugs, medicaments and biological substances status: Secondary | ICD-10-CM | POA: Diagnosis not present

## 2015-04-11 DIAGNOSIS — E119 Type 2 diabetes mellitus without complications: Secondary | ICD-10-CM | POA: Diagnosis present

## 2015-04-11 DIAGNOSIS — J189 Pneumonia, unspecified organism: Principal | ICD-10-CM

## 2015-04-11 DIAGNOSIS — Z720 Tobacco use: Secondary | ICD-10-CM | POA: Diagnosis present

## 2015-04-11 DIAGNOSIS — F17210 Nicotine dependence, cigarettes, uncomplicated: Secondary | ICD-10-CM | POA: Diagnosis not present

## 2015-04-11 DIAGNOSIS — R0602 Shortness of breath: Secondary | ICD-10-CM | POA: Diagnosis not present

## 2015-04-11 DIAGNOSIS — R059 Cough, unspecified: Secondary | ICD-10-CM

## 2015-04-11 DIAGNOSIS — J8410 Pulmonary fibrosis, unspecified: Secondary | ICD-10-CM | POA: Diagnosis not present

## 2015-04-11 DIAGNOSIS — E785 Hyperlipidemia, unspecified: Secondary | ICD-10-CM | POA: Diagnosis present

## 2015-04-11 DIAGNOSIS — R05 Cough: Secondary | ICD-10-CM | POA: Diagnosis not present

## 2015-04-11 DIAGNOSIS — Z7952 Long term (current) use of systemic steroids: Secondary | ICD-10-CM | POA: Diagnosis not present

## 2015-04-11 DIAGNOSIS — K219 Gastro-esophageal reflux disease without esophagitis: Secondary | ICD-10-CM | POA: Diagnosis not present

## 2015-04-11 DIAGNOSIS — Z79899 Other long term (current) drug therapy: Secondary | ICD-10-CM

## 2015-04-11 DIAGNOSIS — Z86010 Personal history of colonic polyps: Secondary | ICD-10-CM | POA: Diagnosis not present

## 2015-04-11 DIAGNOSIS — I10 Essential (primary) hypertension: Secondary | ICD-10-CM | POA: Diagnosis present

## 2015-04-11 DIAGNOSIS — J441 Chronic obstructive pulmonary disease with (acute) exacerbation: Secondary | ICD-10-CM

## 2015-04-11 DIAGNOSIS — N529 Male erectile dysfunction, unspecified: Secondary | ICD-10-CM | POA: Diagnosis present

## 2015-04-11 DIAGNOSIS — J449 Chronic obstructive pulmonary disease, unspecified: Secondary | ICD-10-CM

## 2015-04-11 LAB — CBC WITH DIFFERENTIAL/PLATELET
BASOS ABS: 0.1 10*3/uL (ref 0.0–0.1)
BASOS PCT: 1 % (ref 0–1)
EOS ABS: 2.6 10*3/uL — AB (ref 0.0–0.7)
EOS PCT: 23 % — AB (ref 0–5)
HEMATOCRIT: 47.4 % (ref 39.0–52.0)
Hemoglobin: 15.3 g/dL (ref 13.0–17.0)
Lymphocytes Relative: 15 % (ref 12–46)
Lymphs Abs: 1.6 10*3/uL (ref 0.7–4.0)
MCH: 26.2 pg (ref 26.0–34.0)
MCHC: 32.3 g/dL (ref 30.0–36.0)
MCV: 81.2 fL (ref 78.0–100.0)
MONO ABS: 1.3 10*3/uL — AB (ref 0.1–1.0)
Monocytes Relative: 11 % (ref 3–12)
Neutro Abs: 5.5 10*3/uL (ref 1.7–7.7)
Neutrophils Relative %: 50 % (ref 43–77)
Platelets: 347 10*3/uL (ref 150–400)
RBC: 5.84 MIL/uL — ABNORMAL HIGH (ref 4.22–5.81)
RDW: 15.1 % (ref 11.5–15.5)
WBC: 11 10*3/uL — ABNORMAL HIGH (ref 4.0–10.5)

## 2015-04-11 LAB — COMPREHENSIVE METABOLIC PANEL
ALT: 13 U/L (ref 0–53)
AST: 22 U/L (ref 0–37)
Albumin: 3.6 g/dL (ref 3.5–5.2)
Alkaline Phosphatase: 71 U/L (ref 39–117)
Anion gap: 10 (ref 5–15)
BUN: 13 mg/dL (ref 6–23)
CO2: 24 mmol/L (ref 19–32)
CREATININE: 1.23 mg/dL (ref 0.50–1.35)
Calcium: 9.3 mg/dL (ref 8.4–10.5)
Chloride: 104 mmol/L (ref 96–112)
GFR, EST AFRICAN AMERICAN: 66 mL/min — AB (ref >90)
GFR, EST NON AFRICAN AMERICAN: 57 mL/min — AB (ref >90)
Glucose, Bld: 130 mg/dL — ABNORMAL HIGH (ref 70–99)
Potassium: 3.7 mmol/L (ref 3.5–5.1)
Sodium: 138 mmol/L (ref 135–145)
Total Bilirubin: 0.7 mg/dL (ref 0.3–1.2)
Total Protein: 7.2 g/dL (ref 6.0–8.3)

## 2015-04-11 LAB — BRAIN NATRIURETIC PEPTIDE: B Natriuretic Peptide: 61.4 pg/mL (ref 0.0–100.0)

## 2015-04-11 LAB — TSH: TSH: 1.195 u[IU]/mL (ref 0.350–4.500)

## 2015-04-11 MED ORDER — DEXTROSE 5 % IV SOLN
1.0000 g | Freq: Once | INTRAVENOUS | Status: AC
  Administered 2015-04-11: 1 g via INTRAVENOUS
  Filled 2015-04-11: qty 10

## 2015-04-11 MED ORDER — PREDNISONE 20 MG PO TABS
40.0000 mg | ORAL_TABLET | Freq: Once | ORAL | Status: AC
  Administered 2015-04-11: 40 mg via ORAL
  Filled 2015-04-11: qty 2

## 2015-04-11 MED ORDER — ALBUTEROL SULFATE (2.5 MG/3ML) 0.083% IN NEBU
5.0000 mg | INHALATION_SOLUTION | Freq: Once | RESPIRATORY_TRACT | Status: AC
  Administered 2015-04-11: 5 mg via RESPIRATORY_TRACT
  Filled 2015-04-11: qty 6

## 2015-04-11 MED ORDER — AZITHROMYCIN 250 MG PO TABS
500.0000 mg | ORAL_TABLET | Freq: Once | ORAL | Status: AC
  Administered 2015-04-11: 500 mg via ORAL
  Filled 2015-04-11: qty 2

## 2015-04-11 NOTE — ED Notes (Signed)
Pt. Maintained 90% on room air during walk. No unusual sob or chest tightness per pt.

## 2015-04-11 NOTE — ED Notes (Signed)
Patient ambulating in hallway with EMT assist.

## 2015-04-11 NOTE — ED Provider Notes (Signed)
CSN: [id number]     Arrival date & time 04/11/15  1911 History   First MD Initiated Contact with Patient 04/11/15 2056     Chief Complaint  Patient presents with  . Shortness of Breath     (Consider location/radiation/quality/duration/timing/severity/associated sxs/prior Treatment) HPI Comments:  the patient is a 69 year old male, he has a history of COPD, presents to the hospital with increased shortness of breath over the last 3 days, gradual onset, persistent, gradually worsening and associated with a cough productive of white phlegm. He has used his nebulizer at home with minimal improvement.  He presents with lab work from the office which was performed 2 weeks ago because of generalized fatigue and malaise over the last year, the lab work is unremarkable.  Patient is a 69 y.o. male presenting with shortness of breath. The history is provided by the patient and medical records.  Shortness of Breath   Past Medical History  Diagnosis Date  . THRUSH   . ADENOMATOUS COLONIC POLYP   . DIABETES MELLITUS, TYPE II   . HYPERLIPIDEMIA   . SMOKER   . BLURRED VISION   . HYPERTENSION   . ALLERGIC RHINITIS   . CHRONIC OBSTRUCTIVE PULMONARY DISEASE, ACUTE EXACERBATION   . COPD   . PULMONARY FIBROSIS   . ESOPHAGEAL STRICTURE   . GERD   . DIVERTICULAR DISEASE   . BENIGN PROSTATIC HYPERTROPHY   . Acute prostatitis   . EPIDIDYMO-ORCHITIS   . SKIN LESION   . LUMBAR DISC DISORDER   . Pain in soft tissues of limb   . MUSCLE CRAMPS   . FATIGUE   . MASS, SUPERFICIAL   . DYSPNEA   . Wheezing   . DYSPNEA/SHORTNESS OF BREATH   . Nocturia   . PSA, INCREASED   . ABNORMAL ELECTROCARDIOGRAM   . ERECTILE DYSFUNCTION, ORGANIC, HX OF   . SYNCOPE   . Brain tumor   . COPD (chronic obstructive pulmonary disease)   . Chronic steroid use    Past Surgical History  Procedure Laterality Date  . Back surgery      x 4 disabled after 2003  . Elbow arthroscopy     Family History  Problem Relation  Age of Onset  . Diabetes Mother   . Colon cancer Neg Hx   . Asthma Mother   . Emphysema Mother     smoker   History  Substance Use Topics  . Smoking status: Current Every Day Smoker -- 1.00 packs/day for 50 years    Types: Cigarettes  . Smokeless tobacco: Never Used  . Alcohol Use: No    Review of Systems  Respiratory: Positive for shortness of breath.   All other systems reviewed and are negative.     Allergies  Doxycycline; Ace inhibitors; Cardura; Cozaar; Dexilant; Hydrocodone; Lisinopril; Norvasc; and Alfuzosin  Home Medications   Prior to Admission medications   Medication Sig Start Date End Date Taking? Authorizing Provider  budesonide (PULMICORT) 0.25 MG/2ML nebulizer solution Take 2 mLs (0.25 mg total) by nebulization 2 (two) times daily. DX: J44.9 03/08/15  Yes Tammy S Parrett, NP  budesonide-formoterol (SYMBICORT) 160-4.5 MCG/ACT inhaler Inhale 2 puffs into the lungs every 12 (twelve) hours as needed.   Yes Historical Provider, MD  cloNIDine (CATAPRES) 0.1 MG tablet Take 1 tablet (0.1 mg total) by mouth 3 (three) times daily. 08/08/14  Yes Biagio Borg, MD  dexlansoprazole (DEXILANT) 60 MG capsule Take 1 capsule (60 mg total) by mouth daily. 03/13/15  Yes  Lafayette Dragon, MD  formoterol (PERFOROMIST) 20 MCG/2ML nebulizer solution Take 2 mLs (20 mcg total) by nebulization 2 (two) times daily. DX: J44.9 03/08/15  Yes Tammy S Parrett, NP  ipratropium-albuterol (DUONEB) 0.5-2.5 (3) MG/3ML SOLN Take 3 mLs by nebulization every 6 (six) hours as needed (wheezing/shortness of breath). 11/09/14  Yes Biagio Borg, MD  lovastatin (MEVACOR) 20 MG tablet Take 40 mg by mouth daily at 6 PM.   Yes Historical Provider, MD  metFORMIN (GLUCOPHAGE) 500 MG tablet Take 1,000 mg by mouth 2 (two) times daily with a meal.   Yes Historical Provider, MD  omeprazole (PRILOSEC) 40 MG capsule Take 1 capsule by mouth every afternoon. 03/08/15  Yes Lafayette Dragon, MD  sucralfate (CARAFATE) 1 G tablet Take 1  tablet (1 g total) by mouth 2 (two) times daily. 03/13/15  Yes Lafayette Dragon, MD  triamcinolone (NASACORT AQ) 55 MCG/ACT AERO nasal inhaler Place 2 sprays into the nose daily. 04/03/15  Yes Biagio Borg, MD  cetirizine (ZYRTEC) 10 MG tablet Take 1 tablet (10 mg total) by mouth daily. Patient not taking: Reported on 04/11/2015 04/03/15   Biagio Borg, MD  tamsulosin [facility]) 0.4 MG CAPS capsule Take 1 capsule (0.4 mg total) by mouth daily. Patient not taking: Reported on 04/11/2015 02/14/15   Biagio Borg, MD   BP 161/94 mmHg  Pulse 96  Temp(Src) 98.1 F (36.7 C) (Oral)  Resp 21  SpO2 91% Physical Exam  Constitutional: He appears well-developed and well-nourished. No distress.  HENT:  Head: Normocephalic and atraumatic.  Mouth/Throat: Oropharynx is clear and moist. No oropharyngeal exudate.  Eyes: Conjunctivae and EOM are normal. Pupils are equal, round, and reactive to light. Right eye exhibits no discharge. Left eye exhibits no discharge. No scleral icterus.  Neck: Normal range of motion. Neck supple. No JVD present. No thyromegaly present.  Cardiovascular: Normal rate, regular rhythm, normal heart sounds and intact distal pulses.  Exam reveals no gallop and no friction rub.   No murmur heard. Pulse of 85 on my exam, no JVD, no peripheral edema  Pulmonary/Chest: Effort normal. No respiratory distress. He has wheezes (mild and expiratory wheezing on forced expiration, no distress). He has no rales.  Abdominal: Soft. Bowel sounds are normal. He exhibits no distension and no mass. There is no tenderness.  Musculoskeletal: Normal range of motion. He exhibits no edema or tenderness.  Lymphadenopathy:    He has no cervical adenopathy.  Neurological: He is alert. Coordination normal.  Skin: Skin is warm and dry. No rash noted. No erythema.  Psychiatric: He has a normal mood and affect. His behavior is normal.  Nursing note and vitals reviewed.   ED Course  Procedures (including critical care  time) Labs Review Labs Reviewed  CBC WITH DIFFERENTIAL/PLATELET - Abnormal; Notable for the following:    WBC 11.0 (*)    RBC 5.84 (*)    Monocytes Absolute 1.3 (*)    Eosinophils Relative 23 (*)    Eosinophils Absolute 2.6 (*)    All other components within normal limits  COMPREHENSIVE METABOLIC PANEL - Abnormal; Notable for the following:    Glucose, Bld 130 (*)    GFR calc non Af Amer 57 (*)    GFR calc Af Amer 66 (*)    All other components within normal limits  BRAIN NATRIURETIC PEPTIDE  TSH    Imaging Review Dg Chest 2 View  04/11/2015   CLINICAL DATA:  69 year old male with a history  of shortness of breath and cough. History of COPD.  EXAM: CHEST - 2 VIEW  COMPARISON:  08/10/2014, CT 11/16/2013, plain film 05/03/2014  FINDINGS: Cardiomediastinal silhouette unchanged in size and contour. Atherosclerotic calcifications of the aortic arch.  Re- demonstration of diffuse interstitial opacities. Stigmata of emphysema, with increased retrosternal airspace, flattened hemidiaphragms, increased AP diameter, and hyperinflation on the AP view.  Persistent thickening of the minor fissure. Nodularity at the periphery of the right lung/minor fissure.  Increased conspicuity of mixed interstitial and airspace opacity of the right lower lung.  No pneumothorax.  No displaced fracture.  Unremarkable appearance of the upper abdomen.  IMPRESSION: Increase conspicuity of airspace and interstitial disease at the right lung base concerning for pneumonia given the history.  Changes of emphysema, with a background of chronic lung changes/fibrosis.  Atherosclerosis.  Signed,  Dulcy Fanny. Earleen Newport, DO  [facility]  [facility]   Electronically Signed   By: Corrie Mckusick D.O.   On: 04/11/2015 21:54     EKG Interpretation   Date/Time:  Wednesday April 11 2015 19:14:26 EDT Ventricular Rate:  107 PR Interval:  190 QRS Duration: 106 QT Interval:  354 QTC Calculation:  472 R Axis:   55 Text Interpretation:  Sinus tachycardia Left ventricular hypertrophy  reporlarization abnormality Abnormal ekg Since last tracing rate faster  question poor R wave progression / q wave V3 Confirmed by Sabra Heck  MD,  Jeananne Bedwell ([id number]) on 04/11/2015 9:00:59 PM      MDM   Final diagnoses:  CAP (community acquired pneumonia)  COPD exacerbation    The patient has nasal congestion, respiratory symptoms consistent with COPD exacerbation possibly brought on by allergies, check a chest x-ray due to the patient's mild hypoxia, 93%, not requiring oxygen, he does have a cough, rule out pneumonia. Albuterol, prednisone, Zithromax, TSH added to the family doctor's labs, patient encouraged to follow-up these results with his primary doctor.  PORT score - is 71   Sat's of 90% on ra  D/w Dr. Shanon Brow - she will admit.  Meds given in ED:  Medications  albuterol (PROVENTIL) (2.5 MG/3ML) 0.083% nebulizer solution 5 mg (5 mg Nebulization Given 04/11/15 2140)  predniSONE (DELTASONE) tablet 40 mg (40 mg Oral Given 04/11/15 2140)  azithromycin (ZITHROMAX) tablet 500 mg (500 mg Oral Given 04/11/15 2141)  cefTRIAXone (ROCEPHIN) 1 g in dextrose 5 % 50 mL IVPB (0 g Intravenous Stopped 04/11/15 2307)         Noemi Chapel, MD 04/11/15 2354

## 2015-04-11 NOTE — ED Notes (Signed)
Patient maintained O2 sat reading of 90% while ambulating on room air. Notified Dr. Sabra Heck. MD acknowledges. No new orders.

## 2015-04-11 NOTE — ED Notes (Addendum)
Pt. reports progressing SOB , productive cough and chest congestion for several days worse today , heavy smoker/COPD , denies chest pain , no fever or chills.

## 2015-04-11 NOTE — ED Notes (Signed)
Dr. Miller at the bedside.  

## 2015-04-11 NOTE — ED Notes (Signed)
Patient states he used his inhaler at home and nebulizer, "it helped some".

## 2015-04-12 DIAGNOSIS — J9601 Acute respiratory failure with hypoxia: Secondary | ICD-10-CM

## 2015-04-12 DIAGNOSIS — I10 Essential (primary) hypertension: Secondary | ICD-10-CM

## 2015-04-12 DIAGNOSIS — J189 Pneumonia, unspecified organism: Principal | ICD-10-CM

## 2015-04-12 DIAGNOSIS — R0602 Shortness of breath: Secondary | ICD-10-CM | POA: Insufficient documentation

## 2015-04-12 DIAGNOSIS — J441 Chronic obstructive pulmonary disease with (acute) exacerbation: Secondary | ICD-10-CM

## 2015-04-12 LAB — BASIC METABOLIC PANEL
Anion gap: 11 (ref 5–15)
BUN: 15 mg/dL (ref 6–23)
CO2: 22 mmol/L (ref 19–32)
CREATININE: 1.16 mg/dL (ref 0.50–1.35)
Calcium: 9 mg/dL (ref 8.4–10.5)
Chloride: 104 mmol/L (ref 96–112)
GFR calc non Af Amer: 62 mL/min — ABNORMAL LOW (ref >90)
GFR, EST AFRICAN AMERICAN: 71 mL/min — AB (ref >90)
GLUCOSE: 217 mg/dL — AB (ref 70–99)
Potassium: 4.8 mmol/L (ref 3.5–5.1)
Sodium: 137 mmol/L (ref 135–145)

## 2015-04-12 LAB — CBC WITH DIFFERENTIAL/PLATELET
BASOS PCT: 0 % (ref 0–1)
Basophils Absolute: 0 10*3/uL (ref 0.0–0.1)
EOS ABS: 0.2 10*3/uL (ref 0.0–0.7)
Eosinophils Relative: 3 % (ref 0–5)
HEMATOCRIT: 45.8 % (ref 39.0–52.0)
Hemoglobin: 14.5 g/dL (ref 13.0–17.0)
Lymphocytes Relative: 10 % — ABNORMAL LOW (ref 12–46)
Lymphs Abs: 0.7 10*3/uL (ref 0.7–4.0)
MCH: 26.1 pg (ref 26.0–34.0)
MCHC: 31.7 g/dL (ref 30.0–36.0)
MCV: 82.4 fL (ref 78.0–100.0)
MONOS PCT: 4 % (ref 3–12)
Monocytes Absolute: 0.3 10*3/uL (ref 0.1–1.0)
NEUTROS ABS: 5.2 10*3/uL (ref 1.7–7.7)
Neutrophils Relative %: 83 % — ABNORMAL HIGH (ref 43–77)
Platelets: 299 10*3/uL (ref 150–400)
RBC: 5.56 MIL/uL (ref 4.22–5.81)
RDW: 15.3 % (ref 11.5–15.5)
WBC: 6.4 10*3/uL (ref 4.0–10.5)

## 2015-04-12 LAB — GLUCOSE, CAPILLARY
GLUCOSE-CAPILLARY: 187 mg/dL — AB (ref 70–99)
Glucose-Capillary: 122 mg/dL — ABNORMAL HIGH (ref 70–99)
Glucose-Capillary: 132 mg/dL — ABNORMAL HIGH (ref 70–99)

## 2015-04-12 LAB — STREP PNEUMONIAE URINARY ANTIGEN: Strep Pneumo Urinary Antigen: NEGATIVE

## 2015-04-12 MED ORDER — SODIUM CHLORIDE 0.9 % IV SOLN
INTRAVENOUS | Status: AC
  Administered 2015-04-12 (×2): via INTRAVENOUS

## 2015-04-12 MED ORDER — PRAVASTATIN SODIUM 10 MG PO TABS
10.0000 mg | ORAL_TABLET | Freq: Every day | ORAL | Status: DC
  Administered 2015-04-12: 10 mg via ORAL
  Filled 2015-04-12: qty 1

## 2015-04-12 MED ORDER — BUDESONIDE 0.25 MG/2ML IN SUSP
0.2500 mg | Freq: Two times a day (BID) | RESPIRATORY_TRACT | Status: DC
  Administered 2015-04-12 – 2015-04-13 (×2): 0.25 mg via RESPIRATORY_TRACT
  Filled 2015-04-12 (×4): qty 2

## 2015-04-12 MED ORDER — ALBUTEROL SULFATE (2.5 MG/3ML) 0.083% IN NEBU
2.5000 mg | INHALATION_SOLUTION | RESPIRATORY_TRACT | Status: DC | PRN
  Administered 2015-04-12: 2.5 mg via RESPIRATORY_TRACT
  Filled 2015-04-12: qty 3

## 2015-04-12 MED ORDER — CLONIDINE HCL 0.1 MG PO TABS
0.1000 mg | ORAL_TABLET | Freq: Three times a day (TID) | ORAL | Status: DC
Start: 2015-04-12 — End: 2015-04-13
  Administered 2015-04-12 – 2015-04-13 (×5): 0.1 mg via ORAL
  Filled 2015-04-12 (×5): qty 1

## 2015-04-12 MED ORDER — PANTOPRAZOLE SODIUM 40 MG PO TBEC
40.0000 mg | DELAYED_RELEASE_TABLET | Freq: Every day | ORAL | Status: DC
  Administered 2015-04-12: 40 mg via ORAL
  Filled 2015-04-12: qty 1

## 2015-04-12 MED ORDER — GUAIFENESIN-DM 100-10 MG/5ML PO SYRP
5.0000 mL | ORAL_SOLUTION | ORAL | Status: DC | PRN
  Administered 2015-04-12 – 2015-04-13 (×3): 5 mL via ORAL
  Filled 2015-04-12 (×3): qty 5

## 2015-04-12 MED ORDER — IPRATROPIUM-ALBUTEROL 0.5-2.5 (3) MG/3ML IN SOLN
3.0000 mL | Freq: Four times a day (QID) | RESPIRATORY_TRACT | Status: DC | PRN
  Administered 2015-04-12 (×2): 3 mL via RESPIRATORY_TRACT
  Filled 2015-04-12 (×2): qty 3

## 2015-04-12 MED ORDER — HYDRALAZINE HCL 20 MG/ML IJ SOLN
10.0000 mg | Freq: Four times a day (QID) | INTRAMUSCULAR | Status: DC | PRN
  Administered 2015-04-12: 10 mg via INTRAVENOUS
  Filled 2015-04-12: qty 1

## 2015-04-12 MED ORDER — DEXTROSE 5 % IV SOLN
500.0000 mg | INTRAVENOUS | Status: DC
  Administered 2015-04-12: 500 mg via INTRAVENOUS
  Filled 2015-04-12 (×2): qty 500

## 2015-04-12 MED ORDER — ENOXAPARIN SODIUM 40 MG/0.4ML ~~LOC~~ SOLN
40.0000 mg | Freq: Every day | SUBCUTANEOUS | Status: DC
  Administered 2015-04-12 – 2015-04-13 (×2): 40 mg via SUBCUTANEOUS
  Filled 2015-04-12 (×2): qty 0.4

## 2015-04-12 MED ORDER — SODIUM CHLORIDE 0.9 % IJ SOLN
3.0000 mL | Freq: Two times a day (BID) | INTRAMUSCULAR | Status: DC
Start: 2015-04-12 — End: 2015-04-13
  Administered 2015-04-12 – 2015-04-13 (×2): 3 mL via INTRAVENOUS

## 2015-04-12 MED ORDER — ONDANSETRON HCL 4 MG/2ML IJ SOLN: 4.0000 mg | Freq: Three times a day (TID) | INTRAMUSCULAR | Status: DC | PRN

## 2015-04-12 MED ORDER — PANTOPRAZOLE SODIUM 40 MG PO TBEC
40.0000 mg | DELAYED_RELEASE_TABLET | Freq: Two times a day (BID) | ORAL | Status: DC
  Administered 2015-04-13: 40 mg via ORAL
  Filled 2015-04-12: qty 1

## 2015-04-12 MED ORDER — CEFTRIAXONE SODIUM IN DEXTROSE 20 MG/ML IV SOLN
1.0000 g | INTRAVENOUS | Status: DC
  Administered 2015-04-12: 1 g via INTRAVENOUS
  Filled 2015-04-12 (×2): qty 50

## 2015-04-12 MED ORDER — ACETAMINOPHEN 325 MG PO TABS
650.0000 mg | ORAL_TABLET | Freq: Four times a day (QID) | ORAL | Status: DC | PRN
  Administered 2015-04-12 (×2): 650 mg via ORAL
  Filled 2015-04-12: qty 2

## 2015-04-12 MED ORDER — ONDANSETRON HCL 4 MG/2ML IJ SOLN
4.0000 mg | Freq: Four times a day (QID) | INTRAMUSCULAR | Status: DC | PRN
  Administered 2015-04-12 – 2015-04-13 (×2): 4 mg via INTRAVENOUS
  Filled 2015-04-12 (×2): qty 2

## 2015-04-12 MED ORDER — SUCRALFATE 1 G PO TABS
1.0000 g | ORAL_TABLET | Freq: Two times a day (BID) | ORAL | Status: DC
  Administered 2015-04-12 – 2015-04-13 (×3): 1 g via ORAL
  Filled 2015-04-12 (×3): qty 1

## 2015-04-12 MED ORDER — SENNOSIDES-DOCUSATE SODIUM 8.6-50 MG PO TABS
1.0000 | ORAL_TABLET | Freq: Two times a day (BID) | ORAL | Status: DC
  Administered 2015-04-12 – 2015-04-13 (×3): 1 via ORAL
  Filled 2015-04-12 (×3): qty 1

## 2015-04-12 MED ORDER — PREDNISONE 20 MG PO TABS
40.0000 mg | ORAL_TABLET | Freq: Every day | ORAL | Status: DC
  Administered 2015-04-13: 40 mg via ORAL
  Filled 2015-04-12: qty 2

## 2015-04-12 MED ORDER — INSULIN ASPART 100 UNIT/ML ~~LOC~~ SOLN
0.0000 [IU] | Freq: Three times a day (TID) | SUBCUTANEOUS | Status: DC
  Administered 2015-04-12: 2 [IU] via SUBCUTANEOUS
  Administered 2015-04-12 (×2): 1 [IU] via SUBCUTANEOUS

## 2015-04-12 MED ORDER — PREDNISONE 20 MG PO TABS
20.0000 mg | ORAL_TABLET | Freq: Two times a day (BID) | ORAL | Status: DC
Start: 2015-04-12 — End: 2015-04-12
  Administered 2015-04-12: 20 mg via ORAL
  Filled 2015-04-12 (×2): qty 1

## 2015-04-12 MED ORDER — SODIUM CHLORIDE 0.9 % IV SOLN: 250.0000 mL | INTRAVENOUS | Status: DC | PRN

## 2015-04-12 MED ORDER — SODIUM CHLORIDE 0.9 % IJ SOLN: 3.0000 mL | INTRAMUSCULAR | Status: DC | PRN

## 2015-04-12 MED ORDER — FAMOTIDINE 10 MG PO TABS: 10.0000 mg | ORAL_TABLET | Freq: Two times a day (BID) | ORAL | Status: DC

## 2015-04-12 NOTE — ED Notes (Signed)
Attempted to call report

## 2015-04-12 NOTE — Progress Notes (Signed)
[facility] PROGRESS NOTE  Richard Hopkins IPJ:[id number] DOB: [birth year] DOA: 04/11/2015 PCP: Cathlean Cower, MD   brief narrative 69 year old male with history of COPD, active smoker, type 2 diabetes mellitus, history of pulmonary fibrosis, hyperlipidemia, essential hypertension,  esophageal stricture and GERD presented with three-day history of productive cough and increased shortness of breath without improvement on home nebulizer. Patient found to have right lung base pneumonia and COPD exacerbation and admitted to hospitalist service.  Assessment/Plan: Community acquired pneumonia On empiric Rocephin and azithromycin. Follow culture, sputum culture, urine for strep and Legionella antigen. Supportive care with Tylenol and antitussives.  Acute COPD exacerbation Placed on oral prednisone and DuoNeb. Add when necessary albuterol neb. Ounces strongly on smoking cessation. Continue Pulmicort nebulizer.  History of Esophageal stricture and GERD Continue Carafate. Added Protonix.  Diabetes mellitus type 2 On metformin at home. Place on sliding seen insulin  Essential hypertension Continue clonidine  Tobacco abuse Counseled on cessation. Ordered nicotine patch  DVT prophylaxis: Subcutaneous Lovenox  Diet: Diabetic  Code Status: Full code Family Communication: None at bedside  Disposition Plan: Home once improved possibly in the next 24-48 hours   Consultants:  None  Procedures:  None  Antibiotics:  Rocephin and azithromycin  HPI/Subjective: Patient seen and examined. Reports his breathing to be better. Complains of constipation and acid reflux.  Objective: Filed Vitals:   04/12/15 1439  BP: 180/97  Pulse: 93  Temp: 97.8 F (36.6 C)  Resp: 20    Intake/Output Summary (Last 24 hours) at 04/12/15 1454 Last data filed at 04/12/15 0900  Gross per 24 hour  Intake    969 ml  Output    250 ml  Net    719 ml   Filed Weights   04/12/15 0050  Weight:  90.765 kg (200 lb 1.6 oz)    Exam:   General:  Elderly male in no acute distress  HEENT: No pallor, moist oral mucosa, supple neck  Chest: Scattered rhonchi bilaterally,  CVS: Normal S1 and S2, no murmurs rub or gallop  GI: Soft, nondistended, nontender, bowel sounds present  Musculoskeletal: Warm, no edema  CNS: Alert and oriented    Data Reviewed: Basic Metabolic Panel:  Recent Labs Lab 04/11/15 1926 04/12/15 0645  NA 138 137  K 3.7 4.8  CL 104 104  CO2 24 22  GLUCOSE 130* 217*  BUN 13 15  CREATININE 1.23 1.16  CALCIUM 9.3 9.0   Liver Function Tests:  Recent Labs Lab 04/11/15 1926  AST 22  ALT 13  ALKPHOS 71  BILITOT 0.7  PROT 7.2  ALBUMIN 3.6   No results for input(s): LIPASE, AMYLASE in the last 168 hours. No results for input(s): AMMONIA in the last 168 hours. CBC:  Recent Labs Lab 04/11/15 1926 04/12/15 0645  WBC 11.0* 6.4  NEUTROABS 5.5 5.2  HGB 15.3 14.5  HCT 47.4 45.8  MCV 81.2 82.4  PLT 347 299   Cardiac Enzymes: No results for input(s): CKTOTAL, CKMB, CKMBINDEX, TROPONINI in the last 168 hours. BNP (last 3 results)  Recent Labs  04/11/15 1927  BNP 61.4    ProBNP (last 3 results)  Recent Labs  08/10/14 2000  PROBNP 378.8*    CBG:  Recent Labs Lab 04/12/15 0803 04/12/15 1155  GLUCAP 187* 122*    No results found for this or any previous visit (from the past 240 hour(s)).   Studies: Dg Chest 2 View  04/11/2015   CLINICAL DATA:  69 year old male with a history  of shortness of breath and cough. History of COPD.  EXAM: CHEST - 2 VIEW  COMPARISON:  08/10/2014, CT 11/16/2013, plain film 05/03/2014  FINDINGS: Cardiomediastinal silhouette unchanged in size and contour. Atherosclerotic calcifications of the aortic arch.  Re- demonstration of diffuse interstitial opacities. Stigmata of emphysema, with increased retrosternal airspace, flattened hemidiaphragms, increased AP diameter, and hyperinflation on the AP view.   Persistent thickening of the minor fissure. Nodularity at the periphery of the right lung/minor fissure.  Increased conspicuity of mixed interstitial and airspace opacity of the right lower lung.  No pneumothorax.  No displaced fracture.  Unremarkable appearance of the upper abdomen.  IMPRESSION: Increase conspicuity of airspace and interstitial disease at the right lung base concerning for pneumonia given the history.  Changes of emphysema, with a background of chronic lung changes/fibrosis.  Atherosclerosis.  Signed,  Dulcy Fanny. Earleen Newport, DO  [facility]  [facility]   Electronically Signed   By: Corrie Mckusick D.O.   On: 04/11/2015 21:54    Scheduled Meds: . [COMPLETED] sodium chloride   Intravenous STAT  . azithromycin  500 mg Intravenous Q24H  . cefTRIAXone (ROCEPHIN)  IV  1 g Intravenous Q24H  . cloNIDine  0.1 mg Oral 3 times per day  . enoxaparin (LOVENOX) injection  40 mg Subcutaneous Daily  . insulin aspart  0-9 Units Subcutaneous TID WC  . predniSONE  20 mg Oral BID WC  . sodium chloride  3 mL Intravenous Q12H  . sucralfate  1 g Oral BID WC   Continuous Infusions:     Time spent: 25 minutes    Shahid Flori, [place]  [facility] Pager [phone]. If 7PM-7AM, please contact night-coverage at [email or web address], password [email or web address] 04/12/2015, 2:54 PM  LOS: 1 day

## 2015-04-12 NOTE — Progress Notes (Signed)
PHARMACY CONSULT NOTE -  [facility] may adjust antibiotic dose based on patient's renal function. pharmacy csult - antibiotic renal do  - i Indication: CAP   Allergies  Allergen Reactions  . Doxycycline Nausea Only and Other (See Comments)    Loss of memory; "bad feeling," dizziness  . Ace Inhibitors Cough  . Cardura [Doxazosin Mesylate] Other (See Comments)    dizziness  . Cozaar [Losartan Potassium] Other (See Comments)    dizziness  . Dexilant [Dexlansoprazole] Itching and Rash  . Hydrocodone Nausea Only and Other (See Comments)    dizziness  . Lisinopril Cough  . Norvasc [Amlodipine Besylate] Other (See Comments)    3 times dizzy, bad taste in mouth, none since stopped  . Alfuzosin Rash    Patient Measurements: Height: 6\' 3"  (190.5 cm) Weight: 200 lb 1.6 oz (90.765 kg) IBW/kg (Calculated) : 84.5   Vital Signs: Temp: 98.3 F (36.8 C) (04/21 0050) Temp Source: Oral (04/21 0050) BP: 165/91 mmHg (04/21 0235) Pulse Rate: 87 (04/21 0235) Intake/Output from previous day: 04/20 0701 - 04/21 0700 In: 50 [I.V.:50] Out: -  Intake/Output from this shift: Total I/O In: 50 [I.V.:50] Out: -   Labs:  Recent Labs  04/11/15 1926  WBC 11.0*  HGB 15.3  PLT 347  CREATININE 1.23   Estimated Creatinine Clearance: 65.8 mL/min (by C-G formula based on Cr of 1.23). No results for input(s): VANCOTROUGH, VANCOPEAK, VANCORANDOM, GENTTROUGH, GENTPEAK, GENTRANDOM, TOBRATROUGH, TOBRAPEAK, TOBRARND, AMIKACINPEAK, AMIKACINTROU, AMIKACIN in the last 72 hours.   Microbiology: No results found for this or any previous visit (from the past 720 hour(s)).  Medical History: Past Medical History  Diagnosis Date  . THRUSH   . ADENOMATOUS COLONIC POLYP   . DIABETES MELLITUS, TYPE II   . HYPERLIPIDEMIA   . SMOKER   . BLURRED VISION   . HYPERTENSION   . ALLERGIC RHINITIS   . CHRONIC OBSTRUCTIVE PULMONARY DISEASE, ACUTE EXACERBATION   . COPD   . PULMONARY FIBROSIS   .  ESOPHAGEAL STRICTURE   . GERD   . DIVERTICULAR DISEASE   . BENIGN PROSTATIC HYPERTROPHY   . Acute prostatitis   . EPIDIDYMO-ORCHITIS   . SKIN LESION   . LUMBAR DISC DISORDER   . Pain in soft tissues of limb   . MUSCLE CRAMPS   . FATIGUE   . MASS, SUPERFICIAL   . DYSPNEA   . Wheezing   . DYSPNEA/SHORTNESS OF BREATH   . Nocturia   . PSA, INCREASED   . ABNORMAL ELECTROCARDIOGRAM   . ERECTILE DYSFUNCTION, ORGANIC, HX OF   . SYNCOPE   . Brain tumor   . COPD (chronic obstructive pulmonary disease)   . Chronic steroid use     Medications:  Scheduled:  . sodium chloride   Intravenous STAT  . azithromycin  500 mg Intravenous Q24H  . cefTRIAXone (ROCEPHIN)  IV  1 g Intravenous Q24H  . cloNIDine  0.1 mg Oral 3 times per day  . enoxaparin (LOVENOX) injection  40 mg Subcutaneous Daily  . insulin aspart  0-9 Units Subcutaneous TID WC  . predniSONE  20 mg Oral BID WC  . sodium chloride  3 mL Intravenous Q12H  . sucralfate  1 g Oral BID WC   Assessment: 69 y.o male with CAP started on azithromycin 500mg  IV q24h and Ceftriaxone 1gm IV q24h x 7 days SCr 1.23, estimated CrCl ~ 65.8 ml/min.  Antibiotic doses are appropriate for this patient's renal function to treat CAP.  Plan:  No change in azithromycin or ceftriaxone dosing needed.  Nicole Cella, RPh Clinical Pharmacist Pager: [phone] 04/12/2015,3:31 AM

## 2015-04-12 NOTE — H&P (Signed)
PCP:   Cathlean Cower, MD   Chief Complaint:  Sob and wheezing worse for a week  HPI: 69 yo male h/o copd not on supplemental oxygen at home, dm, htn comes in with one week of worsening sob at rest, diffuse wheezing and uri symptoms.  Denies any cp. No le edema or swelling.  No fevers at home, no chills.  On arrival to ED hypoxic with sats low 90s on RA and diffusely wheezing and tachypneic.  Much improved with prednisone, several nebs, oxygen 2 liters [place] and abx.  Referred for admission for pna and copde.  No recent abx.  Review of Systems:  Positive and negative as per HPI otherwise all other systems are negative  Past Medical History: Past Medical History  Diagnosis Date  . THRUSH   . ADENOMATOUS COLONIC POLYP   . DIABETES MELLITUS, TYPE II   . HYPERLIPIDEMIA   . SMOKER   . BLURRED VISION   . HYPERTENSION   . ALLERGIC RHINITIS   . CHRONIC OBSTRUCTIVE PULMONARY DISEASE, ACUTE EXACERBATION   . COPD   . PULMONARY FIBROSIS   . ESOPHAGEAL STRICTURE   . GERD   . DIVERTICULAR DISEASE   . BENIGN PROSTATIC HYPERTROPHY   . Acute prostatitis   . EPIDIDYMO-ORCHITIS   . SKIN LESION   . LUMBAR DISC DISORDER   . Pain in soft tissues of limb   . MUSCLE CRAMPS   . FATIGUE   . MASS, SUPERFICIAL   . DYSPNEA   . Wheezing   . DYSPNEA/SHORTNESS OF BREATH   . Nocturia   . PSA, INCREASED   . ABNORMAL ELECTROCARDIOGRAM   . ERECTILE DYSFUNCTION, ORGANIC, HX OF   . SYNCOPE   . Brain tumor   . COPD (chronic obstructive pulmonary disease)   . Chronic steroid use    Past Surgical History  Procedure Laterality Date  . Back surgery      x 4 disabled after 2003  . Elbow arthroscopy      Medications: Prior to Admission medications   Medication Sig Start Date End Date Taking? Authorizing Provider  budesonide (PULMICORT) 0.25 MG/2ML nebulizer solution Take 2 mLs (0.25 mg total) by nebulization 2 (two) times daily. DX: J44.9 03/08/15  Yes Tammy S Parrett, NP  budesonide-formoterol (SYMBICORT)  160-4.5 MCG/ACT inhaler Inhale 2 puffs into the lungs every 12 (twelve) hours as needed.   Yes Historical Provider, MD  cloNIDine (CATAPRES) 0.1 MG tablet Take 1 tablet (0.1 mg total) by mouth 3 (three) times daily. 08/08/14  Yes Biagio Borg, MD  dexlansoprazole (DEXILANT) 60 MG capsule Take 1 capsule (60 mg total) by mouth daily. 03/13/15  Yes Lafayette Dragon, MD  formoterol (PERFOROMIST) 20 MCG/2ML nebulizer solution Take 2 mLs (20 mcg total) by nebulization 2 (two) times daily. DX: J44.9 03/08/15  Yes Tammy S Parrett, NP  ipratropium-albuterol (DUONEB) 0.5-2.5 (3) MG/3ML SOLN Take 3 mLs by nebulization every 6 (six) hours as needed (wheezing/shortness of breath). 11/09/14  Yes Biagio Borg, MD  lovastatin (MEVACOR) 20 MG tablet Take 40 mg by mouth daily at 6 PM.   Yes Historical Provider, MD  metFORMIN (GLUCOPHAGE) 500 MG tablet Take 1,000 mg by mouth 2 (two) times daily with a meal.   Yes Historical Provider, MD  omeprazole (PRILOSEC) 40 MG capsule Take 1 capsule by mouth every afternoon. 03/08/15  Yes Lafayette Dragon, MD  sucralfate (CARAFATE) 1 G tablet Take 1 tablet (1 g total) by mouth 2 (two) times daily. 03/13/15  Yes Lafayette Dragon, MD  triamcinolone (NASACORT AQ) 55 MCG/ACT AERO nasal inhaler Place 2 sprays into the nose daily. 04/03/15  Yes Biagio Borg, MD  cetirizine (ZYRTEC) 10 MG tablet Take 1 tablet (10 mg total) by mouth daily. Patient not taking: Reported on 04/11/2015 04/03/15   Biagio Borg, MD  tamsulosin [facility]) 0.4 MG CAPS capsule Take 1 capsule (0.4 mg total) by mouth daily. Patient not taking: Reported on 04/11/2015 02/14/15   Biagio Borg, MD    Allergies:   Allergies  Allergen Reactions  . Doxycycline Nausea Only and Other (See Comments)    Loss of memory; "bad feeling," dizziness  . Ace Inhibitors Cough  . Cardura [Doxazosin Mesylate] Other (See Comments)    dizziness  . Cozaar [Losartan Potassium] Other (See Comments)    dizziness  . Dexilant [Dexlansoprazole] Itching and  Rash  . Hydrocodone Nausea Only and Other (See Comments)    dizziness  . Lisinopril Cough  . Norvasc [Amlodipine Besylate] Other (See Comments)    3 times dizzy, bad taste in mouth, none since stopped  . Alfuzosin Rash    Social History:  reports that he has been smoking Cigarettes.  He has a 50 pack-year smoking history. He has never used smokeless tobacco. He reports that he does not drink alcohol or use illicit drugs.  Family History: Family History  Problem Relation Age of Onset  . Diabetes Mother   . Colon cancer Neg Hx   . Asthma Mother   . Emphysema Mother     smoker    Physical Exam: Filed Vitals:   04/11/15 2315 04/11/15 2330 04/11/15 2345 04/12/15 0000  BP: 166/94 133/65 161/94 159/88  Pulse: 92 95 96 90  Temp:      TempSrc:      Resp:      SpO2: 94% 95% 91% 96%   General appearance: alert, cooperative and mild distress Head: Normocephalic, without obvious abnormality, atraumatic Eyes: negative Nose: Nares normal. Septum midline. Mucosa normal. No drainage or sinus tenderness. Neck: no JVD and supple, symmetrical, trachea midline Lungs: wheezes bilaterally Heart: regular rate and rhythm, S1, S2 normal, no murmur, click, rub or gallop Abdomen: soft, non-tender; bowel sounds normal; no masses,  no organomegaly Extremities: extremities normal, atraumatic, no cyanosis or edema Pulses: 2+ and symmetric Skin: Skin color, texture, turgor normal. No rashes or lesions Neurologic: Grossly normal   Labs on Admission:   Recent Labs  04/11/15 1926  NA 138  K 3.7  CL 104  CO2 24  GLUCOSE 130*  BUN 13  CREATININE 1.23  CALCIUM 9.3    Recent Labs  04/11/15 1926  AST 22  ALT 13  ALKPHOS 71  BILITOT 0.7  PROT 7.2  ALBUMIN 3.6    Recent Labs  04/11/15 1926  WBC 11.0*  NEUTROABS 5.5  HGB 15.3  HCT 47.4  MCV 81.2  PLT 347    Recent Labs  04/11/15 2148  TSH 1.195   Radiological Exams on Admission: Dg Chest 2 View  04/11/2015   CLINICAL  DATA:  69 year old male with a history of shortness of breath and cough. History of COPD.  EXAM: CHEST - 2 VIEW  COMPARISON:  08/10/2014, CT 11/16/2013, plain film 05/03/2014  FINDINGS: Cardiomediastinal silhouette unchanged in size and contour. Atherosclerotic calcifications of the aortic arch.  Re- demonstration of diffuse interstitial opacities. Stigmata of emphysema, with increased retrosternal airspace, flattened hemidiaphragms, increased AP diameter, and hyperinflation on the AP view.  Persistent thickening  of the minor fissure. Nodularity at the periphery of the right lung/minor fissure.  Increased conspicuity of mixed interstitial and airspace opacity of the right lower lung.  No pneumothorax.  No displaced fracture.  Unremarkable appearance of the upper abdomen.  IMPRESSION: Increase conspicuity of airspace and interstitial disease at the right lung base concerning for pneumonia given the history.  Changes of emphysema, with a background of chronic lung changes/fibrosis.  Atherosclerosis.  Signed,  Dulcy Fanny. Earleen Newport, DO  [facility]  [facility]   Electronically Signed   By: Corrie Mckusick D.O.   On: 04/11/2015 21:54    Assessment/Plan  69 yo male with acute hypoxic respiratory failure secondary to CAP and copde  Principal Problem:   CAP (community acquired pneumonia)-  pna pathway, iv rocephin and azithromycin.  Oxygen supplementation.  Blood and sputum cultures ordered.  Active Problems:   Diabetes-  ssi   Essential hypertension- stable   COPD GOLD III/IV    COPD exacerbation-  Place on oral steroids, freq nebs.  Treat pna.   Acute respiratory failure with hypoxia- as above.  Admit to medical bed.  Full code.  Expected LOS 2 days.  Laderius Valbuena A 04/12/2015, 12:08 AM

## 2015-04-12 NOTE — ED Notes (Signed)
Dr. Shanon Brow, hospitalist, at the bedside.

## 2015-04-13 ENCOUNTER — Telehealth: Payer: Self-pay | Admitting: *Deleted

## 2015-04-13 DIAGNOSIS — E119 Type 2 diabetes mellitus without complications: Secondary | ICD-10-CM

## 2015-04-13 DIAGNOSIS — Z720 Tobacco use: Secondary | ICD-10-CM

## 2015-04-13 DIAGNOSIS — K219 Gastro-esophageal reflux disease without esophagitis: Secondary | ICD-10-CM | POA: Diagnosis present

## 2015-04-13 LAB — LEGIONELLA ANTIGEN, URINE

## 2015-04-13 LAB — GLUCOSE, CAPILLARY: Glucose-Capillary: 97 mg/dL (ref 70–99)

## 2015-04-13 MED ORDER — LEVOFLOXACIN 750 MG PO TABS: 750.0000 mg | ORAL_TABLET | Freq: Every day | ORAL | Status: AC

## 2015-04-13 MED ORDER — GUAIFENESIN-DM 100-10 MG/5ML PO SYRP: 5.0000 mL | ORAL_SOLUTION | ORAL | Status: DC | PRN

## 2015-04-13 MED ORDER — HYDRALAZINE HCL 25 MG PO TABS: 25.0000 mg | ORAL_TABLET | Freq: Three times a day (TID) | ORAL | Status: DC

## 2015-04-13 MED ORDER — PREDNISONE 20 MG PO TABS: 40.0000 mg | ORAL_TABLET | Freq: Every day | ORAL | Status: AC

## 2015-04-13 MED ORDER — NICOTINE 14 MG/24HR TD PT24: 14.0000 mg | MEDICATED_PATCH | Freq: Every day | TRANSDERMAL | Status: DC

## 2015-04-13 NOTE — Progress Notes (Signed)
Patient discharged to home with instructions. 

## 2015-04-13 NOTE — Discharge Instructions (Signed)

## 2015-04-13 NOTE — Discharge Summary (Signed)
Physician Discharge Summary  Richard Hopkins DVV:[id number] DOB: 19-Jul-1946 DOA: 04/11/2015  PCP: Cathlean Cower, MD  Admit date: 04/11/2015 Discharge date: 04/13/2015  Time spent: 35 minutes   Recommendations for Outpatient Follow-up:  1. Discharge home with outpatient PCP follow-up in 1 week 2. Patient will complete antibiotics on 4/25.  Discharge Diagnoses:  Principal Problem:   CAP (community acquired pneumonia)  Active Problems:   Essential hypertension   COPD GOLD III/IV    COPD exacerbation   Acute respiratory failure with hypoxia   Type 2 diabetes mellitus   GERD (gastroesophageal reflux disease)   Tobacco abuse   Discharge Condition: Fair  Diet recommendation: Low sodium  Filed Weights   04/12/15 0050  Weight: 90.765 kg (200 lb 1.6 oz)    History of present illness:  Please refer to admission H&P for details, in brief, 69 year old male with history of COPD, active smoker, type 2 diabetes mellitus, history of pulmonary fibrosis, hyperlipidemia, essential hypertension, esophageal stricture and GERD presented with three-day history of productive cough and increased shortness of breath without improvement on home nebulizer. Patient found to have right lung base pneumonia and COPD exacerbation and admitted to hospitalist service.  Hospital Course:  Community acquired pneumonia Placed on empiric Rocephin and azithromycin. Cultures have been negative. Patient remains afebrile and symptoms improved. We'll prescribe 3 more days of oral Levaquin and antitussives.  Acute COPD exacerbation Placed on oral prednisone and DuoNeb. Symptoms improved. Will prescribe 5 more days of oral prednisone. Resume home inhalers and nebulizer. -counseled strongly on smoking cessation. He wishes to quit and agrees with taking nicotine patch which is prescribed.   Uncontrolled hypertension Continue clonidine. I will prescribe him hydralazine 25 mg 2 times a day. Counseled on decrease salt  intake in diet and exercise to lose weight. Also is on medication adherence.  History of Esophageal stricture and GERD Continue Carafate. Patient on dexilant and omeprazole at home which should be continued.   Diabetes mellitus type 2 Continue metformin    Tobacco abuse Counseled on cessation. Nicotine patch prescribed.  BPH Patient reports not taking Flomax as he cannot afford it. Denies any symptoms at this time.    Diet: Diabetic  Code Status: Full code Family Communication: None at bedside  Disposition Plan: Home with outpatient PCP follow-up in 1-2 weeks.   Consultants:  None  Procedures:  None  Antibiotics:  Rocephin and azithromycin 4/21-4/22  Levaquin from 4/23-4/25  Discharge Exam: Filed Vitals:   04/13/15 0553  BP: 161/80  Pulse: 97  Temp: 98.8 F (37.1 C)  Resp: 20    General: Elderly obese male in no acute distress HEENT: No pallor, moist oral mucosa, supple neck Chest: Clear to rotation bilaterally, no added sounds CVS: Normal S1 and S2, no murmurs rub or gallop GI: Soft, nondistended, nontender, bowel sounds present Musculoskeletal: Warm, no edema CNS: Alert and oriented  Discharge Instructions    Current Discharge Medication List    START taking these medications   Details  guaiFENesin-dextromethorphan (ROBITUSSIN DM) 100-10 MG/5ML syrup Take 5 mLs by mouth every 4 (four) hours as needed for cough. Qty: 118 mL, Refills: 0    hydrALAZINE (APRESOLINE) 25 MG tablet Take 1 tablet (25 mg total) by mouth 3 (three) times daily. Qty: 90 tablet, Refills: 0    levofloxacin (LEVAQUIN) 750 MG tablet Take 1 tablet (750 mg total) by mouth daily. Qty: 3 tablet, Refills: 0    nicotine (NICODERM CQ) 14 mg/24hr patch Place 1 patch (14 mg total)  onto the skin daily. Qty: 28 patch, Refills: 0    predniSONE (DELTASONE) 20 MG tablet Take 2 tablets (40 mg total) by mouth daily with breakfast. Qty: 5 tablet, Refills: 0      CONTINUE these  medications which have NOT CHANGED   Details  budesonide (PULMICORT) 0.25 MG/2ML nebulizer solution Take 2 mLs (0.25 mg total) by nebulization 2 (two) times daily. DX: J44.9 Qty: 120 mL, Refills: 11   Associated Diagnoses: COPD mixed type    budesonide-formoterol (SYMBICORT) 160-4.5 MCG/ACT inhaler Inhale 2 puffs into the lungs every 12 (twelve) hours as needed.    cloNIDine (CATAPRES) 0.1 MG tablet Take 1 tablet (0.1 mg total) by mouth 3 (three) times daily. Qty: 270 tablet, Refills: 3    dexlansoprazole (DEXILANT) 60 MG capsule Take 1 capsule (60 mg total) by mouth daily. Qty: 60 capsule, Refills: 4    formoterol (PERFOROMIST) 20 MCG/2ML nebulizer solution Take 2 mLs (20 mcg total) by nebulization 2 (two) times daily. DX: J44.9 Qty: 120 mL, Refills: 11   Associated Diagnoses: COPD mixed type    ipratropium-albuterol (DUONEB) 0.5-2.5 (3) MG/3ML SOLN Take 3 mLs by nebulization every 6 (six) hours as needed (wheezing/shortness of breath). Qty: 60 mL, Refills: 11    lovastatin (MEVACOR) 20 MG tablet Take 40 mg by mouth daily at 6 PM.    metFORMIN (GLUCOPHAGE) 500 MG tablet Take 1,000 mg by mouth 2 (two) times daily with a meal.    omeprazole (PRILOSEC) 40 MG capsule Take 1 capsule by mouth every afternoon. Qty: 30 capsule, Refills: 6    sucralfate (CARAFATE) 1 G tablet Take 1 tablet (1 g total) by mouth 2 (two) times daily. Qty: 60 tablet, Refills: 3    triamcinolone (NASACORT AQ) 55 MCG/ACT AERO nasal inhaler Place 2 sprays into the nose daily. Qty: 1 Inhaler, Refills: 12      STOP taking these medications     cetirizine (ZYRTEC) 10 MG tablet      tamsulosin (FLOMAX) 0.4 MG CAPS capsule        Allergies  Allergen Reactions  . Doxycycline Nausea Only and Other (See Comments)    Loss of memory; "bad feeling," dizziness  . Ace Inhibitors Cough  . Cardura [Doxazosin Mesylate] Other (See Comments)    dizziness  . Cozaar [Losartan Potassium] Other (See Comments)     dizziness  . Dexilant [Dexlansoprazole] Itching and Rash  . Hydrocodone Nausea Only and Other (See Comments)    dizziness  . Lisinopril Cough  . Norvasc [Amlodipine Besylate] Other (See Comments)    3 times dizzy, bad taste in mouth, none since stopped  . Alfuzosin Rash      The results of significant diagnostics from this hospitalization (including imaging, microbiology, ancillary and laboratory) are listed below for reference.    Significant Diagnostic Studies: Dg Chest 2 View  04/11/2015   CLINICAL DATA:  69 year old male with a history of shortness of breath and cough. History of COPD.  EXAM: CHEST - 2 VIEW  COMPARISON:  08/10/2014, CT 11/16/2013, plain film 05/03/2014  FINDINGS: Cardiomediastinal silhouette unchanged in size and contour. Atherosclerotic calcifications of the aortic arch.  Re- demonstration of diffuse interstitial opacities. Stigmata of emphysema, with increased retrosternal airspace, flattened hemidiaphragms, increased AP diameter, and hyperinflation on the AP view.  Persistent thickening of the minor fissure. Nodularity at the periphery of the right lung/minor fissure.  Increased conspicuity of mixed interstitial and airspace opacity of the right lower lung.  No pneumothorax.  No displaced  fracture.  Unremarkable appearance of the upper abdomen.  IMPRESSION: Increase conspicuity of airspace and interstitial disease at the right lung base concerning for pneumonia given the history.  Changes of emphysema, with a background of chronic lung changes/fibrosis.  Atherosclerosis.  Signed,  Dulcy Fanny. Earleen Newport, DO  [facility]  [facility]   Electronically Signed   By: Corrie Mckusick D.O.   On: 04/11/2015 21:54    Microbiology: Recent Results (from the past 240 hour(s))  Culture, blood (routine x 2) Call MD if unable to obtain prior to antibiotics being given     Status: None (Preliminary result)   Collection Time: 04/12/15  6:45 AM   Result Value Ref Range Status   Specimen Description BLOOD RIGHT ARM  Final   Special Requests BOTTLES DRAWN AEROBIC AND ANAEROBIC 10CCS  Final   Culture   Final           BLOOD CULTURE RECEIVED NO GROWTH TO DATE CULTURE WILL BE HELD FOR 5 DAYS BEFORE ISSUING A FINAL NEGATIVE REPORT Performed at [facility]    Report Status PENDING  Incomplete  Culture, blood (routine x 2) Call MD if unable to obtain prior to antibiotics being given     Status: None (Preliminary result)   Collection Time: 04/12/15  6:50 AM  Result Value Ref Range Status   Specimen Description BLOOD RIGHT HAND  Final   Special Requests BOTTLES DRAWN AEROBIC ONLY 6CCS  Final   Culture   Final           BLOOD CULTURE RECEIVED NO GROWTH TO DATE CULTURE WILL BE HELD FOR 5 DAYS BEFORE ISSUING A FINAL NEGATIVE REPORT Performed at [facility]    Report Status PENDING  Incomplete     Labs: Basic Metabolic Panel:  Recent Labs Lab 04/11/15 1926 04/12/15 0645  NA 138 137  K 3.7 4.8  CL 104 104  CO2 24 22  GLUCOSE 130* 217*  BUN 13 15  CREATININE 1.23 1.16  CALCIUM 9.3 9.0   Liver Function Tests:  Recent Labs Lab 04/11/15 1926  AST 22  ALT 13  ALKPHOS 71  BILITOT 0.7  PROT 7.2  ALBUMIN 3.6   No results for input(s): LIPASE, AMYLASE in the last 168 hours. No results for input(s): AMMONIA in the last 168 hours. CBC:  Recent Labs Lab 04/11/15 1926 04/12/15 0645  WBC 11.0* 6.4  NEUTROABS 5.5 5.2  HGB 15.3 14.5  HCT 47.4 45.8  MCV 81.2 82.4  PLT 347 299   Cardiac Enzymes: No results for input(s): CKTOTAL, CKMB, CKMBINDEX, TROPONINI in the last 168 hours. BNP: BNP (last 3 results)  Recent Labs  04/11/15 1927  BNP 61.4    ProBNP (last 3 results)  Recent Labs  08/10/14 2000  PROBNP 378.8*    CBG:  Recent Labs Lab 04/12/15 0803 04/12/15 1155 04/12/15 1713 04/13/15 0756  GLUCAP 187* 122* 132* 97       Signed:  Shakera Ebrahimi  [facility] 04/13/2015, 10:59 AM

## 2015-04-13 NOTE — Telephone Encounter (Signed)
Transition Care Management Follow-up Telephone Call D/c 04/13/15  How have you been since you were released from the hospital? Pt states he is feeling ok   Do you understand why you were in the hospital? YES   Do you understand the discharge instrcutions? YES  Items Reviewed:  Medications reviewed: YES  Allergies reviewed: YES  Dietary changes reviewed: YES, low sodium  Referrals reviewed: no referrals needed   Functional Questionnaire:   Activities of Daily Living (ADLs):   He states he are independent in the following: ambulation, bathing and hygiene, feeding, continence, grooming, toileting and dressing States he doesn't require assistance    Any transportation issues/concerns?: NO   Any patient concerns? NO   Confirmed importance and date/time of follow-up visits scheduled: YES, made appt for 04/19/15 w/ Dr. Jenny Reichmann   Confirmed with patient if condition begins to worsen call PCP or go to the ER.

## 2015-04-18 LAB — CULTURE, BLOOD (ROUTINE X 2)
CULTURE: NO GROWTH
Culture: NO GROWTH

## 2015-04-19 ENCOUNTER — Encounter: Payer: Self-pay | Admitting: Internal Medicine

## 2015-04-19 ENCOUNTER — Ambulatory Visit (INDEPENDENT_AMBULATORY_CARE_PROVIDER_SITE_OTHER): Payer: Medicare Other | Admitting: Internal Medicine

## 2015-04-19 VITALS — BP 136/88 | HR 98 | Temp 98.0°F | Resp 18 | Ht 75.0 in | Wt 199.1 lb

## 2015-04-19 DIAGNOSIS — J441 Chronic obstructive pulmonary disease with (acute) exacerbation: Secondary | ICD-10-CM

## 2015-04-19 DIAGNOSIS — J189 Pneumonia, unspecified organism: Secondary | ICD-10-CM | POA: Diagnosis not present

## 2015-04-19 DIAGNOSIS — J9601 Acute respiratory failure with hypoxia: Secondary | ICD-10-CM | POA: Diagnosis not present

## 2015-04-19 NOTE — Patient Instructions (Signed)
Please continue all other medications as before, and refills have been done if requested.  Please have the pharmacy call with any other refills you may need.  Please continue your efforts at being more active, low cholesterol diet, and weight control.  Please keep your appointments with your specialists as you may have planned     

## 2015-04-19 NOTE — Assessment & Plan Note (Signed)
Resolved,  to f/u any worsening symptoms or concerns  SpO2 Readings from Last 3 Encounters:  04/19/15 94%  04/03/15 93%  03/01/15 98%

## 2015-04-19 NOTE — Assessment & Plan Note (Signed)
Resolved, d/w pt, asking for repeat prednisone, but does not appear to need at this time,  to f/u any worsening symptoms or concerns

## 2015-04-19 NOTE — Progress Notes (Signed)
Subjective:    Patient ID: Richard Hopkins, male    DOB: Jun 25, 1946, 69 y.o.   MRN: [id number]  HPI  Here to f/u post hospn for RLL pna, copd exac and HTN; doing well, finished outpt tx, no further fever, ST, or cough, Pt denies chest pain, increased sob or doe, wheezing, orthopnea, PND, increased LE swelling, palpitations, dizziness or syncope.  C/o lack of stamina  Also not sure if dexilant is working well enough, plans to see Dr Dennie Fetters.  Felt dizzy with taking the hydralazine, so stopped after 3 pills Past Medical History  Diagnosis Date  . THRUSH   . ADENOMATOUS COLONIC POLYP   . DIABETES MELLITUS, TYPE II   . HYPERLIPIDEMIA   . SMOKER   . BLURRED VISION   . HYPERTENSION   . ALLERGIC RHINITIS   . CHRONIC OBSTRUCTIVE PULMONARY DISEASE, ACUTE EXACERBATION   . COPD   . PULMONARY FIBROSIS   . ESOPHAGEAL STRICTURE   . GERD   . DIVERTICULAR DISEASE   . BENIGN PROSTATIC HYPERTROPHY   . Acute prostatitis   . EPIDIDYMO-ORCHITIS   . SKIN LESION   . LUMBAR DISC DISORDER   . Pain in soft tissues of limb   . MUSCLE CRAMPS   . FATIGUE   . MASS, SUPERFICIAL   . DYSPNEA   . Wheezing   . DYSPNEA/SHORTNESS OF BREATH   . Nocturia   . PSA, INCREASED   . ABNORMAL ELECTROCARDIOGRAM   . ERECTILE DYSFUNCTION, ORGANIC, HX OF   . SYNCOPE   . Brain tumor   . COPD (chronic obstructive pulmonary disease)   . Chronic steroid use    Past Surgical History  Procedure Laterality Date  . Back surgery      x 4 disabled after 2003  . Elbow arthroscopy      reports that he has been smoking Cigarettes.  He has a 50 pack-year smoking history. He has never used smokeless tobacco. He reports that he does not drink alcohol or use illicit drugs. family history includes Asthma in his mother; Diabetes in his mother; Emphysema in his mother. There is no history of Colon cancer. Allergies  Allergen Reactions  . Doxycycline Nausea Only and Other (See Comments)    Loss of memory; "bad feeling,"  dizziness  . Ace Inhibitors Cough  . Cardura [Doxazosin Mesylate] Other (See Comments)    dizziness  . Cozaar [Losartan Potassium] Other (See Comments)    dizziness  . Dexilant [Dexlansoprazole] Itching and Rash  . Hydrocodone Nausea Only and Other (See Comments)    dizziness  . Lisinopril Cough  . Norvasc [Amlodipine Besylate] Other (See Comments)    3 times dizzy, bad taste in mouth, none since stopped  . Alfuzosin Rash   Current Outpatient Prescriptions on File Prior to Visit  Medication Sig Dispense Refill  . budesonide (PULMICORT) 0.25 MG/2ML nebulizer solution Take 2 mLs (0.25 mg total) by nebulization 2 (two) times daily. DX: J44.9 120 mL 11  . budesonide-formoterol (SYMBICORT) 160-4.5 MCG/ACT inhaler Inhale 2 puffs into the lungs every 12 (twelve) hours as needed.    . cloNIDine (CATAPRES) 0.1 MG tablet Take 1 tablet (0.1 mg total) by mouth 3 (three) times daily. 270 tablet 3  . dexlansoprazole (DEXILANT) 60 MG capsule Take 1 capsule (60 mg total) by mouth daily. 60 capsule 4  . formoterol (PERFOROMIST) 20 MCG/2ML nebulizer solution Take 2 mLs (20 mcg total) by nebulization 2 (two) times daily. DX: J44.9 120 mL 11  .  guaiFENesin-dextromethorphan (ROBITUSSIN DM) 100-10 MG/5ML syrup Take 5 mLs by mouth every 4 (four) hours as needed for cough. 118 mL 0  . hydrALAZINE (APRESOLINE) 25 MG tablet Take 1 tablet (25 mg total) by mouth 3 (three) times daily. 90 tablet 0  . ipratropium-albuterol (DUONEB) 0.5-2.5 (3) MG/3ML SOLN Take 3 mLs by nebulization every 6 (six) hours as needed (wheezing/shortness of breath). 60 mL 11  . lovastatin (MEVACOR) 20 MG tablet Take 40 mg by mouth daily at 6 PM.    . metFORMIN (GLUCOPHAGE) 500 MG tablet Take 1,000 mg by mouth 2 (two) times daily with a meal.    . nicotine (NICODERM CQ) 14 mg/24hr patch Place 1 patch (14 mg total) onto the skin daily. 28 patch 0  . omeprazole (PRILOSEC) 40 MG capsule Take 1 capsule by mouth every afternoon. 30 capsule 6  .  sucralfate (CARAFATE) 1 G tablet Take 1 tablet (1 g total) by mouth 2 (two) times daily. 60 tablet 3  . triamcinolone (NASACORT AQ) 55 MCG/ACT AERO nasal inhaler Place 2 sprays into the nose daily. 1 Inhaler 12   No current facility-administered medications on file prior to visit.    Review of Systems  Constitutional: Negative for unusual diaphoresis or night sweats HENT: Negative for ringing in ear or discharge Eyes: Negative for double vision or worsening visual disturbance.  Respiratory: Negative for choking and stridor.   Gastrointestinal: Negative for vomiting or other signifcant bowel change Genitourinary: Negative for hematuria or change in urine volume.  Musculoskeletal: Negative for other MSK pain or swelling Skin: Negative for color change and worsening wound.  Neurological: Negative for tremors and numbness other than noted  Psychiatric/Behavioral: Negative for decreased concentration or agitation other than above       Objective:   Physical Exam BP 136/88 mmHg  Pulse 98  Temp(Src) 98 F (36.7 C) (Oral)  Resp 18  Ht 6\' 3"  (1.905 m)  Wt 199 lb 1.9 oz (90.32 kg)  BMI 24.89 kg/m2  SpO2 94% BP 136/88 mmHg  Pulse 98  Temp(Src) 98 F (36.7 C) (Oral)  Resp 18  Ht 6\' 3"  (1.905 m)  Wt 199 lb 1.9 oz (90.32 kg)  BMI 24.89 kg/m2  SpO2 94% VS noted,  Constitutional: Pt appears in no significant distress HENT: Head: NCAT.  Right Ear: External ear normal.  Left Ear: External ear normal.  Eyes: . Pupils are equal, round, and reactive to light. Conjunctivae and EOM are normal Neck: Normal range of motion. Neck supple.  Cardiovascular: Normal rate and regular rhythm.   Pulmonary/Chest: Effort normal and breath sounds decreased without rales or wheezing.  Abd:  Soft, NT, ND, + BS Neurological: Pt is alert. Not confused , motor grossly intact Skin: Skin is warm. No rash, no LE edema Psychiatric: Pt behavior is normal. No agitation.        Assessment & Plan:

## 2015-04-19 NOTE — Assessment & Plan Note (Signed)
Resolved, d/w pt, no need further antibx at this time

## 2015-04-20 ENCOUNTER — Telehealth: Payer: Self-pay | Admitting: Internal Medicine

## 2015-04-20 NOTE — Addendum Note (Signed)
Addended by: Biagio Borg on: 04/20/2015 09:47 PM   Modules accepted: Level of Service

## 2015-05-10 ENCOUNTER — Ambulatory Visit: Payer: Medicare Other | Admitting: Internal Medicine

## 2015-05-11 ENCOUNTER — Encounter: Payer: Self-pay | Admitting: Internal Medicine

## 2015-05-11 ENCOUNTER — Ambulatory Visit (INDEPENDENT_AMBULATORY_CARE_PROVIDER_SITE_OTHER): Payer: Medicare Other | Admitting: Internal Medicine

## 2015-05-11 ENCOUNTER — Other Ambulatory Visit: Payer: Self-pay | Admitting: Internal Medicine

## 2015-05-11 ENCOUNTER — Other Ambulatory Visit (INDEPENDENT_AMBULATORY_CARE_PROVIDER_SITE_OTHER): Payer: Medicare Other

## 2015-05-11 ENCOUNTER — Ambulatory Visit (INDEPENDENT_AMBULATORY_CARE_PROVIDER_SITE_OTHER)
Admission: RE | Admit: 2015-05-11 | Discharge: 2015-05-11 | Disposition: A | Discharge status: Home / Self Care | Payer: Medicare Other | Source: Ambulatory Visit | Attending: Internal Medicine | Admitting: Internal Medicine

## 2015-05-11 VITALS — BP 138/82 | HR 84 | Ht 75.0 in | Wt 196.0 lb

## 2015-05-11 DIAGNOSIS — I10 Essential (primary) hypertension: Secondary | ICD-10-CM

## 2015-05-11 DIAGNOSIS — Z79899 Other long term (current) drug therapy: Secondary | ICD-10-CM | POA: Diagnosis not present

## 2015-05-11 DIAGNOSIS — E119 Type 2 diabetes mellitus without complications: Secondary | ICD-10-CM | POA: Diagnosis not present

## 2015-05-11 DIAGNOSIS — R05 Cough: Secondary | ICD-10-CM | POA: Diagnosis not present

## 2015-05-11 DIAGNOSIS — R630 Anorexia: Secondary | ICD-10-CM | POA: Diagnosis not present

## 2015-05-11 DIAGNOSIS — R059 Cough, unspecified: Secondary | ICD-10-CM | POA: Insufficient documentation

## 2015-05-11 DIAGNOSIS — R634 Abnormal weight loss: Secondary | ICD-10-CM

## 2015-05-11 DIAGNOSIS — J189 Pneumonia, unspecified organism: Secondary | ICD-10-CM | POA: Diagnosis not present

## 2015-05-11 DIAGNOSIS — R9389 Abnormal findings on diagnostic imaging of other specified body structures: Secondary | ICD-10-CM

## 2015-05-11 LAB — CBC WITH DIFFERENTIAL/PLATELET
Basophils Absolute: 0.1 10*3/uL (ref 0.0–0.1)
Basophils Relative: 0.7 % (ref 0.0–3.0)
Eosinophils Absolute: 2.7 10*3/uL — ABNORMAL HIGH (ref 0.0–0.7)
HCT: 45.8 % (ref 39.0–52.0)
HEMOGLOBIN: 14.8 g/dL (ref 13.0–17.0)
LYMPHS ABS: 1.9 10*3/uL (ref 0.7–4.0)
LYMPHS PCT: 19.7 % (ref 12.0–46.0)
MCHC: 32.2 g/dL (ref 30.0–36.0)
MCV: 80.7 fl (ref 78.0–100.0)
Monocytes Absolute: 1 10*3/uL (ref 0.1–1.0)
Monocytes Relative: 10.2 % (ref 3.0–12.0)
Neutro Abs: 3.8 10*3/uL (ref 1.4–7.7)
Neutrophils Relative %: 40.5 % — ABNORMAL LOW (ref 43.0–77.0)
Platelets: 409 10*3/uL — ABNORMAL HIGH (ref 150.0–400.0)
RBC: 5.68 Mil/uL (ref 4.22–5.81)
RDW: 16.9 % — ABNORMAL HIGH (ref 11.5–15.5)
WBC: 9.4 10*3/uL (ref 4.0–10.5)

## 2015-05-11 LAB — COMPREHENSIVE METABOLIC PANEL
ALBUMIN: 4 g/dL (ref 3.5–5.2)
ALK PHOS: 65 U/L (ref 39–117)
ALT: 12 U/L (ref 0–53)
AST: 18 U/L (ref 0–37)
BILIRUBIN TOTAL: 0.4 mg/dL (ref 0.2–1.2)
BUN: 12 mg/dL (ref 6–23)
CO2: 29 mEq/L (ref 19–32)
Calcium: 9.7 mg/dL (ref 8.4–10.5)
Chloride: 103 mEq/L (ref 96–112)
Creatinine, Ser: 1.12 mg/dL (ref 0.40–1.50)
GFR: 83.05 mL/min (ref >60.00)
Glucose, Bld: 93 mg/dL (ref 70–99)
Potassium: 3.9 mEq/L (ref 3.5–5.1)
SODIUM: 138 meq/L (ref 135–145)
Total Protein: 7.4 g/dL (ref 6.0–8.3)

## 2015-05-11 LAB — FOLATE: FOLATE: 12.2 ng/mL (ref >5.9)

## 2015-05-11 LAB — LIPASE: Lipase: 32 U/L (ref 11.0–59.0)

## 2015-05-11 LAB — SEDIMENTATION RATE: Sed Rate: 19 mm/hr (ref 0–22)

## 2015-05-11 LAB — TSH: TSH: 1.49 u[IU]/mL (ref 0.35–4.50)

## 2015-05-11 LAB — AMYLASE: Amylase: 57 U/L (ref 27–131)

## 2015-05-11 LAB — IBC PANEL
IRON: 72 ug/dL (ref 42–165)
Saturation Ratios: 21.3 % (ref 20.0–50.0)
TRANSFERRIN: 242 mg/dL (ref 212.0–360.0)

## 2015-05-11 LAB — IRON: IRON: 72 ug/dL (ref 42–165)

## 2015-05-11 LAB — VITAMIN B12: Vitamin B-12: 570 pg/mL (ref 211–911)

## 2015-05-11 MED ORDER — SERTRALINE HCL 50 MG PO TABS: 50.0000 mg | ORAL_TABLET | Freq: Every day | ORAL | Status: DC

## 2015-05-11 NOTE — Patient Instructions (Signed)
Please continue all other medications as before, and refills have been done if requested.  Please have the pharmacy call with any other refills you may need.  Please keep your appointments with your specialists as you may have planned  Please go to the XRAY Department in the Basement (go straight as you get off the elevator) for the x-ray testing  You will be contacted by phone if any changes need to be made immediately.  Otherwise, you will receive a letter about your results with an explanation, but please check with MyChart first.  Please remember to sign up for MyChart if you have not done so, as this will be important to you in the future with finding out test results, communicating by private email, and scheduling acute appointments online when needed.  

## 2015-05-11 NOTE — Progress Notes (Signed)
Pre visit review using our clinic review tool, if applicable. No additional management support is needed unless otherwise documented below in the visit note. 

## 2015-05-11 NOTE — Assessment & Plan Note (Signed)
supsect has some post pna cough, though cant r/o early copd exacerbation, will check cxr as per pt request, and cont meds as is for now, though may need steroid burst if worsens, to cont inhlaers as rx

## 2015-05-11 NOTE — Patient Instructions (Addendum)
You have been scheduled for a CT scan of the abdomen and pelvis at [facility] ([address]---this is in the same building as [facility]).   You are scheduled on 05/17/2015 at 1pm. You should arrive 15 minutes prior to your appointment time for registration. Please follow the written instructions below on the day of your exam:  WARNING: IF YOU ARE ALLERGIC TO IODINE/X-RAY DYE, PLEASE NOTIFY RADIOLOGY IMMEDIATELY AT [phone]! YOU WILL BE GIVEN A 13 HOUR PREMEDICATION PREP.  1) Do not eat or drink anything after 9am (4 hours prior to your test) 2) You have been given 2 bottles of oral contrast to drink. The solution may taste               better if refrigerated, but do NOT add ice or any other liquid to this solution. Shake             well before drinking.    Drink 1 bottle of contrast @ 11am (2 hours prior to your exam)  Drink 1 bottle of contrast @ 12pm (1 hour prior to your exam)  You may take any medications as prescribed with a small amount of water except for the following: Metformin, Glucophage, Glucovance, Avandamet, Riomet, Fortamet, Actoplus Met, Janumet, Glumetza or Metaglip. The above medications must be held the day of the exam AND 48 hours after the exam.  The purpose of you drinking the oral contrast is to aid in the visualization of your intestinal tract. The contrast solution may cause some diarrhea. Before your exam is started, you will be given a small amount of fluid to drink. Depending on your individual set of symptoms, you may also receive an intravenous injection of x-ray contrast/dye. Plan on being at [facility] for 30 minutes or long, depending on the type of exam you are having performed.  This test typically takes 30-45 minutes to complete.  If you have any questions regarding your exam or if you need to reschedule, you may call the CT department at [phone] between the hours of 8:00 am and 5:00 pm,  Monday-Friday.  ________________________________________________________________________ Dr J.John   Go to the basement for labs today  

## 2015-05-11 NOTE — Progress Notes (Signed)
Subjective:    Patient ID: Richard Hopkins, male    DOB: September 23, 1946, 69 y.o.   MRN: [id number]  HPI  Here with co persitent cough, though now scant prod but with midl worsening sob in the past 2 days, not aware of any worseining wheezing, and Pt denies chest pain,  orthopnea, PND, increased LE swelling, palpitations, dizziness or syncope.  Pt denies polydipsia, polyuria, or low sugar symptoms such as weakness or confusion improved with po intake.  Pt states overall good compliance with meds, trying to follow lower cholesterol, diabetic diet, wt overall stable but little exercise however.    States has lost some wt with less appetite for unclear reasons   Pt denies polydipsia, polyuria,  Lab Results  Component Value Date   HGBA1C 6.4 04/03/2015    Wt Readings from Last 3 Encounters:  05/11/15 196 lb (88.905 kg)  04/19/15 199 lb 1.9 oz (90.32 kg)  04/03/15 212 lb (96.163 kg)   Past Medical History  Diagnosis Date  . THRUSH   . ADENOMATOUS COLONIC POLYP   . DIABETES MELLITUS, TYPE II   . HYPERLIPIDEMIA   . SMOKER   . BLURRED VISION   . HYPERTENSION   . ALLERGIC RHINITIS   . CHRONIC OBSTRUCTIVE PULMONARY DISEASE, ACUTE EXACERBATION   . COPD   . PULMONARY FIBROSIS   . ESOPHAGEAL STRICTURE   . GERD   . DIVERTICULAR DISEASE   . BENIGN PROSTATIC HYPERTROPHY   . Acute prostatitis   . EPIDIDYMO-ORCHITIS   . SKIN LESION   . LUMBAR DISC DISORDER   . Pain in soft tissues of limb   . MUSCLE CRAMPS   . FATIGUE   . MASS, SUPERFICIAL   . DYSPNEA   . Wheezing   . DYSPNEA/SHORTNESS OF BREATH   . Nocturia   . PSA, INCREASED   . ABNORMAL ELECTROCARDIOGRAM   . ERECTILE DYSFUNCTION, ORGANIC, HX OF   . SYNCOPE   . Brain tumor   . COPD (chronic obstructive pulmonary disease)   . Chronic steroid use    Past Surgical History  Procedure Laterality Date  . Back surgery      x 4 disabled after 2003  . Elbow arthroscopy      reports that he has been smoking Cigarettes.  He has a 50  pack-year smoking history. He has never used smokeless tobacco. He reports that he does not drink alcohol or use illicit drugs. family history includes Asthma in his mother; Diabetes in his mother; Emphysema in his mother. There is no history of Colon cancer. Allergies  Allergen Reactions  . Doxycycline Nausea Only and Other (See Comments)    Loss of memory; "bad feeling," dizziness  . Ace Inhibitors Cough  . Cardura [Doxazosin Mesylate] Other (See Comments)    dizziness  . Cozaar [Losartan Potassium] Other (See Comments)    dizziness  . Dexilant [Dexlansoprazole] Itching and Rash  . Hydrocodone Nausea Only and Other (See Comments)    dizziness  . Lisinopril Cough  . Norvasc [Amlodipine Besylate] Other (See Comments)    3 times dizzy, bad taste in mouth, none since stopped  . Alfuzosin Rash   Current Outpatient Prescriptions on File Prior to Visit  Medication Sig Dispense Refill  . budesonide (PULMICORT) 0.25 MG/2ML nebulizer solution Take 2 mLs (0.25 mg total) by nebulization 2 (two) times daily. DX: J44.9 120 mL 11  . budesonide-formoterol (SYMBICORT) 160-4.5 MCG/ACT inhaler Inhale 2 puffs into the lungs every 12 (twelve) hours as needed.    Marland Kitchen  cloNIDine (CATAPRES) 0.1 MG tablet Take 1 tablet (0.1 mg total) by mouth 3 (three) times daily. 270 tablet 3  . dexlansoprazole (DEXILANT) 60 MG capsule Take 1 capsule (60 mg total) by mouth daily. 60 capsule 4  . formoterol (PERFOROMIST) 20 MCG/2ML nebulizer solution Take 2 mLs (20 mcg total) by nebulization 2 (two) times daily. DX: J44.9 120 mL 11  . hydrALAZINE (APRESOLINE) 25 MG tablet Take 1 tablet (25 mg total) by mouth 3 (three) times daily. 90 tablet 0  . ipratropium-albuterol (DUONEB) 0.5-2.5 (3) MG/3ML SOLN Take 3 mLs by nebulization every 6 (six) hours as needed (wheezing/shortness of breath). 60 mL 11  . lovastatin (MEVACOR) 20 MG tablet Take 40 mg by mouth daily at 6 PM.    . metFORMIN (GLUCOPHAGE) 500 MG tablet Take 1,000 mg by  mouth 2 (two) times daily with a meal.    . nicotine (NICODERM CQ) 14 mg/24hr patch Place 1 patch (14 mg total) onto the skin daily. 28 patch 0  . omeprazole (PRILOSEC) 40 MG capsule Take 1 capsule by mouth every afternoon. 30 capsule 6  . sertraline (ZOLOFT) 50 MG tablet Take 1 tablet (50 mg total) by mouth daily. 30 tablet 1  . sucralfate (CARAFATE) 1 G tablet Take 1 tablet (1 g total) by mouth 2 (two) times daily. 60 tablet 3  . triamcinolone (NASACORT AQ) 55 MCG/ACT AERO nasal inhaler Place 2 sprays into the nose daily. 1 Inhaler 12   No current facility-administered medications on file prior to visit.   Review of Systems  Constitutional: Negative for unusual diaphoresis or night sweats HENT: Negative for ringing in ear or discharge Eyes: Negative for double vision or worsening visual disturbance.  Respiratory: Negative for choking and stridor.   Gastrointestinal: Negative for vomiting or other signifcant bowel change Genitourinary: Negative for hematuria or change in urine volume.  Musculoskeletal: Negative for other MSK pain or swelling Skin: Negative for color change and worsening wound.  Neurological: Negative for tremors and numbness other than noted  Psychiatric/Behavioral: Negative for decreased concentration or agitation other than above       Objective:   Physical Exam There were no vitals taken for this visit. VS noted,  Constitutional: Pt appears in no significant distress HENT: Head: NCAT.  Right Ear: External ear normal.  Left Ear: External ear normal.  Eyes: . Pupils are equal, round, and reactive to light. Conjunctivae and EOM are normal Neck: Normal range of motion. Neck supple.  Cardiovascular: Normal rate and regular rhythm.   Pulmonary/Chest: Effort normal and breath sounds decrease without rales but with rare isolate right lower lung field wheeze  Abd:  Soft, NT, ND, + BS Neurological: Pt is alert. Not confused , motor grossly intact Skin: Skin is warm. No  rash, no LE edema Psychiatric: Pt behavior is normal. No agitation.      Assessment & Plan:

## 2015-05-11 NOTE — Assessment & Plan Note (Signed)
stable overall by history and exam, recent data reviewed with pt, and pt to continue medical treatment as before,  to f/u any worsening symptoms or concerns Lab Results  Component Value Date   HGBA1C 6.4 04/03/2015

## 2015-05-11 NOTE — Progress Notes (Signed)
POLK MINOR [birth year] [id number]  Note: This dictation was prepared with Dragon digital system. Any transcriptional errors that result from this procedure are unintentional.   History of Present Illness: This is a very nice 69 year old gentleman whom I  have followed for many years for gastroesophageal reflux and for colorectal screening. He used to weigh 250 pounds but the last few years his weight has decreased to 218 pounds in 2012, 217 pounds in March 2014, 200 pounds in September 2015. And 196 pounds today. He reports poor appetite. He barely eats any breakfast. His main meal is supper. He has no nausea, vomiting, abdominal pain or change in bowel habits. 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 He is concerned about this weight loss and is somewhat depressed about it. Last upper endoscopy in 2010 showed intestinal metaplasia and in March 2014 gastritis with focal metaplasia. His colonoscopy in January 2014 revealed multiple adenomatous polyps. He will be due for recall colonoscopy and endoscopy in January 2017. He has   BPH, nocturia 5 and COPD for which he takes several nebulizers,    Past Medical History  Diagnosis Date  . THRUSH   . ADENOMATOUS COLONIC POLYP   . DIABETES MELLITUS, TYPE II   . HYPERLIPIDEMIA   . SMOKER   . BLURRED VISION   . HYPERTENSION   . ALLERGIC RHINITIS   . CHRONIC OBSTRUCTIVE PULMONARY DISEASE, ACUTE EXACERBATION   . COPD   . PULMONARY FIBROSIS   . ESOPHAGEAL STRICTURE   . GERD   . DIVERTICULAR DISEASE   . BENIGN PROSTATIC HYPERTROPHY   . Acute prostatitis   . EPIDIDYMO-ORCHITIS   . SKIN LESION   . LUMBAR DISC DISORDER   . Pain in soft tissues of limb   . MUSCLE CRAMPS   . FATIGUE   . MASS, SUPERFICIAL   . DYSPNEA   . Wheezing   . DYSPNEA/SHORTNESS OF BREATH   . Nocturia   . PSA, INCREASED   . ABNORMAL ELECTROCARDIOGRAM   . ERECTILE DYSFUNCTION, ORGANIC, HX OF   . SYNCOPE   . Brain tumor   . COPD (chronic  obstructive pulmonary disease)   . Chronic steroid use     Past Surgical History  Procedure Laterality Date  . Back surgery      x 4 disabled after 2003  . Elbow arthroscopy      Allergies  Allergen Reactions  . Doxycycline Nausea Only and Other (See Comments)    Loss of memory; "bad feeling," dizziness  . Ace Inhibitors Cough  . Cardura [Doxazosin Mesylate] Other (See Comments)    dizziness  . Cozaar [Losartan Potassium] Other (See Comments)    dizziness  . Dexilant [Dexlansoprazole] Itching and Rash  . Hydrocodone Nausea Only and Other (See Comments)    dizziness  . Lisinopril Cough  . Norvasc [Amlodipine Besylate] Other (See Comments)    3 times dizzy, bad taste in mouth, none since stopped  . Alfuzosin Rash    Family history and social history have been reviewed.  Review of Systems: Decreased level of energy. Appetite. Weight loss  The remainder of the 10 point ROS is negative except as outlined in the H&P  Physical Exam: General Appearance Well developed, in no distress, appears depressed Eyes  Non icteric  HEENT  Non traumatic, normocephalic  Mouth No lesion, tongue papillated, no cheilosis Neck Supple without adenopathy, thyroid not enlarged, no carotid bruits, no JVD Lungs Clear to auscultation bilaterally no wheezes COR Normal  S1, normal S2, regular rhythm, no murmur, quiet precordium Abdomen soft, nontender, normoactive bowel sounds. Liver edge at costal margin area no fullness Rectal yellow Hemoccult-negative stool Extremities  No pedal edema Skin No lesions Neurological Alert and oriented x 3 Psychological Normal mood and affect, patient was teary-eyed during our conversation  Assessment and Plan:   69 year old African-American male who has had gradual weight loss without  specific GI symptoms. Main reason is decreased appetite. It could be related to depression or due to COPD or occult malignancy. He is  concerned about possibility of cancer. We will  proceed with CT scan of the abdomen and pelvis with attention to the pancreas and at the same time we will check his amylase, lipase as well as his blood count, B12, sedimentation rate, liver function tests as well as TSH  History of gastroesophageal reflux he will be due for upper endoscopy in January 2017 to follow-up of on intestinal metaplasia Multiple adenomatous polyps in January 2014. She will be due for repeat colonoscopy in January 2017  Suspected the depression. We'll start Zoloft 50 mg daily     Delfin Edis 05/11/2015

## 2015-05-11 NOTE — Assessment & Plan Note (Signed)
stable overall by history and exam, recent data reviewed with pt, and pt to continue medical treatment as before,  to f/u any worsening symptoms or concerns BP Readings from Last 3 Encounters:  05/11/15 138/82  04/19/15 136/88  04/03/15 132/80

## 2015-05-14 ENCOUNTER — Telehealth: Payer: Self-pay | Admitting: Internal Medicine

## 2015-05-14 NOTE — Telephone Encounter (Signed)
Please call patient regarding x-ray results

## 2015-05-15 NOTE — Telephone Encounter (Signed)
See Lab results note

## 2015-05-17 ENCOUNTER — Ambulatory Visit (INDEPENDENT_AMBULATORY_CARE_PROVIDER_SITE_OTHER)
Admission: RE | Admit: 2015-05-17 | Discharge: 2015-05-17 | Disposition: A | Discharge status: Home / Self Care | Payer: Medicare Other | Source: Ambulatory Visit | Attending: Internal Medicine | Admitting: Internal Medicine

## 2015-05-17 DIAGNOSIS — R634 Abnormal weight loss: Secondary | ICD-10-CM | POA: Diagnosis not present

## 2015-05-17 DIAGNOSIS — R630 Anorexia: Secondary | ICD-10-CM | POA: Diagnosis not present

## 2015-05-17 DIAGNOSIS — N281 Cyst of kidney, acquired: Secondary | ICD-10-CM | POA: Diagnosis not present

## 2015-05-17 MED ORDER — IOHEXOL 300 MG/ML  SOLN
100.0000 mL | Freq: Once | INTRAMUSCULAR | Status: AC | PRN
  Administered 2015-05-17: 100 mL via INTRAVENOUS

## 2015-05-29 ENCOUNTER — Ambulatory Visit (INDEPENDENT_AMBULATORY_CARE_PROVIDER_SITE_OTHER)
Admission: RE | Admit: 2015-05-29 | Discharge: 2015-05-29 | Disposition: A | Discharge status: Home / Self Care | Payer: Medicare Other | Source: Ambulatory Visit | Attending: Internal Medicine | Admitting: Internal Medicine

## 2015-05-29 ENCOUNTER — Other Ambulatory Visit: Payer: Self-pay | Admitting: Internal Medicine

## 2015-05-29 ENCOUNTER — Encounter: Payer: Self-pay | Admitting: Internal Medicine

## 2015-05-29 DIAGNOSIS — R918 Other nonspecific abnormal finding of lung field: Secondary | ICD-10-CM | POA: Diagnosis not present

## 2015-05-29 DIAGNOSIS — R938 Abnormal findings on diagnostic imaging of other specified body structures: Secondary | ICD-10-CM | POA: Diagnosis not present

## 2015-05-29 DIAGNOSIS — J8410 Pulmonary fibrosis, unspecified: Secondary | ICD-10-CM | POA: Diagnosis not present

## 2015-05-29 DIAGNOSIS — R9389 Abnormal findings on diagnostic imaging of other specified body structures: Secondary | ICD-10-CM

## 2015-05-29 DIAGNOSIS — R933 Abnormal findings on diagnostic imaging of other parts of digestive tract: Secondary | ICD-10-CM

## 2015-06-01 ENCOUNTER — Ambulatory Visit (INDEPENDENT_AMBULATORY_CARE_PROVIDER_SITE_OTHER): Payer: Medicare Other | Admitting: Internal Medicine

## 2015-06-01 ENCOUNTER — Encounter: Payer: Self-pay | Admitting: Internal Medicine

## 2015-06-01 VITALS — BP 144/92 | HR 88 | Ht 75.0 in | Wt 192.0 lb

## 2015-06-01 DIAGNOSIS — J8410 Pulmonary fibrosis, unspecified: Secondary | ICD-10-CM

## 2015-06-01 DIAGNOSIS — J449 Chronic obstructive pulmonary disease, unspecified: Secondary | ICD-10-CM

## 2015-06-01 DIAGNOSIS — H8110 Benign paroxysmal vertigo, unspecified ear: Secondary | ICD-10-CM

## 2015-06-01 MED ORDER — MECLIZINE HCL 32 MG PO TABS: 32.0000 mg | ORAL_TABLET | Freq: Three times a day (TID) | ORAL | Status: DC | PRN

## 2015-06-01 MED ORDER — ALBUTEROL SULFATE HFA 108 (90 BASE) MCG/ACT IN AERS: INHALATION_SPRAY | RESPIRATORY_TRACT | Status: DC

## 2015-06-01 NOTE — Progress Notes (Signed)
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 DLCO is normal. If there is convincing progression of dyspnea or desaturation with exercise first step is stop smoking then consider HRCT if not improving and pulmonary f/u prn  03/07/13 pulmonary consultation /Joyice Magda requested by Dr Marshall Cork re abd CT with MPN cc minimal increase doe x 2 years, really not limiting activity assoc with dry cough.  rec Work on inhaler technique Return  for pfts> did not return    10/25/2014 f/u ov/Vitaliy Eisenhour re: GOLD III/IV copd and still smoking  Chief Complaint  Patient presents with  . Follow-up    PFT done today. Pt states that his breathing has been worse since last visit. He has been cutting back on smoking, and feels that this is making breathing worse. He also c/o increased cough with clear sputum for the past 1 to 2 wks. He has been using neb daily for the past 3 days.     Not really consistent with symbicort but thinks saba really helps him  Cough mostly in ams > not purulent  Doe x > slow adls  rec Stop symbiocort for now and try anoro one click each am x 2 deep drags The key is to stop smoking completely before smoking completely stops you!  mucinex up to 1200 mg every 12 hours as needed for congestion Please schedule a follow up office visit in 6 weeks, call sooner if needed  Add:  If worsens Pred 20 mg per day until better then 10 mg per day until seen      02/14/2015 f/u  ov/Itzel Mckibbin re: GOLD III/ IV copd / still smoking  Chief Complaint  Patient presents with  . Follow-up    Pt states that his breathing has overall been doing well. He liked the Anoro but ins does not cover. He is using symbicort about 2 x per wk. He has not needed neb.   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 Treated with a prednisone taper and started on Symbicort.  continues to smoke, smoking cessation was discussed. He says he is feeling improved with decreased cough and shortness of breath. He denies any hemoptysis, chest pain, orthopnea, PND or leg swelling. He does not have insurance for rx , he can not afford inhalers.  We discussed neb meds.  rec Begin Pulmicort and Perforomist Neb Twice daily  , rinse after use.  Must quit smoking     06/01/2015 f/u ov/Jomo Forand re: copd III/IV still smoing   Chief Complaint  Patient presents with  .  Follow-up    Pt c/o chest congestion and sinus congestion with PND. Prod cough with clear thick mucus x 1 month and weight loss.  very confused with meds/ names / timing - I believe he's actually on pulmicort/duoneb due to has trouble affording /using symbicort  Doe x more than slow pace = MMRC 2  No obvious day to day or daytime variabilty or assoc purulent mucus or cp or chest tightness, subjective wheeze overt sinus or hb symptoms. No unusual exp hx or h/o childhood pna/ asthma or knowledge of premature birth.  Sleeping ok without nocturnal  Exacerbation  of respiratory  c/o's or need for noct saba. Also denies any obvious fluctuation of symptoms with weather or environmental changes or other aggravating or alleviating factors except as outlined above   Current  Medications, Allergies, Complete Past Medical History, Past Surgical History, Family History, and Social History were reviewed in [employer] record.  ROS  The following are not active complaints unless bolded sore throat, dysphagia, dental problems, itching, sneezing,  nasal congestion or excess/ purulent secretions, ear ache,   fever, chills, sweats, unintended wt loss, pleuritic or exertional cp, hemoptysis,  orthopnea pnd or leg swelling, presyncope, palpitations, heartburn, abdominal pain, anorexia, nausea, vomiting, diarrhea  or change in bowel or urinary habits, change in stools or urine, dysuria,hematuria,  rash, arthralgias, visual complaints, headache, numbness weakness or ataxia or problems with walking or coordination,  change in mood/affect or memory/ vertigo                Past Medical History:  COPD............................................Marland KitchenWert  - 2009 FEV1 baseline of 1.8 > course of prednisone with an FEV1 up to 2.45 with a ratio of 57%  - ? 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 Dentures full except 2 bottom teeth Oropharynx no thrush or excess pnd or cobblestoning. No JVD or cervical adenopathy. Mild accessory muscle hypertrophy. Trachea midline, nl thryroid. Chest was hyperinflated by percussion with diminished breath sounds and moderate increased exp time with , decreased BS in bases . Hoover sign positive at mid  inspiration. Regular rate and rhythm without murmur gallop or rub or increase P2. Abd: no hsm, nl excursion. Ext warm without cyanosis clubbing or edema.      I personally reviewed images and agree with radiology impression as follows:  CXR:  05/29/15 1. No acute/focal pneumonia. Recent chest x-ray findings was likely related to the patient's chronic lung disease. 2. Interstitial lung disease with fibrosis. There is a usual interstitial pneumonia pattern, although given relative stability from 2014 fibrotic NSIP should also be considered. 3. Numerous new peribronchovascular cysts and nodules, not typical of #2. Patient demographics favors Langerhans cell histiocytosis, but basilar predominance is atypical. Consider 12 month follow-up high-resolution chest CT to evaluate for stability. 4. Severe mid esophageal thickening in this patient  with history of esophageal stricture.     Assessment & Plan:

## 2015-06-01 NOTE — Patient Instructions (Signed)
Automatically take budesonide twice daily and duoneb 4x daily   Only use your proair as a rescue medication to be used if you can't catch your breath by resting or doing a relaxed purse lip breathing pattern.  - The less you use it, the better it will work when you need it. - Ok to use up to 2 puffs  every 4 hours if you must but call for immediate appointment if use goes up over your usual need - Don't leave home without it !!  (think of it like the spare tire for your car)   For dizziness try meclizine 25 mg every 6 hours as needed   The key is to stop smoking completely before smoking completely stops you!    If you are satisfied with your treatment plan,  let your doctor know and he/she can either refill your medications or you can return here when your prescription runs out.     If in any way you are not 100% satisfied,  please tell us.  If 100% better, tell your friends!  Pulmonary follow up is as needed

## 2015-06-02 DIAGNOSIS — H8110 Benign paroxysmal vertigo, unspecified ear: Secondary | ICD-10-CM | POA: Insufficient documentation

## 2015-06-02 NOTE — Assessment & Plan Note (Signed)
-   PFT's 10/10/10 FEV1 2.43 (69%) ratio 59 and no better p B2, DLC0 79%  - PFTs 10/25/2014  FEV1 1.05 (30%)  Ratio 45 no better and corrects to 100% and dlco 45  - 10/25/2014 trial of anoro and as needed duoneb > improved but insurance wouldn't pay   I had an extended final summary discussion with the patient reviewing all relevant studies completed to date and  lasting 15 to 20 minutes of a 25 minute visit on the following issues:    1) not really able to help him in a specialty clinic if not willing to meet [place] half way with commitment to quit smoking and learn meds (not consistent to report inablity to pay for meds but still paying for cigarettes)  2) Each maintenance medication was reviewed in detail including most importantly the difference between maintenance and as needed and under what circumstances the prns are to be used.  Please see instructions for details which were reviewed in writing and the patient given a copy.    3) pulmonary f/u can be prn at Dr Gwynn Burly discretion

## 2015-06-02 NOTE — Assessment & Plan Note (Signed)
Not able to provoke it here/ intermittent but new symptom x sev months  rec Trial of prn meclizine 25 mg q 6 h prn and f/u per primary care

## 2015-06-02 NOTE — Assessment & Plan Note (Signed)
-   03/07/2013  Walked RA  3 laps @ 185 ft each stopped due to  End of study, sats 94%  He could have one of the cig related forms of PF but would not consider any form of w/u unless/ until he stopped smoking x 3 m and still showed progression.

## 2015-06-07 ENCOUNTER — Telehealth: Payer: Self-pay | Admitting: Internal Medicine

## 2015-06-07 NOTE — Telephone Encounter (Signed)
Pt advised that he should follow Dr Melvyn Novas recommendation, no interactions with other medication, He was also advised that Rx was sent in by Dr Melvyn Novas

## 2015-06-07 NOTE — Telephone Encounter (Signed)
Pt called in said that he seen Dr wart last week and he told him that he could take Meclizine for his Huston Foley but pt should clear it though Dr Jenny Reichmann 1st.  He wanted to know if Dr Jenny Reichmann would ok him to take this med and call some in?     Best number [phone]

## 2015-06-21 ENCOUNTER — Other Ambulatory Visit: Payer: Self-pay | Admitting: Internal Medicine

## 2015-06-21 ENCOUNTER — Ambulatory Visit: Payer: Medicare Other | Admitting: Gastroenterology

## 2015-07-05 ENCOUNTER — Ambulatory Visit (INDEPENDENT_AMBULATORY_CARE_PROVIDER_SITE_OTHER): Payer: Medicare Other | Admitting: Internal Medicine

## 2015-07-05 ENCOUNTER — Encounter: Payer: Self-pay | Admitting: Internal Medicine

## 2015-07-05 VITALS — BP 144/90 | HR 97 | Temp 97.8°F | Resp 18 | Wt 186.0 lb

## 2015-07-05 DIAGNOSIS — J441 Chronic obstructive pulmonary disease with (acute) exacerbation: Secondary | ICD-10-CM | POA: Diagnosis not present

## 2015-07-05 DIAGNOSIS — J0111 Acute recurrent frontal sinusitis: Secondary | ICD-10-CM | POA: Diagnosis not present

## 2015-07-05 DIAGNOSIS — B370 Candidal stomatitis: Secondary | ICD-10-CM

## 2015-07-05 MED ORDER — CLOTRIMAZOLE 10 MG MT TROC: 10.0000 mg | Freq: Every day | OROMUCOSAL | Status: DC

## 2015-07-05 MED ORDER — PREDNISONE 10 MG PO TABS: ORAL_TABLET | ORAL | Status: DC

## 2015-07-05 MED ORDER — AMOXICILLIN 500 MG PO CAPS: 500.0000 mg | ORAL_CAPSULE | Freq: Three times a day (TID) | ORAL | Status: DC

## 2015-07-05 NOTE — Progress Notes (Signed)
   Subjective:    Patient ID: Richard Hopkins, male    DOB: August 27, 1946, 69 y.o.   MRN: [id number]  HPI He describes progressive respiratory symptoms in the last month. Initial symptoms were cough and nasal congestion. The cough is productive of white phlegm. The symptoms are associated with progressive exertional dyspnea. He describes copious rhinorrhea in the early mornings. These are described as thick and yellow. He has intermittent frontal headaches. He also describes subjective fever and chills.  CT scan 05/29/15 revealed interstitial lung disease with some fibrosis. There was no pneumonia.  He is concerned that his energy level is low. This was most striking when prednisone was discontinued.  He's concerned that he has been losing weight. This was evaluated in May by his gastroenterologist. He actually has lost 10 pounds since that time. There was no anemia and his TSH was therapeutic. He denies melena or rectal bleeding.  He has not used his albuterol because of expense. He uses budesonide nebulizer twice a day. He is not gargling after each treatment.  He continues to smoke 4 cigarettes per day.  He is on metformin twice a day. He is not checking his sugars. His last A1c was 6.4% in April.     Review of Systems  He denies facial pain, otic pain, otic discharge, or sore throat.  He has had vertigo for which Dr. Melvyn Novas prescribed  Bonine as needed.    Objective:   Physical Exam  Pertinent or positive findings include: He appears somewhat chronically ill. There is increased wax in the otic canals, left greater than the right. He has an upper and lower partial. Oropharyngeal patches of thrush are present. Breath sounds are distant. Grade 1/2 systolic murmur is present. Pedal pulses are decreased. Deep tendon reflexes at the knees are 0+.  General appearance :adequately nourished; in no distress.  Eyes: No conjunctival inflammation or scleral icterus is present.  Oral exam:  Lips  and gums are healthy appearing.There is no oropharyngeal erythema or exudate noted.   Heart:  Normal rate and regular rhythm. S1 and S2 normal without gallop, click, rub or other extra sounds    Lungs:Chest clear to auscultation; no wheezes, rhonchi,rales ,or rubs present.No increased work of breathing.   Abdomen: bowel sounds normal, soft and non-tender without masses, organomegaly or hernias noted.  No guarding or rebound.   Vascular : all pulses equal ; no bruits present.  Skin:Warm & dry.  Intact without suspicious lesions or rashes ; no tenting   Lymphatic: No lymphadenopathy is noted about the head, neck, axilla   Neuro: Strength, tone decreased        Assessment & Plan:  #1 COPD exacerbation in context of interstitial lung disease with pulmonary fibrosis  #2 frontal sinusitis  #3oral thrush  #4 diabetes with adequate control based on the April A1c  #5 subjective fatigue with no explanation on extensive labs in May.Subjective improvement on steroids  Plan: See orders and recommendations

## 2015-07-05 NOTE — Patient Instructions (Addendum)
Plain Mucinex (NOT D) for thick secretions ;force NON dairy fluids .   Nasal cleansing in the shower as discussed with lather of mild shampoo.After 10 seconds wash off lather while  exhaling through nostrils. Make sure that all residual soap is removed to prevent irritation.  Flonase OR Nasacort AQ 1 spray in each nostril twice a day as needed. Use the "crossover" technique into opposite nostril spraying toward opposite ear @ 45 degree angle, not straight up into nostril.  Plain Allegra (NOT D )  160 daily , Loratidine 10 mg , OR Zyrtec 10 mg @ bedtime  as needed for itchy eyes & sneezing.   Clotrimazole lozenges dissolved orally & swallowed 5 times a day.  Please think about quitting smoking. Review the risks we discussed. Please call 1-800-QUIT-NOW [phone]) for free smoking cessation counseling. There are multiple options are to help you stop smoking. These include nicotine patches, nicotine gum, and the new "E cigarette".

## 2015-07-05 NOTE — Progress Notes (Signed)
Pre visit review using our clinic review tool, if applicable. No additional management support is needed unless otherwise documented below in the visit note. 

## 2015-07-24 ENCOUNTER — Ambulatory Visit: Payer: Medicare Other | Admitting: Gastroenterology

## 2015-07-25 ENCOUNTER — Ambulatory Visit (INDEPENDENT_AMBULATORY_CARE_PROVIDER_SITE_OTHER): Payer: Medicare Other | Admitting: Gastroenterology

## 2015-07-25 ENCOUNTER — Encounter: Payer: Self-pay | Admitting: Gastroenterology

## 2015-07-25 VITALS — BP 126/80 | HR 84 | Ht 74.25 in | Wt 185.1 lb

## 2015-07-25 DIAGNOSIS — R634 Abnormal weight loss: Secondary | ICD-10-CM | POA: Diagnosis not present

## 2015-07-25 DIAGNOSIS — K219 Gastro-esophageal reflux disease without esophagitis: Secondary | ICD-10-CM

## 2015-07-25 DIAGNOSIS — R938 Abnormal findings on diagnostic imaging of other specified body structures: Secondary | ICD-10-CM

## 2015-07-25 DIAGNOSIS — R933 Abnormal findings on diagnostic imaging of other parts of digestive tract: Secondary | ICD-10-CM | POA: Diagnosis not present

## 2015-07-25 DIAGNOSIS — R9389 Abnormal findings on diagnostic imaging of other specified body structures: Secondary | ICD-10-CM

## 2015-07-25 NOTE — Progress Notes (Signed)
Reviewed and agree.

## 2015-07-25 NOTE — Progress Notes (Signed)
     07/25/2015 Richard Hopkins [id number] [birth year]   History of Present Illness:  This is a 69 year old male who is known to myself and Dr. Olevia Perches for treatment of his GERD (also has history of Barrett's esophagus as seen on biopsies from EGD in 2010).  He presents to our office today to schedule EGD for evaluation of an abnormal CT scan of the chest.  He's had 32 pound weight loss since 02/2013.  CT scan of the abdomen and pelvis was unremarkable.  He is up-to-date with his colonoscopy, which was performed in January 2014 at which time he had 5 sessile polyps removed from the ascending colon. These were tubular adenomas and it was recommended that he have a repeat colonoscopy in 5 years from that time.  CT scan of the chest on 6/7 showed "Severe mid esophageal thickening in this patient with history of esophageal stricture. Please ensure the patient is followed with endoscopy as esophageal carcinoma can have this exact appearance."  He had a similar finding on chest CT in 02/2013.  EGD in 02/2013 did not show any findings to correlate with what was seen on CT scan.  Biopsies on this occasion did not show any intestinal metaplasia.  He is currently on omeprazole 40 mg daily and felt like his reflux was doing well overall.  He is concerned about his weight loss, however.   Current Medications, Allergies, Past Medical History, Past Surgical History, Family History and Social History were reviewed in [employer] record.   Physical Exam: BP 126/80 mmHg  Pulse 84  Ht 6' 2.25" (1.886 m)  Wt 185 lb 2 oz (83.972 kg)  BMI 23.61 kg/m2 General: Well developed black male in no acute distress Head: Normocephalic and atraumatic Eyes:  Sclerae anicteric, conjunctiva pink  Ears: Normal auditory acuity Lungs: Clear throughout to auscultation Heart: Regular rate and rhythm Abdomen: Soft, non-distended.  Normal bowel sounds.  Non-tender. Musculoskeletal: Symmetrical with no gross  deformities  Extremities: No edema  Neurological: Alert oriented x 4, grossly non-focal Psychological:  Alert and cooperative. Normal mood and affect  Assessment and Recommendations: -Abnormal CT scan of the chest showing severe mid-esophageal thickening.  Had a CT scan in 2014 showing the same thing and EGD did not show any findings to correlate with this.  Will schedule for another EGD.  GERD symptoms seem well controlled at this point on omeprazole 40 mg daily.  The risks, benefits, and alternatives were discussed with the patient and he consents to proceed.  -Weight loss, 32 pounds in 2 years:  CT scan abdomen and pelvis unremarkable.  Colonoscopy up-to-date (12/2012). -History of Barrett's esophagus:  Seen on biopsies from 2010, but biopsies from 02/2013 were negative.

## 2015-07-25 NOTE — Patient Instructions (Signed)
You have been scheduled for an endoscopy. Please follow written instructions given to you at your visit today. If you use inhalers (even only as needed), please bring them with you on the day of your procedure.   

## 2015-08-08 ENCOUNTER — Ambulatory Visit (INDEPENDENT_AMBULATORY_CARE_PROVIDER_SITE_OTHER): Payer: Medicare Other | Admitting: Internal Medicine

## 2015-08-08 ENCOUNTER — Encounter: Payer: Self-pay | Admitting: Internal Medicine

## 2015-08-08 VITALS — BP 134/74 | HR 100 | Temp 98.1°F | Ht 74.0 in | Wt 188.0 lb

## 2015-08-08 DIAGNOSIS — J449 Chronic obstructive pulmonary disease, unspecified: Secondary | ICD-10-CM

## 2015-08-08 DIAGNOSIS — J019 Acute sinusitis, unspecified: Secondary | ICD-10-CM | POA: Diagnosis not present

## 2015-08-08 DIAGNOSIS — E119 Type 2 diabetes mellitus without complications: Secondary | ICD-10-CM | POA: Diagnosis not present

## 2015-08-08 MED ORDER — AMOXICILLIN-POT CLAVULANATE 875-125 MG PO TABS: 1.0000 | ORAL_TABLET | Freq: Two times a day (BID) | ORAL | Status: DC

## 2015-08-08 MED ORDER — PREDNISONE 10 MG PO TABS: ORAL_TABLET | ORAL | Status: DC

## 2015-08-08 MED ORDER — IPRATROPIUM-ALBUTEROL 0.5-2.5 (3) MG/3ML IN SOLN: 3.0000 mL | Freq: Four times a day (QID) | RESPIRATORY_TRACT | Status: DC | PRN

## 2015-08-08 MED ORDER — METHYLPREDNISOLONE ACETATE 80 MG/ML IJ SUSP
80.0000 mg | Freq: Once | INTRAMUSCULAR | Status: AC
  Administered 2015-08-08: 80 mg via INTRAMUSCULAR

## 2015-08-08 MED ORDER — HYDROCODONE-HOMATROPINE 5-1.5 MG/5ML PO SYRP: 5.0000 mL | ORAL_SOLUTION | Freq: Four times a day (QID) | ORAL | Status: DC | PRN

## 2015-08-08 NOTE — Assessment & Plan Note (Signed)
With mild exac  - for depomedrol IM, then predpac asd,  to f/u any worsening symptoms or concerns

## 2015-08-08 NOTE — Progress Notes (Signed)
Subjective:    Patient ID: Richard Hopkins, male    DOB: 1946/04/19, 69 y.o.   MRN: [id number]  HPI   Here with 2-3 days acute onset fever, facial pain, pressure, headache, general weakness and malaise, and greenish d/c, with mild ST and cough, but pt denies chest pain, wheezing, increased sob or doe, orthopnea, PND, increased LE swelling, palpitations, dizziness or syncope.  Did see Dr Linna Darner  July 14, but antiobitc did not seem to completely clear.  Now with flare similar symptoms, also with mild worsening wheezing, cough, and asks for refill neb tx. Past Medical History  Diagnosis Date  . THRUSH   . ADENOMATOUS COLONIC POLYP   . DIABETES MELLITUS, TYPE II   . HYPERLIPIDEMIA   . SMOKER   . BLURRED VISION   . HYPERTENSION   . ALLERGIC RHINITIS   . CHRONIC OBSTRUCTIVE PULMONARY DISEASE, ACUTE EXACERBATION   . COPD   . PULMONARY FIBROSIS   . ESOPHAGEAL STRICTURE   . GERD   . DIVERTICULAR DISEASE   . BENIGN PROSTATIC HYPERTROPHY   . Acute prostatitis   . EPIDIDYMO-ORCHITIS   . SKIN LESION   . LUMBAR DISC DISORDER   . Pain in soft tissues of limb   . MUSCLE CRAMPS   . FATIGUE   . MASS, SUPERFICIAL   . DYSPNEA   . Wheezing   . DYSPNEA/SHORTNESS OF BREATH   . Nocturia   . PSA, INCREASED   . ABNORMAL ELECTROCARDIOGRAM   . ERECTILE DYSFUNCTION, ORGANIC, HX OF   . SYNCOPE   . Brain tumor   . COPD (chronic obstructive pulmonary disease)   . Chronic steroid use    Past Surgical History  Procedure Laterality Date  . Back surgery      x 4 disabled after 2003  . Elbow arthroscopy      reports that he has been smoking Cigarettes.  He has a 50 pack-year smoking history. He has never used smokeless tobacco. He reports that he does not drink alcohol or use illicit drugs. family history includes Asthma in his mother; Diabetes in his mother; Emphysema in his mother. There is no history of Colon cancer. Allergies  Allergen Reactions  . Doxycycline Nausea Only and Other (See  Comments)    Loss of memory; "bad feeling," dizziness  . Ace Inhibitors Cough  . Cardura [Doxazosin Mesylate] Other (See Comments)    dizziness  . Cozaar [Losartan Potassium] Other (See Comments)    dizziness  . Dexilant [Dexlansoprazole] Itching and Rash  . Hydrocodone Nausea Only and Other (See Comments)    dizziness  . Lisinopril Cough  . Norvasc [Amlodipine Besylate] Other (See Comments)    3 times dizzy, bad taste in mouth, none since stopped  . Alfuzosin Rash   Current Outpatient Prescriptions on File Prior to Visit  Medication Sig Dispense Refill  . albuterol (PROAIR HFA) 108 (90 BASE) MCG/ACT inhaler 2 puffs every 4 hours as needed only  if your can't catch your breath 1 Inhaler 11  . budesonide (PULMICORT) 0.25 MG/2ML nebulizer solution Take 2 mLs (0.25 mg total) by nebulization 2 (two) times daily. DX: J44.9 120 mL 11  . cloNIDine (CATAPRES) 0.1 MG tablet Take 1 tablet (0.1 mg total) by mouth 3 (three) times daily. 270 tablet 3  . formoterol (PERFOROMIST) 20 MCG/2ML nebulizer solution Take 2 mLs (20 mcg total) by nebulization 2 (two) times daily. DX: J44.9 120 mL 11  . hydrochlorothiazide (HYDRODIURIL) 25 MG tablet Take 25 mg  by mouth daily.    Marland Kitchen lovastatin (MEVACOR) 20 MG tablet Take 40 mg by mouth daily at 6 PM.    . meclizine (ANTIVERT) 32 MG tablet Take 1 tablet (32 mg total) by mouth 3 (three) times daily as needed. 30 tablet 11  . metFORMIN (GLUCOPHAGE) 500 MG tablet TAKE ONE TABLET BY MOUTH TWICE DAILY WITH  A  MEAL 60 tablet 5  . omeprazole (PRILOSEC) 40 MG capsule Take 1 capsule by mouth every afternoon. 30 capsule 6  . predniSONE (DELTASONE) 10 MG tablet 1 qod 15 tablet 0   No current facility-administered medications on file prior to visit.    Review of Systems  Constitutional: Negative for unusual diaphoresis or night sweats HENT: Negative for ringing in ear or discharge Eyes: Negative for double vision or worsening visual disturbance.  Respiratory: Negative  for choking and stridor.   Gastrointestinal: Negative for vomiting or other signifcant bowel change Genitourinary: Negative for hematuria or change in urine volume.  Musculoskeletal: Negative for other MSK pain or swelling Skin: Negative for color change and worsening wound.  Neurological: Negative for tremors and numbness other than noted  Psychiatric/Behavioral: Negative for decreased concentration or agitation other than above       Objective:   Physical Exam BP 134/74 mmHg  Pulse 100  Temp(Src) 98.1 F (36.7 C) (Oral)  Ht 6\' 2"  (1.88 m)  Wt 188 lb (85.276 kg)  BMI 24.13 kg/m2  SpO2 95% VS noted, mild ill appearing Constitutional: Pt appears in no significant distress HENT: Head: NCAT.  Right Ear: External ear normal.  Left Ear: External ear normal.  Eyes: . Pupils are equal, round, and reactive to light. Conjunctivae and EOM are normal Bilat tm's with mild erythema.  Max sinus areas mild tender.  Pharynx with mild erythema, no exudate Neck: Normal range of motion. Neck supple.  Cardiovascular: Normal rate and regular rhythm.   Pulmonary/Chest: Effort normal and breath sounds decreased without rales but with trace bilat wheeze,  Neurological: Pt is alert. Not confused , motor grossly intact Skin: Skin is warm. No rash, no LE edema Psychiatric: Pt behavior is normal. No agitation.         Assessment & Plan:

## 2015-08-08 NOTE — Assessment & Plan Note (Signed)
Mild to mod, for antibx course,  to f/u any worsening symptoms or concerns 

## 2015-08-08 NOTE — Assessment & Plan Note (Signed)
stable overall by history and exam, recent data reviewed with pt, and pt to continue medical treatment as before,  to f/u any worsening symptoms or concerns Lab Results  Component Value Date   HGBA1C 6.4 04/03/2015   Pt to call for worsening polys or cbg > 200

## 2015-08-08 NOTE — Addendum Note (Signed)
Addended by: Lyman Bishop on: 08/08/2015 06:03 PM   Modules accepted: Orders

## 2015-08-08 NOTE — Progress Notes (Signed)
Pre visit review using our clinic review tool, if applicable. No additional management support is needed unless otherwise documented below in the visit note. 

## 2015-08-08 NOTE — Patient Instructions (Signed)
You had the steroid shot today  Please take all new medication as prescribed - the antibiotic, cough medicine, and prednisone  Please continue all other medications as before, and refills have been done if requested. - the Duonebs  Please have the pharmacy call with any other refills you may need.  Please keep your appointments with your specialists as you may have planned

## 2015-08-20 ENCOUNTER — Telehealth: Payer: Self-pay | Admitting: Gastroenterology

## 2015-08-21 ENCOUNTER — Encounter: Payer: Medicare Other | Admitting: Gastroenterology

## 2015-09-12 ENCOUNTER — Ambulatory Visit: Payer: Medicare Other | Admitting: Internal Medicine

## 2015-09-18 ENCOUNTER — Encounter: Payer: Self-pay | Admitting: Internal Medicine

## 2015-09-18 ENCOUNTER — Ambulatory Visit (INDEPENDENT_AMBULATORY_CARE_PROVIDER_SITE_OTHER): Payer: Medicare Other | Admitting: Internal Medicine

## 2015-09-18 ENCOUNTER — Other Ambulatory Visit (INDEPENDENT_AMBULATORY_CARE_PROVIDER_SITE_OTHER): Payer: Medicare Other

## 2015-09-18 VITALS — BP 108/78 | HR 88 | Temp 97.9°F | Ht 74.0 in | Wt 195.0 lb

## 2015-09-18 DIAGNOSIS — E119 Type 2 diabetes mellitus without complications: Secondary | ICD-10-CM

## 2015-09-18 DIAGNOSIS — Z23 Encounter for immunization: Secondary | ICD-10-CM

## 2015-09-18 DIAGNOSIS — I10 Essential (primary) hypertension: Secondary | ICD-10-CM | POA: Diagnosis not present

## 2015-09-18 DIAGNOSIS — Z0000 Encounter for general adult medical examination without abnormal findings: Secondary | ICD-10-CM

## 2015-09-18 DIAGNOSIS — J441 Chronic obstructive pulmonary disease with (acute) exacerbation: Secondary | ICD-10-CM

## 2015-09-18 DIAGNOSIS — J449 Chronic obstructive pulmonary disease, unspecified: Secondary | ICD-10-CM | POA: Diagnosis not present

## 2015-09-18 DIAGNOSIS — Z0189 Encounter for other specified special examinations: Secondary | ICD-10-CM

## 2015-09-18 LAB — LIPID PANEL
CHOL/HDL RATIO: 2
Cholesterol: 167 mg/dL (ref 0–200)
HDL: 74.1 mg/dL (ref >39.00)
LDL CALC: 73 mg/dL (ref 0–99)
NonHDL: 92.97
TRIGLYCERIDES: 99 mg/dL (ref 0.0–149.0)
VLDL: 19.8 mg/dL (ref 0.0–40.0)

## 2015-09-18 LAB — BASIC METABOLIC PANEL
BUN: 23 mg/dL (ref 6–23)
CO2: 29 mEq/L (ref 19–32)
CREATININE: 1.39 mg/dL (ref 0.40–1.50)
Calcium: 9.7 mg/dL (ref 8.4–10.5)
Chloride: 101 mEq/L (ref 96–112)
GFR: 64.67 mL/min (ref >60.00)
Glucose, Bld: 62 mg/dL — ABNORMAL LOW (ref 70–99)
POTASSIUM: 4 meq/L (ref 3.5–5.1)
Sodium: 137 mEq/L (ref 135–145)

## 2015-09-18 LAB — HEPATIC FUNCTION PANEL
ALBUMIN: 3.9 g/dL (ref 3.5–5.2)
ALT: 9 U/L (ref 0–53)
AST: 12 U/L (ref 0–37)
Alkaline Phosphatase: 66 U/L (ref 39–117)
Bilirubin, Direct: 0 mg/dL (ref 0.0–0.3)
Total Bilirubin: 0.4 mg/dL (ref 0.2–1.2)
Total Protein: 7.3 g/dL (ref 6.0–8.3)

## 2015-09-18 LAB — HEMOGLOBIN A1C: Hgb A1c MFr Bld: 6.3 % (ref 4.6–6.5)

## 2015-09-18 MED ORDER — BENZONATATE 100 MG PO CAPS: ORAL_CAPSULE | ORAL | Status: DC

## 2015-09-18 MED ORDER — PREDNISONE 10 MG PO TABS: ORAL_TABLET | ORAL | Status: DC

## 2015-09-18 MED ORDER — IPRATROPIUM-ALBUTEROL 0.5-2.5 (3) MG/3ML IN SOLN: 3.0000 mL | Freq: Four times a day (QID) | RESPIRATORY_TRACT | Status: AC | PRN

## 2015-09-18 MED ORDER — FORMOTEROL FUMARATE 20 MCG/2ML IN NEBU: 20.0000 ug | INHALATION_SOLUTION | Freq: Two times a day (BID) | RESPIRATORY_TRACT | Status: DC

## 2015-09-18 MED ORDER — ALBUTEROL SULFATE HFA 108 (90 BASE) MCG/ACT IN AERS: INHALATION_SPRAY | RESPIRATORY_TRACT | Status: AC

## 2015-09-18 MED ORDER — BUDESONIDE 0.25 MG/2ML IN SUSP: 0.2500 mg | Freq: Two times a day (BID) | RESPIRATORY_TRACT | Status: DC

## 2015-09-18 MED ORDER — METHYLPREDNISOLONE ACETATE 80 MG/ML IJ SUSP
80.0000 mg | Freq: Once | INTRAMUSCULAR | Status: AC
  Administered 2015-09-18: 80 mg via INTRAMUSCULAR

## 2015-09-18 NOTE — Assessment & Plan Note (Signed)
Mild to mod, for depomedrol, predpac asd, tesss perle for cough, restart neb tx,  to f/u any worsening symptoms or concerns

## 2015-09-18 NOTE — Assessment & Plan Note (Signed)
stable overall by history and exam, recent data reviewed with pt, and pt to continue medical treatment as before,  to f/u any worsening symptoms or concerns BP Readings from Last 3 Encounters:  09/18/15 108/78  08/08/15 134/74  07/25/15 126/80

## 2015-09-18 NOTE — Progress Notes (Signed)
Pre visit review using our clinic review tool, if applicable. No additional management support is needed unless otherwise documented below in the visit note. 

## 2015-09-18 NOTE — Patient Instructions (Addendum)
You had the flu shot today, and the steroid shot today  Please take all new medication as prescribed - the prednisone, and the cough pills (tessalon perle)  Please continue all other medications as before, and refills have been done if requested - the nebyulizers  Please have the pharmacy call with any other refills you may need.  Please continue your efforts at being more active, low cholesterol diabetic diet, and weight control.  Please keep your appointments with your specialists as you may have planned  Please go to the LAB in the Basement (turn left off the elevator) for the tests to be done today  You will be contacted by phone if any changes need to be made immediately.  Otherwise, you will receive a letter about your results with an explanation, but please check with MyChart first.  Please remember to sign up for MyChart if you have not done so, as this will be important to you in the future with finding out test results, communicating by private email, and scheduling acute appointments online when needed.  Please return in 6 months, or sooner if needed, with Lab testing done 3-5 days before

## 2015-09-18 NOTE — Progress Notes (Signed)
Subjective:    Patient ID: Richard Hopkins, male    DOB: 05/21/1946, 69 y.o.   MRN: [id number]  HPI  Here with gradual onset 1 wk worsening prod cough whitish sputum, wheeze and sob, without fever and Pt denies chest pain, orthopnea, PND, increased LE swelling, palpitations, dizziness or syncope. Pt denies new neurological symptoms such as new headache, or facial or extremity weakness or numbness   Pt denies polydipsia, polyuria, or low sugar symptoms such as weakness or confusion improved with po intake.  Pt states overall good compliance with meds, trying to follow lower cholesterol, diabetic diet, wt overall stable but little exercise however.   Has not been taking the nebs due to cost reecent. Past Medical History  Diagnosis Date  . THRUSH   . ADENOMATOUS COLONIC POLYP   . DIABETES MELLITUS, TYPE II   . HYPERLIPIDEMIA   . SMOKER   . BLURRED VISION   . HYPERTENSION   . ALLERGIC RHINITIS   . CHRONIC OBSTRUCTIVE PULMONARY DISEASE, ACUTE EXACERBATION   . COPD   . PULMONARY FIBROSIS   . ESOPHAGEAL STRICTURE   . GERD   . DIVERTICULAR DISEASE   . BENIGN PROSTATIC HYPERTROPHY   . Acute prostatitis   . EPIDIDYMO-ORCHITIS   . SKIN LESION   . LUMBAR DISC DISORDER   . Pain in soft tissues of limb   . MUSCLE CRAMPS   . FATIGUE   . MASS, SUPERFICIAL   . DYSPNEA   . Wheezing   . DYSPNEA/SHORTNESS OF BREATH   . Nocturia   . PSA, INCREASED   . ABNORMAL ELECTROCARDIOGRAM   . ERECTILE DYSFUNCTION, ORGANIC, HX OF   . SYNCOPE   . Brain tumor   . COPD (chronic obstructive pulmonary disease)   . Chronic steroid use    Past Surgical History  Procedure Laterality Date  . Back surgery      x 4 disabled after 2003  . Elbow arthroscopy      reports that he has been smoking Cigarettes.  He has a 50 pack-year smoking history. He has never used smokeless tobacco. He reports that he does not drink alcohol or use illicit drugs. family history includes Asthma in his mother; Diabetes in his  mother; Emphysema in his mother. There is no history of Colon cancer. Allergies  Allergen Reactions  . Doxycycline Nausea Only and Other (See Comments)    Loss of memory; "bad feeling," dizziness  . Ace Inhibitors Cough  . Cardura [Doxazosin Mesylate] Other (See Comments)    dizziness  . Cozaar [Losartan Potassium] Other (See Comments)    dizziness  . Dexilant [Dexlansoprazole] Itching and Rash  . Hydrocodone Nausea Only and Other (See Comments)    dizziness  . Lisinopril Cough  . Norvasc [Amlodipine Besylate] Other (See Comments)    3 times dizzy, bad taste in mouth, none since stopped  . Alfuzosin Rash   Current Outpatient Prescriptions on File Prior to Visit  Medication Sig Dispense Refill  . cloNIDine (CATAPRES) 0.1 MG tablet Take 1 tablet (0.1 mg total) by mouth 3 (three) times daily. 270 tablet 3  . hydrochlorothiazide (HYDRODIURIL) 25 MG tablet Take 25 mg by mouth daily.    Marland Kitchen lovastatin (MEVACOR) 20 MG tablet Take 40 mg by mouth daily at 6 PM.    . meclizine (ANTIVERT) 32 MG tablet Take 1 tablet (32 mg total) by mouth 3 (three) times daily as needed. 30 tablet 11  . metFORMIN (GLUCOPHAGE) 500 MG tablet TAKE ONE  TABLET BY MOUTH TWICE DAILY WITH  A  MEAL 60 tablet 5  . omeprazole (PRILOSEC) 40 MG capsule Take 1 capsule by mouth every afternoon. 30 capsule 6   No current facility-administered medications on file prior to visit.   Review of Systems  Constitutional: Negative for unusual diaphoresis or night sweats HENT: Negative for ringing in ear or discharge Eyes: Negative for double vision or worsening visual disturbance.  Respiratory: Negative for choking and stridor.   Gastrointestinal: Negative for vomiting or other signifcant bowel change Genitourinary: Negative for hematuria or change in urine volume.  Musculoskeletal: Negative for other MSK pain or swelling Skin: Negative for color change and worsening wound.  Neurological: Negative for tremors and numbness other than  noted  Psychiatric/Behavioral: Negative for decreased concentration or agitation other than above       Objective:   Physical Exam BP 108/78 mmHg  Pulse 88  Temp(Src) 97.9 F (36.6 C) (Oral)  Ht 6\' 2"  (1.88 m)  Wt 195 lb (88.451 kg)  BMI 25.03 kg/m2  SpO2 95% VS noted, non toxic Constitutional: Pt appears in no significant distress HENT: Head: NCAT.  Right Ear: External ear normal.  Left Ear: External ear normal.  Eyes: . Pupils are equal, round, and reactive to light. Conjunctivae and EOM are normal Bilat tm's with mild erythema.  Max sinus areas non tender.  Pharynx with mild erythema, no exudate Neck: Normal range of motion. Neck supple.  Cardiovascular: Normal rate and regular rhythm.   Pulmonary/Chest: Effort normal and breath sounds decreased with few bilat wheeze, no rales or rhonchi  Abd:  Soft, NT, ND, + BS Neurological: Pt is alert. Not confused , motor grossly intact Skin: Skin is warm. No rash, no LE edema Psychiatric: Pt behavior is normal. No agitation.     Assessment & Plan:

## 2015-09-18 NOTE — Assessment & Plan Note (Signed)
stable overall by history and exam, recent data reviewed with pt, and pt to continue medical treatment as before,  to f/u any worsening symptoms or concerns Lab Results  Component Value Date   HGBA1C 6.4 04/03/2015   foir f/u labs

## 2015-09-19 ENCOUNTER — Encounter: Payer: Self-pay | Admitting: Internal Medicine

## 2015-09-24 ENCOUNTER — Other Ambulatory Visit: Payer: Self-pay | Admitting: Internal Medicine

## 2015-10-03 ENCOUNTER — Other Ambulatory Visit: Payer: Self-pay | Admitting: Internal Medicine

## 2015-10-04 ENCOUNTER — Ambulatory Visit: Payer: Medicare Other | Admitting: Internal Medicine

## 2015-10-26 ENCOUNTER — Ambulatory Visit: Payer: Medicare Other | Admitting: Family Medicine

## 2015-11-08 ENCOUNTER — Ambulatory Visit (INDEPENDENT_AMBULATORY_CARE_PROVIDER_SITE_OTHER): Payer: Medicare Other | Admitting: Internal Medicine

## 2015-11-08 ENCOUNTER — Encounter: Payer: Self-pay | Admitting: Internal Medicine

## 2015-11-08 VITALS — BP 112/68 | HR 88 | Wt 205.0 lb

## 2015-11-08 DIAGNOSIS — Z7952 Long term (current) use of systemic steroids: Secondary | ICD-10-CM

## 2015-11-08 DIAGNOSIS — J441 Chronic obstructive pulmonary disease with (acute) exacerbation: Secondary | ICD-10-CM | POA: Diagnosis not present

## 2015-11-08 DIAGNOSIS — E119 Type 2 diabetes mellitus without complications: Secondary | ICD-10-CM

## 2015-11-08 DIAGNOSIS — I10 Essential (primary) hypertension: Secondary | ICD-10-CM

## 2015-11-08 MED ORDER — PREDNISONE 10 MG PO TABS: ORAL_TABLET | ORAL | Status: DC

## 2015-11-08 MED ORDER — METHYLPREDNISOLONE ACETATE 80 MG/ML IJ SUSP
80.0000 mg | Freq: Once | INTRAMUSCULAR | Status: AC
  Administered 2015-11-08: 80 mg via INTRAMUSCULAR

## 2015-11-08 MED ORDER — PREDNISONE 5 MG PO TABS: 5.0000 mg | ORAL_TABLET | Freq: Every day | ORAL | Status: DC

## 2015-11-08 NOTE — Progress Notes (Signed)
Pre visit review using our clinic review tool, if applicable. No additional management support is needed unless otherwise documented below in the visit note. 

## 2015-11-08 NOTE — Progress Notes (Signed)
Subjective:    Patient ID: Richard Hopkins, male    DOB: [birth year], 69 y.o.   MRN: [id number]  HPI here with c/o increased daily prod cough, sob, doe, fatigue, malaise, lack of interest in doing anything without fever, CP and Pt denies orthopnea, PND, increased LE swelling, palpitations, dizziness or syncope.  Has not been able to afford steroid inhalations MDI or neb due to cost.  Has new insurance starting jan 1, but at least until then is entreating for prednisone tx.  Has tried to stretch his last rx out until 1 mo ago, but "downhill since then."  Pt denies new neurological symptoms such as new headache, or facial or extremity weakness or numbness   Pt denies polydipsia, polyuria Last CT chest and pulm exam June 2016, no specific f/u planned Past Medical History  Diagnosis Date  . THRUSH   . ADENOMATOUS COLONIC POLYP   . DIABETES MELLITUS, TYPE II   . HYPERLIPIDEMIA   . SMOKER   . BLURRED VISION   . HYPERTENSION   . ALLERGIC RHINITIS   . CHRONIC OBSTRUCTIVE PULMONARY DISEASE, ACUTE EXACERBATION   . COPD   . PULMONARY FIBROSIS   . ESOPHAGEAL STRICTURE   . GERD   . DIVERTICULAR DISEASE   . BENIGN PROSTATIC HYPERTROPHY   . Acute prostatitis   . EPIDIDYMO-ORCHITIS   . SKIN LESION   . LUMBAR DISC DISORDER   . Pain in soft tissues of limb   . MUSCLE CRAMPS   . FATIGUE   . MASS, SUPERFICIAL   . DYSPNEA   . Wheezing   . DYSPNEA/SHORTNESS OF BREATH   . Nocturia   . PSA, INCREASED   . ABNORMAL ELECTROCARDIOGRAM   . ERECTILE DYSFUNCTION, ORGANIC, HX OF   . SYNCOPE   . Brain tumor ([place])   . COPD (chronic obstructive pulmonary disease) ([place])   . Chronic steroid use    Past Surgical History  Procedure Laterality Date  . Back surgery      x 4 disabled after 2003  . Elbow arthroscopy      reports that he has been smoking Cigarettes.  He has a 50 pack-year smoking history. He has never used smokeless tobacco. He reports that he does not drink alcohol or use illicit  drugs. family history includes Asthma in his mother; Diabetes in his mother; Emphysema in his mother. There is no history of Colon cancer. Allergies  Allergen Reactions  . Doxycycline Nausea Only and Other (See Comments)    Loss of memory; "bad feeling," dizziness  . Ace Inhibitors Cough  . Cardura [Doxazosin Mesylate] Other (See Comments)    dizziness  . Cozaar [Losartan Potassium] Other (See Comments)    dizziness  . Dexilant [Dexlansoprazole] Itching and Rash  . Hydrocodone Nausea Only and Other (See Comments)    dizziness  . Lisinopril Cough  . Norvasc [Amlodipine Besylate] Other (See Comments)    3 times dizzy, bad taste in mouth, none since stopped  . Alfuzosin Rash   Current Outpatient Prescriptions on File Prior to Visit  Medication Sig Dispense Refill  . albuterol (PROAIR HFA) 108 (90 BASE) MCG/ACT inhaler 2 puffs every 4 hours as needed only  if your can't catch your breath 1 Inhaler 11  . benzonatate (TESSALON) 100 MG capsule 1-2 tabs by mouth every 8 hrs as needed for cough 60 capsule 1  . budesonide (PULMICORT) 0.25 MG/2ML nebulizer solution Take 2 mLs (0.25 mg total) by nebulization 2 (two) times daily. DX:  J44.9 120 mL 11  . cloNIDine (CATAPRES) 0.1 MG tablet TAKE ONE TABLET BY MOUTH THREE TIMES DAILY 270 tablet 0  . formoterol (PERFOROMIST) 20 MCG/2ML nebulizer solution Take 2 mLs (20 mcg total) by nebulization 2 (two) times daily. DX: J44.9 120 mL 11  . hydrochlorothiazide (HYDRODIURIL) 25 MG tablet Take 25 mg by mouth daily.    Marland Kitchen ipratropium-albuterol (DUONEB) 0.5-2.5 (3) MG/3ML SOLN Take 3 mLs by nebulization every 6 (six) hours as needed (wheezing/shortness of breath). 60 mL 11  . meclizine (ANTIVERT) 32 MG tablet Take 1 tablet (32 mg total) by mouth 3 (three) times daily as needed. 30 tablet 11  . metFORMIN (GLUCOPHAGE) 500 MG tablet TAKE ONE TABLET BY MOUTH TWICE DAILY WITH  A  MEAL 60 tablet 5  . omeprazole (PRILOSEC) 40 MG capsule Take 1 capsule by mouth every  afternoon. 30 capsule 6  . predniSONE (DELTASONE) 10 MG tablet 4tab by mouth x 3day,3tab x 3day,2tab x 3day,1tab x 3 day 30 tablet 0  . lovastatin (MEVACOR) 20 MG tablet Take 40 mg by mouth daily at 6 PM.    . lovastatin (MEVACOR) 20 MG tablet TAKE TWO TABLETS BY MOUTH ONCE DAILY IN THE EVENING (Patient not taking: Reported on 11/08/2015) 180 tablet 1   No current facility-administered medications on file prior to visit.   Review of Systems  Constitutional: Negative for unusual diaphoresis or night sweats HENT: Negative for ringing in ear or discharge Eyes: Negative for double vision or worsening visual disturbance.  Respiratory: Negative for choking and stridor.   Gastrointestinal: Negative for vomiting or other signifcant bowel change Genitourinary: Negative for hematuria or change in urine volume.  Musculoskeletal: Negative for other MSK pain or swelling Skin: Negative for color change and worsening wound.  Neurological: Negative for tremors and numbness other than noted  Psychiatric/Behavioral: Negative for decreased concentration or agitation other than above       Objective:   Physical Exam BP 112/68 mmHg  Pulse 88  Wt 205 lb (92.987 kg)  SpO2 95% VS noted,  Constitutional: Pt appears in no significant distress HENT: Head: NCAT.  Right Ear: External ear normal.  Left Ear: External ear normal.  Eyes: . Pupils are equal, round, and reactive to light. Conjunctivae and EOM are normal Neck: Normal range of motion. Neck supple.  Cardiovascular: Normal rate and regular rhythm.   Pulmonary/Chest: Effort normal and breath sounds decreased without rales but faint insp wheezing noted.  Neurological: Pt is alert. Not confused , motor grossly intact Skin: Skin is warm. No rash, no LE edema Psychiatric: Pt behavior is normal. No agitation.   CT Chest Wo Contrast   Status: Final result       PACS Images     Show images for CT Chest Wo Contrast     Study Result      CLINICAL DATA: Abnormal chest x-ray. Hospitalized in April for pneumonia but still with cough. Initial encounter.  EXAM: CT CHEST WITHOUT CONTRAST  TECHNIQUE: Multidetector CT imaging of the chest was performed following the standard protocol without IV contrast.  COMPARISON: 11/16/2013  FINDINGS: THORACIC INLET/BODY WALL:  Symmetric mild gynecomastia.  MEDIASTINUM:  Normal heart size. There is new, mild low-density pericardial fluid or less likely thickening. Diffuse atherosclerosis, with prominent plaque at the left main coronary artery. No acute vascular findings.  Marked circumferential thickening of the mid esophagus. Patient has history of esophageal stricture and esophagram performed 05/03/2014. By CT, this thickening is stable to increased. No lymphadenopathy.  LUNG WINDOWS:  There is diffuse subpleural reticulation with a slight basilar predominance, where there is also mild sub floor honeycombing. Diffuse bronchial wall thickening with traction bronchiectasis. New thick walled peribronchovascular cystic spaces and nodules best visualized at the bases. No evidence of consolidated pneumonia, edema, effusion. Chronic elevation of the lateral right diaphragm.  UPPER ABDOMEN:  No acute findings.  OSSEOUS:  No acute fracture. No suspicious lytic or blastic lesions.  IMPRESSION: 1. No acute/focal pneumonia. Recent chest x-ray findings was likely related to the patient's chronic lung disease. 2. Interstitial lung disease with fibrosis. There is a usual interstitial pneumonia pattern, although given relative stability from 2014 fibrotic NSIP should also be considered. 3. Numerous new peribronchovascular cysts and nodules, not typical of #2. Patient demographics favors Langerhans cell histiocytosis, but basilar predominance is atypical. Consider 12 month follow-up high-resolution chest CT to evaluate for stability. 4. Severe mid esophageal  thickening in this patient with history of esophageal stricture. Please ensure the patient is followed with endoscopy as esophageal carcinoma can have this exact appearance.   Electronically Signed  By: Monte Fantasia M.D.  On: 05/29/2015 14:55       Assessment & Plan:

## 2015-11-08 NOTE — Patient Instructions (Signed)
You had the steroid shot today  Please take all new medication as prescribed - first the higher dose prednisone taper as you had before, then take the 5 mg daily  Please continue all other medications as before, and refills have been done if requested.  Please have the pharmacy call with any other refills you may need.  Please keep your appointments with your specialists as you may have planned

## 2015-11-10 NOTE — Assessment & Plan Note (Signed)
For prednisone 5 qd, d/w pt risk of long term side effect

## 2015-11-10 NOTE — Assessment & Plan Note (Signed)
stable overall by history and exam, recent data reviewed with pt, and pt to continue medical treatment as before,  to f/u any worsening symptoms or concerns BP Readings from Last 3 Encounters:  11/08/15 112/68  09/18/15 108/78  08/08/15 134/74

## 2015-11-10 NOTE — Assessment & Plan Note (Signed)
Mild to mod, for depomedrol IM, predpac asd, to f/u any worsening symptoms or concerns 

## 2015-11-10 NOTE — Assessment & Plan Note (Signed)
stable overall by history and exam, recent data reviewed with pt, and pt to continue medical treatment as before,  to f/u any worsening symptoms or concerns Lab Results  Component Value Date   HGBA1C 6.3 09/18/2015

## 2016-01-06 ENCOUNTER — Other Ambulatory Visit: Payer: Self-pay | Admitting: Internal Medicine

## 2016-01-07 ENCOUNTER — Encounter: Payer: Self-pay | Admitting: Internal Medicine

## 2016-01-07 ENCOUNTER — Ambulatory Visit (INDEPENDENT_AMBULATORY_CARE_PROVIDER_SITE_OTHER)
Admission: RE | Admit: 2016-01-07 | Discharge: 2016-01-07 | Disposition: A | Discharge status: Home / Self Care | Payer: PPO | Source: Ambulatory Visit | Attending: Internal Medicine | Admitting: Internal Medicine

## 2016-01-07 ENCOUNTER — Other Ambulatory Visit (INDEPENDENT_AMBULATORY_CARE_PROVIDER_SITE_OTHER): Payer: PPO

## 2016-01-07 ENCOUNTER — Ambulatory Visit (INDEPENDENT_AMBULATORY_CARE_PROVIDER_SITE_OTHER): Payer: PPO | Admitting: Internal Medicine

## 2016-01-07 VITALS — BP 130/84 | HR 80 | Temp 97.8°F | Resp 20 | Ht 76.0 in | Wt 214.0 lb

## 2016-01-07 DIAGNOSIS — E119 Type 2 diabetes mellitus without complications: Secondary | ICD-10-CM

## 2016-01-07 DIAGNOSIS — Z0000 Encounter for general adult medical examination without abnormal findings: Secondary | ICD-10-CM

## 2016-01-07 DIAGNOSIS — G939 Disorder of brain, unspecified: Secondary | ICD-10-CM | POA: Diagnosis not present

## 2016-01-07 DIAGNOSIS — R05 Cough: Secondary | ICD-10-CM | POA: Diagnosis not present

## 2016-01-07 DIAGNOSIS — R059 Cough, unspecified: Secondary | ICD-10-CM

## 2016-01-07 DIAGNOSIS — R42 Dizziness and giddiness: Secondary | ICD-10-CM

## 2016-01-07 DIAGNOSIS — N400 Enlarged prostate without lower urinary tract symptoms: Secondary | ICD-10-CM

## 2016-01-07 DIAGNOSIS — H538 Other visual disturbances: Secondary | ICD-10-CM

## 2016-01-07 DIAGNOSIS — I77810 Thoracic aortic ectasia: Secondary | ICD-10-CM | POA: Diagnosis not present

## 2016-01-07 LAB — URINALYSIS, ROUTINE W REFLEX MICROSCOPIC
Bilirubin Urine: NEGATIVE
HGB URINE DIPSTICK: NEGATIVE
KETONES UR: NEGATIVE
Leukocytes, UA: NEGATIVE
NITRITE: NEGATIVE
RBC / HPF: NONE SEEN (ref 0–?)
Specific Gravity, Urine: 1.02 (ref 1.000–1.030)
Total Protein, Urine: NEGATIVE
URINE GLUCOSE: NEGATIVE
UROBILINOGEN UA: 0.2 (ref 0.0–1.0)
pH: 6 (ref 5.0–8.0)

## 2016-01-07 LAB — CBC WITH DIFFERENTIAL/PLATELET
BASOS ABS: 0 10*3/uL (ref 0.0–0.1)
BASOS PCT: 0.3 % (ref 0.0–3.0)
EOS ABS: 0.2 10*3/uL (ref 0.0–0.7)
Eosinophils Relative: 1.6 % (ref 0.0–5.0)
HEMATOCRIT: 45.3 % (ref 39.0–52.0)
HEMOGLOBIN: 14.4 g/dL (ref 13.0–17.0)
LYMPHS PCT: 8.5 % — AB (ref 12.0–46.0)
Lymphs Abs: 0.9 10*3/uL (ref 0.7–4.0)
MCHC: 31.9 g/dL (ref 30.0–36.0)
MCV: 84.9 fl (ref 78.0–100.0)
Monocytes Absolute: 1.1 10*3/uL — ABNORMAL HIGH (ref 0.1–1.0)
Monocytes Relative: 10.9 % (ref 3.0–12.0)
Neutro Abs: 8.1 10*3/uL — ABNORMAL HIGH (ref 1.4–7.7)
Neutrophils Relative %: 78.7 % — ABNORMAL HIGH (ref 43.0–77.0)
Platelets: 297 10*3/uL (ref 150.0–400.0)
RBC: 5.33 Mil/uL (ref 4.22–5.81)
RDW: 15.4 % (ref 11.5–15.5)
WBC: 10.3 10*3/uL (ref 4.0–10.5)

## 2016-01-07 LAB — BASIC METABOLIC PANEL
BUN: 22 mg/dL (ref 6–23)
CHLORIDE: 104 meq/L (ref 96–112)
CO2: 23 meq/L (ref 19–32)
CREATININE: 1.22 mg/dL (ref 0.40–1.50)
Calcium: 9 mg/dL (ref 8.4–10.5)
GFR: 75.11 mL/min (ref >60.00)
Glucose, Bld: 94 mg/dL (ref 70–99)
Potassium: 4.5 mEq/L (ref 3.5–5.1)
SODIUM: 140 meq/L (ref 135–145)

## 2016-01-07 LAB — HEPATIC FUNCTION PANEL
ALK PHOS: 62 U/L (ref 39–117)
ALT: 12 U/L (ref 0–53)
AST: 14 U/L (ref 0–37)
Albumin: 3.8 g/dL (ref 3.5–5.2)
BILIRUBIN DIRECT: 0.1 mg/dL (ref 0.0–0.3)
BILIRUBIN TOTAL: 0.4 mg/dL (ref 0.2–1.2)
TOTAL PROTEIN: 6.9 g/dL (ref 6.0–8.3)

## 2016-01-07 LAB — LIPID PANEL
CHOL/HDL RATIO: 3
Cholesterol: 208 mg/dL — ABNORMAL HIGH (ref 0–200)
HDL: 77.1 mg/dL (ref >39.00)
LDL Cholesterol: 115 mg/dL — ABNORMAL HIGH (ref 0–99)
NONHDL: 130.64
Triglycerides: 80 mg/dL (ref 0.0–149.0)
VLDL: 16 mg/dL (ref 0.0–40.0)

## 2016-01-07 LAB — PSA: PSA: 2.04 ng/mL (ref 0.10–4.00)

## 2016-01-07 LAB — MICROALBUMIN / CREATININE URINE RATIO
CREATININE, U: 138.5 mg/dL
MICROALB UR: 0.7 mg/dL (ref 0.0–1.9)
MICROALB/CREAT RATIO: 0.5 mg/g (ref 0.0–30.0)

## 2016-01-07 LAB — HEMOGLOBIN A1C: HEMOGLOBIN A1C: 6.4 % (ref 4.6–6.5)

## 2016-01-07 LAB — TSH: TSH: 0.52 u[IU]/mL (ref 0.35–4.50)

## 2016-01-07 MED ORDER — METFORMIN HCL 500 MG PO TABS: ORAL_TABLET | ORAL | Status: DC

## 2016-01-07 MED ORDER — TAMSULOSIN HCL 0.4 MG PO CAPS: 0.4000 mg | ORAL_CAPSULE | Freq: Every day | ORAL | Status: DC

## 2016-01-07 MED ORDER — CLONIDINE HCL 0.1 MG PO TABS: 0.1000 mg | ORAL_TABLET | Freq: Three times a day (TID) | ORAL | Status: DC

## 2016-01-07 NOTE — Assessment & Plan Note (Addendum)
ECG reviewed as per emr, etiology unclear, will also check carotid dopplers,  to f/u any worsening symptoms or concerns

## 2016-01-07 NOTE — Patient Instructions (Signed)
Your EKG was OK today  Please take all new medication as prescribed - the flomax for prostate  Please continue all other medications as before, and refills have been done if requested.  Please have the pharmacy call with any other refills you may need.  Please continue your efforts at being more active, low cholesterol diet, and weight control.  You are otherwise up to date with prevention measures today.  Please keep your appointments with your specialists as you may have planned  You will be contacted regarding the referral for: Echocardiogram, carotid dopplers, and Head MRI  Please go to the XRAY Department in the Basement (go straight as you get off the elevator) for the x-ray testing today  - the Chest xray  Please go to the LAB in the Basement (turn left off the elevator) for the tests to be done today  You will be contacted by phone if any changes need to be made immediately.  Otherwise, you will receive a letter about your results with an explanation, but please check with MyChart first.  Please remember to sign up for MyChart if you have not done so, as this will be important to you in the future with finding out test results, communicating by private email, and scheduling acute appointments online when needed.  Please return in 6 months, or sooner if needed, with Lab testing done 3-5 days before  OK to cancel any aother ppt you may have with me

## 2016-01-07 NOTE — Progress Notes (Signed)
Pre visit review using our clinic review tool, if applicable. No additional management support is needed unless otherwise documented below in the visit note. 

## 2016-01-07 NOTE — Progress Notes (Signed)
Subjective:    Patient ID: Richard Hopkins, male    DOB: [birth year], 70 y.o.   MRN: [id number]  HPI  Here for wellness and f/u;  Overall doing ok;  Pt denies Chest pain, worsening SOB, DOE, wheezing, orthopnea, PND, worsening LE edema, palpitations, dizziness or syncope.  Pt denies neurological change such as new headache, facial or extremity weakness.  Pt denies polydipsia, polyuria, or low sugar symptoms. Pt states overall good compliance with treatment and medications, good tolerability, and has been trying to follow appropriate diet.  Pt denies worsening depressive symptoms, suicidal ideation or panic. No fever, night sweats, wt loss, loss of appetite, or other constitutional symptoms.  Pt states good ability with ADL's, has low fall risk, home safety reviewed and adequate, no other significant changes in hearing or vision, and only occasionally active with exercise.  Did have cough acute on chronic mild prod this past wk, but today seems improved to baseline.  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  Also c/o worsening prostatism with decreased flow and straining mild in the past few months. Past Medical History  Diagnosis Date  . THRUSH   . ADENOMATOUS COLONIC POLYP   . DIABETES MELLITUS, TYPE II   . HYPERLIPIDEMIA   . SMOKER   . BLURRED VISION   . HYPERTENSION   . ALLERGIC RHINITIS   . CHRONIC OBSTRUCTIVE PULMONARY DISEASE, ACUTE EXACERBATION   . COPD   . PULMONARY FIBROSIS   . ESOPHAGEAL STRICTURE   . GERD   . DIVERTICULAR DISEASE   . BENIGN PROSTATIC HYPERTROPHY   . Acute prostatitis   . EPIDIDYMO-ORCHITIS   . SKIN LESION   . LUMBAR DISC DISORDER   . Pain in soft tissues of limb   . MUSCLE CRAMPS   . FATIGUE   . MASS, SUPERFICIAL   . DYSPNEA   . Wheezing   . DYSPNEA/SHORTNESS OF BREATH   . Nocturia   . PSA, INCREASED   . ABNORMAL ELECTROCARDIOGRAM    . ERECTILE DYSFUNCTION, ORGANIC, HX OF   . SYNCOPE   . Brain tumor ([place])   . COPD (chronic obstructive pulmonary disease) ([place])   . Chronic steroid use    Past Surgical History  Procedure Laterality Date  . Back surgery      x 4 disabled after 2003  . Elbow arthroscopy      reports that he has been smoking Cigarettes.  He has a 50 pack-year smoking history. He has never used smokeless tobacco. He reports that he does not drink alcohol or use illicit drugs. family history includes Asthma in his mother; Diabetes in his mother; Emphysema in his mother. There is no history of Colon cancer. Allergies  Allergen Reactions  . Doxycycline Nausea Only and Other (See Comments)    Loss of memory; "bad feeling," dizziness  . Ace Inhibitors Cough  . Cardura [Doxazosin Mesylate] Other (See Comments)    dizziness  . Cozaar [Losartan Potassium] Other (See Comments)    dizziness  . Dexilant [Dexlansoprazole] Itching and Rash  . Hydrocodone Nausea Only and Other (See Comments)    dizziness  . Lisinopril Cough  . Norvasc [Amlodipine Besylate] Other (See Comments)    3 times dizzy, bad taste in mouth, none since stopped  . Alfuzosin Rash   Current Outpatient Prescriptions on File Prior to Visit  Medication Sig Dispense Refill  .  albuterol (PROAIR HFA) 108 (90 BASE) MCG/ACT inhaler 2 puffs every 4 hours as needed only  if your can't catch your breath 1 Inhaler 11  . budesonide (PULMICORT) 0.25 MG/2ML nebulizer solution Take 2 mLs (0.25 mg total) by nebulization 2 (two) times daily. DX: J44.9 120 mL 11  . formoterol (PERFOROMIST) 20 MCG/2ML nebulizer solution Take 2 mLs (20 mcg total) by nebulization 2 (two) times daily. DX: J44.9 120 mL 11  . hydrochlorothiazide (HYDRODIURIL) 25 MG tablet Take 25 mg by mouth daily.    Marland Kitchen ipratropium-albuterol (DUONEB) 0.5-2.5 (3) MG/3ML SOLN Take 3 mLs by nebulization every 6 (six) hours as needed (wheezing/shortness of breath). 60 mL 11  . lovastatin (MEVACOR) 20  MG tablet Take 40 mg by mouth daily at 6 PM.    . lovastatin (MEVACOR) 20 MG tablet TAKE TWO TABLETS BY MOUTH ONCE DAILY IN THE EVENING 180 tablet 1  . meclizine (ANTIVERT) 32 MG tablet Take 1 tablet (32 mg total) by mouth 3 (three) times daily as needed. 30 tablet 11  . omeprazole (PRILOSEC) 40 MG capsule Take 1 capsule by mouth every afternoon. 30 capsule 6  . predniSONE (DELTASONE) 5 MG tablet Take 1 tablet (5 mg total) by mouth daily with breakfast. 90 tablet 3  . benzonatate (TESSALON) 100 MG capsule 1-2 tabs by mouth every 8 hrs as needed for cough (Patient not taking: Reported on 01/07/2016) 60 capsule 1   No current facility-administered medications on file prior to visit.   Review of Systems Constitutional: Negative for increased diaphoresis, other activity, appetite or siginficant weight change other than noted HENT: Negative for worsening hearing loss, ear pain, facial swelling, mouth sores and neck stiffness.   Eyes: Negative for other worsening pain, redness or visual disturbance.  Respiratory: Negative for shortness of breath and wheezing  Cardiovascular: Negative for chest pain and palpitations.  Gastrointestinal: Negative for diarrhea, blood in stool, abdominal distention or other pain Genitourinary: Negative for hematuria, flank pain or change in urine volume.  Musculoskeletal: Negative for myalgias or other joint complaints.  Skin: Negative for color change and wound or drainage.  Neurological: Negative for syncope and numbness. other than noted Hematological: Negative for adenopathy. or other swelling Psychiatric/Behavioral: Negative for hallucinations, SI, self-injury, decreased concentration or other worsening agitation.      Objective:   Physical Exam BP 130/84 mmHg  Pulse 80  Temp(Src) 97.8 F (36.6 C) (Oral)  Resp 20  Ht 6\' 4"  (1.93 m)  Wt 214 lb (97.07 kg)  BMI 26.06 kg/m2  SpO2 95% VS noted, non toxic Constitutional: Pt is oriented to person, place, and  time. Appears well-developed and well-nourished, in no significant distress Head: Normocephalic and atraumatic.  Right Ear: External ear normal.  Left Ear: External ear normal.  Nose: Nose normal.  Mouth/Throat: Oropharynx is clear and moist.  Eyes: Conjunctivae and EOM are normal. Pupils are equal, round, and reactive to light.  Neck: Normal range of motion. Neck supple. No JVD present. No tracheal deviation present or significant neck LA or mass Cardiovascular: Normal rate, regular rhythm, normal heart sounds and intact distal pulses.   Pulmonary/Chest: Effort normal and breath sounds decreased without rales or wheezing  Abdominal: Soft. Bowel sounds are normal. NT. No HSM  Musculoskeletal: Normal range of motion. Exhibits no edema.  Lymphadenopathy:  Has no cervical adenopathy.  Neurological: Pt is alert and oriented to person, place, and time. Pt has normal reflexes. No cranial nerve deficit. Motor grossly intact Skin: Skin is warm and  dry. No rash noted.  Psychiatric:  Has normal mood and affect. Behavior is normal.     Assessment & Plan:

## 2016-01-08 DIAGNOSIS — I77810 Thoracic aortic ectasia: Secondary | ICD-10-CM | POA: Insufficient documentation

## 2016-01-08 NOTE — Assessment & Plan Note (Signed)
stable overall by history and exam, recent data reviewed with pt, and pt to continue medical treatment as before,  to f/u any worsening symptoms or concerns Lab Results  Component Value Date   HGBA1C 6.4 01/07/2016

## 2016-01-08 NOTE — Assessment & Plan Note (Signed)
Etiology unclear, transient, should also f/u with opthomology for exam

## 2016-01-08 NOTE — Assessment & Plan Note (Signed)
Also for echo f/u, consider CT, declines card f/u at this time

## 2016-01-08 NOTE — Assessment & Plan Note (Signed)

## 2016-01-08 NOTE — Assessment & Plan Note (Signed)
Overall improved, suspect recent viral illness, for cxr,  to f/u any worsening symptoms or concerns

## 2016-01-08 NOTE — Assessment & Plan Note (Signed)
Mild symptoms, for UA with labs, also trial flomax asd, consider urology referral

## 2016-01-08 NOTE — Assessment & Plan Note (Signed)
Due for f/u, was unable to complete recent prior attempt ordered per NS, will re-order

## 2016-01-11 ENCOUNTER — Ambulatory Visit (HOSPITAL_COMMUNITY): Payer: PPO | Attending: Cardiovascular Disease

## 2016-01-11 ENCOUNTER — Other Ambulatory Visit: Payer: Self-pay

## 2016-01-11 DIAGNOSIS — E785 Hyperlipidemia, unspecified: Secondary | ICD-10-CM | POA: Diagnosis not present

## 2016-01-11 DIAGNOSIS — E119 Type 2 diabetes mellitus without complications: Secondary | ICD-10-CM | POA: Insufficient documentation

## 2016-01-11 DIAGNOSIS — F17200 Nicotine dependence, unspecified, uncomplicated: Secondary | ICD-10-CM | POA: Diagnosis not present

## 2016-01-11 DIAGNOSIS — I517 Cardiomegaly: Secondary | ICD-10-CM | POA: Insufficient documentation

## 2016-01-11 DIAGNOSIS — I10 Essential (primary) hypertension: Secondary | ICD-10-CM | POA: Diagnosis not present

## 2016-01-11 DIAGNOSIS — I77810 Thoracic aortic ectasia: Secondary | ICD-10-CM

## 2016-01-12 ENCOUNTER — Encounter: Payer: Self-pay | Admitting: Internal Medicine

## 2016-01-12 ENCOUNTER — Other Ambulatory Visit: Payer: Self-pay | Admitting: Internal Medicine

## 2016-01-12 DIAGNOSIS — I429 Cardiomyopathy, unspecified: Secondary | ICD-10-CM

## 2016-01-12 HISTORY — DX: Cardiomyopathy, unspecified: I42.9

## 2016-01-15 ENCOUNTER — Encounter: Payer: Self-pay | Admitting: Cardiovascular Disease

## 2016-01-24 NOTE — Progress Notes (Signed)
Patient ID: Richard Hopkins, male   DOB: 1946-01-27, 70 y.o.   MRN: [id number]   69 y.o.  referred by CAD on CT.  No history of CAD.  Smoker.  Chest CT with multiple nodules. Seen by Dr Melvyn Novas.  Discussed smoking cessation and added symbicort. Apparently does not think he has CA.  Improved breathing with inhaler and down to 1/2 ppd using E-cig.  Reviewed CT scan and he actually has lilttle calcium in his cors for age and sex matched males who smoke. One foci is in LM but not likely obstructive. Also has calcification of aortic arch and descending thoracic aorta.  Walks daily with no chest pain. Dyspnea from smoking.  No palpitations or syncope.  Last LDL was good at 71 Type 2 DM    Echo 01/11/16 Study Conclusions  - Left ventricle: There was mild focal basal hypertrophy of the septum. Systolic function was mildly to moderately reduced. The estimated ejection fraction was in the range of 40% to 45%. Probable moderate hypokinesis of the basal-midinferolateral and inferior myocardium. Moderate hypokinesis of the inferoseptal and apical myocardium. Doppler parameters are consistent with abnormal left ventricular relaxation (grade 1 diastolic dysfunction). - Left atrium: The atrium was mildly dilated.  Impressions:  - Compared to 2011, LVEF has deteriorated and there appear to be new wall motion abnormalities in mutiple coronary territories.  No chest pain. Some positional dizziness.  No palpitations or dyspnea.  Needs to quit smoking  ROS: Denies fever, malais, weight loss, blurry vision, decreased visual acuity, cough, sputum, SOB, hemoptysis, pleuritic pain, palpitaitons, heartburn, abdominal pain, melena, lower extremity edema, claudication, or rash.  All other systems reviewed and negative  Seen by primary 01/08/16 with some dizziness and blurred vision.   General: Affect appropriate Healthy:  appears stated age 25: normal Neck supple with no adenopathy JVP normal no  bruits no thyromegaly Lungs clear with no wheezing and good diaphragmatic motion Heart:  S1/S2 no murmur,rub, gallop or click PMI normal Abdomen: benighn, BS positve, no tenderness, no AAA no bruit.  No HSM or HJR Distal pulses intact with no bruits No edema Neuro non-focal Skin warm and dry No muscular weakness  Medications Current Outpatient Prescriptions  Medication Sig Dispense Refill  . albuterol (PROAIR HFA) 108 (90 BASE) MCG/ACT inhaler 2 puffs every 4 hours as needed only  if your can't catch your breath 1 Inhaler 11  . budesonide (PULMICORT) 0.25 MG/2ML nebulizer solution Take 2 mLs (0.25 mg total) by nebulization 2 (two) times daily. DX: J44.9 120 mL 11  . cloNIDine (CATAPRES) 0.1 MG tablet Take 1 tablet (0.1 mg total) by mouth 3 (three) times daily. 270 tablet 1  . formoterol (PERFOROMIST) 20 MCG/2ML nebulizer solution Take 2 mLs (20 mcg total) by nebulization 2 (two) times daily. DX: J44.9 120 mL 11  . hydrochlorothiazide (HYDRODIURIL) 25 MG tablet Take 25 mg by mouth daily.    Marland Kitchen ipratropium-albuterol (DUONEB) 0.5-2.5 (3) MG/3ML SOLN Take 3 mLs by nebulization every 6 (six) hours as needed (wheezing/shortness of breath). 60 mL 11  . lovastatin (MEVACOR) 20 MG tablet Take 40 mg by mouth daily at 6 PM.    . lovastatin (MEVACOR) 20 MG tablet TAKE TWO TABLETS BY MOUTH ONCE DAILY IN THE EVENING 180 tablet 1  . meclizine (ANTIVERT) 32 MG tablet Take 32 mg by mouth 3 (three) times daily as needed for dizziness or nausea.    . metFORMIN (GLUCOPHAGE) 500 MG tablet TAKE ONE TABLET BY MOUTH TWICE DAILY WITH  A  MEAL 180 tablet 3  . omeprazole (PRILOSEC) 40 MG capsule Take 1 capsule by mouth every afternoon. 30 capsule 6  . predniSONE (DELTASONE) 5 MG tablet Take 1 tablet (5 mg total) by mouth daily with breakfast. 90 tablet 3  . tamsulosin (FLOMAX) 0.4 MG CAPS capsule Take 1 capsule (0.4 mg total) by mouth daily. 30 capsule 3   No current facility-administered medications for this  visit.    Allergies Doxycycline; Ace inhibitors; Cardura; Cozaar; Dexilant; Hydrocodone; Lisinopril; Norvasc; and Alfuzosin  Family History: Family History  Problem Relation Age of Onset  . Diabetes Mother   . Colon cancer Neg Hx   . Asthma Mother   . Emphysema Mother     smoker    Social History: Social History   Social History  . Marital Status: Married    Spouse Name: N/A  . Number of Children: 5  . Years of Education: N/A   Occupational History  . disabled    Social History Main Topics  . Smoking status: Current Every Day Smoker -- 1.00 packs/day for 50 years    Types: Cigarettes  . Smokeless tobacco: Never Used  . Alcohol Use: No  . Drug Use: No  . Sexual Activity: Not on file   Other Topics Concern  . 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 New RWMA;s  F/u exercise myovue Lung Nodule: discussed smoking cessation He was an alcoholic and stopped drinking 15 years ago but harder to stop smoking f/U Dr John/[name] ? Chantix DM: Discussed low carb diet.  Target hemoglobin A1c is 6.5 or less.  Continue current medications. Cholesterol:  Cholesterol is at goal.  Continue current dose of statin and diet Rx.  No myalgias or side effects.  F/U  LFT's in 6 months. Lab Results  Component Value Date   LDLCALC 115* 01/07/2016            Dilated Aorta:  4.5 cm on recent echo f/u in a year Dizziness:  Sounds positional doubt arrhythmia observe History of right temporal lobe glioma Needs f/u MRI and referral back to Dr Governor Rooks

## 2016-01-25 ENCOUNTER — Encounter: Payer: Self-pay | Admitting: Cardiovascular Disease

## 2016-01-25 ENCOUNTER — Ambulatory Visit (INDEPENDENT_AMBULATORY_CARE_PROVIDER_SITE_OTHER): Payer: PPO | Admitting: Cardiovascular Disease

## 2016-01-25 VITALS — BP 150/100 | HR 86 | Ht 76.0 in | Wt 215.1 lb

## 2016-01-25 DIAGNOSIS — R931 Abnormal findings on diagnostic imaging of heart and coronary circulation: Secondary | ICD-10-CM

## 2016-01-25 DIAGNOSIS — G939 Disorder of brain, unspecified: Secondary | ICD-10-CM

## 2016-01-25 DIAGNOSIS — I422 Other hypertrophic cardiomyopathy: Secondary | ICD-10-CM

## 2016-01-25 DIAGNOSIS — R55 Syncope and collapse: Secondary | ICD-10-CM

## 2016-01-25 NOTE — Addendum Note (Signed)
Addended by: Aris Georgia, Sharnita Bogucki L on: 01/25/2016 11:33 AM   Modules accepted: Orders

## 2016-01-25 NOTE — Patient Instructions (Signed)
Medication Instructions:  Your physician recommends that you continue on your current medications as directed. Please refer to the Current Medication list given to you today.  Labwork: NONE  Testing/Procedures: Your physician has requested that you have en exercise stress myoview. For further information please visit [email or web address]. Please follow instruction sheet, as given.  Follow-Up: Your physician wants you to follow-up in: 6 months with Dr. Nishan. You will receive a reminder letter in the mail two months in advance. If you don't receive a letter, please call our office to schedule the follow-up appointment.   If you need a refill on your cardiac medications before your next appointment, please call your pharmacy.   

## 2016-02-05 ENCOUNTER — Telehealth (HOSPITAL_COMMUNITY): Payer: Self-pay | Admitting: *Deleted

## 2016-02-05 NOTE — Telephone Encounter (Signed)
Patient given detailed instructions per Myocardial Perfusion Study Information Sheet for the test on 02/07/16 at 1000. Patient notified to arrive 15 minutes early and that it is imperative to arrive on time for appointment to keep from having the test rescheduled.  If you need to cancel or reschedule your appointment, please call the office within 24 hours of your appointment. Failure to do so may result in a cancellation of your appointment, and a $50 no show fee. Patient verbalized understanding.Dairon Procter, Ranae Palms

## 2016-02-07 ENCOUNTER — Ambulatory Visit (HOSPITAL_COMMUNITY): Payer: PPO | Attending: Internal Medicine

## 2016-02-07 DIAGNOSIS — I119 Hypertensive heart disease without heart failure: Secondary | ICD-10-CM | POA: Insufficient documentation

## 2016-02-07 DIAGNOSIS — R55 Syncope and collapse: Secondary | ICD-10-CM

## 2016-02-07 DIAGNOSIS — R9439 Abnormal result of other cardiovascular function study: Secondary | ICD-10-CM | POA: Diagnosis not present

## 2016-02-07 DIAGNOSIS — R0602 Shortness of breath: Secondary | ICD-10-CM | POA: Diagnosis not present

## 2016-02-07 DIAGNOSIS — I422 Other hypertrophic cardiomyopathy: Secondary | ICD-10-CM

## 2016-02-07 DIAGNOSIS — R931 Abnormal findings on diagnostic imaging of heart and coronary circulation: Secondary | ICD-10-CM

## 2016-02-07 LAB — MYOCARDIAL PERFUSION IMAGING
CHL CUP NUCLEAR SDS: 3
CHL CUP RESTING HR STRESS: 62 {beats}/min
LV sys vol: 96 mL
LVDIAVOL: 163 mL
Peak HR: 114 {beats}/min
RATE: 0.26
SRS: 1
SSS: 3
TID: 1.17

## 2016-02-07 MED ORDER — TECHNETIUM TC 99M SESTAMIBI GENERIC - CARDIOLITE
32.8000 | Freq: Once | INTRAVENOUS | Status: AC | PRN
  Administered 2016-02-07: 32.8 via INTRAVENOUS

## 2016-02-07 MED ORDER — TECHNETIUM TC 99M SESTAMIBI GENERIC - CARDIOLITE
10.1000 | Freq: Once | INTRAVENOUS | Status: AC | PRN
  Administered 2016-02-07: 10.1 via INTRAVENOUS

## 2016-02-07 MED ORDER — REGADENOSON 0.4 MG/5ML IV SOLN
0.4000 mg | Freq: Once | INTRAVENOUS | Status: AC
  Administered 2016-02-07: 0.4 mg via INTRAVENOUS

## 2016-02-14 DIAGNOSIS — C719 Malignant neoplasm of brain, unspecified: Secondary | ICD-10-CM | POA: Diagnosis not present

## 2016-02-21 ENCOUNTER — Other Ambulatory Visit: Payer: Self-pay | Admitting: Neurosurgery

## 2016-02-21 DIAGNOSIS — C719 Malignant neoplasm of brain, unspecified: Secondary | ICD-10-CM

## 2016-03-04 ENCOUNTER — Ambulatory Visit
Admission: RE | Admit: 2016-03-04 | Discharge: 2016-03-04 | Disposition: A | Discharge status: Home / Self Care | Payer: PPO | Source: Ambulatory Visit | Attending: Neurosurgery | Admitting: Neurosurgery

## 2016-03-04 DIAGNOSIS — C719 Malignant neoplasm of brain, unspecified: Secondary | ICD-10-CM

## 2016-03-04 MED ORDER — GADOBENATE DIMEGLUMINE 529 MG/ML IV SOLN
20.0000 mL | Freq: Once | INTRAVENOUS | Status: AC | PRN
  Administered 2016-03-04: 20 mL via INTRAVENOUS

## 2016-03-04 NOTE — Progress Notes (Signed)
Patient ID: Richard Hopkins, male   DOB: [birth year], 70 y.o.   MRN: [id number]   70 y.o.  referred by CAD on CT.  No history of CAD.  Smoker.  Chest CT with multiple nodules. Seen by Dr Melvyn Novas.  Discussed smoking cessation and added symbicort. Apparently does not think he has CA.  Improved breathing with inhaler and down to 1/2 ppd using E-cig.  Reviewed CT scan and he actually has lilttle calcium in his cors for age and sex matched males who smoke. One foci is in LM but not likely obstructive. Also has calcification of aortic arch and descending thoracic aorta.  Walks daily with no chest pain. Dyspnea from smoking.  No palpitations or syncope.  Last LDL was good at 71 Type 2 DM    Echo 01/11/16 Study Conclusions  - Left ventricle: There was mild focal basal hypertrophy of the septum. Systolic function was mildly to moderately reduced. The estimated ejection fraction was in the range of 40% to 45%. Probable moderate hypokinesis of the basal-midinferolateral and inferior myocardium. Moderate hypokinesis of the inferoseptal and apical myocardium. Doppler parameters are consistent with abnormal left ventricular relaxation (grade 1 diastolic dysfunction). - Left atrium: The atrium was mildly dilated.  Impressions:  - Compared to 2011, LVEF has deteriorated and there appear to be new wall motion abnormalities in mutiple coronary territories.  No chest pain. Some positional dizziness.  No palpitations or dyspnea.  Needs to quit smoking  02/07/16 abnormal myovue:  Nuclear stress EF: 41%.  There is a small defect of moderate severity present in the basal inferior and mid inferior location. The defect is partially reversible. In the setting of LV dysfunction need to consider scar with peri infarct ischemia although this could also be due to variations in diaphragmatic attenuation.  This is an intermediate risk study.  The left ventricular ejection fraction is moderately decreased  (30-44%).   ROS: Denies fever, malais, weight loss, blurry vision, decreased visual acuity, cough, sputum, SOB, hemoptysis, pleuritic pain, palpitaitons, heartburn, abdominal pain, melena, lower extremity edema, claudication, or rash.  All other systems reviewed and negative  Seen by primary 01/08/16 with some dizziness and blurred vision.   General: Affect appropriate Healthy:  appears stated age 56: normal Neck supple with no adenopathy JVP normal no bruits no thyromegaly Lungs clear with no wheezing and good diaphragmatic motion Heart:  S1/S2 no murmur,rub, gallop or click PMI normal Abdomen: benighn, BS positve, no tenderness, no AAA no bruit.  No HSM or HJR Distal pulses intact with no bruits No edema Neuro non-focal Skin warm and dry No muscular weakness  Medications Current Outpatient Prescriptions  Medication Sig Dispense Refill  . albuterol (PROAIR HFA) 108 (90 BASE) MCG/ACT inhaler 2 puffs every 4 hours as needed only  if your can't catch your breath 1 Inhaler 11  . budesonide (PULMICORT) 0.25 MG/2ML nebulizer solution Take 2 mLs (0.25 mg total) by nebulization 2 (two) times daily. DX: J44.9 120 mL 11  . cloNIDine (CATAPRES) 0.1 MG tablet Take 1 tablet (0.1 mg total) by mouth 3 (three) times daily. 270 tablet 1  . formoterol (PERFOROMIST) 20 MCG/2ML nebulizer solution Take 2 mLs (20 mcg total) by nebulization 2 (two) times daily. DX: J44.9 120 mL 11  . hydrochlorothiazide (HYDRODIURIL) 25 MG tablet Take 25 mg by mouth daily.    Marland Kitchen ipratropium-albuterol (DUONEB) 0.5-2.5 (3) MG/3ML SOLN Take 3 mLs by nebulization every 6 (six) hours as needed (wheezing/shortness of breath). 60 mL 11  .  lovastatin (MEVACOR) 20 MG tablet TAKE TWO TABLETS BY MOUTH ONCE DAILY IN THE EVENING 180 tablet 1  . meclizine (ANTIVERT) 32 MG tablet Take 32 mg by mouth 3 (three) times daily as needed for dizziness or nausea.    . metFORMIN (GLUCOPHAGE) 500 MG tablet TAKE ONE TABLET BY MOUTH TWICE DAILY  WITH  A  MEAL 180 tablet 3  . omeprazole (PRILOSEC) 40 MG capsule Take 1 capsule by mouth every afternoon. 30 capsule 6  . predniSONE (DELTASONE) 5 MG tablet Take 1 tablet (5 mg total) by mouth daily with breakfast. 90 tablet 3  . tamsulosin (FLOMAX) 0.4 MG CAPS capsule Take 1 capsule (0.4 mg total) by mouth daily. 30 capsule 3   No current facility-administered medications for this visit.    Allergies Doxycycline; Ace inhibitors; Cardura; Cozaar; Dexilant; Hydrocodone; Lisinopril; Norvasc; and Alfuzosin  Family History: Family History  Problem Relation Age of Onset  . Diabetes Mother   . Colon cancer Neg Hx   . Asthma Mother   . Emphysema Mother     smoker    Social History: Social History   Social History  . Marital Status: Married    Spouse Name: N/A  . Number of Children: 5  . Years of Education: N/A   Occupational History  . disabled    Social History Main Topics  . Smoking status: Current Every Day Smoker -- 1.00 packs/day for 50 years    Types: Cigarettes  . Smokeless tobacco: Never Used  . Alcohol Use: No  . Drug Use: No  . Sexual Activity: Not on file   Other Topics Concern  . 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 New RWMA;s  F/u exercise myovue abnormal  Inferior abnormalities on both echo and myovue Exertional dyspnea may be  Anginal equivalent Discussed cath with him to define extent of CAD.  Willing to proceed Risk of stroke bleeding and emergency surgery discussed Lab called scheduled for 03/26/16 at 10:00  Lung Nodule: discussed smoking cessation He was an alcoholic and stopped drinking 15 years ago but harder to stop smoking f/U Dr John/[name] ? Chantix DM: Discussed low carb diet.  Target hemoglobin A1c is 6.5  or less.  Continue current medications. Cholesterol:  Cholesterol is at goal.  Continue current dose of statin and diet Rx.  No myalgias or side effects.  F/U  LFT's in 6 months. 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

## 2016-03-05 ENCOUNTER — Encounter: Payer: Self-pay | Admitting: Cardiovascular Disease

## 2016-03-05 ENCOUNTER — Other Ambulatory Visit: Payer: Self-pay | Admitting: Cardiovascular Disease

## 2016-03-05 ENCOUNTER — Ambulatory Visit (INDEPENDENT_AMBULATORY_CARE_PROVIDER_SITE_OTHER): Payer: PPO | Admitting: Cardiovascular Disease

## 2016-03-05 VITALS — BP 140/80 | HR 98 | Ht 76.0 in | Wt 223.0 lb

## 2016-03-05 DIAGNOSIS — Z01812 Encounter for preprocedural laboratory examination: Secondary | ICD-10-CM | POA: Diagnosis not present

## 2016-03-05 DIAGNOSIS — Z09 Encounter for follow-up examination after completed treatment for conditions other than malignant neoplasm: Secondary | ICD-10-CM | POA: Diagnosis not present

## 2016-03-05 NOTE — Patient Instructions (Signed)
Medication Instructions:  Your physician recommends that you continue on your current medications as directed. Please refer to the Current Medication list given to you today.  Labwork: Your physician recommends that you return for lab work on 03/24/16 - BMET, CBC, INR/PT     Testing/Procedures: Your physician has requested that you have a cardiac catheterization. Cardiac catheterization is used to diagnose and/or treat various heart conditions. Doctors may recommend this procedure for a number of different reasons. The most common reason is to evaluate chest pain. Chest pain can be a symptom of coronary artery disease (CAD), and cardiac catheterization can show whether plaque is narrowing or blocking your heart's arteries. This procedure is also used to evaluate the valves, as well as measure the blood flow and oxygen levels in different parts of your heart. For further information please visit [email or web address]. Please follow instruction sheet, as given.  Follow-Up: Your physician wants you to follow-up in: 6 months with Dr. Johnsie Cancel.  You will receive a reminder letter in the mail two months in advance. If you don't receive a letter, please call our office to schedule the follow-up appointment.  If you need a refill on your cardiac medications before your next appointment, please call your pharmacy.

## 2016-03-06 DIAGNOSIS — H04123 Dry eye syndrome of bilateral lacrimal glands: Secondary | ICD-10-CM | POA: Diagnosis not present

## 2016-03-06 DIAGNOSIS — H40033 Anatomical narrow angle, bilateral: Secondary | ICD-10-CM | POA: Diagnosis not present

## 2016-03-18 ENCOUNTER — Ambulatory Visit: Payer: Medicare Other | Admitting: Internal Medicine

## 2016-03-24 ENCOUNTER — Other Ambulatory Visit: Payer: PPO | Admitting: *Deleted

## 2016-03-24 DIAGNOSIS — R748 Abnormal levels of other serum enzymes: Secondary | ICD-10-CM | POA: Diagnosis not present

## 2016-03-24 DIAGNOSIS — Z01812 Encounter for preprocedural laboratory examination: Secondary | ICD-10-CM | POA: Diagnosis not present

## 2016-03-24 DIAGNOSIS — I2510 Atherosclerotic heart disease of native coronary artery without angina pectoris: Secondary | ICD-10-CM | POA: Diagnosis not present

## 2016-03-24 LAB — CBC WITH DIFFERENTIAL/PLATELET
BASOS ABS: 0 {cells}/uL (ref 0–200)
BASOS PCT: 0 %
EOS ABS: 184 {cells}/uL (ref 15–500)
Eosinophils Relative: 2 %
HCT: 44.8 % (ref 38.5–50.0)
HEMOGLOBIN: 15 g/dL (ref 13.2–17.1)
LYMPHS ABS: 1104 {cells}/uL (ref 850–3900)
Lymphocytes Relative: 12 %
MCH: 28.8 pg (ref 27.0–33.0)
MCHC: 33.5 g/dL (ref 32.0–36.0)
MCV: 86 fL (ref 80.0–100.0)
MONOS PCT: 14 %
MPV: 9.8 fL (ref 7.5–12.5)
Monocytes Absolute: 1288 cells/uL — ABNORMAL HIGH (ref 200–950)
NEUTROS ABS: 6624 {cells}/uL (ref 1500–7800)
Neutrophils Relative %: 72 %
Platelets: 267 10*3/uL (ref 140–400)
RBC: 5.21 MIL/uL (ref 4.20–5.80)
RDW: 14.5 % (ref 11.0–15.0)
WBC: 9.2 10*3/uL (ref 3.8–10.8)

## 2016-03-24 LAB — BASIC METABOLIC PANEL
BUN: 25 mg/dL (ref 7–25)
CHLORIDE: 103 mmol/L (ref 98–110)
CO2: 25 mmol/L (ref 20–31)
CREATININE: 1.19 mg/dL — AB (ref 0.70–1.18)
Calcium: 9.2 mg/dL (ref 8.6–10.3)
Glucose, Bld: 101 mg/dL — ABNORMAL HIGH (ref 65–99)
Potassium: 4.4 mmol/L (ref 3.5–5.3)
Sodium: 139 mmol/L (ref 135–146)

## 2016-03-24 LAB — PROTIME-INR
INR: 0.91 (ref <1.50)
Prothrombin Time: 12.4 seconds (ref 11.6–15.2)

## 2016-03-25 ENCOUNTER — Telehealth: Payer: Self-pay

## 2016-03-25 DIAGNOSIS — R748 Abnormal levels of other serum enzymes: Principal | ICD-10-CM

## 2016-03-25 DIAGNOSIS — D72828 Other elevated white blood cell count: Secondary | ICD-10-CM

## 2016-03-25 NOTE — Telephone Encounter (Signed)
Patient aware of lab results. Patient will have repeat CBC on 04/08/16 to recheck monocyte count.

## 2016-03-25 NOTE — Telephone Encounter (Signed)
-----   Message from Josue Hector, MD sent at 03/24/2016  4:21 PM EDT ----- Labs ok for cath will have to recheck monocyte count in 2 weeks

## 2016-03-26 ENCOUNTER — Ambulatory Visit (HOSPITAL_COMMUNITY)
Admission: RE | Admit: 2016-03-26 | Discharge: 2016-03-26 | Disposition: A | Discharge status: Home / Self Care | Payer: PPO | Source: Ambulatory Visit | Attending: Cardiovascular Disease | Admitting: Cardiovascular Disease

## 2016-03-26 ENCOUNTER — Encounter (HOSPITAL_COMMUNITY)
Admission: RE | Disposition: A | Discharge status: Home / Self Care | Payer: Self-pay | Source: Ambulatory Visit | Attending: Cardiovascular Disease

## 2016-03-26 ENCOUNTER — Other Ambulatory Visit: Payer: PPO

## 2016-03-26 DIAGNOSIS — Z7984 Long term (current) use of oral hypoglycemic drugs: Secondary | ICD-10-CM | POA: Insufficient documentation

## 2016-03-26 DIAGNOSIS — I2510 Atherosclerotic heart disease of native coronary artery without angina pectoris: Secondary | ICD-10-CM | POA: Insufficient documentation

## 2016-03-26 DIAGNOSIS — F17210 Nicotine dependence, cigarettes, uncomplicated: Secondary | ICD-10-CM | POA: Diagnosis not present

## 2016-03-26 DIAGNOSIS — E119 Type 2 diabetes mellitus without complications: Secondary | ICD-10-CM | POA: Diagnosis not present

## 2016-03-26 HISTORY — PX: CARDIAC CATHETERIZATION: SHX172

## 2016-03-26 SURGERY — LEFT HEART CATH AND CORONARY ANGIOGRAPHY
Anesthesia: LOCAL

## 2016-03-26 MED ORDER — SODIUM CHLORIDE 0.9 % IV SOLN: 250.0000 mL | INTRAVENOUS | Status: DC | PRN

## 2016-03-26 MED ORDER — ASPIRIN 81 MG PO CHEW: 81.0000 mg | CHEWABLE_TABLET | ORAL | Status: DC

## 2016-03-26 MED ORDER — HEPARIN SODIUM (PORCINE) 1000 UNIT/ML IJ SOLN
INTRAMUSCULAR | Status: DC | PRN
  Administered 2016-03-26: 4000 [IU] via INTRAVENOUS

## 2016-03-26 MED ORDER — HEPARIN (PORCINE) IN NACL 2-0.9 UNIT/ML-% IJ SOLN
INTRAMUSCULAR | Status: DC | PRN
  Administered 2016-03-26: 10 mL via INTRA_ARTERIAL

## 2016-03-26 MED ORDER — SODIUM CHLORIDE 0.9% FLUSH: 3.0000 mL | Freq: Two times a day (BID) | INTRAVENOUS | Status: DC

## 2016-03-26 MED ORDER — SODIUM CHLORIDE 0.9% FLUSH: 3.0000 mL | INTRAVENOUS | Status: DC | PRN

## 2016-03-26 MED ORDER — MIDAZOLAM HCL 2 MG/2ML IJ SOLN
INTRAMUSCULAR | Status: DC | PRN
  Administered 2016-03-26: 2 mg via INTRAVENOUS

## 2016-03-26 MED ORDER — HEPARIN (PORCINE) IN NACL 2-0.9 UNIT/ML-% IJ SOLN
INTRAMUSCULAR | Status: DC | PRN
  Administered 2016-03-26: 1000 mL

## 2016-03-26 MED ORDER — HEPARIN SODIUM (PORCINE) 1000 UNIT/ML IJ SOLN
INTRAMUSCULAR | Status: AC
  Filled 2016-03-26: qty 1

## 2016-03-26 MED ORDER — HEPARIN (PORCINE) IN NACL 2-0.9 UNIT/ML-% IJ SOLN
INTRAMUSCULAR | Status: AC
  Filled 2016-03-26: qty 1000

## 2016-03-26 MED ORDER — LIDOCAINE HCL (PF) 1 % IJ SOLN
INTRAMUSCULAR | Status: AC
  Filled 2016-03-26: qty 30

## 2016-03-26 MED ORDER — SODIUM CHLORIDE 0.9 % WEIGHT BASED INFUSION
3.0000 mL/kg/h | INTRAVENOUS | Status: DC
  Administered 2016-03-26: 3 mL/kg/h via INTRAVENOUS

## 2016-03-26 MED ORDER — LIDOCAINE HCL (PF) 1 % IJ SOLN
INTRAMUSCULAR | Status: DC | PRN
  Administered 2016-03-26: 2 mL via INTRADERMAL

## 2016-03-26 MED ORDER — MIDAZOLAM HCL 2 MG/2ML IJ SOLN
INTRAMUSCULAR | Status: AC
  Filled 2016-03-26: qty 2

## 2016-03-26 MED ORDER — IOPAMIDOL (ISOVUE-370) INJECTION 76%
INTRAVENOUS | Status: DC | PRN
  Administered 2016-03-26: 140 mL via INTRA_ARTERIAL

## 2016-03-26 MED ORDER — IOPAMIDOL (ISOVUE-370) INJECTION 76%
INTRAVENOUS | Status: AC
  Filled 2016-03-26: qty 50

## 2016-03-26 MED ORDER — SODIUM CHLORIDE 0.9 % WEIGHT BASED INFUSION: 1.0000 mL/kg/h | INTRAVENOUS | Status: DC

## 2016-03-26 MED ORDER — IOPAMIDOL (ISOVUE-370) INJECTION 76%
INTRAVENOUS | Status: AC
  Filled 2016-03-26: qty 100

## 2016-03-26 MED ORDER — FENTANYL CITRATE (PF) 100 MCG/2ML IJ SOLN
INTRAMUSCULAR | Status: DC | PRN
  Administered 2016-03-26: 25 ug via INTRAVENOUS

## 2016-03-26 MED ORDER — VERAPAMIL HCL 2.5 MG/ML IV SOLN
INTRAVENOUS | Status: AC
  Filled 2016-03-26: qty 2

## 2016-03-26 MED ORDER — SODIUM CHLORIDE 0.9 % WEIGHT BASED INFUSION: 3.0000 mL/kg/h | INTRAVENOUS | Status: DC

## 2016-03-26 MED ORDER — DIAZEPAM 2 MG PO TABS
2.0000 mg | ORAL_TABLET | ORAL | Status: DC | PRN
  Filled 2016-03-26: qty 1

## 2016-03-26 MED ORDER — FENTANYL CITRATE (PF) 100 MCG/2ML IJ SOLN
INTRAMUSCULAR | Status: AC
  Filled 2016-03-26: qty 2

## 2016-03-26 SURGICAL SUPPLY — 12 items
CATH INFINITI 5FR ANG PIGTAIL (CATHETERS) ×2 IMPLANT
CATH INFINITI 5FR JL4 (CATHETERS) ×2 IMPLANT
CATH INFINITI AR1 MOD (CATHETERS) ×2 IMPLANT
CATH INFINITI JR4 5F (CATHETERS) ×2 IMPLANT
CATH OPTITORQUE TIG 4.0 5F (CATHETERS) ×2 IMPLANT
GLIDESHEATH SLEND SS 6F .021 (SHEATH) ×2 IMPLANT
KIT HEART LEFT (KITS) ×2 IMPLANT
PACK CARDIAC CATHETERIZATION (CUSTOM PROCEDURE TRAY) ×2 IMPLANT
SYR MEDRAD MARK V 150ML (SYRINGE) ×2 IMPLANT
TRANSDUCER W/STOPCOCK (MISCELLANEOUS) ×2 IMPLANT
TUBING CIL FLEX 10 FLL-RA (TUBING) ×2 IMPLANT
WIRE SAFE-T 1.5MM-J .035X260CM (WIRE) ×2 IMPLANT

## 2016-03-26 NOTE — Discharge Instructions (Signed)
Radial Site Care °Refer to this sheet in the next few weeks. These instructions provide you with information about caring for yourself after your procedure. Your health care provider may also give you more specific instructions. Your treatment has been planned according to current medical practices, but problems sometimes occur. Call your health care provider if you have any problems or questions after your procedure. °WHAT TO EXPECT AFTER THE PROCEDURE °After your procedure, it is typical to have the following: °· Bruising at the radial site that usually fades within 1-2 weeks. °· Blood collecting in the tissue (hematoma) that may be painful to the touch. It should usually decrease in size and tenderness within 1-2 weeks. °HOME CARE INSTRUCTIONS °· Take medicines only as directed by your health care provider. °· You may shower 24-48 hours after the procedure or as directed by your health care provider. Remove the bandage (dressing) and gently wash the site with plain soap and water. Pat the area dry with a clean towel. Do not rub the site, because this may cause bleeding. °· Do not take baths, swim, or use a hot tub until your health care provider approves. °· Check your insertion site every day for redness, swelling, or drainage. °· Do not apply powder or lotion to the site. °· Do not flex or bend the affected arm for 24 hours or as directed by your health care provider. °· Do not push or pull heavy objects with the affected arm for 24 hours or as directed by your health care provider. °· Do not lift over 10 lb (4.5 kg) for 5 days after your procedure or as directed by your health care provider. °· Ask your health care provider when it is okay to: °¨ Return to work or school. °¨ Resume usual physical activities or sports. °¨ Resume sexual activity. °· Do not drive home if you are discharged the same day as the procedure. Have someone else drive you. °· You may drive 24 hours after the procedure unless otherwise  instructed by your health care provider. °· Do not operate machinery or power tools for 24 hours after the procedure. °· If your procedure was done as an outpatient procedure, which means that you went home the same day as your procedure, a responsible adult should be with you for the first 24 hours after you arrive home. °· Keep all follow-up visits as directed by your health care provider. This is important. °SEEK MEDICAL CARE IF: °· You have a fever. °· You have chills. °· You have increased bleeding from the radial site. Hold pressure on the site. CALL 911 °SEEK IMMEDIATE MEDICAL CARE IF: °· You have unusual pain at the radial site. °· You have redness, warmth, or swelling at the radial site. °· You have drainage (other than a small amount of blood on the dressing) from the radial site. °· The radial site is bleeding, and the bleeding does not stop after 30 minutes of holding steady pressure on the site. °· Your arm or hand becomes pale, cool, tingly, or numb. °  °This information is not intended to replace advice given to you by your health care provider. Make sure you discuss any questions you have with your health care provider. °  °Document Released: 01/10/2011 Document Revised: 12/29/2014 Document Reviewed: 06/26/2014 °Elsevier Interactive Patient Education ©2016 [employer] ° °

## 2016-03-26 NOTE — Interval H&P Note (Signed)
History and Physical Interval Note:  03/26/2016 2:06 PM  Richard Hopkins  has presented today for surgery, with the diagnosis of cardiomyopathy  The various methods of treatment have been discussed with the patient and family. After consideration of risks, benefits and other options for treatment, the patient has consented to  Procedure(s): Left Heart Cath and Coronary Angiography (N/A) as a surgical intervention .  The patient's history has been reviewed, patient examined, no change in status, stable for surgery.  I have reviewed the patient's chart and labs.  Questions were answered to the patient's satisfaction.     [name]

## 2016-03-26 NOTE — CV Procedure (Signed)
See full note in epic

## 2016-03-26 NOTE — H&P (View-Only) (Signed)
Patient ID: Richard Hopkins, male   DOB: Jul 05, 1946, 70 y.o.   MRN: [id number]   70 y.o.  referred by CAD on CT.  No history of CAD.  Smoker.  Chest CT with multiple nodules. Seen by Dr Melvyn Novas.  Discussed smoking cessation and added symbicort. Apparently does not think he has CA.  Improved breathing with inhaler and down to 1/2 ppd using E-cig.  Reviewed CT scan and he actually has lilttle calcium in his cors for age and sex matched males who smoke. One foci is in LM but not likely obstructive. Also has calcification of aortic arch and descending thoracic aorta.  Walks daily with no chest pain. Dyspnea from smoking.  No palpitations or syncope.  Last LDL was good at 71 Type 2 DM    Echo 01/11/16 Study Conclusions  - Left ventricle: There was mild focal basal hypertrophy of the septum. Systolic function was mildly to moderately reduced. The estimated ejection fraction was in the range of 40% to 45%. Probable moderate hypokinesis of the basal-midinferolateral and inferior myocardium. Moderate hypokinesis of the inferoseptal and apical myocardium. Doppler parameters are consistent with abnormal left ventricular relaxation (grade 1 diastolic dysfunction). - Left atrium: The atrium was mildly dilated.  Impressions:  - Compared to 2011, LVEF has deteriorated and there appear to be new wall motion abnormalities in mutiple coronary territories.  No chest pain. Some positional dizziness.  No palpitations or dyspnea.  Needs to quit smoking  02/07/16 abnormal myovue:  Nuclear stress EF: 41%.  There is a small defect of moderate severity present in the basal inferior and mid inferior location. The defect is partially reversible. In the setting of LV dysfunction need to consider scar with peri infarct ischemia although this could also be due to variations in diaphragmatic attenuation.  This is an intermediate risk study.  The left ventricular ejection fraction is moderately decreased  (30-44%).   ROS: Denies fever, malais, weight loss, blurry vision, decreased visual acuity, cough, sputum, SOB, hemoptysis, pleuritic pain, palpitaitons, heartburn, abdominal pain, melena, lower extremity edema, claudication, or rash.  All other systems reviewed and negative  Seen by primary 01/08/16 with some dizziness and blurred vision.   General: Affect appropriate Healthy:  appears stated age 32: normal Neck supple with no adenopathy JVP normal no bruits no thyromegaly Lungs clear with no wheezing and good diaphragmatic motion Heart:  S1/S2 no murmur,rub, gallop or click PMI normal Abdomen: benighn, BS positve, no tenderness, no AAA no bruit.  No HSM or HJR Distal pulses intact with no bruits No edema Neuro non-focal Skin warm and dry No muscular weakness  Medications Current Outpatient Prescriptions  Medication Sig Dispense Refill  . albuterol (PROAIR HFA) 108 (90 BASE) MCG/ACT inhaler 2 puffs every 4 hours as needed only  if your can't catch your breath 1 Inhaler 11  . budesonide (PULMICORT) 0.25 MG/2ML nebulizer solution Take 2 mLs (0.25 mg total) by nebulization 2 (two) times daily. DX: J44.9 120 mL 11  . cloNIDine (CATAPRES) 0.1 MG tablet Take 1 tablet (0.1 mg total) by mouth 3 (three) times daily. 270 tablet 1  . formoterol (PERFOROMIST) 20 MCG/2ML nebulizer solution Take 2 mLs (20 mcg total) by nebulization 2 (two) times daily. DX: J44.9 120 mL 11  . hydrochlorothiazide (HYDRODIURIL) 25 MG tablet Take 25 mg by mouth daily.    Marland Kitchen ipratropium-albuterol (DUONEB) 0.5-2.5 (3) MG/3ML SOLN Take 3 mLs by nebulization every 6 (six) hours as needed (wheezing/shortness of breath). 60 mL 11  .  lovastatin (MEVACOR) 20 MG tablet TAKE TWO TABLETS BY MOUTH ONCE DAILY IN THE EVENING 180 tablet 1  . meclizine (ANTIVERT) 32 MG tablet Take 32 mg by mouth 3 (three) times daily as needed for dizziness or nausea.    . metFORMIN (GLUCOPHAGE) 500 MG tablet TAKE ONE TABLET BY MOUTH TWICE DAILY  WITH  A  MEAL 180 tablet 3  . omeprazole (PRILOSEC) 40 MG capsule Take 1 capsule by mouth every afternoon. 30 capsule 6  . predniSONE (DELTASONE) 5 MG tablet Take 1 tablet (5 mg total) by mouth daily with breakfast. 90 tablet 3  . tamsulosin (FLOMAX) 0.4 MG CAPS capsule Take 1 capsule (0.4 mg total) by mouth daily. 30 capsule 3   No current facility-administered medications for this visit.    Allergies Doxycycline; Ace inhibitors; Cardura; Cozaar; Dexilant; Hydrocodone; Lisinopril; Norvasc; and Alfuzosin  Family History: Family History  Problem Relation Age of Onset  . Diabetes Mother   . Colon cancer Neg Hx   . Asthma Mother   . Emphysema Mother     smoker    Social History: Social History   Social History  . Marital Status: Married    Spouse Name: N/A  . Number of Children: 5  . Years of Education: N/A   Occupational History  . disabled    Social History Main Topics  . Smoking status: Current Every Day Smoker -- 1.00 packs/day for 50 years    Types: Cigarettes  . Smokeless tobacco: Never Used  . Alcohol Use: No  . Drug Use: No  . Sexual Activity: Not on file   Other Topics Concern  . 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 New RWMA;s  F/u exercise myovue abnormal  Inferior abnormalities on both echo and myovue Exertional dyspnea may be  Anginal equivalent Discussed cath with him to define extent of CAD.  Willing to proceed Risk of stroke bleeding and emergency surgery discussed Lab called scheduled for 03/26/16 at 10:00  Lung Nodule: discussed smoking cessation He was an alcoholic and stopped drinking 15 years ago but harder to stop smoking f/U Dr John/[name] ? Chantix DM: Discussed low carb diet.  Target hemoglobin A1c is 6.5  or less.  Continue current medications. Cholesterol:  Cholesterol is at goal.  Continue current dose of statin and diet Rx.  No myalgias or side effects.  F/U  LFT's in 6 months. 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

## 2016-03-27 ENCOUNTER — Encounter (HOSPITAL_COMMUNITY): Payer: Self-pay | Admitting: Cardiovascular Disease

## 2016-04-08 ENCOUNTER — Other Ambulatory Visit (INDEPENDENT_AMBULATORY_CARE_PROVIDER_SITE_OTHER): Payer: PPO | Admitting: *Deleted

## 2016-04-08 DIAGNOSIS — D72828 Other elevated white blood cell count: Secondary | ICD-10-CM

## 2016-04-08 DIAGNOSIS — R748 Abnormal levels of other serum enzymes: Secondary | ICD-10-CM | POA: Diagnosis not present

## 2016-04-08 LAB — CBC WITH DIFFERENTIAL/PLATELET
BASOS ABS: 0 {cells}/uL (ref 0–200)
Basophils Relative: 0 %
EOS PCT: 3 %
Eosinophils Absolute: 252 cells/uL (ref 15–500)
HEMATOCRIT: 44.8 % (ref 38.5–50.0)
HEMOGLOBIN: 14.6 g/dL (ref 13.2–17.1)
LYMPHS ABS: 1428 {cells}/uL (ref 850–3900)
Lymphocytes Relative: 17 %
MCH: 27.5 pg (ref 27.0–33.0)
MCHC: 32.6 g/dL (ref 32.0–36.0)
MCV: 84.4 fL (ref 80.0–100.0)
MONO ABS: 1176 {cells}/uL — AB (ref 200–950)
MPV: 10.2 fL (ref 7.5–12.5)
Monocytes Relative: 14 %
NEUTROS ABS: 5544 {cells}/uL (ref 1500–7800)
NEUTROS PCT: 66 %
Platelets: 268 10*3/uL (ref 140–400)
RBC: 5.31 MIL/uL (ref 4.20–5.80)
RDW: 14.4 % (ref 11.0–15.0)
WBC: 8.4 10*3/uL (ref 3.8–10.8)

## 2016-04-09 ENCOUNTER — Telehealth: Payer: Self-pay | Admitting: Internal Medicine

## 2016-04-09 DIAGNOSIS — R7989 Other specified abnormal findings of blood chemistry: Secondary | ICD-10-CM

## 2016-04-09 NOTE — Telephone Encounter (Signed)
I suspect the elev monos are allergy related, but cant r/o other  Note WBC is nomal, but elev monos confirmed twice  Will refer to hematology - corinne to let pt know  (he has an very abnormal blood cell that may be allergic related but cant rule out other more significant blood disorder)

## 2016-04-09 NOTE — Telephone Encounter (Signed)
-----   Message from Shellia Cleverly, RN sent at 04/09/2016  2:31 PM EDT ----- Reviewed results with pt.  He is aware of Dr Kyla Balzarine recommendation to forward to PCP for further follow up.

## 2016-04-10 ENCOUNTER — Ambulatory Visit: Payer: Medicare Other | Admitting: Internal Medicine

## 2016-04-25 ENCOUNTER — Other Ambulatory Visit (INDEPENDENT_AMBULATORY_CARE_PROVIDER_SITE_OTHER): Payer: PPO

## 2016-04-25 ENCOUNTER — Ambulatory Visit (INDEPENDENT_AMBULATORY_CARE_PROVIDER_SITE_OTHER): Payer: PPO | Admitting: Internal Medicine

## 2016-04-25 ENCOUNTER — Encounter: Payer: Self-pay | Admitting: Internal Medicine

## 2016-04-25 VITALS — BP 138/78 | HR 97 | Temp 98.0°F | Resp 20 | Wt 219.0 lb

## 2016-04-25 DIAGNOSIS — Z720 Tobacco use: Secondary | ICD-10-CM

## 2016-04-25 DIAGNOSIS — E119 Type 2 diabetes mellitus without complications: Secondary | ICD-10-CM

## 2016-04-25 DIAGNOSIS — J309 Allergic rhinitis, unspecified: Secondary | ICD-10-CM | POA: Diagnosis not present

## 2016-04-25 DIAGNOSIS — D72821 Monocytosis (symptomatic): Secondary | ICD-10-CM | POA: Insufficient documentation

## 2016-04-25 LAB — HEPATIC FUNCTION PANEL
ALBUMIN: 4.1 g/dL (ref 3.5–5.2)
ALK PHOS: 58 U/L (ref 39–117)
ALT: 11 U/L (ref 0–53)
AST: 12 U/L (ref 0–37)
Bilirubin, Direct: 0.1 mg/dL (ref 0.0–0.3)
Total Bilirubin: 0.4 mg/dL (ref 0.2–1.2)
Total Protein: 7.2 g/dL (ref 6.0–8.3)

## 2016-04-25 LAB — CBC WITH DIFFERENTIAL/PLATELET
Basophils Absolute: 0 10*3/uL (ref 0.0–0.1)
Basophils Relative: 0 % (ref 0.0–3.0)
EOS PCT: 1.7 % (ref 0.0–5.0)
Eosinophils Absolute: 0.2 10*3/uL (ref 0.0–0.7)
HCT: 46.2 % (ref 39.0–52.0)
HEMOGLOBIN: 15 g/dL (ref 13.0–17.0)
Lymphocytes Relative: 11.3 % — ABNORMAL LOW (ref 12.0–46.0)
Lymphs Abs: 1.3 10*3/uL (ref 0.7–4.0)
MCHC: 32.5 g/dL (ref 30.0–36.0)
MCV: 84.5 fl (ref 78.0–100.0)
MONOS PCT: 9.3 % (ref 3.0–12.0)
Monocytes Absolute: 1.1 10*3/uL — ABNORMAL HIGH (ref 0.1–1.0)
Neutro Abs: 9.1 10*3/uL — ABNORMAL HIGH (ref 1.4–7.7)
Neutrophils Relative %: 77.7 % — ABNORMAL HIGH (ref 43.0–77.0)
Platelets: 277 10*3/uL (ref 150.0–400.0)
RBC: 5.46 Mil/uL (ref 4.22–5.81)
RDW: 15.2 % (ref 11.5–15.5)
WBC: 11.7 10*3/uL — ABNORMAL HIGH (ref 4.0–10.5)

## 2016-04-25 LAB — BASIC METABOLIC PANEL
BUN: 21 mg/dL (ref 6–23)
CALCIUM: 9.5 mg/dL (ref 8.4–10.5)
CO2: 26 mEq/L (ref 19–32)
Chloride: 103 mEq/L (ref 96–112)
Creatinine, Ser: 1.39 mg/dL (ref 0.40–1.50)
GFR: 64.56 mL/min (ref >60.00)
GLUCOSE: 89 mg/dL (ref 70–99)
Potassium: 4.3 mEq/L (ref 3.5–5.1)
SODIUM: 138 meq/L (ref 135–145)

## 2016-04-25 LAB — LIPID PANEL
CHOLESTEROL: 206 mg/dL — AB (ref 0–200)
HDL: 73.4 mg/dL (ref >39.00)
LDL Cholesterol: 97 mg/dL (ref 0–99)
NONHDL: 132.97
Total CHOL/HDL Ratio: 3
Triglycerides: 178 mg/dL — ABNORMAL HIGH (ref 0.0–149.0)
VLDL: 35.6 mg/dL (ref 0.0–40.0)

## 2016-04-25 LAB — HEMOGLOBIN A1C: HEMOGLOBIN A1C: 6.7 % — AB (ref 4.6–6.5)

## 2016-04-25 MED ORDER — VARENICLINE TARTRATE 0.5 MG X 11 & 1 MG X 42 PO MISC: ORAL | Status: DC

## 2016-04-25 MED ORDER — VARENICLINE TARTRATE 1 MG PO TABS: 1.0000 mg | ORAL_TABLET | Freq: Two times a day (BID) | ORAL | Status: DC

## 2016-04-25 NOTE — Assessment & Plan Note (Addendum)
Large absolute value, suspect underlying bone marrow abormality such as early myelodysplastic syndrome without anemia; pt requests recheck today, will refer hematology/oncology as well  Note:  Total time for pt hx, exam, review of record with pt in the room, determination of diagnoses and plan for further eval and tx is > 40 min, with over 50% spent in coordination and counseling of patient

## 2016-04-25 NOTE — Progress Notes (Signed)
Subjective:    Patient ID: Richard Hopkins, male    DOB: Oct 11, 1946, 70 y.o.   MRN: [id number]  HPI  Here to f/u recent repeated documentation of abnormal large monocytosis per cardiology, no prior hx of same.  Pt denies fever, wt loss, night sweats, loss of appetite, or other constitutional symptoms  Pt denies chest pain, increased sob or doe, wheezing, orthopnea, PND, increased LE swelling, palpitations, dizziness or syncope.  Does have several wks ongoing nasal allergy symptoms with clearish congestion, itch and sneezing, without fever, pain, ST, cough, swelling or wheezing.  Pt denies new neurological symptoms such as new headache, or facial or extremity weakness or numbness  Asks for chantix to quit smoking   Pt denies polydipsia, polyuria,   Past Medical History  Diagnosis Date  . THRUSH   . ADENOMATOUS COLONIC POLYP   . DIABETES MELLITUS, TYPE II   . HYPERLIPIDEMIA   . SMOKER   . BLURRED VISION   . HYPERTENSION   . ALLERGIC RHINITIS   . CHRONIC OBSTRUCTIVE PULMONARY DISEASE, ACUTE EXACERBATION   . COPD   . PULMONARY FIBROSIS   . ESOPHAGEAL STRICTURE   . GERD   . DIVERTICULAR DISEASE   . BENIGN PROSTATIC HYPERTROPHY   . Acute prostatitis   . EPIDIDYMO-ORCHITIS   . SKIN LESION   . LUMBAR DISC DISORDER   . Pain in soft tissues of limb   . MUSCLE CRAMPS   . FATIGUE   . MASS, SUPERFICIAL   . DYSPNEA   . Wheezing   . DYSPNEA/SHORTNESS OF BREATH   . Nocturia   . PSA, INCREASED   . ABNORMAL ELECTROCARDIOGRAM   . ERECTILE DYSFUNCTION, ORGANIC, HX OF   . SYNCOPE   . Brain tumor ([place])   . COPD (chronic obstructive pulmonary disease) ([place])   . Chronic steroid use   . Cardiomyopathy ([place]) 01/12/2016   Past Surgical History  Procedure Laterality Date  . Back surgery      x 4 disabled after 2003  . Elbow arthroscopy    . Cardiac catheterization N/A 03/26/2016    Procedure: Left Heart Cath and Coronary Angiography;  Surgeon: Josue Hector, MD;  Location: [facility];   Service: Cardiovascular;  Laterality: N/A;    reports that he has been smoking Cigarettes.  He has a 50 pack-year smoking history. He has never used smokeless tobacco. He reports that he does not drink alcohol or use illicit drugs. family history includes Asthma in his mother; Diabetes in his mother; Emphysema in his mother. There is no history of Colon cancer. Allergies  Allergen Reactions  . Doxycycline Nausea Only and Other (See Comments)    Loss of memory; "bad feeling," dizziness  . Ace Inhibitors Cough  . Cardura [Doxazosin Mesylate] Other (See Comments)    dizziness  . Cozaar [Losartan Potassium] Other (See Comments)    dizziness  . Dexilant [Dexlansoprazole] Itching and Rash  . Hydrocodone Nausea Only and Other (See Comments)    dizziness  . Lisinopril Cough  . Norvasc [Amlodipine Besylate] Other (See Comments)    3 times dizzy, bad taste in mouth, none since stopped  . Alfuzosin Rash   Current Outpatient Prescriptions on File Prior to Visit  Medication Sig Dispense Refill  . albuterol (PROAIR HFA) 108 (90 BASE) MCG/ACT inhaler 2 puffs every 4 hours as needed only  if your can't catch your breath 1 Inhaler 11  . aspirin 81 MG tablet Take 81 mg by mouth daily.    Marland Kitchen  budesonide (PULMICORT) 0.25 MG/2ML nebulizer solution Take 2 mLs (0.25 mg total) by nebulization 2 (two) times daily. DX: J44.9 120 mL 11  . cloNIDine (CATAPRES) 0.1 MG tablet Take 1 tablet (0.1 mg total) by mouth 3 (three) times daily. 270 tablet 1  . formoterol (PERFOROMIST) 20 MCG/2ML nebulizer solution Take 2 mLs (20 mcg total) by nebulization 2 (two) times daily. DX: J44.9 120 mL 11  . hydrochlorothiazide (HYDRODIURIL) 25 MG tablet Take 25 mg by mouth daily.    Marland Kitchen ipratropium-albuterol (DUONEB) 0.5-2.5 (3) MG/3ML SOLN Take 3 mLs by nebulization every 6 (six) hours as needed (wheezing/shortness of breath). 60 mL 11  . lovastatin (MEVACOR) 20 MG tablet TAKE TWO TABLETS BY MOUTH ONCE DAILY IN THE EVENING 180 tablet 1    . meclizine (ANTIVERT) 32 MG tablet Take 32 mg by mouth 3 (three) times daily as needed for dizziness or nausea.    . metFORMIN (GLUCOPHAGE) 500 MG tablet TAKE ONE TABLET BY MOUTH TWICE DAILY WITH  A  MEAL 180 tablet 3  . omeprazole (PRILOSEC) 40 MG capsule Take 1 capsule by mouth every afternoon. 30 capsule 6  . predniSONE (DELTASONE) 5 MG tablet Take 1 tablet (5 mg total) by mouth daily with breakfast. 90 tablet 3  . tamsulosin (FLOMAX) 0.4 MG CAPS capsule Take 1 capsule (0.4 mg total) by mouth daily. 30 capsule 3   No current facility-administered medications on file prior to visit.   Review of Systems  Constitutional: Negative for unusual diaphoresis or night sweats HENT: Negative for ear swelling or discharge Eyes: Negative for worsening visual haziness  Respiratory: Negative for choking and stridor.   Gastrointestinal: Negative for distension or worsening eructation Genitourinary: Negative for retention or change in urine volume.  Musculoskeletal: Negative for other MSK pain or swelling Skin: Negative for color change and worsening wound Neurological: Negative for tremors and numbness other than noted  Psychiatric/Behavioral: Negative for decreased concentration or agitation other than above       Objective:   Physical Exam BP 138/78 mmHg  Pulse 97  Temp(Src) 98 F (36.7 C) (Oral)  Resp 20  Wt 219 lb (99.338 kg)  SpO2 96% VS noted,  Constitutional: Pt appears in no apparent distress HENT: Head: NCAT.  Right Ear: External ear normal.  Left Ear: External ear normal.  Bilat tm's with mild erythema.  Max sinus areas non tender.  Pharynx with mild erythema, no exudate Eyes: . Pupils are equal, round, and reactive to light. Conjunctivae and EOM are normal Neck: Normal range of motion. Neck supple.  Cardiovascular: Normal rate and regular rhythm.   Pulmonary/Chest: Effort normal and breath sounds without rales or wheezing.  Abd:  Soft, NT, ND, + BS Neurological: Pt is alert.  Not confused , motor grossly intact Skin: Skin is warm. No rash, no LE edema Psychiatric: Pt behavior is normal. No agitation.     Assessment & Plan:

## 2016-04-25 NOTE — Progress Notes (Signed)
Pre visit review using our clinic review tool, if applicable. No additional management support is needed unless otherwise documented below in the visit note. 

## 2016-04-25 NOTE — Patient Instructions (Signed)
You had the steroid shot today  Please take all new medication as prescribed - the chantix  Please continue all other medications as before, and refills have been done if requested.  Please have the pharmacy call with any other refills you may need.  Please continue your efforts at being more active, low cholesterol diet, and weight control.  Please keep your appointments with your specialists as you may have planned  You will be contacted regarding the referral for: Hematology/Oncology  Please go to the LAB in the Basement (turn left off the elevator) for the tests to be done today  You will be contacted by phone if any changes need to be made immediately.  Otherwise, you will receive a letter about your results with an explanation, but please check with MyChart first.  Please remember to sign up for MyChart if you have not done so, as this will be important to you in the future with finding out test results, communicating by private email, and scheduling acute appointments online when needed.  Please return in 6 months, or sooner if needed

## 2016-04-26 ENCOUNTER — Encounter: Payer: Self-pay | Admitting: Internal Medicine

## 2016-04-26 NOTE — Assessment & Plan Note (Signed)
stable overall by history and exam, recent data reviewed with pt, and pt to continue medical treatment as before,  to f/u any worsening symptoms or concerns Lab Results  Component Value Date   HGBA1C 6.7* 04/25/2016   For f/u lab

## 2016-04-26 NOTE — Assessment & Plan Note (Signed)
Mild to mod seasonal flare, for depomedrol IM,  to f/u any worsening symptoms or concerns

## 2016-04-26 NOTE — Assessment & Plan Note (Signed)
Todd for chantix trial,  to f/u any worsening symptoms or concerns

## 2016-05-07 ENCOUNTER — Telehealth: Payer: Self-pay | Admitting: Oncology

## 2016-05-07 NOTE — Telephone Encounter (Signed)
Lt mess regarding new pt referral

## 2016-05-12 ENCOUNTER — Encounter: Payer: Self-pay | Admitting: Hematology and Oncology

## 2016-05-12 ENCOUNTER — Telehealth: Payer: Self-pay | Admitting: Hematology and Oncology

## 2016-05-12 NOTE — Telephone Encounter (Signed)
Dr. Gwynn Burly office called to obtain appointment and will contact pt with appt date/time, mailed calendar out to pt.

## 2016-05-21 ENCOUNTER — Ambulatory Visit (HOSPITAL_BASED_OUTPATIENT_CLINIC_OR_DEPARTMENT_OTHER): Payer: PPO

## 2016-05-21 ENCOUNTER — Ambulatory Visit (HOSPITAL_BASED_OUTPATIENT_CLINIC_OR_DEPARTMENT_OTHER): Payer: PPO | Admitting: Hematology and Oncology

## 2016-05-21 VITALS — BP 150/85 | HR 67 | Temp 98.7°F | Resp 20 | Ht 75.0 in | Wt 218.0 lb

## 2016-05-21 DIAGNOSIS — D72821 Monocytosis (symptomatic): Secondary | ICD-10-CM

## 2016-05-21 LAB — CBC WITH DIFFERENTIAL/PLATELET
BASO%: 0.5 % (ref 0.0–2.0)
Basophils Absolute: 0 10*3/uL (ref 0.0–0.1)
EOS ABS: 0.2 10*3/uL (ref 0.0–0.5)
EOS%: 2.3 % (ref 0.0–7.0)
HCT: 44.5 % (ref 38.4–49.9)
HEMOGLOBIN: 14.1 g/dL (ref 13.0–17.1)
LYMPH#: 0.7 10*3/uL — AB (ref 0.9–3.3)
LYMPH%: 9 % — AB (ref 14.0–49.0)
MCH: 27.2 pg (ref 27.2–33.4)
MCHC: 31.7 g/dL — ABNORMAL LOW (ref 32.0–36.0)
MCV: 85.8 fL (ref 79.3–98.0)
MONO#: 0.8 10*3/uL (ref 0.1–0.9)
MONO%: 11.1 % (ref 0.0–14.0)
NEUT%: 77.1 % — ABNORMAL HIGH (ref 39.0–75.0)
NEUTROS ABS: 5.7 10*3/uL (ref 1.5–6.5)
PLATELETS: 238 10*3/uL (ref 140–400)
RBC: 5.19 10*6/uL (ref 4.20–5.82)
RDW: 14.7 % — AB (ref 11.0–14.6)
WBC: 7.4 10*3/uL (ref 4.0–10.3)

## 2016-05-21 NOTE — Assessment & Plan Note (Signed)
Monocytosis since last 1 year with absolute monocyte count 01.1 to 1.2. I discussed with him the function of monocytes in the cause of increase in monocyte count. Differential diagnosis includes infections, inflammations, autoimmune diseases, bone marrow disorders. Patient does not have any obvious signs or symptoms of infection. Patient does have profound fatigue.  Recommendation: Recheck blood work with CBC with differential Abdomen flow cytometry and FISH panel for BCR-EBL Check ANA for lupus. I will call the patient with the results of these tests. If they are relatively normal, he could be seen on an as-needed basis. There is any abnormality we may have to pursue a bone marrow biopsy that time.   

## 2016-05-21 NOTE — Progress Notes (Signed)
[facility] NOTE  Patient Care Team: Biagio Borg, MD as PCP - General  CHIEF COMPLAINTS/PURPOSE OF CONSULTATION:  Monocytosis  HISTORY OF PRESENTING ILLNESS:  Richard Hopkins 70 y.o. male is here because of recent diagnosis of monocytosis. Patient complained of fatigue and had extensive blood work which revealed elevation of monocytes. Normal monocyte count is between 0.3-1 his count was 1.1 and 1.2 in the last 6 months. Apart from fatigue as noted complaints. He does not have any palpable lymphadenopathy. He denies any fevers or chills or any signs and symptoms of recent infection or illnesses.  I reviewed her records extensively and collaborated the history with the patient.  MEDICAL HISTORY:  Past Medical History  Diagnosis Date  . THRUSH   . ADENOMATOUS COLONIC POLYP   . DIABETES MELLITUS, TYPE II   . HYPERLIPIDEMIA   . SMOKER   . BLURRED VISION   . HYPERTENSION   . ALLERGIC RHINITIS   . CHRONIC OBSTRUCTIVE PULMONARY DISEASE, ACUTE EXACERBATION   . COPD   . PULMONARY FIBROSIS   . ESOPHAGEAL STRICTURE   . GERD   . DIVERTICULAR DISEASE   . BENIGN PROSTATIC HYPERTROPHY   . Acute prostatitis   . EPIDIDYMO-ORCHITIS   . SKIN LESION   . LUMBAR DISC DISORDER   . Pain in soft tissues of limb   . MUSCLE CRAMPS   . FATIGUE   . MASS, SUPERFICIAL   . DYSPNEA   . Wheezing   . DYSPNEA/SHORTNESS OF BREATH   . Nocturia   . PSA, INCREASED   . ABNORMAL ELECTROCARDIOGRAM   . ERECTILE DYSFUNCTION, ORGANIC, HX OF   . SYNCOPE   . Brain tumor ([place])   . COPD (chronic obstructive pulmonary disease) ([place])   . Chronic steroid use   . Cardiomyopathy ([place]) 01/12/2016    SURGICAL HISTORY: Past Surgical History  Procedure Laterality Date  . Back surgery      x 4 disabled after 2003  . Elbow arthroscopy    . Cardiac catheterization N/A 03/26/2016    Procedure: Left Heart Cath and Coronary Angiography;  Surgeon: Josue Hector, MD;  Location: [facility];   Service: Cardiovascular;  Laterality: N/A;    SOCIAL HISTORY: Social History   Social History  . Marital Status: Married    Spouse Name: N/A  . Number of Children: 5  . Years of Education: N/A   Occupational History  . disabled    Social History Main Topics  . Smoking status: Current Every Day Smoker -- 1.00 packs/day for 50 years    Types: Cigarettes  . Smokeless tobacco: Never Used  . Alcohol Use: No  . Drug Use: No  . Sexual Activity: Not on file   Other Topics Concern  . 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 Diabetes Mother   . Colon cancer Neg Hx   . Asthma Mother   . Emphysema Mother     smoker    ALLERGIES:  is allergic to doxycycline; ace inhibitors; cardura; cozaar; dexilant; hydrocodone; lisinopril; norvasc; and alfuzosin.  MEDICATIONS:  Current Outpatient Prescriptions  Medication Sig Dispense Refill  . albuterol (PROAIR HFA) 108 (90 BASE) MCG/ACT inhaler 2 puffs every 4 hours as needed  only  if your can't catch your breath 1 Inhaler 11  . aspirin 81 MG tablet Take 81 mg by mouth daily.    . budesonide (PULMICORT) 0.25 MG/2ML nebulizer solution Take 2 mLs (0.25 mg total) by nebulization 2 (two) times daily. DX: J44.9 120 mL 11  . cloNIDine (CATAPRES) 0.1 MG tablet Take 1 tablet (0.1 mg total) by mouth 3 (three) times daily. 270 tablet 1  . formoterol (PERFOROMIST) 20 MCG/2ML nebulizer solution Take 2 mLs (20 mcg total) by nebulization 2 (two) times daily. DX: J44.9 120 mL 11  . hydrochlorothiazide (HYDRODIURIL) 25 MG tablet Take 25 mg by mouth daily.    Marland Kitchen ipratropium-albuterol (DUONEB) 0.5-2.5 (3) MG/3ML SOLN Take 3 mLs by nebulization every 6 (six) hours as needed (wheezing/shortness of breath). 60 mL 11  . lovastatin (MEVACOR) 20 MG tablet  TAKE TWO TABLETS BY MOUTH ONCE DAILY IN THE EVENING 180 tablet 1  . meclizine (ANTIVERT) 32 MG tablet Take 32 mg by mouth 3 (three) times daily as needed for dizziness or nausea.    . metFORMIN (GLUCOPHAGE) 500 MG tablet TAKE ONE TABLET BY MOUTH TWICE DAILY WITH  A  MEAL 180 tablet 3  . omeprazole (PRILOSEC) 40 MG capsule Take 1 capsule by mouth every afternoon. 30 capsule 6  . predniSONE (DELTASONE) 5 MG tablet Take 1 tablet (5 mg total) by mouth daily with breakfast. 90 tablet 3  . tamsulosin (FLOMAX) 0.4 MG CAPS capsule Take 1 capsule (0.4 mg total) by mouth daily. 30 capsule 3  . varenicline (CHANTIX CONTINUING MONTH PAK) 1 MG tablet Take 1 tablet (1 mg total) by mouth 2 (two) times daily. 60 tablet 1  . varenicline (CHANTIX STARTING MONTH PAK) 0.5 MG X 11 & 1 MG X 42 tablet Take one 0.5 mg tablet by mouth once daily for 3 days, then increase to one 0.5 mg tablet twice daily for 4 days, then increase to one 1 mg tablet twice daily. 53 tablet 0   No current facility-administered medications for this visit.    REVIEW OF SYSTEMS:   Constitutional: Denies fevers, chills or abnormal night sweats Eyes: Denies blurriness of vision, double vision or watery eyes Ears, nose, mouth, throat, and face: Denies mucositis or sore throat Respiratory: Denies cough, dyspnea or wheezes Cardiovascular: Denies palpitation, chest discomfort or lower extremity swelling Gastrointestinal:  Denies nausea, heartburn or change in bowel habits Skin: Denies abnormal skin rashes Lymphatics: Denies new lymphadenopathy or easy bruising Neurological:Denies numbness, tingling or new weaknesses Behavioral/Psych: Mood is stable, no new changes   All other systems were reviewed with the patient and are negative.  PHYSICAL EXAMINATION: ECOG PERFORMANCE STATUS: 1 - Symptomatic but completely ambulatory  Filed Vitals:   05/21/16 1233  BP: 150/85  Pulse: 67  Temp: 98.7 F (37.1 C)  Resp: 20   Filed Weights   05/21/16  1233  Weight: 218 lb (98.884 kg)    GENERAL:alert, no distress and comfortable SKIN: skin color, texture, turgor are normal, no rashes or significant lesions EYES: normal, conjunctiva are pink and non-injected, sclera clear OROPHARYNX:no exudate, no erythema and lips, buccal mucosa, and tongue normal  NECK: supple, thyroid normal size, non-tender, without nodularity LYMPH:  no palpable lymphadenopathy in the cervical, axillary or inguinal LUNGS: clear to auscultation and percussion with normal breathing effort HEART: regular rate & rhythm and no murmurs and no lower extremity edema ABDOMEN:abdomen soft, non-tender and normal bowel sounds Musculoskeletal:no cyanosis of digits and no clubbing  PSYCH: alert & oriented  x 3 with fluent speech NEURO: no focal motor/sensory deficits  LABORATORY DATA:  I have reviewed the data as listed Lab Results  Component Value Date   WBC 7.4 05/21/2016   HGB 14.1 05/21/2016   HCT 44.5 05/21/2016   MCV 85.8 05/21/2016   PLT 238 05/21/2016   Lab Results  Component Value Date   NA 138 04/25/2016   K 4.3 04/25/2016   CL 103 04/25/2016   CO2 26 04/25/2016   ASSESSMENT AND PLAN:  Monocytosis Monocytosis since last 1 year with absolute monocyte count 01.1 to 1.2. I discussed with him the function of monocytes in the cause of increase in monocyte count. Differential diagnosis includes infections, inflammations, autoimmune diseases, bone marrow disorders. Patient does not have any obvious signs or symptoms of infection. Patient does have profound fatigue.  Recommendation: Recheck blood work with CBC with differential Abdomen flow cytometry and FISH panel for BCR-EBL Check ANA for lupus. I will call the patient with the results of these tests. If they are relatively normal, he could be seen on an as-needed basis. If   If there is any abnormality, we may have to pursue a bone marrow biopsy that time.  All questions were answered. The patient knows to  call the clinic with any problems, questions or concerns.    Rulon Eisenmenger, MD 05/21/2016

## 2016-05-23 LAB — ANTINUCLEAR ANTIBODIES, IFA
ANTINUCLEAR ANTIBODIES, IFA: POSITIVE — AB
Homogeneous Pattern: 1:80 {titer}

## 2016-05-23 LAB — FLOW CYTOMETRY

## 2016-07-08 ENCOUNTER — Ambulatory Visit: Payer: PPO | Admitting: Internal Medicine

## 2016-07-16 ENCOUNTER — Other Ambulatory Visit: Payer: Self-pay | Admitting: Internal Medicine

## 2016-07-25 ENCOUNTER — Telehealth: Payer: Self-pay | Admitting: Hematology and Oncology

## 2016-07-25 ENCOUNTER — Ambulatory Visit (HOSPITAL_BASED_OUTPATIENT_CLINIC_OR_DEPARTMENT_OTHER): Payer: PPO | Admitting: Hematology and Oncology

## 2016-07-25 ENCOUNTER — Telehealth: Payer: Self-pay | Admitting: *Deleted

## 2016-07-25 ENCOUNTER — Other Ambulatory Visit (HOSPITAL_COMMUNITY)
Admission: RE | Admit: 2016-07-25 | Discharge: 2016-07-25 | Disposition: A | Discharge status: Home / Self Care | Payer: PPO | Source: Other Acute Inpatient Hospital | Attending: Hematology and Oncology | Admitting: Hematology and Oncology

## 2016-07-25 ENCOUNTER — Encounter: Payer: Self-pay | Admitting: Hematology and Oncology

## 2016-07-25 ENCOUNTER — Ambulatory Visit (HOSPITAL_BASED_OUTPATIENT_CLINIC_OR_DEPARTMENT_OTHER): Payer: PPO

## 2016-07-25 VITALS — BP 139/81 | HR 92 | Temp 98.2°F | Resp 18 | Ht 75.0 in | Wt 215.2 lb

## 2016-07-25 DIAGNOSIS — D68318 Other hemorrhagic disorder due to intrinsic circulating anticoagulants, antibodies, or inhibitors: Secondary | ICD-10-CM

## 2016-07-25 DIAGNOSIS — D72821 Monocytosis (symptomatic): Secondary | ICD-10-CM

## 2016-07-25 DIAGNOSIS — R233 Spontaneous ecchymoses: Secondary | ICD-10-CM

## 2016-07-25 DIAGNOSIS — I629 Nontraumatic intracranial hemorrhage, unspecified: Secondary | ICD-10-CM | POA: Diagnosis not present

## 2016-07-25 LAB — COMPREHENSIVE METABOLIC PANEL
ALT: 11 U/L (ref 0–55)
ANION GAP: 10 meq/L (ref 3–11)
AST: 12 U/L (ref 5–34)
Albumin: 3.3 g/dL — ABNORMAL LOW (ref 3.5–5.0)
Alkaline Phosphatase: 59 U/L (ref 40–150)
BUN: 16.6 mg/dL (ref 7.0–26.0)
CALCIUM: 9 mg/dL (ref 8.4–10.4)
CHLORIDE: 107 meq/L (ref 98–109)
CO2: 23 meq/L (ref 22–29)
CREATININE: 1.3 mg/dL (ref 0.7–1.3)
EGFR: 66 mL/min/{1.73_m2} — ABNORMAL LOW (ref >90)
Glucose: 96 mg/dl (ref 70–140)
POTASSIUM: 4.1 meq/L (ref 3.5–5.1)
Sodium: 140 mEq/L (ref 136–145)
Total Bilirubin: 0.64 mg/dL (ref 0.20–1.20)
Total Protein: 6.6 g/dL (ref 6.4–8.3)

## 2016-07-25 LAB — CBC & DIFF AND RETIC
BASO%: 0.3 % (ref 0.0–2.0)
Basophils Absolute: 0 10*3/uL (ref 0.0–0.1)
EOS%: 3.9 % (ref 0.0–7.0)
Eosinophils Absolute: 0.3 10*3/uL (ref 0.0–0.5)
HEMATOCRIT: 35.2 % — AB (ref 38.4–49.9)
HGB: 11.2 g/dL — ABNORMAL LOW (ref 13.0–17.1)
Immature Retic Fract: 18.6 % — ABNORMAL HIGH (ref 3.00–10.60)
LYMPH#: 1 10*3/uL (ref 0.9–3.3)
LYMPH%: 11.4 % — ABNORMAL LOW (ref 14.0–49.0)
MCH: 27.5 pg (ref 27.2–33.4)
MCHC: 31.8 g/dL — AB (ref 32.0–36.0)
MCV: 86.3 fL (ref 79.3–98.0)
MONO#: 1 10*3/uL — ABNORMAL HIGH (ref 0.1–0.9)
MONO%: 11.9 % (ref 0.0–14.0)
NEUT#: 6.3 10*3/uL (ref 1.5–6.5)
NEUT%: 72.5 % (ref 39.0–75.0)
PLATELETS: 257 10*3/uL (ref 140–400)
RBC: 4.08 10*6/uL — AB (ref 4.20–5.82)
RDW: 14.5 % (ref 11.0–14.6)
RETIC %: 1.88 % — AB (ref 0.80–1.80)
RETIC CT ABS: 76.7 10*3/uL (ref 34.80–93.90)
WBC: 8.7 10*3/uL (ref 4.0–10.3)

## 2016-07-25 LAB — PLATELET FUNCTION ASSAY: COLLAGEN / EPINEPHRINE: 161 s (ref 0–193)

## 2016-07-25 LAB — LACTATE DEHYDROGENASE: LDH: 311 U/L — AB (ref 125–245)

## 2016-07-25 MED ORDER — PREDNISONE 20 MG PO TABS: 20.0000 mg | ORAL_TABLET | Freq: Every day | ORAL | 1 refills | Status: DC

## 2016-07-25 NOTE — Telephone Encounter (Signed)
per pof to sch pt appt-gave pt copy of avs/cal °

## 2016-07-25 NOTE — Telephone Encounter (Signed)
Per staff message and POF I have scheduled appts. Advised scheduler of appts. JMW  

## 2016-07-25 NOTE — Assessment & Plan Note (Signed)
Monocytosis since 2016 Patient does not have any obvious signs or symptoms of infection. Patient does have profound fatigue.  Workup: Recheck blood work with CBC with differential revealed normal monocyte count of 0.8 on 05/21/2016 Rest of the CBC showed normal white blood cell count normal hemoglobin and normal platelets BCR-ABL: Negative ANA: Positive Titer 1:80 Homogeneous pattern: Patient will need to see rheumatology to evaluate for lupus.  I reviewed the above-mentioned blood work with the patient and did not recommend a bone marrow biopsy this time. We are happy to see the patient in the future if necessary.

## 2016-07-25 NOTE — Progress Notes (Signed)
Patient Care Team: Biagio Borg, MD as PCP - General  DIAGNOSIS: Factor VIII inhibitor  CHIEF COMPLIANT: Extensive bruising and hemorrhage into the thigh and left leg  INTERVAL HISTORY: Richard Hopkins is a 70 year old with above-mentioned history of factor VIII inhibitor who had profound bleeding episode in 03/02/2015and was treated with prednisone and rituximab. This had led to resolution of his symptoms. He came in today for an urgent visit because he once again started having hemorrhage into his left thigh extending from the groin all the way down to the ankle. It's a very extensive hemorrhage. It is not associated with any pain or discomfort. There is no concern for compartment syndrome.   REVIEW OF SYSTEMS:   Constitutional: Denies fevers, chills or abnormal weight loss Eyes: Denies blurriness of vision Ears, nose, mouth, throat, and face: Denies mucositis or sore throat Respiratory: Denies cough, dyspnea or wheezes Cardiovascular: Denies palpitation, chest discomfort Gastrointestinal:  Denies nausea, heartburn or change in bowel habits Skin: Denies abnormal skin rashes Lymphatics: Denies new lymphadenopathy or easy bruising Neurological:Denies numbness, tingling or new weaknesses Behavioral/Psych: Mood is stable, no new changes  Extremities: Left leg bruise extending from the groin to the ankle  All other systems were reviewed with the patient and are negative.  I have reviewed the past medical history, past surgical history, social history and family history with the patient and they are unchanged from previous note.  ALLERGIES:  is allergic to doxycycline; ace inhibitors; cardura [doxazosin mesylate]; cozaar [losartan potassium]; dexilant [dexlansoprazole]; hydrocodone; lisinopril; norvasc [amlodipine besylate]; and alfuzosin.  MEDICATIONS:  Current Outpatient Prescriptions  Medication Sig Dispense Refill  . albuterol (PROAIR HFA) 108 (90 BASE) MCG/ACT inhaler 2 puffs  every 4 hours as needed only  if your can't catch your breath 1 Inhaler 11  . aspirin 81 MG tablet Take 81 mg by mouth daily.    . budesonide (PULMICORT) 0.25 MG/2ML nebulizer solution Take 2 mLs (0.25 mg total) by nebulization 2 (two) times daily. DX: J44.9 120 mL 11  . cloNIDine (CATAPRES) 0.1 MG tablet Take 1 tablet (0.1 mg total) by mouth 3 (three) times daily. 270 tablet 1  . formoterol (PERFOROMIST) 20 MCG/2ML nebulizer solution Take 2 mLs (20 mcg total) by nebulization 2 (two) times daily. DX: J44.9 120 mL 11  . hydrochlorothiazide (HYDRODIURIL) 25 MG tablet Take 25 mg by mouth daily.    Marland Kitchen ipratropium-albuterol (DUONEB) 0.5-2.5 (3) MG/3ML SOLN Take 3 mLs by nebulization every 6 (six) hours as needed (wheezing/shortness of breath). 60 mL 11  . lovastatin (MEVACOR) 20 MG tablet TAKE TWO TABLETS BY MOUTH ONCE DAILY IN THE EVENING 180 tablet 1  . meclizine (ANTIVERT) 32 MG tablet Take 32 mg by mouth 3 (three) times daily as needed for dizziness or nausea.    . metFORMIN (GLUCOPHAGE) 500 MG tablet TAKE ONE TABLET BY MOUTH TWICE DAILY WITH  A  MEAL 180 tablet 3  . omeprazole (PRILOSEC) 40 MG capsule Take 1 capsule by mouth every afternoon. 30 capsule 6  . predniSONE (DELTASONE) 20 MG tablet Take 1 tablet (20 mg total) by mouth daily with breakfast. 60 tablet 1  . predniSONE (DELTASONE) 5 MG tablet TAKE ONE TABLET BY MOUTH  DAILY WITH BREAKFAST 90 tablet 0  . tamsulosin (FLOMAX) 0.4 MG CAPS capsule Take 1 capsule (0.4 mg total) by mouth daily. 30 capsule 3  . varenicline (CHANTIX CONTINUING MONTH PAK) 1 MG tablet Take 1 tablet (1 mg total) by mouth 2 (two) times daily.  60 tablet 1  . varenicline (CHANTIX STARTING MONTH PAK) 0.5 MG X 11 & 1 MG X 42 tablet Take one 0.5 mg tablet by mouth once daily for 3 days, then increase to one 0.5 mg tablet twice daily for 4 days, then increase to one 1 mg tablet twice daily. 53 tablet 0   No current facility-administered medications for this visit.      PHYSICAL EXAMINATION: ECOG PERFORMANCE STATUS: 1 - Symptomatic but completely ambulatory  Vitals:   07/25/16 0931  BP: 139/81  Pulse: 92  Resp: 18  Temp: 98.2 F (36.8 C)   Filed Weights   07/25/16 0931  Weight: 215 lb 3.2 oz (97.6 kg)    GENERAL:alert, no distress and comfortable SKIN: skin color, texture, turgor are normal, no rashes or significant lesions EYES: normal, Conjunctiva are pink and non-injected, sclera clear OROPHARYNX:no exudate, no erythema and lips, buccal mucosa, and tongue normal  NECK: supple, thyroid normal size, non-tender, without nodularity LYMPH:  no palpable lymphadenopathy in the cervical, axillary or inguinal LUNGS: clear to auscultation and percussion with normal breathing effort HEART: regular rate & rhythm and no murmurs and no lower extremity edema ABDOMEN:abdomen soft, non-tender and normal bowel sounds MUSCULOSKELETAL: Left leg bruise extensive from his groin to the ankle  NEURO: alert & oriented x 3 with fluent speech, no focal motor/sensory deficits EXTREMITIES: No lower extremity edema  LABORATORY DATA:  I have reviewed the data as listed   Chemistry      Component Value Date/Time   NA 138 04/25/2016 1711   NA 141 03/27/2014 1011   K 4.3 04/25/2016 1711   K 4.0 03/27/2014 1011   CL 103 04/25/2016 1711   CO2 26 04/25/2016 1711   CO2 25 03/27/2014 1011   BUN 21 04/25/2016 1711   BUN 14.7 03/27/2014 1011   CREATININE 1.39 04/25/2016 1711   CREATININE 1.19 (H) 03/24/2016 1127   CREATININE 1.1 03/27/2014 1011      Component Value Date/Time   CALCIUM 9.5 04/25/2016 1711   CALCIUM 9.5 03/27/2014 1011   ALKPHOS 58 04/25/2016 1711   AST 12 04/25/2016 1711   ALT 11 04/25/2016 1711   BILITOT 0.4 04/25/2016 1711       Lab Results  Component Value Date   WBC 8.7 07/25/2016   HGB 11.2 (L) 07/25/2016   HCT 35.2 (L) 07/25/2016   MCV 86.3 07/25/2016   PLT 257 07/25/2016   NEUTROABS 6.3 07/25/2016     ASSESSMENT & PLAN:   Monocytosis Monocytosis since 2016 Patient did not have any obvious signs or symptoms of infection. Patient does have profound fatigue.  Workup: CBC with differential revealed normal monocyte count of 0.8 on 05/21/2016 Rest of the CBC showed normal white blood cell count normal hemoglobin and normal platelets BCR-ABL: Negative ANA: Positive Titer 1:80 Homogeneous pattern: Patient may need to see rheumatology to evaluate for lupus.  I reviewed the above-mentioned blood work with the patient and did not recommend a bone marrow biopsy this time.   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 He was then treated with prednisone and rituximab. This led to complete resolution of his symptoms.  Patient came in on urgent visit because he had profound left thigh bruising and hemorrhage extending from the groin all the way down to his ankle. This started on Monday. His got progressively worse. It is not associated with any pain or discomfort.  Recommendation: 1.  Recheck factor VIII inhibitor levels 2. Recheck factor VIII and von Willebrand factor levels 3. PT/PTT INR 4. Platelet function assay 5. CBC and CMP  Plan: 1. Start prednisone 40 mg daily for a week then taper it down slowly over the next 4-6 weeks 2. Rituximab next Thursday weekly 4 3. Hepatitis B and C testing  Return to clinic with cycle 2 of rituximab    Orders Placed This Encounter  Procedures  . CBC & Diff and Retic    Standing Status:   Future    Number of Occurrences:   1    Standing Expiration Date:   07/25/2017  . Comprehensive metabolic panel    Standing Status:   Future    Number of Occurrences:   1    Standing Expiration Date:   07/25/2017  . Lactate dehydrogenase (LDH)    Standing Status:   Future    Number of Occurrences:   1    Standing Expiration Date:   07/25/2017  . Factor 8 assay    Standing Status:   Future     Number of Occurrences:   1    Standing Expiration Date:   07/25/2017  . Factor 8 Inhibitor    Standing Status:   Future    Number of Occurrences:   1    Standing Expiration Date:   07/25/2017  . Von Willebrand Factor Multimer    Standing Status:   Future    Number of Occurrences:   1    Standing Expiration Date:   08/29/2017  . Von Willebrand panel    Standing Status:   Future    Number of Occurrences:   1    Standing Expiration Date:   07/25/2017  . Protime-INR    Standing Status:   Standing    Number of Occurrences:   1  . APTT    Standing Status:   Standing    Number of Occurrences:   1  . Platelet function assay    Standing Status:   Future    Number of Occurrences:   1    Standing Expiration Date:   07/25/2017  . Hepatitis B surface antigen    Standing Status:   Future    Number of Occurrences:   1    Standing Expiration Date:   07/25/2017  . Hepatitis C antibody    Standing Status:   Future    Number of Occurrences:   1    Standing Expiration Date:   07/25/2017  . PHYSICIAN COMMUNICATION ORDER    Hepatitis B Virus screening with HBsAg and anti-HBc recommended prior to treatment with rituximab (Rituxan), ofatumumab (Arzerra) or obinutuzumab Dyann Kief).   The patient has a good understanding of the overall plan. he agrees with it. he will call with any problems that may develop before the next visit here.   Rulon Eisenmenger, MD 07/25/16

## 2016-07-25 NOTE — Assessment & Plan Note (Signed)
Factor VIII inhibitor previously causing profound hemorrhage into the skin and subcutaneous tissues in 2016. He was then treated with prednisone and rituximab. This led to complete resolution of his symptoms.  Patient came in on urgent visit because he had profound left thigh bruising and hemorrhage extending from the groin all the way down to his ankle. This started on Monday. His got progressively worse. It is not associated with any pain or discomfort.  Recommendation: 1. Recheck factor VIII inhibitor levels 2. Recheck factor VIII and von Willebrand factor levels 3. PT/PTT INR 4. Platelet function assay 5. CBC and CMP  Plan: 1. Start prednisone 40 mg daily for a week then taper it down slowly over the next 4-6 weeks 2. Rituximab next Thursday weekly 4 3. Hepatitis B and C testing  Return to clinic with cycle 2 of rituximab

## 2016-07-26 LAB — HEPATITIS B SURFACE ANTIGEN: HEP B S AG: NEGATIVE

## 2016-07-26 LAB — HEPATITIS C ANTIBODY

## 2016-07-28 LAB — VON WILLEBRAND PANEL
PDF Image: 0
vWF Activity: 204 % — ABNORMAL HIGH (ref 50–200)
von Willebrand Factor (vWF) Ag: 293 % — ABNORMAL HIGH (ref 50–200)

## 2016-07-30 ENCOUNTER — Other Ambulatory Visit: Payer: Self-pay | Admitting: Pharmacist

## 2016-07-31 LAB — FACTOR 8 INHIBITOR
APTT 1 1 NORMAL PLASMA: 32.1 s — AB (ref 22.9–30.2)
FACTOR VIII BETHESDA TITER: 11.2 [beth'U] — AB (ref 0.0–0.8)
aPTT 1:1 Mix Saline: 94.6 s
aPTT 1:1 NP Mix, 60 Min,Incub.: 38.6 s — ABNORMAL HIGH (ref 22.9–30.2)
aPTT: 55.3 s — ABNORMAL HIGH (ref 22.9–30.2)

## 2016-07-31 LAB — VON WILLEBRAND PANEL
PDF Image: 0
VON WILLEBRAND AG: 293 % — AB (ref 50–200)
VON WILLEBRAND FACTOR: 204 % — AB (ref 50–200)

## 2016-07-31 LAB — HEPATITIS C ANTIBODY: Hep C Virus Ab: <0.1 s/co ratio (ref 0.0–0.9)

## 2016-07-31 LAB — HEPATITIS B SURFACE ANTIGEN: HBsAg Screen: NEGATIVE

## 2016-08-01 ENCOUNTER — Ambulatory Visit (HOSPITAL_BASED_OUTPATIENT_CLINIC_OR_DEPARTMENT_OTHER): Payer: PPO

## 2016-08-01 ENCOUNTER — Other Ambulatory Visit: Payer: PPO

## 2016-08-01 VITALS — BP 145/80 | HR 74 | Temp 98.6°F | Resp 16

## 2016-08-01 DIAGNOSIS — D68318 Other hemorrhagic disorder due to intrinsic circulating anticoagulants, antibodies, or inhibitors: Secondary | ICD-10-CM | POA: Diagnosis not present

## 2016-08-01 DIAGNOSIS — Z5112 Encounter for antineoplastic immunotherapy: Secondary | ICD-10-CM | POA: Diagnosis not present

## 2016-08-01 DIAGNOSIS — M79605 Pain in left leg: Secondary | ICD-10-CM

## 2016-08-01 MED ORDER — IBUPROFEN 200 MG PO TABS
400.0000 mg | ORAL_TABLET | Freq: Once | ORAL | Status: DC
Start: 2016-08-01 — End: 2016-08-15

## 2016-08-01 MED ORDER — ACETAMINOPHEN 325 MG PO TABS
650.0000 mg | ORAL_TABLET | Freq: Once | ORAL | Status: AC
  Administered 2016-08-01: 650 mg via ORAL

## 2016-08-01 MED ORDER — DIPHENHYDRAMINE HCL 25 MG PO CAPS
50.0000 mg | ORAL_CAPSULE | Freq: Once | ORAL | Status: AC
  Administered 2016-08-01: 50 mg via ORAL

## 2016-08-01 MED ORDER — DIPHENHYDRAMINE HCL 25 MG PO CAPS
ORAL_CAPSULE | ORAL | Status: AC
  Filled 2016-08-01: qty 2

## 2016-08-01 MED ORDER — SODIUM CHLORIDE 0.9 % IV SOLN
375.0000 mg/m2 | Freq: Once | INTRAVENOUS | Status: AC
  Administered 2016-08-01: 900 mg via INTRAVENOUS
  Filled 2016-08-01: qty 50

## 2016-08-01 MED ORDER — ACETAMINOPHEN 325 MG PO TABS
ORAL_TABLET | ORAL | Status: AC
  Filled 2016-08-01: qty 2

## 2016-08-01 MED ORDER — SODIUM CHLORIDE 0.9 % IV SOLN
Freq: Once | INTRAVENOUS | Status: AC
  Administered 2016-08-01: 10:00:00 via INTRAVENOUS

## 2016-08-01 NOTE — Progress Notes (Signed)
Okay to use lab values from 07/25/16, per Dr.Gudena.

## 2016-08-01 NOTE — Patient Instructions (Addendum)
[facility] Discharge Instructions for Patients Receiving Chemotherapy  Today you received the following chemotherapy agents Rituxan To help prevent nausea and vomiting after your treatment, we encourage you to take your nausea medication   If you develop nausea and vomiting that is not controlled by your nausea medication, call the clinic.   BELOW ARE SYMPTOMS THAT SHOULD BE REPORTED IMMEDIATELY:  *FEVER GREATER THAN 100.5 F  *CHILLS WITH OR WITHOUT FEVER  NAUSEA AND VOMITING THAT IS NOT CONTROLLED WITH YOUR NAUSEA MEDICATION  *UNUSUAL SHORTNESS OF BREATH  *UNUSUAL BRUISING OR BLEEDING  TENDERNESS IN MOUTH AND THROAT WITH OR WITHOUT PRESENCE OF ULCERS  *URINARY PROBLEMS  *BOWEL PROBLEMS  UNUSUAL RASH Items with * indicate a potential emergency and should be followed up as soon as possible.  Feel free to call the clinic you have any questions or concerns. The clinic phone number is [phone].  Please show the [facility] at check-in to the Emergency Department and triage nurse.  Rituximab injection What is this medicine? RITUXIMAB (ri TUX i mab) is a monoclonal antibody. It is used commonly to treat non-Hodgkin lymphoma and other conditions. It is also used to treat rheumatoid arthritis (RA). In RA, this medicine slows the inflammatory process and help reduce joint pain and swelling. This medicine is often used with other cancer or arthritis medications. This medicine may be used for other purposes; ask your health care provider or pharmacist if you have questions. What should I tell my health care provider before I take this medicine? They need to know if you have any of these conditions: -blood disorders -heart disease -history of hepatitis B -infection (especially a virus infection such as chickenpox, cold sores, or herpes) -irregular heartbeat -kidney disease -lung or breathing disease, like asthma -lupus -an unusual or allergic reaction to  rituximab, mouse proteins, other medicines, foods, dyes, or preservatives -pregnant or trying to get pregnant -breast-feeding How should I use this medicine? This medicine is for infusion into a vein. It is administered in a hospital or clinic by a specially trained health care professional. A special MedGuide will be given to you by the pharmacist with each prescription and refill. Be sure to read this information carefully each time. Talk to your pediatrician regarding the use of this medicine in children. This medicine is not approved for use in children. Overdosage: If you think you have taken too much of this medicine contact a poison control center or emergency room at once. NOTE: This medicine is only for you. Do not share this medicine with others. What if I miss a dose? It is important not to miss a dose. Call your doctor or health care professional if you are unable to keep an appointment. What may interact with this medicine? -cisplatin -medicines for blood pressure -some other medicines for arthritis -vaccines This list may not describe all possible interactions. Give your health care provider a list of all the medicines, herbs, non-prescription drugs, or dietary supplements you use. Also tell them if you smoke, drink alcohol, or use illegal drugs. Some items may interact with your medicine. What should I watch for while using this medicine? Report any side effects that you notice during your treatment right away, such as changes in your breathing, fever, chills, dizziness or lightheadedness. These effects are more common with the first dose. Visit your prescriber or health care professional for checks on your progress. You will need to have regular blood work. Report any other side effects.  The side effects of this medicine can continue after you finish your treatment. Continue your course of treatment even though you feel ill unless your doctor tells you to stop. Call your doctor or  health care professional for advice if you get a fever, chills or sore throat, or other symptoms of a cold or flu. Do not treat yourself. This drug decreases your body's ability to fight infections. Try to avoid being around people who are sick. This medicine may increase your risk to bruise or bleed. Call your doctor or health care professional if you notice any unusual bleeding. Be careful brushing and flossing your teeth or using a toothpick because you may get an infection or bleed more easily. If you have any dental work done, tell your dentist you are receiving this medicine. Avoid taking products that contain aspirin, acetaminophen, ibuprofen, naproxen, or ketoprofen unless instructed by your doctor. These medicines may hide a fever. Do not become pregnant while taking this medicine. Women should inform their doctor if they wish to become pregnant or think they might be pregnant. There is a potential for serious side effects to an unborn child. Talk to your health care professional or pharmacist for more information. Do not breast-feed an infant while taking this medicine. What side effects may I notice from receiving this medicine? Side effects that you should report to your doctor or health care professional as soon as possible: -allergic reactions like skin rash, itching or hives, swelling of the face, lips, or tongue -low blood counts - this medicine may decrease the number of white blood cells, red blood cells and platelets. You may be at increased risk for infections and bleeding. -signs of infection - fever or chills, cough, sore throat, pain or difficulty passing urine -signs of decreased platelets or bleeding - bruising, pinpoint red spots on the skin, black, tarry stools, blood in the urine -signs of decreased red blood cells - unusually weak or tired, fainting spells, lightheadedness -breathing problems -confused, not responsive -chest pain -fast, irregular heartbeat -feeling faint  or lightheaded, falls -mouth sores -redness, blistering, peeling or loosening of the skin, including inside the mouth -stomach pain -swelling of the ankles, feet, or hands -trouble passing urine or change in the amount of urine Side effects that usually do not require medical attention (report to your doctor or other health care professional if they continue or are bothersome): -anxiety -headache -loss of appetite -muscle aches -nausea -night sweats This list may not describe all possible side effects. Call your doctor for medical advice about side effects. You may report side effects to FDA at 1-800-FDA-1088. Where should I keep my medicine? This drug is given in a hospital or clinic and will not be stored at home. NOTE: This sheet is a summary. It may not cover all possible information. If you have questions about this medicine, talk to your doctor, pharmacist, or health care provider.    2016, [employer]. (2015-02-14 22:30:56)

## 2016-08-04 LAB — VON WILLEBRAND FACTOR MULTIMER

## 2016-08-05 ENCOUNTER — Ambulatory Visit (HOSPITAL_BASED_OUTPATIENT_CLINIC_OR_DEPARTMENT_OTHER)
Admission: RE | Admit: 2016-08-05 | Discharge: 2016-08-05 | Disposition: A | Discharge status: Home / Self Care | Payer: PPO | Source: Ambulatory Visit | Attending: Hematology and Oncology | Admitting: Hematology and Oncology

## 2016-08-05 ENCOUNTER — Telehealth: Payer: Self-pay | Admitting: *Deleted

## 2016-08-05 ENCOUNTER — Ambulatory Visit (HOSPITAL_BASED_OUTPATIENT_CLINIC_OR_DEPARTMENT_OTHER): Payer: PPO | Admitting: Hematology and Oncology

## 2016-08-05 ENCOUNTER — Encounter: Payer: Self-pay | Admitting: Hematology and Oncology

## 2016-08-05 VITALS — BP 146/73 | HR 97 | Temp 98.1°F | Resp 18 | Wt 226.5 lb

## 2016-08-05 DIAGNOSIS — T148 Other injury of unspecified body region: Secondary | ICD-10-CM | POA: Diagnosis not present

## 2016-08-05 DIAGNOSIS — N401 Enlarged prostate with lower urinary tract symptoms: Secondary | ICD-10-CM | POA: Diagnosis not present

## 2016-08-05 DIAGNOSIS — Z825 Family history of asthma and other chronic lower respiratory diseases: Secondary | ICD-10-CM | POA: Diagnosis not present

## 2016-08-05 DIAGNOSIS — J8410 Pulmonary fibrosis, unspecified: Secondary | ICD-10-CM | POA: Diagnosis not present

## 2016-08-05 DIAGNOSIS — D496 Neoplasm of unspecified behavior of brain: Secondary | ICD-10-CM

## 2016-08-05 DIAGNOSIS — Z881 Allergy status to other antibiotic agents status: Secondary | ICD-10-CM | POA: Diagnosis not present

## 2016-08-05 DIAGNOSIS — D638 Anemia in other chronic diseases classified elsewhere: Secondary | ICD-10-CM | POA: Diagnosis not present

## 2016-08-05 DIAGNOSIS — R269 Unspecified abnormalities of gait and mobility: Secondary | ICD-10-CM | POA: Diagnosis not present

## 2016-08-05 DIAGNOSIS — M79605 Pain in left leg: Secondary | ICD-10-CM

## 2016-08-05 DIAGNOSIS — M7989 Other specified soft tissue disorders: Secondary | ICD-10-CM | POA: Insufficient documentation

## 2016-08-05 DIAGNOSIS — N182 Chronic kidney disease, stage 2 (mild): Secondary | ICD-10-CM | POA: Diagnosis not present

## 2016-08-05 DIAGNOSIS — J449 Chronic obstructive pulmonary disease, unspecified: Secondary | ICD-10-CM | POA: Diagnosis not present

## 2016-08-05 DIAGNOSIS — D689 Coagulation defect, unspecified: Secondary | ICD-10-CM | POA: Insufficient documentation

## 2016-08-05 DIAGNOSIS — D631 Anemia in chronic kidney disease: Secondary | ICD-10-CM | POA: Diagnosis not present

## 2016-08-05 DIAGNOSIS — R233 Spontaneous ecchymoses: Secondary | ICD-10-CM | POA: Diagnosis not present

## 2016-08-05 DIAGNOSIS — M79662 Pain in left lower leg: Secondary | ICD-10-CM | POA: Diagnosis not present

## 2016-08-05 DIAGNOSIS — I5042 Chronic combined systolic (congestive) and diastolic (congestive) heart failure: Secondary | ICD-10-CM | POA: Diagnosis not present

## 2016-08-05 DIAGNOSIS — D682 Hereditary deficiency of other clotting factors: Secondary | ICD-10-CM | POA: Diagnosis not present

## 2016-08-05 DIAGNOSIS — Z7984 Long term (current) use of oral hypoglycemic drugs: Secondary | ICD-10-CM | POA: Diagnosis not present

## 2016-08-05 DIAGNOSIS — R351 Nocturia: Secondary | ICD-10-CM | POA: Diagnosis not present

## 2016-08-05 DIAGNOSIS — Z7982 Long term (current) use of aspirin: Secondary | ICD-10-CM | POA: Diagnosis not present

## 2016-08-05 DIAGNOSIS — Z888 Allergy status to other drugs, medicaments and biological substances status: Secondary | ICD-10-CM | POA: Diagnosis not present

## 2016-08-05 DIAGNOSIS — Z833 Family history of diabetes mellitus: Secondary | ICD-10-CM | POA: Diagnosis not present

## 2016-08-05 DIAGNOSIS — D66 Hereditary factor VIII deficiency: Secondary | ICD-10-CM | POA: Diagnosis not present

## 2016-08-05 DIAGNOSIS — M7981 Nontraumatic hematoma of soft tissue: Secondary | ICD-10-CM | POA: Diagnosis not present

## 2016-08-05 DIAGNOSIS — R2242 Localized swelling, mass and lump, left lower limb: Secondary | ICD-10-CM | POA: Diagnosis not present

## 2016-08-05 DIAGNOSIS — E1122 Type 2 diabetes mellitus with diabetic chronic kidney disease: Secondary | ICD-10-CM | POA: Diagnosis not present

## 2016-08-05 DIAGNOSIS — N183 Chronic kidney disease, stage 3 (moderate): Secondary | ICD-10-CM | POA: Diagnosis not present

## 2016-08-05 DIAGNOSIS — R238 Other skin changes: Secondary | ICD-10-CM | POA: Diagnosis not present

## 2016-08-05 DIAGNOSIS — D68318 Other hemorrhagic disorder due to intrinsic circulating anticoagulants, antibodies, or inhibitors: Secondary | ICD-10-CM | POA: Diagnosis not present

## 2016-08-05 DIAGNOSIS — Z79899 Other long term (current) drug therapy: Secondary | ICD-10-CM | POA: Diagnosis not present

## 2016-08-05 DIAGNOSIS — I130 Hypertensive heart and chronic kidney disease with heart failure and stage 1 through stage 4 chronic kidney disease, or unspecified chronic kidney disease: Secondary | ICD-10-CM | POA: Diagnosis not present

## 2016-08-05 DIAGNOSIS — L03116 Cellulitis of left lower limb: Secondary | ICD-10-CM | POA: Diagnosis not present

## 2016-08-05 DIAGNOSIS — I429 Cardiomyopathy, unspecified: Secondary | ICD-10-CM | POA: Diagnosis not present

## 2016-08-05 DIAGNOSIS — I5032 Chronic diastolic (congestive) heart failure: Secondary | ICD-10-CM | POA: Diagnosis not present

## 2016-08-05 DIAGNOSIS — Z7952 Long term (current) use of systemic steroids: Secondary | ICD-10-CM | POA: Diagnosis not present

## 2016-08-05 DIAGNOSIS — E785 Hyperlipidemia, unspecified: Secondary | ICD-10-CM | POA: Diagnosis not present

## 2016-08-05 DIAGNOSIS — F17210 Nicotine dependence, cigarettes, uncomplicated: Secondary | ICD-10-CM | POA: Diagnosis not present

## 2016-08-05 DIAGNOSIS — K219 Gastro-esophageal reflux disease without esophagitis: Secondary | ICD-10-CM | POA: Diagnosis not present

## 2016-08-05 MED ORDER — FUROSEMIDE 20 MG PO TABS: 40.0000 mg | ORAL_TABLET | Freq: Every day | ORAL | 2 refills | Status: DC

## 2016-08-05 MED ORDER — CEPHALEXIN 500 MG PO CAPS: 500.0000 mg | ORAL_CAPSULE | Freq: Four times a day (QID) | ORAL | 0 refills | Status: DC

## 2016-08-05 NOTE — Progress Notes (Signed)
*  PRELIMINARY RESULTS* Vascular Ultrasound Left lower extremity venous duplex has been completed.  Preliminary findings: No evidence of DVT or baker's cyst.   Attempted call report to Dr. Lindi Adie. Left voice message on nurse line.   Landry Mellow, RDMS, RVT  08/05/2016, 2:08 PM

## 2016-08-05 NOTE — Progress Notes (Signed)
Patient Care Team: Biagio Borg, MD as PCP - General  DIAGNOSIS:Factor VIII inhibitor disorder  CURRENT TREATMENT: Prednisone on a tapering schedule along with Rituxan weekly 4 started 08/01/2016  CHIEF COMPLIANT: Worsening left lower extremity leg swelling with redness and pain  INTERVAL HISTORY: Richard Hopkins is a 70 year old with above-mentioned history of factor VIII inhibitor who presented with profound subcutaneous ecchymosis and hemorrhage and was started on rituximab therapy. He received his first dose of Rituxan last Friday. Before then itself his leg started to swell and it continued to swell further with causing discomfort. Because this , he came to see [place] urgently today. He also complains of pinching sensation in his left lower extremity. The bruises appear in the groin has resolved completely although it is unclear if that is what has moved into his lower extremity.  He did tolerate first dose of Rituxan fairly well.  REVIEW OF SYSTEMS:   Constitutional: Denies fevers, chills or abnormal weight loss Eyes: Denies blurriness of vision Ears, nose, mouth, throat, and face: Denies mucositis or sore throat Respiratory: Denies cough, dyspnea or wheezes Cardiovascular: Denies palpitation, chest discomfort Gastrointestinal:  Denies nausea, heartburn or change in bowel habits Skin: Denies abnormal skin rashes Lymphatics: Denies new lymphadenopathy or easy bruising Neurological:Denies numbness, tingling or new weaknesses Behavioral/Psych: Mood is stable, no new changes  Extremities: Left lower extremity ecchymosis and erythema along with significant leg swelling.  All other systems were reviewed with the patient and are negative.  I have reviewed the past medical history, past surgical history, social history and family history with the patient and they are unchanged from previous note.  ALLERGIES:  is allergic to doxycycline; ace inhibitors; cardura [doxazosin mesylate]; cozaar  [losartan potassium]; dexilant [dexlansoprazole]; hydrocodone; lisinopril; norvasc [amlodipine besylate]; and alfuzosin.  MEDICATIONS:  Current Outpatient Prescriptions  Medication Sig Dispense Refill  . albuterol (PROAIR HFA) 108 (90 BASE) MCG/ACT inhaler 2 puffs every 4 hours as needed only  if your can't catch your breath 1 Inhaler 11  . aspirin 81 MG tablet Take 81 mg by mouth daily.    . budesonide (PULMICORT) 0.25 MG/2ML nebulizer solution Take 2 mLs (0.25 mg total) by nebulization 2 (two) times daily. DX: J44.9 120 mL 11  . cephALEXin (KEFLEX) 500 MG capsule Take 1 capsule (500 mg total) by mouth 4 (four) times daily. 30 capsule 0  . cloNIDine (CATAPRES) 0.1 MG tablet Take 1 tablet (0.1 mg total) by mouth 3 (three) times daily. 270 tablet 1  . formoterol (PERFOROMIST) 20 MCG/2ML nebulizer solution Take 2 mLs (20 mcg total) by nebulization 2 (two) times daily. DX: J44.9 120 mL 11  . furosemide (LASIX) 20 MG tablet Take 2 tablets (40 mg total) by mouth daily. 60 tablet 2  . hydrochlorothiazide (HYDRODIURIL) 25 MG tablet Take 25 mg by mouth daily.    Marland Kitchen ipratropium-albuterol (DUONEB) 0.5-2.5 (3) MG/3ML SOLN Take 3 mLs by nebulization every 6 (six) hours as needed (wheezing/shortness of breath). 60 mL 11  . lovastatin (MEVACOR) 20 MG tablet TAKE TWO TABLETS BY MOUTH ONCE DAILY IN THE EVENING 180 tablet 1  . meclizine (ANTIVERT) 32 MG tablet Take 32 mg by mouth 3 (three) times daily as needed for dizziness or nausea.    . metFORMIN (GLUCOPHAGE) 500 MG tablet TAKE ONE TABLET BY MOUTH TWICE DAILY WITH  A  MEAL 180 tablet 3  . omeprazole (PRILOSEC) 40 MG capsule Take 1 capsule by mouth every afternoon. 30 capsule 6  . predniSONE (DELTASONE)  20 MG tablet Take 1 tablet (20 mg total) by mouth daily with breakfast. 60 tablet 1  . predniSONE (DELTASONE) 5 MG tablet TAKE ONE TABLET BY MOUTH  DAILY WITH BREAKFAST 90 tablet 0  . tamsulosin (FLOMAX) 0.4 MG CAPS capsule Take 1 capsule (0.4 mg total) by mouth  daily. 30 capsule 3  . varenicline (CHANTIX CONTINUING MONTH PAK) 1 MG tablet Take 1 tablet (1 mg total) by mouth 2 (two) times daily. 60 tablet 1  . varenicline (CHANTIX STARTING MONTH PAK) 0.5 MG X 11 & 1 MG X 42 tablet Take one 0.5 mg tablet by mouth once daily for 3 days, then increase to one 0.5 mg tablet twice daily for 4 days, then increase to one 1 mg tablet twice daily. 53 tablet 0   Current Facility-Administered Medications  Medication Dose Route Frequency Provider Last Rate Last Dose  . ibuprofen (ADVIL,MOTRIN) tablet 400 mg  400 mg Oral Once Chauncey Cruel, MD        PHYSICAL EXAMINATION: ECOG PERFORMANCE STATUS: 1 - Symptomatic but completely ambulatory  Vitals:   08/05/16 1251  BP: (!) 146/73  Pulse: 97  Resp: 18  Temp: 98.1 F (36.7 C)   Filed Weights   08/05/16 1251  Weight: 226 lb 8 oz (102.7 kg)    GENERAL:alert, no distress and comfortable SKIN: skin color, texture, turgor are normal, no rashes or significant lesions EYES: normal, Conjunctiva are pink and non-injected, sclera clear OROPHARYNX:no exudate, no erythema and lips, buccal mucosa, and tongue normal  NECK: supple, thyroid normal size, non-tender, without nodularity LYMPH:  no palpable lymphadenopathy in the cervical, axillary or inguinal LUNGS: clear to auscultation and percussion with normal breathing effort HEART: regular rate & rhythm and no murmurs and no lower extremity edema ABDOMEN:abdomen soft, non-tender and normal bowel sounds MUSCULOSKELETAL:no cyanosis of digits and no clubbing  NEURO: alert & oriented x 3 with fluent speech, no focal motor/sensory deficits EXTREMITIES: 3+ lower extremity edema  LABORATORY DATA:  I have reviewed the data as listed   Chemistry      Component Value Date/Time   NA 140 07/25/2016 1025   K 4.1 07/25/2016 1025   CL 103 04/25/2016 1711   CO2 23 07/25/2016 1025   BUN 16.6 07/25/2016 1025   CREATININE 1.3 07/25/2016 1025      Component Value  Date/Time   CALCIUM 9.0 07/25/2016 1025   ALKPHOS 59 07/25/2016 1025   AST 12 07/25/2016 1025   ALT 11 07/25/2016 1025   BILITOT 0.64 07/25/2016 1025       Lab Results  Component Value Date   WBC 8.7 07/25/2016   HGB 11.2 (L) 07/25/2016   HCT 35.2 (L) 07/25/2016   MCV 86.3 07/25/2016   PLT 257 07/25/2016   NEUTROABS 6.3 07/25/2016     ASSESSMENT & PLAN:  Coagulopathy (HCC) Factor VIII inhibitor: Massive hematoma/ecchymosis left lower extremity spontaneous Previously caused profound hemorrhage into the skin and subcutaneous tissues in 2016. He was then treated with prednisone and rituximab. This led to complete resolution of his symptoms. Diagnostic workup proved factor VIII inhibitor is still present currently  Current treatment: IV rituximab weekly 4 started 08/01/2016 (along with tapering schedule of prednisone) No platelet function abnormalities were detected.  Acute visit for significant leg swelling left lower extremity with redness and ecchymosis: The proximal ecchymosis and subcutaneous hemorrhage has resolved but it has moved down to his lower extremity.  Recommendation: 1. Ultrasound of the leg 2. antibiotic with Keflex because  it is warm and red 3. Lasix 40 mg daily (instructed patient to eat bananas for potassium)  Return to clinic this Friday to reassess his leg   No orders of the defined types were placed in this encounter.  The patient has a good understanding of the overall plan. he agrees with it. he will call with any problems that may develop before the next visit here.   Rulon Eisenmenger, MD 08/05/16

## 2016-08-05 NOTE — Assessment & Plan Note (Signed)
Factor VIII inhibitor: Massive hematoma/ecchymosis left lower extremity spontaneous Previously caused profound hemorrhage into the skin and subcutaneous tissues in 2016. He was then treated with prednisone and rituximab. This led to complete resolution of his symptoms. Diagnostic workup proved factor VIII inhibitor is still present currently  Current treatment: IV rituximab weekly 4 started 08/01/2016 (along with tapering schedule of prednisone) No platelet function abnormalities were detected.  Acute visit for significant leg swelling left lower extremity with redness and ecchymosis: The proximal ecchymosis and subcutaneous hemorrhage has resolved but it has moved down to his lower extremity.  Recommendation: 1. Ultrasound of the leg 2. antibiotic with Keflex because it is warm and red 3. Lasix 40 mg daily (instructed patient to eat bananas for potassium)  Return to clinic this Friday to reassess his leg

## 2016-08-05 NOTE — Telephone Encounter (Signed)
Pt called stating that he has some leg swelling and pain.  Is unable to put his shoe on and has trouble walking.  Informed Dr. Lindi Adie and he wants to see the pt.  Confirmed pt coming in today to see Dr. Lindi Adie at 1pm.

## 2016-08-06 ENCOUNTER — Telehealth: Payer: Self-pay

## 2016-08-06 ENCOUNTER — Inpatient Hospital Stay (HOSPITAL_COMMUNITY)
Admission: EM | Admit: 2016-08-06 | Discharge: 2016-08-08 | DRG: 602 | Disposition: A | Discharge status: Home / Self Care | Payer: PPO | Attending: Internal Medicine | Admitting: Internal Medicine

## 2016-08-06 ENCOUNTER — Telehealth: Payer: Self-pay | Admitting: *Deleted

## 2016-08-06 ENCOUNTER — Encounter (HOSPITAL_COMMUNITY): Payer: Self-pay

## 2016-08-06 DIAGNOSIS — I5042 Chronic combined systolic (congestive) and diastolic (congestive) heart failure: Secondary | ICD-10-CM | POA: Diagnosis present

## 2016-08-06 DIAGNOSIS — K219 Gastro-esophageal reflux disease without esophagitis: Secondary | ICD-10-CM

## 2016-08-06 DIAGNOSIS — D682 Hereditary deficiency of other clotting factors: Secondary | ICD-10-CM | POA: Diagnosis not present

## 2016-08-06 DIAGNOSIS — Z888 Allergy status to other drugs, medicaments and biological substances status: Secondary | ICD-10-CM

## 2016-08-06 DIAGNOSIS — E118 Type 2 diabetes mellitus with unspecified complications: Secondary | ICD-10-CM

## 2016-08-06 DIAGNOSIS — E1122 Type 2 diabetes mellitus with diabetic chronic kidney disease: Secondary | ICD-10-CM | POA: Diagnosis present

## 2016-08-06 DIAGNOSIS — E785 Hyperlipidemia, unspecified: Secondary | ICD-10-CM | POA: Diagnosis present

## 2016-08-06 DIAGNOSIS — D68318 Other hemorrhagic disorder due to intrinsic circulating anticoagulants, antibodies, or inhibitors: Secondary | ICD-10-CM

## 2016-08-06 DIAGNOSIS — M7989 Other specified soft tissue disorders: Secondary | ICD-10-CM | POA: Diagnosis not present

## 2016-08-06 DIAGNOSIS — D631 Anemia in chronic kidney disease: Secondary | ICD-10-CM | POA: Diagnosis present

## 2016-08-06 DIAGNOSIS — M7981 Nontraumatic hematoma of soft tissue: Secondary | ICD-10-CM | POA: Diagnosis not present

## 2016-08-06 DIAGNOSIS — Z825 Family history of asthma and other chronic lower respiratory diseases: Secondary | ICD-10-CM

## 2016-08-06 DIAGNOSIS — Z7952 Long term (current) use of systemic steroids: Secondary | ICD-10-CM

## 2016-08-06 DIAGNOSIS — I5032 Chronic diastolic (congestive) heart failure: Secondary | ICD-10-CM

## 2016-08-06 DIAGNOSIS — N401 Enlarged prostate with lower urinary tract symptoms: Secondary | ICD-10-CM | POA: Diagnosis present

## 2016-08-06 DIAGNOSIS — R351 Nocturia: Secondary | ICD-10-CM | POA: Diagnosis present

## 2016-08-06 DIAGNOSIS — T148XXA Other injury of unspecified body region, initial encounter: Secondary | ICD-10-CM

## 2016-08-06 DIAGNOSIS — J8410 Pulmonary fibrosis, unspecified: Secondary | ICD-10-CM | POA: Diagnosis present

## 2016-08-06 DIAGNOSIS — N182 Chronic kidney disease, stage 2 (mild): Secondary | ICD-10-CM

## 2016-08-06 DIAGNOSIS — L0390 Cellulitis, unspecified: Secondary | ICD-10-CM

## 2016-08-06 DIAGNOSIS — F17210 Nicotine dependence, cigarettes, uncomplicated: Secondary | ICD-10-CM | POA: Diagnosis present

## 2016-08-06 DIAGNOSIS — J449 Chronic obstructive pulmonary disease, unspecified: Secondary | ICD-10-CM | POA: Diagnosis present

## 2016-08-06 DIAGNOSIS — D66 Hereditary factor VIII deficiency: Secondary | ICD-10-CM | POA: Diagnosis present

## 2016-08-06 DIAGNOSIS — L03116 Cellulitis of left lower limb: Principal | ICD-10-CM | POA: Diagnosis present

## 2016-08-06 DIAGNOSIS — Z7982 Long term (current) use of aspirin: Secondary | ICD-10-CM

## 2016-08-06 DIAGNOSIS — I130 Hypertensive heart and chronic kidney disease with heart failure and stage 1 through stage 4 chronic kidney disease, or unspecified chronic kidney disease: Secondary | ICD-10-CM | POA: Diagnosis present

## 2016-08-06 DIAGNOSIS — Z881 Allergy status to other antibiotic agents status: Secondary | ICD-10-CM

## 2016-08-06 DIAGNOSIS — Z79899 Other long term (current) drug therapy: Secondary | ICD-10-CM

## 2016-08-06 DIAGNOSIS — Z7951 Long term (current) use of inhaled steroids: Secondary | ICD-10-CM

## 2016-08-06 DIAGNOSIS — N183 Chronic kidney disease, stage 3 (moderate): Secondary | ICD-10-CM | POA: Diagnosis present

## 2016-08-06 DIAGNOSIS — Z833 Family history of diabetes mellitus: Secondary | ICD-10-CM

## 2016-08-06 DIAGNOSIS — T148 Other injury of unspecified body region: Secondary | ICD-10-CM | POA: Diagnosis not present

## 2016-08-06 DIAGNOSIS — I429 Cardiomyopathy, unspecified: Secondary | ICD-10-CM | POA: Diagnosis present

## 2016-08-06 DIAGNOSIS — N189 Chronic kidney disease, unspecified: Secondary | ICD-10-CM

## 2016-08-06 DIAGNOSIS — M79662 Pain in left lower leg: Secondary | ICD-10-CM

## 2016-08-06 DIAGNOSIS — Z7984 Long term (current) use of oral hypoglycemic drugs: Secondary | ICD-10-CM

## 2016-08-06 LAB — CBC WITH DIFFERENTIAL/PLATELET
BASOS ABS: 0 10*3/uL (ref 0.0–0.1)
BASOS PCT: 0 %
Eosinophils Absolute: 0.2 10*3/uL (ref 0.0–0.7)
Eosinophils Relative: 2 %
HEMATOCRIT: 38.6 % — AB (ref 39.0–52.0)
HEMOGLOBIN: 12.1 g/dL — AB (ref 13.0–17.0)
LYMPHS PCT: 13 %
Lymphs Abs: 1.3 10*3/uL (ref 0.7–4.0)
MCH: 27.9 pg (ref 26.0–34.0)
MCHC: 31.3 g/dL (ref 30.0–36.0)
MCV: 89.1 fL (ref 78.0–100.0)
Monocytes Absolute: 1.5 10*3/uL — ABNORMAL HIGH (ref 0.1–1.0)
Monocytes Relative: 16 %
NEUTROS ABS: 6.6 10*3/uL (ref 1.7–7.7)
NEUTROS PCT: 69 %
Platelets: 316 10*3/uL (ref 150–400)
RBC: 4.33 MIL/uL (ref 4.22–5.81)
RDW: 16.8 % — ABNORMAL HIGH (ref 11.5–15.5)
WBC: 9.6 10*3/uL (ref 4.0–10.5)

## 2016-08-06 LAB — BASIC METABOLIC PANEL
ANION GAP: 7 (ref 5–15)
BUN: 20 mg/dL (ref 6–20)
CALCIUM: 8.7 mg/dL — AB (ref 8.9–10.3)
CO2: 29 mmol/L (ref 22–32)
Chloride: 102 mmol/L (ref 101–111)
Creatinine, Ser: 1.33 mg/dL — ABNORMAL HIGH (ref 0.61–1.24)
GFR, EST NON AFRICAN AMERICAN: 52 mL/min — AB (ref >60)
GLUCOSE: 87 mg/dL (ref 65–99)
POTASSIUM: 3.5 mmol/L (ref 3.5–5.1)
Sodium: 138 mmol/L (ref 135–145)

## 2016-08-06 LAB — GLUCOSE, CAPILLARY: GLUCOSE-CAPILLARY: 123 mg/dL — AB (ref 65–99)

## 2016-08-06 MED ORDER — VANCOMYCIN HCL 10 G IV SOLR: 1250.0000 mg | INTRAVENOUS | Status: DC

## 2016-08-06 MED ORDER — SODIUM CHLORIDE 0.9% FLUSH: 3.0000 mL | INTRAVENOUS | Status: DC | PRN

## 2016-08-06 MED ORDER — SODIUM CHLORIDE 0.9% FLUSH
3.0000 mL | Freq: Two times a day (BID) | INTRAVENOUS | Status: DC
  Administered 2016-08-07: 3 mL via INTRAVENOUS

## 2016-08-06 MED ORDER — ACETAMINOPHEN 650 MG RE SUPP: 650.0000 mg | Freq: Four times a day (QID) | RECTAL | Status: DC | PRN

## 2016-08-06 MED ORDER — CLINDAMYCIN PHOSPHATE 600 MG/50ML IV SOLN
600.0000 mg | Freq: Three times a day (TID) | INTRAVENOUS | Status: DC
  Administered 2016-08-06 – 2016-08-08 (×5): 600 mg via INTRAVENOUS
  Filled 2016-08-06 (×5): qty 50

## 2016-08-06 MED ORDER — ACETAMINOPHEN 325 MG PO TABS: 650.0000 mg | ORAL_TABLET | Freq: Four times a day (QID) | ORAL | Status: DC | PRN

## 2016-08-06 MED ORDER — ONDANSETRON HCL 4 MG/2ML IJ SOLN
4.0000 mg | Freq: Once | INTRAMUSCULAR | Status: AC
  Administered 2016-08-06: 4 mg via INTRAVENOUS
  Filled 2016-08-06: qty 2

## 2016-08-06 MED ORDER — SODIUM CHLORIDE 0.9 % IV SOLN
INTRAVENOUS | Status: DC
  Administered 2016-08-06: 13:00:00 via INTRAVENOUS

## 2016-08-06 MED ORDER — ONDANSETRON HCL 4 MG PO TABS: 4.0000 mg | ORAL_TABLET | Freq: Four times a day (QID) | ORAL | Status: DC | PRN

## 2016-08-06 MED ORDER — INSULIN ASPART 100 UNIT/ML ~~LOC~~ SOLN
0.0000 [IU] | Freq: Every day | SUBCUTANEOUS | Status: DC
  Administered 2016-08-07: 2 [IU] via SUBCUTANEOUS

## 2016-08-06 MED ORDER — KETOROLAC TROMETHAMINE 30 MG/ML IJ SOLN
30.0000 mg | Freq: Once | INTRAMUSCULAR | Status: AC
  Administered 2016-08-06: 30 mg via INTRAVENOUS
  Filled 2016-08-06: qty 1

## 2016-08-06 MED ORDER — PREDNISONE 20 MG PO TABS
60.0000 mg | ORAL_TABLET | Freq: Every day | ORAL | Status: DC
  Administered 2016-08-07 – 2016-08-08 (×2): 60 mg via ORAL
  Filled 2016-08-06 (×2): qty 3

## 2016-08-06 MED ORDER — VANCOMYCIN HCL 10 G IV SOLR
2000.0000 mg | INTRAVENOUS | Status: AC
  Administered 2016-08-06: 2000 mg via INTRAVENOUS
  Filled 2016-08-06: qty 1000

## 2016-08-06 MED ORDER — IPRATROPIUM-ALBUTEROL 0.5-2.5 (3) MG/3ML IN SOLN: 3.0000 mL | Freq: Four times a day (QID) | RESPIRATORY_TRACT | Status: DC | PRN

## 2016-08-06 MED ORDER — PANTOPRAZOLE SODIUM 40 MG PO TBEC
80.0000 mg | DELAYED_RELEASE_TABLET | Freq: Every day | ORAL | Status: DC
  Administered 2016-08-06 – 2016-08-08 (×3): 80 mg via ORAL
  Filled 2016-08-06 (×3): qty 2

## 2016-08-06 MED ORDER — ASPIRIN EC 81 MG PO TBEC
81.0000 mg | DELAYED_RELEASE_TABLET | Freq: Every day | ORAL | Status: DC
  Administered 2016-08-06 – 2016-08-08 (×3): 81 mg via ORAL
  Filled 2016-08-06 (×3): qty 1

## 2016-08-06 MED ORDER — OXYCODONE HCL 5 MG PO TABS
5.0000 mg | ORAL_TABLET | ORAL | Status: DC | PRN
  Administered 2016-08-07: 5 mg via ORAL
  Filled 2016-08-06: qty 1

## 2016-08-06 MED ORDER — ONDANSETRON HCL 4 MG/2ML IJ SOLN: 4.0000 mg | Freq: Four times a day (QID) | INTRAMUSCULAR | Status: DC | PRN

## 2016-08-06 MED ORDER — FENTANYL CITRATE (PF) 100 MCG/2ML IJ SOLN
100.0000 ug | INTRAMUSCULAR | Status: DC | PRN
  Administered 2016-08-06 (×2): 100 ug via INTRAVENOUS
  Filled 2016-08-06 (×2): qty 2

## 2016-08-06 MED ORDER — SODIUM CHLORIDE 0.9 % IV SOLN: 250.0000 mL | INTRAVENOUS | Status: DC | PRN

## 2016-08-06 MED ORDER — INSULIN ASPART 100 UNIT/ML ~~LOC~~ SOLN
0.0000 [IU] | Freq: Three times a day (TID) | SUBCUTANEOUS | Status: DC
  Administered 2016-08-07: 1 [IU] via SUBCUTANEOUS
  Administered 2016-08-07: 2 [IU] via SUBCUTANEOUS
  Administered 2016-08-08: 1 [IU] via SUBCUTANEOUS

## 2016-08-06 MED ORDER — RITUXIMAB CHEMO INJECTION 500 MG/50ML: 900.0000 mg | Freq: Once | INTRAVENOUS | Status: DC

## 2016-08-06 MED ORDER — HEPARIN SODIUM (PORCINE) 5000 UNIT/ML IJ SOLN: 5000.0000 [IU] | Freq: Three times a day (TID) | INTRAMUSCULAR | Status: DC

## 2016-08-06 MED ORDER — FUROSEMIDE 10 MG/ML IJ SOLN
40.0000 mg | Freq: Once | INTRAMUSCULAR | Status: AC
  Administered 2016-08-06: 40 mg via INTRAVENOUS
  Filled 2016-08-06: qty 4

## 2016-08-06 MED ORDER — CLONIDINE HCL 0.1 MG PO TABS
0.1000 mg | ORAL_TABLET | Freq: Three times a day (TID) | ORAL | Status: DC
  Administered 2016-08-06 – 2016-08-08 (×5): 0.1 mg via ORAL
  Filled 2016-08-06 (×5): qty 1

## 2016-08-06 MED ORDER — FUROSEMIDE 40 MG PO TABS
40.0000 mg | ORAL_TABLET | Freq: Every day | ORAL | Status: DC
  Administered 2016-08-07 – 2016-08-08 (×2): 40 mg via ORAL
  Filled 2016-08-06 (×2): qty 1

## 2016-08-06 MED ORDER — MORPHINE SULFATE (PF) 4 MG/ML IV SOLN
4.0000 mg | INTRAVENOUS | Status: DC | PRN
  Administered 2016-08-06 – 2016-08-08 (×6): 4 mg via INTRAVENOUS
  Filled 2016-08-06 (×6): qty 1

## 2016-08-06 NOTE — ED Provider Notes (Signed)
[facility] DEPT Provider Note   CSN: [id number] Arrival date & time: 08/06/16  1108     History   Chief Complaint Chief Complaint  Patient presents with  . Leg Swelling    HPI Richard Hopkins is a 70 y.o. male.  He presents for evaluation of persistent left lower leg swelling and pain, despite being treated with right exam, and prednisone, for secondary to factor VIII deficiency. Treatment was started several days ago. History same, in the same leg. Yesterday, he had a DVT study of the left leg which was read as normal. He denies fever, chills, cough, shortness of breath, chest pain, weakness or dizziness. There are no other no modifying factors.  HPI  Past Medical History:  Diagnosis Date  . ABNORMAL ELECTROCARDIOGRAM   . Acute prostatitis   . ADENOMATOUS COLONIC POLYP   . ALLERGIC RHINITIS   . BENIGN PROSTATIC HYPERTROPHY   . BLURRED VISION   . Brain tumor ([place])   . Cardiomyopathy ([place]) 01/12/2016  . CHRONIC OBSTRUCTIVE PULMONARY DISEASE, ACUTE EXACERBATION   . Chronic steroid use   . COPD   . COPD (chronic obstructive pulmonary disease) ([place])   . DIABETES MELLITUS, TYPE II   . DIVERTICULAR DISEASE   . DYSPNEA   . DYSPNEA/SHORTNESS OF BREATH   . EPIDIDYMO-ORCHITIS   . ERECTILE DYSFUNCTION, ORGANIC, HX OF   . ESOPHAGEAL STRICTURE   . FATIGUE   . GERD   . HYPERLIPIDEMIA   . HYPERTENSION   . LUMBAR DISC DISORDER   . MASS, SUPERFICIAL   . MUSCLE CRAMPS   . Nocturia   . Pain in soft tissues of limb   . PSA, INCREASED   . PULMONARY FIBROSIS   . SKIN LESION   . SMOKER   . SYNCOPE   . THRUSH   . Wheezing     Patient Active Problem List   Diagnosis Date Noted  . Coagulopathy ([place]) 08/05/2016  . Monocytosis 04/25/2016  . Cardiomyopathy ([place]) 01/12/2016  . Dilated aortic root ([place]) 01/08/2016  . Dizziness 01/07/2016  . Blurred vision 01/07/2016  . Temporal lobe lesion 01/07/2016  . BPH (benign prostatic hypertrophy) 01/07/2016  . Abnormal CT scan,  esophagus 07/25/2015  . Loss of weight 07/25/2015  . BPV (benign positional vertigo) 06/02/2015  . Cough 05/11/2015  . Type 2 diabetes mellitus ([place]) 04/13/2015  . GERD (gastroesophageal reflux disease) 04/13/2015  . Tobacco abuse 04/13/2015  . SOB (shortness of breath)   . CAP (community acquired pneumonia) 04/11/2015  . COPD exacerbation ([place]) 04/11/2015  . Acute respiratory failure with hypoxia ([place]) 04/11/2015  . Fatigue 04/03/2015  . Unspecified constipation 05/04/2014  . Lumbar radiculopathy 04/27/2014  . Acute blood loss anemia 02/21/2014  . Leukocytosis, unspecified 02/21/2014  . Factor VIII inhibitor disorder ([place]) 02/20/2014  . Bruising 02/18/2014  . Left leg pain 02/09/2014  . Rib pain on right side 10/06/2013  . Pulmonary nodules/lesions, multiple 03/04/2013  . 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 Current use of steroid medication 03/01/2013  . Prepatellar bursitis of left knee 09/16/2012  . Thrush 09/16/2012  . Scrotal rash 05/06/2012  . Skin lesion 05/06/2012  . Thumb pain, left 05/06/2012  . Brain tumor ([place]) 09/21/2011  . LUE weakness 09/17/2011  . Neck pain 08/20/2011  . Preventative health care 05/05/2011  . SYNCOPE 01/16/2011  . SMOKER 08/30/2010  . PSA, INCREASED 06/26/2010  . ABNORMAL ELECTROCARDIOGRAM 08/24/2009  . BPH associated with nocturia 10/09/2008  . Nocturia 10/09/2008  . 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  Hyperlipidemia 09/28/2007  . Essential hypertension 09/28/2007  . Allergic rhinitis 09/28/2007  . COPD GOLD III/IV  09/28/2007  . ESOPHAGEAL STRICTURE 09/28/2007  . GERD 09/28/2007  . LUMBAR DISC DISORDER 09/28/2007  . FATIGUE 09/28/2007  . 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 DIVERTICULAR DISEASE 08/31/2007    Past Surgical History:  Procedure Laterality Date  . BACK SURGERY     x 4 disabled after 2003  . CARDIAC CATHETERIZATION N/A 03/26/2016   Procedure: Left Heart Cath and  Coronary Angiography;  Surgeon: Josue Hector, MD;  Location: [facility];  Service: Cardiovascular;  Laterality: N/A;  . ELBOW ARTHROSCOPY         Home Medications    Prior to Admission medications   Medication Sig Start Date End Date Taking? Authorizing Provider  albuterol (PROAIR HFA) 108 (90 BASE) MCG/ACT inhaler 2 puffs every 4 hours as needed only  if your can't catch your breath Patient taking differently: Inhale 1-2 puffs into the lungs every 4 (four) hours as needed for wheezing or shortness of breath.  09/18/15  Yes Biagio Borg, MD  aspirin 81 MG tablet Take 81 mg by mouth daily.   Yes Historical Provider, MD  cephALEXin (KEFLEX) 500 MG capsule Take 1 capsule (500 mg total) by mouth 4 (four) times daily. 08/05/16  Yes Nicholas Lose, MD  cloNIDine (CATAPRES) 0.1 MG tablet Take 1 tablet (0.1 mg total) by mouth 3 (three) times daily. 01/07/16  Yes Biagio Borg, MD  furosemide (LASIX) 20 MG tablet Take 2 tablets (40 mg total) by mouth daily. 08/05/16  Yes Nicholas Lose, MD  ipratropium-albuterol (DUONEB) 0.5-2.5 (3) MG/3ML SOLN Take 3 mLs by nebulization every 6 (six) hours as needed (wheezing/shortness of breath). 09/18/15  Yes Biagio Borg, MD  metFORMIN (GLUCOPHAGE) 500 MG tablet TAKE ONE TABLET BY MOUTH TWICE DAILY WITH  A  MEAL Patient taking differently: Take 500 mg by mouth 2 (two) times daily with a meal.  01/07/16  Yes Biagio Borg, MD  omeprazole (PRILOSEC) 40 MG capsule Take 1 capsule by mouth every afternoon. Patient taking differently: Take 40 mg by mouth daily.  03/08/15  Yes Lafayette Dragon, MD  predniSONE (DELTASONE) 20 MG tablet Take 1 tablet (20 mg total) by mouth daily with breakfast. 07/25/16  Yes Nicholas Lose, MD  budesonide (PULMICORT) 0.25 MG/2ML nebulizer solution Take 2 mLs (0.25 mg total) by nebulization 2 (two) times daily. DX: J44.9 Patient not taking: Reported on 08/06/2016 09/18/15   Biagio Borg, MD  formoterol (PERFOROMIST) 20 MCG/2ML nebulizer solution Take 2  mLs (20 mcg total) by nebulization 2 (two) times daily. DX: J44.9 Patient not taking: Reported on 08/06/2016 09/18/15   Biagio Borg, MD  lovastatin (MEVACOR) 20 MG tablet TAKE TWO TABLETS BY MOUTH ONCE DAILY IN THE EVENING Patient not taking: Reported on 08/06/2016 10/03/15   Biagio Borg, MD  predniSONE (DELTASONE) 5 MG tablet TAKE ONE TABLET BY MOUTH  DAILY WITH BREAKFAST Patient not taking: Reported on 08/06/2016 07/16/16   Biagio Borg, MD  tamsulosin (FLOMAX) 0.4 MG CAPS capsule Take 1 capsule (0.4 mg total) by mouth daily. Patient not taking: Reported on 08/06/2016 01/07/16   Biagio Borg, MD  varenicline (CHANTIX CONTINUING MONTH PAK) 1 MG tablet Take 1 tablet (1 mg total) by mouth 2 (two) times daily. Patient not taking: Reported on 08/06/2016 04/25/16   Biagio Borg, MD  varenicline [facility]  STARTING MONTH PAK) 0.5 MG X 11 & 1 MG X 42 tablet Take one 0.5 mg tablet by mouth once daily for 3 days, then increase to one 0.5 mg tablet twice daily for 4 days, then increase to one 1 mg tablet twice daily. Patient not taking: Reported on 08/06/2016 04/25/16   Biagio Borg, MD    Family History Family History  Problem Relation Age of Onset  . Diabetes Mother   . Asthma Mother   . Emphysema Mother     smoker  . Colon cancer Neg Hx     Social History Social History  Substance Use Topics  . Smoking status: Current Every Day Smoker    Packs/day: 1.00    Years: 50.00    Types: Cigarettes  . Smokeless tobacco: Never Used  . Alcohol use No     Allergies   Doxycycline; Ace inhibitors; Cardura [doxazosin mesylate]; Cozaar [losartan potassium]; Dexilant [dexlansoprazole]; Hydrocodone; Lisinopril; Norvasc [amlodipine besylate]; and Alfuzosin   Review of Systems Review of Systems  All other systems reviewed and are negative.    Physical Exam Updated Vital Signs BP 161/86 (BP Location: Right Arm)   Pulse 66   Temp 98.5 F (36.9 C) (Oral)   Resp 18   Ht 6\' 3"  (1.905 m)   Wt 226 lb (102.5  kg)   SpO2 97%   BMI 28.25 kg/m   Physical Exam  Constitutional: He is oriented to person, place, and time. He appears well-developed and well-nourished. No distress.  HENT:  Head: Normocephalic and atraumatic.  Right Ear: External ear normal.  Left Ear: External ear normal.  Eyes: Conjunctivae and EOM are normal. Pupils are equal, round, and reactive to light.  Neck: Normal range of motion and phonation normal. Neck supple.  Cardiovascular: Normal rate, regular rhythm and normal heart sounds.   Normal capillary refill toes of both feet.  Unable to palpate pulses. Left foot, with Doppler--there is a biphasic posterior tibial pulse, and absent dorsalis pedis pulse.  Pulmonary/Chest: Effort normal and breath sounds normal. He exhibits no bony tenderness.  Abdominal: Soft. There is no tenderness.  Musculoskeletal: Normal range of motion.  Moderate swelling left lower leg from below knee to mid foot. Moderate tenderness medially along the left lower leg, and left ankle regions. No pain with movement of the toes of the left foot.  Neurological: He is alert and oriented to person, place, and time. No cranial nerve deficit or sensory deficit. He exhibits normal muscle tone. Coordination normal.  Skin: Skin is warm, dry and intact. There is erythema (Left lower leg, and foot. He did drainage, pustules, significant skin lesions.).  Psychiatric: He has a normal mood and affect. His behavior is normal. Judgment and thought content normal.  Nursing note and vitals reviewed.    ED Treatments / Results  Labs (all labs ordered are listed, but only abnormal results are displayed) Labs Reviewed  BASIC METABOLIC PANEL - Abnormal; Notable for the following:       Result Value   Creatinine, Ser 1.33 (*)    Calcium 8.7 (*)    GFR calc non Af Amer 52 (*)    All other components within normal limits  CBC WITH DIFFERENTIAL/PLATELET - Abnormal; Notable for the following:    Hemoglobin 12.1 (*)    HCT  38.6 (*)    RDW 16.8 (*)    Monocytes Absolute 1.5 (*)    All other components within normal limits    EKG  EKG Interpretation  None       Radiology No results found.  Procedures Procedures (including critical care time)  Medications Ordered in ED Medications  0.9 %  sodium chloride infusion ( Intravenous New Bag/Given 08/06/16 1301)  fentaNYL (SUBLIMAZE) injection 100 mcg (100 mcg Intravenous Given 08/06/16 1502)  ondansetron (ZOFRAN) injection 4 mg (4 mg Intravenous Given 08/06/16 1259)     Initial Impression / Assessment and Plan / ED Course  I have reviewed the triage vital signs and the nursing notes.  Pertinent labs & imaging results that were available during my care of the patient were reviewed by me and considered in my medical decision making (see chart for details).  Clinical Course    Medications  0.9 %  sodium chloride infusion ( Intravenous New Bag/Given 08/06/16 1301)  fentaNYL (SUBLIMAZE) injection 100 mcg (100 mcg Intravenous Given 08/06/16 1502)  ondansetron (ZOFRAN) injection 4 mg (4 mg Intravenous Given 08/06/16 1259)    Patient Vitals for the past 24 hrs:  BP Temp Temp src Pulse Resp SpO2 Height Weight  08/06/16 1455 161/86 - - 66 18 97 % - -  08/06/16 1334 143/89 - - 71 16 98 % - -  08/06/16 1137 155/88 98.5 F (36.9 C) Oral 87 16 98 % 6\' 3"  (1.905 m) 226 lb (102.5 kg)    3:38 PM Reevaluation with update and discussion. After initial assessment and treatment, an updated evaluation reveals He remains fairly comfortable at rest, although left ankle still hurts to palpation. Findings discussed with patient, all questions answered. Sostenes Kauffmann L    3:35 PM-Consult complete with Dr. Barbaraann Faster. Patient case explained and discussed. He agrees to admit patient for further evaluation and treatment. Call ended at 15:40- he states that he will see the patient, contact oncology, and arrange for admission.  Final Clinical Impressions(s) / ED Diagnoses   Final  diagnoses:  Hematoma  Pain and swelling of left lower leg  Factor VII deficiency (HCC)  Cellulitis of left lower leg    Left lower leg swelling and pain, starting with hematoma of the left thigh. This is a similar syndrome, to one the had previously. He is currently being treated by his oncologist, for factor VIII deficiency with Rituxan, prednisone, and Keflex for the possibility of cellulitis. Patient seasonally improving, when at rest, not having moderate pain when he puts his foot down. No previously known arterial insufficiency, exam somewhat limited by swelling. There is a diminished pulse left posterior tibial, but no acute limb ischemia is suspected. Patient will benefit from a period of observation, with leg elevation. He may require consultations with oncology, and vascular surgery. There is no apparent DVT, this time.   Nursing Notes Reviewed/ Care Coordinated, and agree without changes. Applicable Imaging Reviewed.  Interpretation of Laboratory Data incorporated into ED treatment  Plan: Admit   New Prescriptions New Prescriptions   No medications on file     Daleen Bo, MD 08/06/16 [phone]

## 2016-08-06 NOTE — Telephone Encounter (Signed)
Received VM from pt asking whether he should present to ED for evaluation.  Pt reporting increased swelling and pain in leg.  Lindi Adie, MD notified and d/t new onset of pain, recommendation is for pt to be evaluated in ED d/t concerns for compartment syndrome.  Called pt back immediately and informed him to seek ED for evaluation.  Pt verbalized understanding and stated he would go to ED.  No further questions or concerns at time of call.

## 2016-08-06 NOTE — H&P (Addendum)
History and Physical    Richard Hopkins [id number] DOB: 04/30/1946 DOA: 08/06/2016  PCP: Cathlean Cower, MD Patient coming from: Home Chief Complaint: LLE swelling and pain  HPI: Richard Hopkins is a 70 y.o. male with medical history significant of BPH, COPD, GERD, HLD, HTN, pulmonary fibrosis on chronic steroids, who presented to the ED after several day history of constant progressive left lower extremity swelling and pain. Associated with progressive erythema. Patient with known history of factor VIII inhibitor disorder. Patient was seen by oncology on the day prior to admission for evaluation of the problem list above. At that time he was started on Keflex worsening symptoms. Of note 1 week prior he had been put on prednisone 40 mg and rituximab. Symptoms have worsened significantly over the last 24 hours with progression of the redness and swelling in his leg. Denies any loss of sensation in his foot or toes. Symptoms are constant with waxing and waning nature. Denies any fevers, chest pain, palpitations, shortness of breath, neck stiffness, headache, dysuria, frequency, back pain, rash.   ED Course: Objective findings outlined below. Of note patient did have pulses which were found by Doppler.  Review of Systems: As per HPI otherwise 10 point review of systems negative.   Ambulatory Status: No restrictions at baseline  Past Medical History:  Diagnosis Date  . ABNORMAL ELECTROCARDIOGRAM   . Acute prostatitis   . ADENOMATOUS COLONIC POLYP   . ALLERGIC RHINITIS   . BENIGN PROSTATIC HYPERTROPHY   . BLURRED VISION   . Brain tumor ([place])   . Cardiomyopathy ([place]) 01/12/2016  . CHRONIC OBSTRUCTIVE PULMONARY DISEASE, ACUTE EXACERBATION   . Chronic steroid use   . COPD   . COPD (chronic obstructive pulmonary disease) ([place])   . DIABETES MELLITUS, TYPE II   . DIVERTICULAR DISEASE   . DYSPNEA   . DYSPNEA/SHORTNESS OF BREATH   . EPIDIDYMO-ORCHITIS   . ERECTILE DYSFUNCTION, ORGANIC, HX OF    . ESOPHAGEAL STRICTURE   . FATIGUE   . GERD   . HYPERLIPIDEMIA   . HYPERTENSION   . LUMBAR DISC DISORDER   . MASS, SUPERFICIAL   . MUSCLE CRAMPS   . Nocturia   . Pain in soft tissues of limb   . PSA, INCREASED   . PULMONARY FIBROSIS   . SKIN LESION   . SMOKER   . SYNCOPE   . THRUSH   . Wheezing     Past Surgical History:  Procedure Laterality Date  . BACK SURGERY     x 4 disabled after 2003  . CARDIAC CATHETERIZATION N/A 03/26/2016   Procedure: Left Heart Cath and Coronary Angiography;  Surgeon: Josue Hector, MD;  Location: [facility];  Service: Cardiovascular;  Laterality: N/A;  . ELBOW ARTHROSCOPY      Social History   Social History  . Marital status: Married    Spouse name: N/A  . Number of children: 5  . Years of education: N/A   Occupational History  . disabled Retired   Social History Main Topics  . Smoking status: Current Every Day Smoker    Packs/day: 1.00    Years: 50.00    Types: Cigarettes  . Smokeless tobacco: Never Used  . Alcohol use No  . Drug use: No  . Sexual activity: Not on file   Other Topics Concern  . 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 Doxycycline Nausea Only and Other (See Comments)    Loss of memory; "bad feeling," dizziness  . Ace Inhibitors Cough  . Cardura [Doxazosin Mesylate] Other (See Comments)    dizziness  . Cozaar [Losartan Potassium] Other (See Comments)    dizziness  . Dexilant [Dexlansoprazole] Itching and Rash  . Hydrocodone Nausea Only and Other (See Comments)    dizziness  . Lisinopril Cough  . Norvasc [Amlodipine Besylate] Other (See Comments)    3 times dizzy, bad taste in mouth, none since stopped  . Alfuzosin Rash    Family History  Problem Relation Age of Onset  . Diabetes Mother   . Asthma Mother   .  Emphysema Mother     smoker  . Colon cancer Neg Hx     Prior to Admission medications   Medication Sig Start Date End Date Taking? Authorizing Provider  albuterol (PROAIR HFA) 108 (90 BASE) MCG/ACT inhaler 2 puffs every 4 hours as needed only  if your can't catch your breath Patient taking differently: Inhale 1-2 puffs into the lungs every 4 (four) hours as needed for wheezing or shortness of breath.  09/18/15  Yes Corwin Levins, MD  aspirin 81 MG tablet Take 81 mg by mouth daily.   Yes Historical Provider, MD  cephALEXin (KEFLEX) 500 MG capsule Take 1 capsule (500 mg total) by mouth 4 (four) times daily. 08/05/16  Yes Serena Croissant, MD  cloNIDine (CATAPRES) 0.1 MG tablet Take 1 tablet (0.1 mg total) by mouth 3 (three) times daily. 01/07/16  Yes Corwin Levins, MD  furosemide (LASIX) 20 MG tablet Take 2 tablets (40 mg total) by mouth daily. 08/05/16  Yes Serena Croissant, MD  ipratropium-albuterol (DUONEB) 0.5-2.5 (3) MG/3ML SOLN Take 3 mLs by nebulization every 6 (six) hours as needed (wheezing/shortness of breath). 09/18/15  Yes Corwin Levins, MD  metFORMIN (GLUCOPHAGE) 500 MG tablet TAKE ONE TABLET BY MOUTH TWICE DAILY WITH  A  MEAL Patient taking differently: Take 500 mg by mouth 2 (two) times daily with a meal.  01/07/16  Yes Corwin Levins, MD  omeprazole (PRILOSEC) 40 MG capsule Take 1 capsule by mouth every afternoon. Patient taking differently: Take 40 mg by mouth daily.  03/08/15  Yes Hart Carwin, MD  predniSONE (DELTASONE) 20 MG tablet Take 1 tablet (20 mg total) by mouth daily with breakfast. 07/25/16  Yes Serena Croissant, MD  budesonide (PULMICORT) 0.25 MG/2ML nebulizer solution Take 2 mLs (0.25 mg total) by nebulization 2 (two) times daily. DX: J44.9 Patient not taking: Reported on 08/06/2016 09/18/15   Corwin Levins, MD  formoterol (PERFOROMIST) 20 MCG/2ML nebulizer solution Take 2 mLs (20 mcg total) by nebulization 2 (two) times daily. DX: J44.9 Patient not taking: Reported on 08/06/2016 09/18/15   Corwin Levins, MD  lovastatin (MEVACOR) 20 MG tablet TAKE TWO TABLETS BY MOUTH ONCE DAILY IN THE EVENING Patient not taking: Reported on 08/06/2016 10/03/15   Corwin Levins, MD  predniSONE (DELTASONE) 5 MG tablet TAKE ONE TABLET BY MOUTH  DAILY WITH BREAKFAST Patient not taking: Reported on 08/06/2016 07/16/16   Corwin Levins, MD  tamsulosin (FLOMAX) 0.4 MG CAPS capsule Take 1 capsule (0.4 mg total) by mouth daily. Patient not taking: Reported on 08/06/2016 01/07/16   Corwin Levins, MD  varenicline (CHANTIX CONTINUING MONTH PAK) 1 MG tablet Take 1  tablet (1 mg total) by mouth 2 (two) times daily. Patient not taking: Reported on 08/06/2016 04/25/16   Biagio Borg, MD  varenicline (CHANTIX STARTING MONTH PAK) 0.5 MG X 11 & 1 MG X 42 tablet Take one 0.5 mg tablet by mouth once daily for 3 days, then increase to one 0.5 mg tablet twice daily for 4 days, then increase to one 1 mg tablet twice daily. Patient not taking: Reported on 08/06/2016 04/25/16   Biagio Borg, MD    Physical Exam: Vitals:   08/06/16 1455 08/06/16 1500 08/06/16 1600 08/06/16 1700  BP: 161/86 161/90 158/84 178/96  Pulse: 66 69 69 67  Resp: 18  16   Temp:      TempSrc:      SpO2: 97% 97% 95% 95%  Weight:      Height:         General:  Appears calm and comfortable Eyes:  PERRL, EOMI, normal lids, iris ENT:  grossly normal hearing, lips & tongue, mmm Neck:  no LAD, masses or thyromegaly Cardiovascular:  RRR, no m/r/g. No LE edema.  Respiratory:  CTA bilaterally, no w/r/r. Normal respiratory effort. Abdomen:  soft, ntnd, NABS Skin:  Marked erythema of the left lower extremity predominantly in the anterior leg with few areas of skin desquamation. Musculoskeletal:  2-3+ left lower 70 pitting edema without bony abnormality pre-she did. Toes bilaterally with less than 2 second cap refill. Psychiatric:  grossly normal mood and affect, speech fluent and appropriate, AOx3 Neurologic:  CN 2-12 grossly intact, moves all extremities in coordinated  fashion, sensation intact  Labs on Admission: I have personally reviewed following labs and imaging studies  CBC:  Recent Labs Lab 08/06/16 1302  WBC 9.6  NEUTROABS 6.6  HGB 12.1*  HCT 38.6*  MCV 89.1  PLT [id number]   Basic Metabolic Panel:  Recent Labs Lab 08/06/16 1302  NA 138  K 3.5  CL 102  CO2 29  GLUCOSE 87  BUN 20  CREATININE 1.33*  CALCIUM 8.7*   GFR: Estimated Creatinine Clearance: 65.1 mL/min (by C-G formula based on SCr of 1.33 mg/dL). Liver Function Tests: No results for input(s): AST, ALT, ALKPHOS, BILITOT, PROT, ALBUMIN in the last 168 hours. No results for input(s): LIPASE, AMYLASE in the last 168 hours. No results for input(s): AMMONIA in the last 168 hours. Coagulation Profile: No results for input(s): INR, PROTIME in the last 168 hours. Cardiac Enzymes: No results for input(s): CKTOTAL, CKMB, CKMBINDEX, TROPONINI in the last 168 hours. BNP (last 3 results) No results for input(s): PROBNP in the last 8760 hours. HbA1C: No results for input(s): HGBA1C in the last 72 hours. CBG: No results for input(s): GLUCAP in the last 168 hours. Lipid Profile: No results for input(s): CHOL, HDL, LDLCALC, TRIG, CHOLHDL, LDLDIRECT in the last 72 hours. Thyroid Function Tests: No results for input(s): TSH, T4TOTAL, FREET4, T3FREE, THYROIDAB in the last 72 hours. Anemia Panel: No results for input(s): VITAMINB12, FOLATE, FERRITIN, TIBC, IRON, RETICCTPCT in the last 72 hours. Urine analysis:    Component Value Date/Time   COLORURINE YELLOW 01/07/2016 1200   APPEARANCEUR CLEAR 01/07/2016 1200   LABSPEC 1.020 01/07/2016 1200   PHURINE 6.0 01/07/2016 1200   GLUCOSEU NEGATIVE 01/07/2016 1200   HGBUR NEGATIVE 01/07/2016 1200   BILIRUBINUR NEGATIVE 01/07/2016 1200   KETONESUR NEGATIVE 01/07/2016 1200   UROBILINOGEN 0.2 01/07/2016 1200   NITRITE NEGATIVE 01/07/2016 1200   LEUKOCYTESUR NEGATIVE 01/07/2016 1200    Creatinine Clearance: Estimated Creatinine  Clearance: 65.1 mL/min (by C-G formula based on SCr of 1.33 mg/dL).  Sepsis Labs: @LABRCNTIP (procalcitonin:4,lacticidven:4) )No results found for this or any previous visit (from the past 240 hour(s)).   Radiological Exams on Admission: No results found.   Assessment/Plan Active Problems:   Hyperlipidemia   PULMONARY FIBROSIS   BPH associated with nocturia   Factor VIII inhibitor disorder (HCC)   GERD (gastroesophageal reflux disease)   Diabetes mellitus with complication (HCC)   CKD (chronic kidney disease)   Cellulitis   Hematoma   Chronic diastolic (congestive) heart failure (HCC)   LLE cellulitis/hematoma: History of factor VIII inhibitor dysfunction. Followed by oncology, Dr. Lindi Adie. Problem ongoing for more than one week. Started on IV rituximab and prednisone on 07/25/2016. Keflex for 24 hours with worsening symptoms. Discussed case w/ Dr. Lindi Adie. LE doppler w/o evidence of occlusive disease. Dopplers in ED w/ pulses present. WBC nml, AFVSS.  - Med surge - LLE elevation - Change ABX to Clinda (broaden to include pseudomonal coverage if not improving) - continue IV rituximab once weekly (last and first dose on 8/11. Next dose if here on Friday 8/18) ordered - Increase prednisone to 60mg  daily - Improved pain control - Consider H/O consult if condition worsens or Vascular if pt develops occlusive disease or compartment syndrome - Single Dose lasix 40mg  IV - Coags  COPD/Pulm fibrosis:at baseline. On chronic styeroids - continue pulmicort, albuterol PRN - Steroids as above  CKD: Cr 1.33. At baseline - BMET in am  Chronic systolic congestive heart failure: EF 40%. No evidence of acute decompensation - Continue home Lasix - Strict I's and O's, daily weights  HTN: - continue clonidine  GERD: - continue ppi  DM:  - Hold metformin - SSI  DVT prophylaxis: SCD  Code Status: FULL  Family Communication: none  Disposition Plan: pending iomprovmemtn  Consults  called: none  Admission status: Obs    Bedelia Pong J MD [facility]  If 7PM-7AM, please contact night-coverage [email or web address] Password [facility]  08/06/2016, 6:03 PM

## 2016-08-06 NOTE — Progress Notes (Addendum)
Pharmacy Antibiotic Note  Richard Hopkins is a 70 y.o. male admitted on 08/06/2016 with cellulitis.  He was seen at [facility] on 8/15 by Dr. Lindi Adie for progressive LLE swelling, redness, and pain associated with profound SQ ecchymosis and hemorrhage/hematoma due to Factor VIII inhibitor disorder and was started on Keflex on 8/15.  Dopper on 8/15 was negative for DVT in LLE.  Pharmacy has been consulted for Vancomycin dosing.  SCr 1.3 (near baseline) with CrCl ~ 65 ml/min CG, ~ 51 ml/min N  Plan:  Vancomycin 2g IV stat, then 1250 mg IV q24h.  Measure Vanc trough at steady state.  Follow up renal fxn, culture results, and clinical course.   Height: 6\' 3"  (190.5 cm) Weight: 226 lb (102.5 kg) IBW/kg (Calculated) : 84.5  Temp (24hrs), Avg:98.5 F (36.9 C), Min:98.5 F (36.9 C), Max:98.5 F (36.9 C)   Recent Labs Lab 08/06/16 1302  WBC 9.6  CREATININE 1.33*    Estimated Creatinine Clearance: 65.1 mL/min (by C-G formula based on SCr of 1.33 mg/dL).    Allergies  Allergen Reactions  . Doxycycline Nausea Only and Other (See Comments)    Loss of memory; "bad feeling," dizziness  . Ace Inhibitors Cough  . Cardura [Doxazosin Mesylate] Other (See Comments)    dizziness  . Cozaar [Losartan Potassium] Other (See Comments)    dizziness  . Dexilant [Dexlansoprazole] Itching and Rash  . Hydrocodone Nausea Only and Other (See Comments)    dizziness  . Lisinopril Cough  . Norvasc [Amlodipine Besylate] Other (See Comments)    3 times dizzy, bad taste in mouth, none since stopped  . Alfuzosin Rash    Antimicrobials this admission: 8/16 Vancomycin >>  Dose adjustments this admission:  Microbiology results:  None  Thank you for allowing pharmacy to be a part of this patient's care.  Gretta Arab PharmD, BCPS Pager [phone] 08/06/2016 4:02 PM

## 2016-08-06 NOTE — Telephone Encounter (Signed)
Call from ED for Provider.  Return number [phone] for Dr. Levan Hurst.  Collaborative notified.

## 2016-08-06 NOTE — ED Triage Notes (Signed)
Pt c/o increasing LLE swelling and discoloration x 1 week.  Pain score 10/10.  Pt reports that he was seen by Lindi Adie MD yesterday for same.  Sts he was sent for an [place] to r/o DVT and prescribed an antibiotic.  Hx of Factor VIII disorder and brain tumor.

## 2016-08-07 ENCOUNTER — Telehealth: Payer: Self-pay | Admitting: *Deleted

## 2016-08-07 ENCOUNTER — Ambulatory Visit: Payer: PPO | Admitting: Hematology and Oncology

## 2016-08-07 ENCOUNTER — Other Ambulatory Visit: Payer: PPO

## 2016-08-07 DIAGNOSIS — R233 Spontaneous ecchymoses: Secondary | ICD-10-CM

## 2016-08-07 DIAGNOSIS — D638 Anemia in other chronic diseases classified elsewhere: Secondary | ICD-10-CM

## 2016-08-07 DIAGNOSIS — D66 Hereditary factor VIII deficiency: Secondary | ICD-10-CM | POA: Diagnosis not present

## 2016-08-07 DIAGNOSIS — M79662 Pain in left lower leg: Secondary | ICD-10-CM | POA: Diagnosis not present

## 2016-08-07 DIAGNOSIS — Z7982 Long term (current) use of aspirin: Secondary | ICD-10-CM | POA: Diagnosis not present

## 2016-08-07 DIAGNOSIS — Z825 Family history of asthma and other chronic lower respiratory diseases: Secondary | ICD-10-CM | POA: Diagnosis not present

## 2016-08-07 DIAGNOSIS — R2242 Localized swelling, mass and lump, left lower limb: Secondary | ICD-10-CM

## 2016-08-07 DIAGNOSIS — D631 Anemia in chronic kidney disease: Secondary | ICD-10-CM | POA: Diagnosis not present

## 2016-08-07 DIAGNOSIS — R238 Other skin changes: Secondary | ICD-10-CM | POA: Diagnosis not present

## 2016-08-07 DIAGNOSIS — I130 Hypertensive heart and chronic kidney disease with heart failure and stage 1 through stage 4 chronic kidney disease, or unspecified chronic kidney disease: Secondary | ICD-10-CM | POA: Diagnosis not present

## 2016-08-07 DIAGNOSIS — M7989 Other specified soft tissue disorders: Secondary | ICD-10-CM | POA: Diagnosis not present

## 2016-08-07 DIAGNOSIS — J449 Chronic obstructive pulmonary disease, unspecified: Secondary | ICD-10-CM | POA: Diagnosis not present

## 2016-08-07 DIAGNOSIS — E1122 Type 2 diabetes mellitus with diabetic chronic kidney disease: Secondary | ICD-10-CM | POA: Diagnosis not present

## 2016-08-07 DIAGNOSIS — Z888 Allergy status to other drugs, medicaments and biological substances status: Secondary | ICD-10-CM | POA: Diagnosis not present

## 2016-08-07 DIAGNOSIS — L03116 Cellulitis of left lower limb: Principal | ICD-10-CM

## 2016-08-07 DIAGNOSIS — Z79899 Other long term (current) drug therapy: Secondary | ICD-10-CM | POA: Diagnosis not present

## 2016-08-07 DIAGNOSIS — K219 Gastro-esophageal reflux disease without esophagitis: Secondary | ICD-10-CM | POA: Diagnosis not present

## 2016-08-07 DIAGNOSIS — F17210 Nicotine dependence, cigarettes, uncomplicated: Secondary | ICD-10-CM | POA: Diagnosis not present

## 2016-08-07 DIAGNOSIS — I5042 Chronic combined systolic (congestive) and diastolic (congestive) heart failure: Secondary | ICD-10-CM | POA: Diagnosis not present

## 2016-08-07 DIAGNOSIS — J8410 Pulmonary fibrosis, unspecified: Secondary | ICD-10-CM | POA: Diagnosis not present

## 2016-08-07 DIAGNOSIS — I429 Cardiomyopathy, unspecified: Secondary | ICD-10-CM | POA: Diagnosis not present

## 2016-08-07 DIAGNOSIS — N183 Chronic kidney disease, stage 3 (moderate): Secondary | ICD-10-CM

## 2016-08-07 DIAGNOSIS — Z833 Family history of diabetes mellitus: Secondary | ICD-10-CM | POA: Diagnosis not present

## 2016-08-07 DIAGNOSIS — Z881 Allergy status to other antibiotic agents status: Secondary | ICD-10-CM | POA: Diagnosis not present

## 2016-08-07 DIAGNOSIS — R351 Nocturia: Secondary | ICD-10-CM | POA: Diagnosis not present

## 2016-08-07 DIAGNOSIS — Z7984 Long term (current) use of oral hypoglycemic drugs: Secondary | ICD-10-CM | POA: Diagnosis not present

## 2016-08-07 DIAGNOSIS — Z7952 Long term (current) use of systemic steroids: Secondary | ICD-10-CM | POA: Diagnosis not present

## 2016-08-07 DIAGNOSIS — E785 Hyperlipidemia, unspecified: Secondary | ICD-10-CM | POA: Diagnosis not present

## 2016-08-07 DIAGNOSIS — N401 Enlarged prostate with lower urinary tract symptoms: Secondary | ICD-10-CM | POA: Diagnosis not present

## 2016-08-07 LAB — CBC
HEMATOCRIT: 35.9 % — AB (ref 39.0–52.0)
HEMOGLOBIN: 11 g/dL — AB (ref 13.0–17.0)
MCH: 27.7 pg (ref 26.0–34.0)
MCHC: 30.6 g/dL (ref 30.0–36.0)
MCV: 90.4 fL (ref 78.0–100.0)
Platelets: 276 10*3/uL (ref 150–400)
RBC: 3.97 MIL/uL — AB (ref 4.22–5.81)
RDW: 17 % — ABNORMAL HIGH (ref 11.5–15.5)
WBC: 8.6 10*3/uL (ref 4.0–10.5)

## 2016-08-07 LAB — BASIC METABOLIC PANEL
ANION GAP: 9 (ref 5–15)
BUN: 23 mg/dL — ABNORMAL HIGH (ref 6–20)
CHLORIDE: 102 mmol/L (ref 101–111)
CO2: 29 mmol/L (ref 22–32)
Calcium: 8.4 mg/dL — ABNORMAL LOW (ref 8.9–10.3)
Creatinine, Ser: 1.35 mg/dL — ABNORMAL HIGH (ref 0.61–1.24)
GFR calc Af Amer: 59 mL/min — ABNORMAL LOW (ref >60)
GFR, EST NON AFRICAN AMERICAN: 51 mL/min — AB (ref >60)
GLUCOSE: 91 mg/dL (ref 65–99)
POTASSIUM: 3.6 mmol/L (ref 3.5–5.1)
Sodium: 140 mmol/L (ref 135–145)

## 2016-08-07 LAB — GLUCOSE, CAPILLARY
GLUCOSE-CAPILLARY: 129 mg/dL — AB (ref 65–99)
GLUCOSE-CAPILLARY: 194 mg/dL — AB (ref 65–99)
Glucose-Capillary: 88 mg/dL (ref 65–99)
Glucose-Capillary: 224 mg/dL — ABNORMAL HIGH (ref 65–99)

## 2016-08-07 NOTE — Progress Notes (Signed)
HEMATOLOGY-ONCOLOGY PROGRESS NOTE  SUBJECTIVE: Severe redness and swelling left leg causing pain  OBJECTIVE: REVIEW OF SYSTEMS:   Constitutional: Denies fevers, chills or abnormal weight loss Eyes: Denies blurriness of vision Ears, nose, mouth, throat, and face: Denies mucositis or sore throat Respiratory: Denies cough, dyspnea or wheezes Cardiovascular: Denies palpitation, chest discomfort Gastrointestinal:  Denies nausea, heartburn or change in bowel habits Skin: redness  Lymphatics: Denies new lymphadenopathy or easy bruising Neurological:Denies numbness, tingling or new weaknesses Behavioral/Psych: Mood is stable, no new changes  Extremities: swelling of left leg with redness and tenderness  All other systems were reviewed with the patient and are negative.  I have reviewed the past medical history, past surgical history, social history and family history with the patient and they are unchanged from previous note.   PHYSICAL EXAMINATION: ECOG PERFORMANCE STATUS: 2 - Symptomatic, <50% confined to bed  Vitals:   08/07/16 0533 08/07/16 1302  BP: (!) 168/91 (!) 142/86  Pulse: 83 96  Resp: 20 16  Temp: 99.2 F (37.3 C) 99 F (37.2 C)   Filed Weights   08/06/16 1137 08/07/16 0533  Weight: 226 lb (102.5 kg) 216 lb 11.4 oz (98.3 kg)    GENERAL:alert, no distress and comfortable SKIN: skin color, texture, turgor are normal, no rashes or significant lesions EYES: normal, Conjunctiva are pink and non-injected, sclera clear OROPHARYNX:no exudate, no erythema and lips, buccal mucosa, and tongue normal  NECK: supple, thyroid normal size, non-tender, without nodularity LYMPH:  no palpable lymphadenopathy in the cervical, axillary or inguinal LUNGS: clear to auscultation and percussion with normal breathing effort HEART: regular rate & rhythm and no murmurs and no lower extremity edema ABDOMEN:abdomen soft, non-tender and normal bowel sounds Musculoskeletal:left leg tenderness,  pulses present  NEURO: alert & oriented x 3 with fluent speech, no focal motor/sensory deficits  LABORATORY DATA:  I have reviewed the data as listed CMP Latest Ref Rng & Units 08/07/2016 08/06/2016 07/25/2016  Glucose 65 - 99 mg/dL 91 87 96  BUN 6 - 20 mg/dL 23(H) 20 16.6  Creatinine 0.61 - 1.24 mg/dL 1.35(H) 1.33(H) 1.3  Sodium 135 - 145 mmol/L 140 138 140  Potassium 3.5 - 5.1 mmol/L 3.6 3.5 4.1  Chloride 101 - 111 mmol/L 102 102 -  CO2 22 - 32 mmol/L 29 29 23   Calcium 8.9 - 10.3 mg/dL 8.4(L) 8.7(L) 9.0  Total Protein 6.4 - 8.3 g/dL - - 6.6  Total Bilirubin 0.20 - 1.20 mg/dL - - 0.64  Alkaline Phos 40 - 150 U/L - - 59  AST 5 - 34 U/L - - 12  ALT 0 - 55 U/L - - 11    Lab Results  Component Value Date   WBC 8.6 08/07/2016   HGB 11.0 (L) 08/07/2016   HCT 35.9 (L) 08/07/2016   MCV 90.4 08/07/2016   PLT 276 08/07/2016   NEUTROABS 6.6 08/06/2016    ASSESSMENT AND PLAN: 1. Left leg ecchymosis and hemorrhage: due to factor 8 inhibitor S/P rituxan 1 week ago. It improved at the thigh but got worse in the distal part of his leg to the point that we were concerned for compartment syndrome. U/S did not show DVT. After being admitted and being treated with empiric antibiotics, his leg swelling improved. Pain still present but slowly getting better  Will postpone tomorrows rituxan to next week. If his symptoms resolve, we may not do any more rituxan treatments. Thanks

## 2016-08-07 NOTE — Progress Notes (Signed)
Patient ID: Richard Hopkins, male   DOB: 1946-06-01, 70 y.o.   MRN: [id number]  PROGRESS NOTE    Richard Hopkins  [id number] DOB: 01/11/1946 DOA: 08/06/2016  PCP: Cathlean Cower, MD   Brief Narrative:  70 y.o. male with past medical history significant of BPH, COPD, GERD, HLD, HTN, pulmonary fibrosis on chronic steroids who presented to [facility] ED after several days of constant progressive left lower extremity swelling and pain associated with redness. Patient has known history of factor VIII inhibitor disorder. Patient was seen in hematology office for cellulitis and was given Keflex however his symptoms have not subsided. He has been on rituximab and prednisone about one week prior to this admission. Patient was started on empiric clindamycin for treatment of cellulitis.   Assessment & Plan:   Active Problems: Left lower extremity cellulitis - Started empirically on clindamycin - Continue to monitor for clinical improvement - Patient advised to keep leg elevated throughout the day as much as he can which may help alleviate swelling - Lower extremity Doppler negative for DVT    PULMONARY FIBROSIS - Stable  - Continue prednisone 60 mg daily    Essential hypertension - Continue clonidine 0.1 mg 3 times a day    Diabetes mellitus without complications without long-term insulin use -Patient is on metformin at home but in hospital on sliding scale insulin     GERD (gastroesophageal reflux disease) - Continue protonix     Chronic kidney disease, stage III - Recent baseline creatinine 1.39 - Creatinine on this admission at baseline values    Anemia of chronic kidney disease - Hemoglobin stable at 11    DVT prophylaxis: SCD's bilaterally  Code Status: full code  Family Communication: No family at the bedside Disposition Plan: Home once cellulitis improves   Consultants:   None   Procedures:   LE doppler 08/06/2016 --> No evidence of DVT in LLE  Antimicrobials:    Clindamycin 08/06/2016 -->    Subjective: No overnight events.   Objective: Vitals:   08/06/16 1700 08/06/16 1800 08/06/16 2313 08/07/16 0533  BP: 178/96 161/78 (!) 145/79 (!) 168/91  Pulse: 67 68 70 83  Resp:  16 16 20   Temp:   98.5 F (36.9 C) 99.2 F (37.3 C)  TempSrc:   Oral Oral  SpO2: 95% 98% 96% 95%  Weight:    98.3 kg (216 lb 11.4 oz)  Height:        Intake/Output Summary (Last 24 hours) at 08/07/16 1223 Last data filed at 08/07/16 1000  Gross per 24 hour  Intake              450 ml  Output             1900 ml  Net            -1450 ml   Filed Weights   08/06/16 1137 08/07/16 0533  Weight: 102.5 kg (226 lb) 98.3 kg (216 lb 11.4 oz)    Examination:  General exam: Appears calm and comfortable  Respiratory system: Clear to auscultation. Respiratory effort normal. Cardiovascular system: S1 & S2 heard, RRR. No JVD, murmurs, rubs, gallops or clicks. No pedal edema. Gastrointestinal system: Abdomen is nondistended, soft and nontender. No organomegaly or masses felt. Normal bowel sounds heard. Central nervous system: Alert and oriented. No focal neurological deficits. Extremities: LLE redness and slight edema, some tenderness to palpation  Skin: No rashes, lesions or ulcers Psychiatry: Judgement and insight appear normal. Mood & affect  appropriate.   Data Reviewed: I have personally reviewed following labs and imaging studies  CBC:  Recent Labs Lab 08/06/16 1302 08/07/16 0403  WBC 9.6 8.6  NEUTROABS 6.6  --   HGB 12.1* 11.0*  HCT 38.6* 35.9*  MCV 89.1 90.4  PLT 316 [id number]   Basic Metabolic Panel:  Recent Labs Lab 08/06/16 1302 08/07/16 0403  NA 138 140  K 3.5 3.6  CL 102 102  CO2 29 29  GLUCOSE 87 91  BUN 20 23*  CREATININE 1.33* 1.35*  CALCIUM 8.7* 8.4*   GFR: Estimated Creatinine Clearance: 59.1 mL/min (by C-G formula based on SCr of 1.35 mg/dL). Liver Function Tests: No results for input(s): AST, ALT, ALKPHOS, BILITOT, PROT, ALBUMIN in  the last 168 hours. No results for input(s): LIPASE, AMYLASE in the last 168 hours. No results for input(s): AMMONIA in the last 168 hours. Coagulation Profile: No results for input(s): INR, PROTIME in the last 168 hours. Cardiac Enzymes: No results for input(s): CKTOTAL, CKMB, CKMBINDEX, TROPONINI in the last 168 hours. BNP (last 3 results) No results for input(s): PROBNP in the last 8760 hours. HbA1C: No results for input(s): HGBA1C in the last 72 hours. CBG:  Recent Labs Lab 08/06/16 2305 08/07/16 0809 08/07/16 1201  GLUCAP 123* 88 129*   Lipid Profile: No results for input(s): CHOL, HDL, LDLCALC, TRIG, CHOLHDL, LDLDIRECT in the last 72 hours. Thyroid Function Tests: No results for input(s): TSH, T4TOTAL, FREET4, T3FREE, THYROIDAB in the last 72 hours. Anemia Panel: No results for input(s): VITAMINB12, FOLATE, FERRITIN, TIBC, IRON, RETICCTPCT in the last 72 hours. Urine analysis:    Component Value Date/Time   COLORURINE YELLOW 01/07/2016 1200   APPEARANCEUR CLEAR 01/07/2016 1200   LABSPEC 1.020 01/07/2016 1200   PHURINE 6.0 01/07/2016 1200   GLUCOSEU NEGATIVE 01/07/2016 1200   HGBUR NEGATIVE 01/07/2016 1200   BILIRUBINUR NEGATIVE 01/07/2016 1200   KETONESUR NEGATIVE 01/07/2016 1200   UROBILINOGEN 0.2 01/07/2016 1200   NITRITE NEGATIVE 01/07/2016 1200   LEUKOCYTESUR NEGATIVE 01/07/2016 1200   Sepsis Labs: @LABRCNTIP (procalcitonin:4,lacticidven:4)   )No results found for this or any previous visit (from the past 240 hour(s)).    Radiology Studies: No results found.      Scheduled Meds: . aspirin EC  81 mg Oral Daily  . clindamycin (CLEOCIN) IV  600 mg Intravenous Q8H  . cloNIDine  0.1 mg Oral TID  . furosemide  40 mg Oral Daily  . insulin aspart  0-5 Units Subcutaneous QHS  . insulin aspart  0-9 Units Subcutaneous TID WC  . pantoprazole  80 mg Oral Daily  . predniSONE  60 mg Oral Q breakfast  . sodium chloride flush  3 mL Intravenous Q12H    Continuous Infusions:    LOS: 0 days    Time spent: 25 minutes  Greater than 50% of the time spent on counseling and coordinating the care.   Leisa Lenz, MD [facility] Pager [phone]  If 7PM-7AM, please contact night-coverage [email or web address] Password [facility] 08/07/2016, 12:23 PM

## 2016-08-07 NOTE — Telephone Encounter (Signed)
Pt called to make sure Dr. Lindi Adie knew he was in the hospital.  Cancelled appts for tomorrow 8/18.  Made Dr. Lindi Adie aware and also that the pt requested for him to come see him if he could.

## 2016-08-08 ENCOUNTER — Other Ambulatory Visit: Payer: PPO

## 2016-08-08 ENCOUNTER — Ambulatory Visit: Payer: PPO | Admitting: Hematology and Oncology

## 2016-08-08 ENCOUNTER — Ambulatory Visit: Payer: PPO

## 2016-08-08 DIAGNOSIS — R269 Unspecified abnormalities of gait and mobility: Secondary | ICD-10-CM | POA: Diagnosis not present

## 2016-08-08 LAB — GLUCOSE, CAPILLARY
GLUCOSE-CAPILLARY: 121 mg/dL — AB (ref 65–99)
GLUCOSE-CAPILLARY: 112 mg/dL — AB (ref 65–99)

## 2016-08-08 MED ORDER — CLINDAMYCIN HCL 300 MG PO CAPS: 300.0000 mg | ORAL_CAPSULE | Freq: Three times a day (TID) | ORAL | 0 refills | Status: DC

## 2016-08-08 MED ORDER — TRAMADOL HCL 50 MG PO TABS: 50.0000 mg | ORAL_TABLET | Freq: Four times a day (QID) | ORAL | 0 refills | Status: DC | PRN

## 2016-08-08 MED ORDER — SACCHAROMYCES BOULARDII 250 MG PO CAPS: 250.0000 mg | ORAL_CAPSULE | Freq: Two times a day (BID) | ORAL | 0 refills | Status: DC

## 2016-08-08 NOTE — Progress Notes (Signed)
Pt discharged home in stable condition. Discharge instructions and scripts given. Pt verbalized understanding. No immediate question or concerns at this time.

## 2016-08-08 NOTE — Progress Notes (Signed)
This CM was contacted by RN. Pt requesting RW for home. Order received and [facility] DME rep contacted for RW. No other CM needs communicated. Marney Doctor RN,BSN,NCM [phone]

## 2016-08-08 NOTE — Discharge Instructions (Signed)

## 2016-08-08 NOTE — Discharge Summary (Signed)
Physician Discharge Summary  Richard Hopkins [id number] DOB: Sep 26, 1946 DOA: 08/06/2016  PCP: Cathlean Cower, MD  Admit date: 08/06/2016 Discharge date: 08/08/2016  Recommendations for Outpatient Follow-up:  1. Patient wants to go home today. He will continue clindamycin for 12 days on discharge.  Discharge Diagnoses:  Active Problems:   Hyperlipidemia   PULMONARY FIBROSIS   BPH associated with nocturia   Factor VIII inhibitor disorder (HCC)   GERD (gastroesophageal reflux disease)   Diabetes mellitus with complication (HCC)   CKD (chronic kidney disease)   Cellulitis   Hematoma   Chronic diastolic (congestive) heart failure (HCC)    Discharge Condition: stable   Diet recommendation: as tolerated   History of present illness:  70 y.o.malewith past medical history significant of BPH, COPD, GERD, HLD, HTN, pulmonary fibrosis on chronic steroids who presented to [facility] ED after several days of constant progressive left lower extremity swelling and pain associated with redness. Patient has known history of factor VIII inhibitor disorder. Patient was seen in hematology office for cellulitis and was given Keflex however his symptoms have not subsided. He has been on rituximab and prednisone about one week prior to this admission. Patient was started on empiric clindamycin for treatment of cellulitis.  Hospital Course:     Assessment & Plan:   Active Problems: Left lower extremity cellulitis - Started empirically on clindamycin - Lower extremity Doppler negative for DVT - Patient wants to go home today so we'll continue clindamycin for 12 days and discharge    PULMONARY FIBROSIS - Continue prednisone 60 mg daily    Essential hypertension - Continue clonidine 0.1 mg 3 times a day    Diabetes mellitus without complications without long-term insulin use - Patient was on metformin at home but in hospital on sliding scale insulin     GERD (gastroesophageal reflux  disease) - Continue protonix     Chronic kidney disease, stage III - Recent baseline creatinine 1.39 - Creatinine on this admission at baseline values    Anemia of chronic kidney disease - Hemoglobin stable     DVT prophylaxis: SCD's bilaterally  Code Status: full code  Family Communication: No family at the bedside    Consultants:   None   Procedures:   LE doppler 08/06/2016 --> No evidence of DVT in LLE  Antimicrobials:   Clindamycin 08/06/2016 --> for 12 days on discharge    Signed:  Leisa Lenz, MD  [facility] 08/08/2016, 11:46 AM  Pager #: [phone]  Time spent in minutes:less than 30 minutes   Discharge Exam: Vitals:   08/07/16 2137 08/08/16 0546  BP: (!) 152/94 (!) 170/89  Pulse: 94 68  Resp: 18 18  Temp: 98.2 F (36.8 C) 98 F (36.7 C)   Vitals:   08/07/16 1302 08/07/16 2137 08/08/16 0546 08/08/16 0602  BP: (!) 142/86 (!) 152/94 (!) 170/89   Pulse: 96 94 68   Resp: 16 18 18    Temp: 99 F (37.2 C) 98.2 F (36.8 C) 98 F (36.7 C)   TempSrc: Oral Oral Oral   SpO2: 94% 95% 95%   Weight:    99.7 kg (219 lb 12.8 oz)  Height:        General: Pt is alert, follows commands appropriately, not in acute distress Cardiovascular: Regular rate and rhythm, S1/S2 +, no murmurs Respiratory: Clear to auscultation bilaterally, no wheezing, no crackles, no rhonchi Abdominal: Soft, non tender, non distended, bowel sounds +, no guarding Extremities: LLE cellulitis better, edema much improved,  no cyanosis, pulses palpable bilaterally DP and PT Neuro: Grossly nonfocal  Discharge Instructions  Discharge Instructions    Call MD for:  difficulty breathing, headache or visual disturbances    Complete by:  As directed   Call MD for:  hives    Complete by:  As directed   Call MD for:  redness, tenderness, or signs of infection (pain, swelling, redness, odor or green/yellow discharge around incision site)    Complete by:  As directed   Call MD  for:  severe uncontrolled pain    Complete by:  As directed   Diet - low sodium heart healthy    Complete by:  As directed   Discharge instructions    Complete by:  As directed      Increase activity slowly    Complete by:  As directed       Medication List    STOP taking these medications   cephALEXin 500 MG capsule Commonly known as:  KEFLEX     TAKE these medications   albuterol 108 (90 Base) MCG/ACT inhaler Commonly known as:  PROAIR HFA 2 puffs every 4 hours as needed only  if your can't catch your breath What changed:  how much to take  how to take this  when to take this  reasons to take this  additional instructions   aspirin 81 MG tablet Take 81 mg by mouth daily.   budesonide 0.25 MG/2ML nebulizer solution Commonly known as:  PULMICORT Take 2 mLs (0.25 mg total) by nebulization 2 (two) times daily. DX: J44.9   clindamycin 300 MG capsule Commonly known as:  CLEOCIN Take 1 capsule (300 mg total) by mouth 3 (three) times daily.   cloNIDine 0.1 MG tablet Commonly known as:  CATAPRES Take 1 tablet (0.1 mg total) by mouth 3 (three) times daily.   formoterol 20 MCG/2ML nebulizer solution Commonly known as:  PERFOROMIST Take 2 mLs (20 mcg total) by nebulization 2 (two) times daily. DX: J44.9   furosemide 20 MG tablet Commonly known as:  LASIX Take 2 tablets (40 mg total) by mouth daily.   ipratropium-albuterol 0.5-2.5 (3) MG/3ML Soln Commonly known as:  DUONEB Take 3 mLs by nebulization every 6 (six) hours as needed (wheezing/shortness of breath).   lovastatin 20 MG tablet Commonly known as:  MEVACOR TAKE TWO TABLETS BY MOUTH ONCE DAILY IN THE EVENING   metFORMIN 500 MG tablet Commonly known as:  GLUCOPHAGE TAKE ONE TABLET BY MOUTH TWICE DAILY WITH  A  MEAL What changed:  how much to take  how to take this  when to take this  additional instructions   omeprazole 40 MG capsule Commonly known as:  PRILOSEC Take 1 capsule by mouth every  afternoon. What changed:  how much to take  how to take this  when to take this  additional instructions   predniSONE 20 MG tablet Commonly known as:  DELTASONE Take 1 tablet (20 mg total) by mouth daily with breakfast. What changed:  Another medication with the same name was removed. Continue taking this medication, and follow the directions you see here.   saccharomyces boulardii 250 MG capsule Commonly known as:  FLORASTOR Take 1 capsule (250 mg total) by mouth 2 (two) times daily.   tamsulosin 0.4 MG Caps capsule Commonly known as:  FLOMAX Take 1 capsule (0.4 mg total) by mouth daily.   varenicline 1 MG tablet Commonly known as:  CHANTIX CONTINUING MONTH PAK Take 1 tablet (1 mg total)  by mouth 2 (two) times daily. What changed:  Another medication with the same name was removed. Continue taking this medication, and follow the directions you see here.      Follow-up Information    Cathlean Cower, MD. Schedule an appointment as soon as possible for a visit in 1 week(s).   Specialties:  Internal Medicine, Radiology Contact information: [place] [address] [phone]            The results of significant diagnostics from this hospitalization (including imaging, microbiology, ancillary and laboratory) are listed below for reference.    Significant Diagnostic Studies: No results found.  Microbiology: No results found for this or any previous visit (from the past 240 hour(s)).   Labs: Basic Metabolic Panel:  Recent Labs Lab 08/06/16 1302 08/07/16 0403  NA 138 140  K 3.5 3.6  CL 102 102  CO2 29 29  GLUCOSE 87 91  BUN 20 23*  CREATININE 1.33* 1.35*  CALCIUM 8.7* 8.4*   Liver Function Tests: No results for input(s): AST, ALT, ALKPHOS, BILITOT, PROT, ALBUMIN in the last 168 hours. No results for input(s): LIPASE, AMYLASE in the last 168 hours. No results for input(s): AMMONIA in the last 168 hours. CBC:  Recent Labs Lab  08/06/16 1302 08/07/16 0403  WBC 9.6 8.6  NEUTROABS 6.6  --   HGB 12.1* 11.0*  HCT 38.6* 35.9*  MCV 89.1 90.4  PLT 316 276   Cardiac Enzymes: No results for input(s): CKTOTAL, CKMB, CKMBINDEX, TROPONINI in the last 168 hours. BNP: BNP (last 3 results) No results for input(s): BNP in the last 8760 hours.  ProBNP (last 3 results) No results for input(s): PROBNP in the last 8760 hours.  CBG:  Recent Labs Lab 08/07/16 1201 08/07/16 1740 08/07/16 2200 08/08/16 0724 08/08/16 1113  GLUCAP 129* 194* 224* 121* 112*

## 2016-08-15 ENCOUNTER — Ambulatory Visit (HOSPITAL_BASED_OUTPATIENT_CLINIC_OR_DEPARTMENT_OTHER): Payer: PPO

## 2016-08-15 ENCOUNTER — Ambulatory Visit (HOSPITAL_BASED_OUTPATIENT_CLINIC_OR_DEPARTMENT_OTHER): Payer: PPO | Admitting: Hematology and Oncology

## 2016-08-15 ENCOUNTER — Other Ambulatory Visit (HOSPITAL_COMMUNITY)
Admission: RE | Admit: 2016-08-15 | Discharge: 2016-08-15 | Disposition: A | Discharge status: Home / Self Care | Payer: PPO | Source: Other Acute Inpatient Hospital | Attending: Hematology and Oncology | Admitting: Hematology and Oncology

## 2016-08-15 ENCOUNTER — Encounter: Payer: Self-pay | Admitting: Hematology and Oncology

## 2016-08-15 ENCOUNTER — Other Ambulatory Visit: Payer: Self-pay | Admitting: Medical Oncology

## 2016-08-15 VITALS — BP 176/84 | HR 64 | Resp 18

## 2016-08-15 DIAGNOSIS — M79604 Pain in right leg: Secondary | ICD-10-CM

## 2016-08-15 DIAGNOSIS — D68318 Other hemorrhagic disorder due to intrinsic circulating anticoagulants, antibodies, or inhibitors: Secondary | ICD-10-CM

## 2016-08-15 DIAGNOSIS — M79605 Pain in left leg: Secondary | ICD-10-CM

## 2016-08-15 DIAGNOSIS — Z5112 Encounter for antineoplastic immunotherapy: Secondary | ICD-10-CM

## 2016-08-15 DIAGNOSIS — R233 Spontaneous ecchymoses: Secondary | ICD-10-CM

## 2016-08-15 DIAGNOSIS — M25572 Pain in left ankle and joints of left foot: Secondary | ICD-10-CM

## 2016-08-15 LAB — PLATELET FUNCTION ASSAY: Collagen / Epinephrine: 183 seconds (ref 0–193)

## 2016-08-15 MED ORDER — SODIUM CHLORIDE 0.9 % IV SOLN
375.0000 mg/m2 | Freq: Once | INTRAVENOUS | Status: AC
  Administered 2016-08-15: 900 mg via INTRAVENOUS
  Filled 2016-08-15: qty 50

## 2016-08-15 MED ORDER — OXYCODONE-ACETAMINOPHEN 5-325 MG PO TABS
ORAL_TABLET | ORAL | Status: AC
  Filled 2016-08-15: qty 1

## 2016-08-15 MED ORDER — DIPHENHYDRAMINE HCL 25 MG PO CAPS
50.0000 mg | ORAL_CAPSULE | Freq: Once | ORAL | Status: AC
  Administered 2016-08-15: 50 mg via ORAL

## 2016-08-15 MED ORDER — GABAPENTIN 300 MG PO CAPS: 300.0000 mg | ORAL_CAPSULE | Freq: Every day | ORAL | 2 refills | Status: DC

## 2016-08-15 MED ORDER — ACETAMINOPHEN 325 MG PO TABS
650.0000 mg | ORAL_TABLET | Freq: Once | ORAL | Status: AC
  Administered 2016-08-15: 650 mg via ORAL

## 2016-08-15 MED ORDER — SODIUM CHLORIDE 0.9 % IV SOLN
Freq: Once | INTRAVENOUS | Status: AC
  Administered 2016-08-15: 10:00:00 via INTRAVENOUS

## 2016-08-15 MED ORDER — DIPHENHYDRAMINE HCL 25 MG PO CAPS
ORAL_CAPSULE | ORAL | Status: AC
  Filled 2016-08-15: qty 2

## 2016-08-15 MED ORDER — SODIUM CHLORIDE 0.9 % IV SOLN: 375.0000 mg/m2 | Freq: Once | INTRAVENOUS | Status: DC

## 2016-08-15 MED ORDER — ACETAMINOPHEN 325 MG PO TABS
ORAL_TABLET | ORAL | Status: AC
  Filled 2016-08-15: qty 2

## 2016-08-15 MED ORDER — OXYCODONE-ACETAMINOPHEN 5-325 MG PO TABS: 1.0000 | ORAL_TABLET | Freq: Four times a day (QID) | ORAL | 0 refills | Status: DC | PRN

## 2016-08-15 MED ORDER — OXYCODONE-ACETAMINOPHEN 5-325 MG PO TABS
1.0000 | ORAL_TABLET | ORAL | Status: AC
  Administered 2016-08-15: 1 via ORAL

## 2016-08-15 NOTE — Assessment & Plan Note (Addendum)
Factor VIII inhibitor: Massive hematoma/ecchymosis left lower extremity spontaneous Previously caused profound hemorrhage into the skin and subcutaneous tissues in 2016. He was then treated with prednisone and rituximab. This led to complete resolution of his symptoms. 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  Profound leg swelling with pain and redness: It had improved with antibiotics, diuretics. Ultrasound of the leg was negative for blood clots.

## 2016-08-15 NOTE — Progress Notes (Signed)
Patient Care Team: Biagio Borg, MD as PCP - General  DIAGNOSIS: Factor VIII inhibitor with severe bruising  CHIEF COMPLIANT: Leg swelling and burning pain  INTERVAL HISTORY: Richard Hopkins is a 70 year old gentleman with factor VIII inhibitor presented with profound ecchymosis of his left leg. He has a diagnosis of factor VIII inhibitor. This was confirmed by repeat testing. He was started on rituximab. He was admitted to the hospital with profound leg swelling and pain. He was treated with diuretics and antibiotics and foot elevation. His symptoms improved and was discharged home. As he is able to walk more his leg swelling slightly got worse. It is still extremely red and purplish discoloration of his foot. There are pulses are palpable. His pain has become significant that Ultram is not working anymore.  REVIEW OF SYSTEMS:   Constitutional: Denies fevers, chills or abnormal weight loss Eyes: Denies blurriness of vision Ears, nose, mouth, throat, and face: Denies mucositis or sore throat Respiratory: Denies cough, dyspnea or wheezes Cardiovascular: Denies palpitation, chest discomfort Gastrointestinal:  Denies nausea, heartburn or change in bowel habits Skin: Denies abnormal skin rashes Lymphatics: Denies new lymphadenopathy or easy bruising Neurological:Denies numbness, tingling or new weaknesses Behavioral/Psych: Mood is stable, no new changes  Extremities: Profound erythema and edema of the left lower extremity. All other systems were reviewed with the patient and are negative.  I have reviewed the past medical history, past surgical history, social history and family history with the patient and they are unchanged from previous note.  ALLERGIES:  is allergic to doxycycline; ace inhibitors; cardura [doxazosin mesylate]; cozaar [losartan potassium]; dexilant [dexlansoprazole]; hydrocodone; lisinopril; norvasc [amlodipine besylate]; and alfuzosin.  MEDICATIONS:  Current  Outpatient Prescriptions  Medication Sig Dispense Refill  . albuterol (PROAIR HFA) 108 (90 BASE) MCG/ACT inhaler 2 puffs every 4 hours as needed only  if your can't catch your breath (Patient taking differently: Inhale 1-2 puffs into the lungs every 4 (four) hours as needed for wheezing or shortness of breath. ) 1 Inhaler 11  . budesonide (PULMICORT) 0.25 MG/2ML nebulizer solution Take 2 mLs (0.25 mg total) by nebulization 2 (two) times daily. DX: J44.9 (Patient not taking: Reported on 08/06/2016) 120 mL 11  . clindamycin (CLEOCIN) 300 MG capsule Take 1 capsule (300 mg total) by mouth 3 (three) times daily. 36 capsule 0  . cloNIDine (CATAPRES) 0.1 MG tablet Take 1 tablet (0.1 mg total) by mouth 3 (three) times daily. 270 tablet 1  . formoterol (PERFOROMIST) 20 MCG/2ML nebulizer solution Take 2 mLs (20 mcg total) by nebulization 2 (two) times daily. DX: J44.9 (Patient not taking: Reported on 08/06/2016) 120 mL 11  . furosemide (LASIX) 20 MG tablet Take 2 tablets (40 mg total) by mouth daily. 60 tablet 2  . gabapentin (NEURONTIN) 300 MG capsule Take 1 capsule (300 mg total) by mouth at bedtime. 30 capsule 2  . ipratropium-albuterol (DUONEB) 0.5-2.5 (3) MG/3ML SOLN Take 3 mLs by nebulization every 6 (six) hours as needed (wheezing/shortness of breath). 60 mL 11  . lovastatin (MEVACOR) 20 MG tablet TAKE TWO TABLETS BY MOUTH ONCE DAILY IN THE EVENING (Patient not taking: Reported on 08/06/2016) 180 tablet 1  . metFORMIN (GLUCOPHAGE) 500 MG tablet TAKE ONE TABLET BY MOUTH TWICE DAILY WITH  A  MEAL (Patient taking differently: Take 500 mg by mouth 2 (two) times daily with a meal. ) 180 tablet 3  . omeprazole (PRILOSEC) 40 MG capsule Take 1 capsule by mouth every afternoon. (Patient taking differently: Take  40 mg by mouth daily. ) 30 capsule 6  . oxyCODONE-acetaminophen (PERCOCET/ROXICET) 5-325 MG tablet Take 1 tablet by mouth every 6 (six) hours as needed for severe pain. 60 tablet 0  . predniSONE (DELTASONE) 20  MG tablet Take 1 tablet (20 mg total) by mouth daily with breakfast. 60 tablet 1  . saccharomyces boulardii (FLORASTOR) 250 MG capsule Take 1 capsule (250 mg total) by mouth 2 (two) times daily. 24 capsule 0  . tamsulosin (FLOMAX) 0.4 MG CAPS capsule Take 1 capsule (0.4 mg total) by mouth daily. (Patient not taking: Reported on 08/06/2016) 30 capsule 3  . varenicline (CHANTIX CONTINUING MONTH PAK) 1 MG tablet Take 1 tablet (1 mg total) by mouth 2 (two) times daily. (Patient not taking: Reported on 08/06/2016) 60 tablet 1   No current facility-administered medications for this visit.     PHYSICAL EXAMINATION: ECOG PERFORMANCE STATUS: 1 - Symptomatic but completely ambulatory  Vitals:   08/15/16 0850  BP: (!) 165/92  Pulse: 72  Resp: 19  Temp: 97.8 F (36.6 C)   Filed Weights   08/15/16 0850  Weight: 217 lb 11.2 oz (98.7 kg)    GENERAL:alert, no distress and comfortable SKIN: skin color, texture, turgor are normal, no rashes or significant lesions EYES: normal, Conjunctiva are pink and non-injected, sclera clear OROPHARYNX:no exudate, no erythema and lips, buccal mucosa, and tongue normal  NECK: supple, thyroid normal size, non-tender, without nodularity LYMPH:  no palpable lymphadenopathy in the cervical, axillary or inguinal LUNGS: clear to auscultation and percussion with normal breathing effort HEART: regular rate & rhythm and no murmurs and no lower extremity edema ABDOMEN:abdomen soft, non-tender and normal bowel sounds MUSCULOSKELETAL:no cyanosis of digits and no clubbing  NEURO: alert & oriented x 3 with fluent speech, no focal motor/sensory deficits EXTREMITIES: No lower extremity edema  LABORATORY DATA:  I have reviewed the data as listed   Chemistry      Component Value Date/Time   NA 140 08/07/2016 0403   NA 140 07/25/2016 1025   K 3.6 08/07/2016 0403   K 4.1 07/25/2016 1025   CL 102 08/07/2016 0403   CO2 29 08/07/2016 0403   CO2 23 07/25/2016 1025   BUN 23  (H) 08/07/2016 0403   BUN 16.6 07/25/2016 1025   CREATININE 1.35 (H) 08/07/2016 0403   CREATININE 1.3 07/25/2016 1025      Component Value Date/Time   CALCIUM 8.4 (L) 08/07/2016 0403   CALCIUM 9.0 07/25/2016 1025   ALKPHOS 59 07/25/2016 1025   AST 12 07/25/2016 1025   ALT 11 07/25/2016 1025   BILITOT 0.64 07/25/2016 1025       Lab Results  Component Value Date   WBC 8.6 08/07/2016   HGB 11.0 (L) 08/07/2016   HCT 35.9 (L) 08/07/2016   MCV 90.4 08/07/2016   PLT 276 08/07/2016   NEUTROABS 6.6 08/06/2016     ASSESSMENT & PLAN:  Factor VIII inhibitor disorder (HCC) Factor VIII inhibitor: Massive hematoma/ecchymosis left lower extremity spontaneous Previously caused profound hemorrhage into the skin and subcutaneous tissues in 2016. He was then treated with prednisone and rituximab. This led to complete resolution of his symptoms. 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  Profound leg swelling with pain and redness: It had improved with antibiotics, diuretics. Ultrasound  of the leg was negative for blood clots. Leg pain: I prescribed her Percocet. I discontinue tramadol and Motrin. I also instructed not to take aspirin. Rituxan will be given today and I will see him back next week to assess how he is doing.  No orders of the defined types were placed in this encounter.  The patient has a good understanding of the overall plan. he agrees with it. he will call with any problems that may develop before the next visit here.   Rulon Eisenmenger, MD 08/15/16

## 2016-08-15 NOTE — Patient Instructions (Signed)
[facility] Discharge Instructions for Patients Receiving Chemotherapy  Today you received the following chemotherapy agents Rituxan To help prevent nausea and vomiting after your treatment, we encourage you to take your nausea medication   If you develop nausea and vomiting that is not controlled by your nausea medication, call the clinic.   BELOW ARE SYMPTOMS THAT SHOULD BE REPORTED IMMEDIATELY:  *FEVER GREATER THAN 100.5 F  *CHILLS WITH OR WITHOUT FEVER  NAUSEA AND VOMITING THAT IS NOT CONTROLLED WITH YOUR NAUSEA MEDICATION  *UNUSUAL SHORTNESS OF BREATH  *UNUSUAL BRUISING OR BLEEDING  TENDERNESS IN MOUTH AND THROAT WITH OR WITHOUT PRESENCE OF ULCERS  *URINARY PROBLEMS  *BOWEL PROBLEMS  UNUSUAL RASH Items with * indicate a potential emergency and should be followed up as soon as possible.  Feel free to call the clinic you have any questions or concerns. The clinic phone number is [phone].  Please show the [facility] at check-in to the Emergency Department and triage nurse.  Rituximab injection What is this medicine? RITUXIMAB (ri TUX i mab) is a monoclonal antibody. It is used commonly to treat non-Hodgkin lymphoma and other conditions. It is also used to treat rheumatoid arthritis (RA). In RA, this medicine slows the inflammatory process and help reduce joint pain and swelling. This medicine is often used with other cancer or arthritis medications. This medicine may be used for other purposes; ask your health care provider or pharmacist if you have questions. What should I tell my health care provider before I take this medicine? They need to know if you have any of these conditions: -blood disorders -heart disease -history of hepatitis B -infection (especially a virus infection such as chickenpox, cold sores, or herpes) -irregular heartbeat -kidney disease -lung or breathing disease, like asthma -lupus -an unusual or allergic reaction to  rituximab, mouse proteins, other medicines, foods, dyes, or preservatives -pregnant or trying to get pregnant -breast-feeding How should I use this medicine? This medicine is for infusion into a vein. It is administered in a hospital or clinic by a specially trained health care professional. A special MedGuide will be given to you by the pharmacist with each prescription and refill. Be sure to read this information carefully each time. Talk to your pediatrician regarding the use of this medicine in children. This medicine is not approved for use in children. Overdosage: If you think you have taken too much of this medicine contact a poison control center or emergency room at once. NOTE: This medicine is only for you. Do not share this medicine with others. What if I miss a dose? It is important not to miss a dose. Call your doctor or health care professional if you are unable to keep an appointment. What may interact with this medicine? -cisplatin -medicines for blood pressure -some other medicines for arthritis -vaccines This list may not describe all possible interactions. Give your health care provider a list of all the medicines, herbs, non-prescription drugs, or dietary supplements you use. Also tell them if you smoke, drink alcohol, or use illegal drugs. Some items may interact with your medicine. What should I watch for while using this medicine? Report any side effects that you notice during your treatment right away, such as changes in your breathing, fever, chills, dizziness or lightheadedness. These effects are more common with the first dose. Visit your prescriber or health care professional for checks on your progress. You will need to have regular blood work. Report any other side effects.  The side effects of this medicine can continue after you finish your treatment. Continue your course of treatment even though you feel ill unless your doctor tells you to stop. Call your doctor or  health care professional for advice if you get a fever, chills or sore throat, or other symptoms of a cold or flu. Do not treat yourself. This drug decreases your body's ability to fight infections. Try to avoid being around people who are sick. This medicine may increase your risk to bruise or bleed. Call your doctor or health care professional if you notice any unusual bleeding. Be careful brushing and flossing your teeth or using a toothpick because you may get an infection or bleed more easily. If you have any dental work done, tell your dentist you are receiving this medicine. Avoid taking products that contain aspirin, acetaminophen, ibuprofen, naproxen, or ketoprofen unless instructed by your doctor. These medicines may hide a fever. Do not become pregnant while taking this medicine. Women should inform their doctor if they wish to become pregnant or think they might be pregnant. There is a potential for serious side effects to an unborn child. Talk to your health care professional or pharmacist for more information. Do not breast-feed an infant while taking this medicine. What side effects may I notice from receiving this medicine? Side effects that you should report to your doctor or health care professional as soon as possible: -allergic reactions like skin rash, itching or hives, swelling of the face, lips, or tongue -low blood counts - this medicine may decrease the number of white blood cells, red blood cells and platelets. You may be at increased risk for infections and bleeding. -signs of infection - fever or chills, cough, sore throat, pain or difficulty passing urine -signs of decreased platelets or bleeding - bruising, pinpoint red spots on the skin, black, tarry stools, blood in the urine -signs of decreased red blood cells - unusually weak or tired, fainting spells, lightheadedness -breathing problems -confused, not responsive -chest pain -fast, irregular heartbeat -feeling faint  or lightheaded, falls -mouth sores -redness, blistering, peeling or loosening of the skin, including inside the mouth -stomach pain -swelling of the ankles, feet, or hands -trouble passing urine or change in the amount of urine Side effects that usually do not require medical attention (report to your doctor or other health care professional if they continue or are bothersome): -anxiety -headache -loss of appetite -muscle aches -nausea -night sweats This list may not describe all possible side effects. Call your doctor for medical advice about side effects. You may report side effects to FDA at 1-800-FDA-1088. Where should I keep my medicine? This drug is given in a hospital or clinic and will not be stored at home. NOTE: This sheet is a summary. It may not cover all possible information. If you have questions about this medicine, talk to your doctor, pharmacist, or health care provider.    2016, [employer]. (2015-02-14 22:30:56)

## 2016-08-15 NOTE — Progress Notes (Signed)
Left foot pain- 10/10 red swollen -requesting pain med. Order received.

## 2016-08-21 ENCOUNTER — Other Ambulatory Visit: Payer: Self-pay

## 2016-08-21 DIAGNOSIS — D68318 Other hemorrhagic disorder due to intrinsic circulating anticoagulants, antibodies, or inhibitors: Secondary | ICD-10-CM

## 2016-08-22 ENCOUNTER — Encounter: Payer: Self-pay | Admitting: Hematology and Oncology

## 2016-08-22 ENCOUNTER — Other Ambulatory Visit (HOSPITAL_BASED_OUTPATIENT_CLINIC_OR_DEPARTMENT_OTHER): Payer: PPO

## 2016-08-22 ENCOUNTER — Ambulatory Visit (HOSPITAL_BASED_OUTPATIENT_CLINIC_OR_DEPARTMENT_OTHER): Payer: PPO | Admitting: Hematology and Oncology

## 2016-08-22 ENCOUNTER — Ambulatory Visit (HOSPITAL_BASED_OUTPATIENT_CLINIC_OR_DEPARTMENT_OTHER): Payer: PPO

## 2016-08-22 VITALS — BP 146/77 | HR 69 | Temp 98.2°F | Resp 17

## 2016-08-22 DIAGNOSIS — R2240 Localized swelling, mass and lump, unspecified lower limb: Secondary | ICD-10-CM | POA: Diagnosis not present

## 2016-08-22 DIAGNOSIS — D68318 Other hemorrhagic disorder due to intrinsic circulating anticoagulants, antibodies, or inhibitors: Secondary | ICD-10-CM

## 2016-08-22 DIAGNOSIS — Z5112 Encounter for antineoplastic immunotherapy: Secondary | ICD-10-CM | POA: Diagnosis not present

## 2016-08-22 LAB — COMPREHENSIVE METABOLIC PANEL
ALBUMIN: 3.2 g/dL — AB (ref 3.5–5.0)
ALK PHOS: 67 U/L (ref 40–150)
ALT: 11 U/L (ref 0–55)
ANION GAP: 13 meq/L — AB (ref 3–11)
AST: 13 U/L (ref 5–34)
BUN: 18.1 mg/dL (ref 7.0–26.0)
CO2: 22 mEq/L (ref 22–29)
Calcium: 9.5 mg/dL (ref 8.4–10.4)
Chloride: 104 mEq/L (ref 98–109)
Creatinine: 1.3 mg/dL (ref 0.7–1.3)
EGFR: 66 mL/min/{1.73_m2} — AB (ref >90)
GLUCOSE: 98 mg/dL (ref 70–140)
POTASSIUM: 4.2 meq/L (ref 3.5–5.1)
SODIUM: 139 meq/L (ref 136–145)
Total Bilirubin: 0.37 mg/dL (ref 0.20–1.20)
Total Protein: 6.8 g/dL (ref 6.4–8.3)

## 2016-08-22 LAB — CBC WITH DIFFERENTIAL/PLATELET
BASO%: 0.2 % (ref 0.0–2.0)
BASOS ABS: 0 10*3/uL (ref 0.0–0.1)
EOS ABS: 0.2 10*3/uL (ref 0.0–0.5)
EOS%: 1.7 % (ref 0.0–7.0)
HCT: 39.4 % (ref 38.4–49.9)
HEMOGLOBIN: 12.2 g/dL — AB (ref 13.0–17.1)
LYMPH%: 7.1 % — AB (ref 14.0–49.0)
MCH: 27 pg — AB (ref 27.2–33.4)
MCHC: 31 g/dL — ABNORMAL LOW (ref 32.0–36.0)
MCV: 87.2 fL (ref 79.3–98.0)
MONO#: 1 10*3/uL — ABNORMAL HIGH (ref 0.1–0.9)
MONO%: 8.7 % (ref 0.0–14.0)
NEUT#: 8.9 10*3/uL — ABNORMAL HIGH (ref 1.5–6.5)
NEUT%: 82.3 % — ABNORMAL HIGH (ref 39.0–75.0)
Platelets: 312 10*3/uL (ref 140–400)
RBC: 4.52 10*6/uL (ref 4.20–5.82)
RDW: 15.8 % — AB (ref 11.0–14.6)
WBC: 10.9 10*3/uL — ABNORMAL HIGH (ref 4.0–10.3)
lymph#: 0.8 10*3/uL — ABNORMAL LOW (ref 0.9–3.3)

## 2016-08-22 MED ORDER — ACETAMINOPHEN 325 MG PO TABS
650.0000 mg | ORAL_TABLET | Freq: Once | ORAL | Status: AC
  Administered 2016-08-22: 650 mg via ORAL

## 2016-08-22 MED ORDER — DIPHENHYDRAMINE HCL 25 MG PO CAPS
ORAL_CAPSULE | ORAL | Status: AC
  Filled 2016-08-22: qty 2

## 2016-08-22 MED ORDER — ACETAMINOPHEN 325 MG PO TABS
ORAL_TABLET | ORAL | Status: AC
  Filled 2016-08-22: qty 2

## 2016-08-22 MED ORDER — SODIUM CHLORIDE 0.9 % IV SOLN
Freq: Once | INTRAVENOUS | Status: AC
  Administered 2016-08-22: 12:00:00 via INTRAVENOUS

## 2016-08-22 MED ORDER — DIPHENHYDRAMINE HCL 25 MG PO CAPS
50.0000 mg | ORAL_CAPSULE | Freq: Once | ORAL | Status: AC
  Administered 2016-08-22: 50 mg via ORAL

## 2016-08-22 MED ORDER — RITUXIMAB CHEMO INJECTION 500 MG/50ML
375.0000 mg/m2 | Freq: Once | INTRAVENOUS | Status: AC
  Administered 2016-08-22: 900 mg via INTRAVENOUS
  Filled 2016-08-22: qty 50

## 2016-08-22 NOTE — Assessment & Plan Note (Signed)
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 He was then treated with prednisone and rituximab. This led to complete resolution of his symptoms. 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  Profound leg swelling with pain and redness: It had improved with antibiotics, diuretics. Ultrasound of the leg was negative for blood clots. Leg pain: I prescribed her Percocet. I discontinue tramadol and Motrin. I also instructed not to take aspirin. Rituxan will be given today and I will see him back next week to assess how he is doing. I ordered factor VIII inhibitor levels.

## 2016-08-22 NOTE — Patient Instructions (Signed)
[facility] Discharge Instructions for Patients Receiving Chemotherapy  Today you received the following chemotherapy agents: Rituxan   To help prevent nausea and vomiting after your treatment, we encourage you to take your nausea medication as directed.    If you develop nausea and vomiting that is not controlled by your nausea medication, call the clinic.   BELOW ARE SYMPTOMS THAT SHOULD BE REPORTED IMMEDIATELY:  *FEVER GREATER THAN 100.5 F  *CHILLS WITH OR WITHOUT FEVER  NAUSEA AND VOMITING THAT IS NOT CONTROLLED WITH YOUR NAUSEA MEDICATION  *UNUSUAL SHORTNESS OF BREATH  *UNUSUAL BRUISING OR BLEEDING  TENDERNESS IN MOUTH AND THROAT WITH OR WITHOUT PRESENCE OF ULCERS  *URINARY PROBLEMS  *BOWEL PROBLEMS  UNUSUAL RASH Items with * indicate a potential emergency and should be followed up as soon as possible.  Feel free to call the clinic you have any questions or concerns. The clinic phone number is [phone].  Please show the CHEMO ALERT CARD at check-in to the Emergency Department and triage nurse.   

## 2016-08-22 NOTE — Progress Notes (Signed)
Patient Care Team: Biagio Borg, MD as PCP - General  DIAGNOSIS: Factor VIII inhibitor Current treatment: Rituxan cycle 3  CHIEF COMPLIANT: Marked improvement in bruising of the legs  INTERVAL HISTORY: Richard Hopkins is a 70 year old with above-mentioned history of factor VIII inhibitor who is here today to receive week 3 of rituximab. He is doing significantly better. The leg swelling pain and redness of both his legs has improved at least by 50-60%. He still has some tenderness and some redness but it appears to be improving extremely well with the Rituxan. He has tolerated Rituxan fairly well. Patient is not using much of pain medication.  REVIEW OF SYSTEMS:   Constitutional: Denies fevers, chills or abnormal weight loss Eyes: Denies blurriness of vision Ears, nose, mouth, throat, and face: Denies mucositis or sore throat Respiratory: Denies cough, dyspnea or wheezes Cardiovascular: Denies palpitation, chest discomfort Gastrointestinal:  Denies nausea, heartburn or change in bowel habits Skin: Denies abnormal skin rashes Lymphatics: Denies new lymphadenopathy or easy bruising Neurological:Denies numbness, tingling or new weaknesses Behavioral/Psych: Mood is stable, no new changes  Extremities: Bilateral lower extremity redness and swelling have improved, left arm edema from IV that went bad last week.  All other systems were reviewed with the patient and are negative.  I have reviewed the past medical history, past surgical history, social history and family history with the patient and they are unchanged from previous note.  ALLERGIES:  is allergic to doxycycline; ace inhibitors; cardura [doxazosin mesylate]; cozaar [losartan potassium]; dexilant [dexlansoprazole]; hydrocodone; lisinopril; norvasc [amlodipine besylate]; and alfuzosin.  MEDICATIONS:  Current Outpatient Prescriptions  Medication Sig Dispense Refill  . albuterol (PROAIR HFA) 108 (90 BASE) MCG/ACT inhaler 2  puffs every 4 hours as needed only  if your can't catch your breath (Patient taking differently: Inhale 1-2 puffs into the lungs every 4 (four) hours as needed for wheezing or shortness of breath. ) 1 Inhaler 11  . budesonide (PULMICORT) 0.25 MG/2ML nebulizer solution Take 2 mLs (0.25 mg total) by nebulization 2 (two) times daily. DX: J44.9 (Patient not taking: Reported on 08/06/2016) 120 mL 11  . clindamycin (CLEOCIN) 300 MG capsule Take 1 capsule (300 mg total) by mouth 3 (three) times daily. 36 capsule 0  . cloNIDine (CATAPRES) 0.1 MG tablet Take 1 tablet (0.1 mg total) by mouth 3 (three) times daily. 270 tablet 1  . formoterol (PERFOROMIST) 20 MCG/2ML nebulizer solution Take 2 mLs (20 mcg total) by nebulization 2 (two) times daily. DX: J44.9 (Patient not taking: Reported on 08/06/2016) 120 mL 11  . furosemide (LASIX) 20 MG tablet Take 2 tablets (40 mg total) by mouth daily. 60 tablet 2  . gabapentin (NEURONTIN) 300 MG capsule Take 1 capsule (300 mg total) by mouth at bedtime. 30 capsule 2  . ipratropium-albuterol (DUONEB) 0.5-2.5 (3) MG/3ML SOLN Take 3 mLs by nebulization every 6 (six) hours as needed (wheezing/shortness of breath). 60 mL 11  . lovastatin (MEVACOR) 20 MG tablet TAKE TWO TABLETS BY MOUTH ONCE DAILY IN THE EVENING (Patient not taking: Reported on 08/06/2016) 180 tablet 1  . metFORMIN (GLUCOPHAGE) 500 MG tablet TAKE ONE TABLET BY MOUTH TWICE DAILY WITH  A  MEAL (Patient taking differently: Take 500 mg by mouth 2 (two) times daily with a meal. ) 180 tablet 3  . omeprazole (PRILOSEC) 40 MG capsule Take 1 capsule by mouth every afternoon. (Patient taking differently: Take 40 mg by mouth daily. ) 30 capsule 6  . oxyCODONE-acetaminophen (PERCOCET/ROXICET) 5-325 MG tablet  Take 1 tablet by mouth every 6 (six) hours as needed for severe pain. 60 tablet 0  . predniSONE (DELTASONE) 20 MG tablet Take 1 tablet (20 mg total) by mouth daily with breakfast. 60 tablet 1  . saccharomyces boulardii  (FLORASTOR) 250 MG capsule Take 1 capsule (250 mg total) by mouth 2 (two) times daily. 24 capsule 0  . tamsulosin (FLOMAX) 0.4 MG CAPS capsule Take 1 capsule (0.4 mg total) by mouth daily. (Patient not taking: Reported on 08/06/2016) 30 capsule 3  . varenicline (CHANTIX CONTINUING MONTH PAK) 1 MG tablet Take 1 tablet (1 mg total) by mouth 2 (two) times daily. (Patient not taking: Reported on 08/06/2016) 60 tablet 1   No current facility-administered medications for this visit.    Facility-Administered Medications Ordered in Other Visits  Medication Dose Route Frequency Provider Last Rate Last Dose  . riTUXimab (RITUXAN) 900 mg in sodium chloride 0.9 % 160 mL chemo infusion  375 mg/m2 (Treatment Plan Recorded) Intravenous Once Serena Croissant, MD        PHYSICAL EXAMINATION: ECOG PERFORMANCE STATUS: 1 - Symptomatic but completely ambulatory  Vitals:   08/22/16 1029  BP: (!) 153/81  Pulse: 77  Resp: 18  Temp: 98 F (36.7 C)   Filed Weights   08/22/16 1029  Weight: 220 lb 11.2 oz (100.1 kg)    GENERAL:alert, no distress and comfortable SKIN: skin color, texture, turgor are normal, no rashes or significant lesions EYES: normal, Conjunctiva are pink and non-injected, sclera clear OROPHARYNX:no exudate, no erythema and lips, buccal mucosa, and tongue normal  NECK: supple, thyroid normal size, non-tender, without nodularity LYMPH:  no palpable lymphadenopathy in the cervical, axillary or inguinal LUNGS: clear to auscultation and percussion with normal breathing effort HEART: regular rate & rhythm and no murmurs and no lower extremity edema ABDOMEN:abdomen soft, non-tender and normal bowel sounds MUSCULOSKELETAL:no cyanosis of digits and no clubbing  NEURO: alert & oriented x 3 with fluent speech, no focal motor/sensory deficits EXTREMITIES: Redness of both lower extremities and swelling much improved, redness of the left arm from a bruise related to IV line.   LABORATORY DATA:  I have  reviewed the data as listed   Chemistry      Component Value Date/Time   NA 139 08/22/2016 0929   K 4.2 08/22/2016 0929   CL 102 08/07/2016 0403   CO2 22 08/22/2016 0929   BUN 18.1 08/22/2016 0929   CREATININE 1.3 08/22/2016 0929      Component Value Date/Time   CALCIUM 9.5 08/22/2016 0929   ALKPHOS 67 08/22/2016 0929   AST 13 08/22/2016 0929   ALT 11 08/22/2016 0929   BILITOT 0.37 08/22/2016 0929       Lab Results  Component Value Date   WBC 10.9 (H) 08/22/2016   HGB 12.2 (L) 08/22/2016   HCT 39.4 08/22/2016   MCV 87.2 08/22/2016   PLT 312 08/22/2016   NEUTROABS 8.9 (H) 08/22/2016     ASSESSMENT & PLAN:  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 He was then treated with prednisone and rituximab. This led to complete resolution of his symptoms. 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  Profound leg swelling  with pain and redness: It had improved with antibiotics, diuretics. Ultrasound of the leg was negative for blood clots. Leg pain: I prescribed her Percocet. I discontinue tramadol and Motrin. I also instructed not to take aspirin. Rituxan will be given today and I will see him back next week to assess how he is doing. I ordered factor VIII inhibitor levels. Return in one week for last of his Rituxan treatments.  No orders of the defined types were placed in this encounter.  The patient has a good understanding of the overall plan. he agrees with it. he will call with any problems that may develop before the next visit here.    Rulon Eisenmenger, MD 08/22/16

## 2016-08-27 LAB — FACTOR 8 INHIBITOR
APTT 11 NP MIX, 60 MIN, INCUB.: 34.2 s — AB (ref 22.9–30.2)
APTT: 38.4 s — AB (ref 22.9–30.2)
FACTOR VIII ACTIVITY: 2 % — AB (ref 57–163)
Factor VIII Bethesda Titer: 1.8 Bethesda — ABNORMAL HIGH (ref 0.0–0.8)
aPTT 1:1 Mix Saline: 59.5 s
aPTT 1:1 Normal Plasma: 29.4 s (ref 22.9–30.2)

## 2016-08-29 ENCOUNTER — Other Ambulatory Visit: Payer: Self-pay | Admitting: Hematology and Oncology

## 2016-08-29 ENCOUNTER — Ambulatory Visit (HOSPITAL_BASED_OUTPATIENT_CLINIC_OR_DEPARTMENT_OTHER): Payer: PPO

## 2016-08-29 ENCOUNTER — Telehealth: Payer: Self-pay | Admitting: Hematology and Oncology

## 2016-08-29 ENCOUNTER — Other Ambulatory Visit: Payer: PPO

## 2016-08-29 ENCOUNTER — Encounter: Payer: Self-pay | Admitting: Hematology and Oncology

## 2016-08-29 ENCOUNTER — Ambulatory Visit (HOSPITAL_BASED_OUTPATIENT_CLINIC_OR_DEPARTMENT_OTHER): Payer: PPO | Admitting: Hematology and Oncology

## 2016-08-29 VITALS — BP 143/82 | HR 73 | Temp 98.2°F | Resp 18

## 2016-08-29 DIAGNOSIS — Z5112 Encounter for antineoplastic immunotherapy: Secondary | ICD-10-CM

## 2016-08-29 DIAGNOSIS — D68318 Other hemorrhagic disorder due to intrinsic circulating anticoagulants, antibodies, or inhibitors: Secondary | ICD-10-CM

## 2016-08-29 MED ORDER — AMOXICILLIN 500 MG PO TABS: 500.0000 mg | ORAL_TABLET | Freq: Two times a day (BID) | ORAL | 0 refills | Status: DC

## 2016-08-29 MED ORDER — ACETAMINOPHEN 325 MG PO TABS
ORAL_TABLET | ORAL | Status: AC
  Filled 2016-08-29: qty 2

## 2016-08-29 MED ORDER — DIPHENHYDRAMINE HCL 25 MG PO CAPS
50.0000 mg | ORAL_CAPSULE | Freq: Once | ORAL | Status: AC
  Administered 2016-08-29: 50 mg via ORAL

## 2016-08-29 MED ORDER — DIPHENHYDRAMINE HCL 25 MG PO CAPS
ORAL_CAPSULE | ORAL | Status: AC
  Filled 2016-08-29: qty 2

## 2016-08-29 MED ORDER — SODIUM CHLORIDE 0.9 % IV SOLN
Freq: Once | INTRAVENOUS | Status: AC
  Administered 2016-08-29: 10:00:00 via INTRAVENOUS

## 2016-08-29 MED ORDER — SODIUM CHLORIDE 0.9 % IV SOLN
375.0000 mg/m2 | Freq: Once | INTRAVENOUS | Status: AC
  Administered 2016-08-29: 900 mg via INTRAVENOUS
  Filled 2016-08-29: qty 50

## 2016-08-29 MED ORDER — ACETAMINOPHEN 325 MG PO TABS
650.0000 mg | ORAL_TABLET | Freq: Once | ORAL | Status: AC
Start: 2016-08-29 — End: 2016-08-29
  Administered 2016-08-29: 650 mg via ORAL

## 2016-08-29 NOTE — Telephone Encounter (Signed)
appt made and avs printed °

## 2016-08-29 NOTE — Progress Notes (Signed)
Patient Care Team: Biagio Borg, MD as PCP - General  DIAGNOSIS: Factor VIII inhibitor with severe bruising  CHIEF COMPLIANT: Leg swelling and burning pain  INTERVAL HISTORY: Richard Hopkins is a 70 year old with above-mentioned history of factor VIII inhibitor which caused profound ecchymosis and bleeding into his leg. We started him on rituximab treatment and he had received 3 treatments of 4. He has noticed remarkable improvement in the leg symptoms. Today is a fourth and final treatment of rituximab. Is tolerating the tamoxifen fairly well. The redness and purplish discoloration in his legs is improving.  REVIEW OF SYSTEMS:   Constitutional: Denies fevers, chills or abnormal weight loss Eyes: Denies blurriness of vision Ears, nose, mouth, throat, and face: Denies mucositis or sore throat Respiratory: Denies cough, dyspnea or wheezes Cardiovascular: Denies palpitation, chest discomfort Gastrointestinal:  Denies nausea, heartburn or change in bowel habits Skin: Denies abnormal skin rashes Lymphatics: Denies new lymphadenopathy or easy bruising Neurological:Denies numbness, tingling or new weaknesses Behavioral/Psych: Mood is stable, no new changes  Extremities: purplish discoloration or ecchymosis with mild leg swelling have markedly improved  All other systems were reviewed with the patient and are negative.  I have reviewed the past medical history, past surgical history, social history and family history with the patient and they are unchanged from previous note.  ALLERGIES:  is allergic to doxycycline; ace inhibitors; cardura [doxazosin mesylate]; cozaar [losartan potassium]; dexilant [dexlansoprazole]; hydrocodone; lisinopril; norvasc [amlodipine besylate]; and alfuzosin.  MEDICATIONS:  Current Outpatient Prescriptions  Medication Sig Dispense Refill  . albuterol (PROAIR HFA) 108 (90 BASE) MCG/ACT inhaler 2 puffs every 4 hours as needed only  if your can't catch your  breath (Patient taking differently: Inhale 1-2 puffs into the lungs every 4 (four) hours as needed for wheezing or shortness of breath. ) 1 Inhaler 11  . amoxicillin (AMOXIL) 500 MG tablet Take 1 tablet (500 mg total) by mouth 2 (two) times daily. 14 tablet 0  . budesonide (PULMICORT) 0.25 MG/2ML nebulizer solution Take 2 mLs (0.25 mg total) by nebulization 2 (two) times daily. DX: J44.9 (Patient not taking: Reported on 08/06/2016) 120 mL 11  . formoterol (PERFOROMIST) 20 MCG/2ML nebulizer solution Take 2 mLs (20 mcg total) by nebulization 2 (two) times daily. DX: J44.9 (Patient not taking: Reported on 08/06/2016) 120 mL 11  . gabapentin (NEURONTIN) 300 MG capsule Take 1 capsule (300 mg total) by mouth at bedtime. 30 capsule 2  . ipratropium-albuterol (DUONEB) 0.5-2.5 (3) MG/3ML SOLN Take 3 mLs by nebulization every 6 (six) hours as needed (wheezing/shortness of breath). 60 mL 11  . metFORMIN (GLUCOPHAGE) 500 MG tablet TAKE ONE TABLET BY MOUTH TWICE DAILY WITH  A  MEAL (Patient taking differently: Take 500 mg by mouth 2 (two) times daily with a meal. ) 180 tablet 3  . omeprazole (PRILOSEC) 40 MG capsule Take 1 capsule by mouth every afternoon. (Patient taking differently: Take 40 mg by mouth daily. ) 30 capsule 6  . predniSONE (DELTASONE) 20 MG tablet Take 1 tablet (20 mg total) by mouth daily with breakfast. 60 tablet 1  . tamsulosin (FLOMAX) 0.4 MG CAPS capsule Take 1 capsule (0.4 mg total) by mouth daily. (Patient not taking: Reported on 08/06/2016) 30 capsule 3   No current facility-administered medications for this visit.     PHYSICAL EXAMINATION: ECOG PERFORMANCE STATUS: 1 - Symptomatic but completely ambulatory  Vitals:   08/29/16 0900  BP: (!) 156/92  Pulse: 75  Resp: 18  Temp: 98.4 F (36.9  C)   Filed Weights   08/29/16 0900  Weight: 217 lb (98.4 kg)    GENERAL:alert, no distress and comfortable SKIN: skin color, texture, turgor are normal, no rashes or significant lesions EYES:  normal, Conjunctiva are pink and non-injected, sclera clear OROPHARYNX:no exudate, no erythema and lips, buccal mucosa, and tongue normal  NECK: supple, thyroid normal size, non-tender, without nodularity LYMPH:  no palpable lymphadenopathy in the cervical, axillary or inguinal LUNGS: clear to auscultation and percussion with normal breathing effort HEART: regular rate & rhythm and no murmurs and no lower extremity edema ABDOMEN:abdomen soft, non-tender and normal bowel sounds MUSCULOSKELETAL:no cyanosis of digits and no clubbing  NEURO: alert & oriented x 3 with fluent speech, no focal motor/sensory deficits EXTREMITIES: lower extremity redness, mild swelling and ecchymosis improved significantly. Even left upper arm extremity redness and bruising has improved significantly.  LABORATORY DATA:  I have reviewed the data as listed   Chemistry      Component Value Date/Time   NA 139 08/22/2016 0929   K 4.2 08/22/2016 0929   CL 102 08/07/2016 0403   CO2 22 08/22/2016 0929   BUN 18.1 08/22/2016 0929   CREATININE 1.3 08/22/2016 0929      Component Value Date/Time   CALCIUM 9.5 08/22/2016 0929   ALKPHOS 67 08/22/2016 0929   AST 13 08/22/2016 0929   ALT 11 08/22/2016 0929   BILITOT 0.37 08/22/2016 0929       Lab Results  Component Value Date   WBC 10.9 (H) 08/22/2016   HGB 12.2 (L) 08/22/2016   HCT 39.4 08/22/2016   MCV 87.2 08/22/2016   PLT 312 08/22/2016   NEUTROABS 8.9 (H) 08/22/2016     ASSESSMENT & PLAN:  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 He was then treated with prednisone and rituximab. This led to complete resolution of his symptoms. Diagnostic workup proved factor VIII inhibitor is still present currently  Current treatment: IV rituximab weekly 4 started 08/01/2016 (along with tapering schedule  of prednisone); treatment was held on 08/08/2016 due to hospitalization (08/06/2016 - 08/08/2016) No platelet function abnormalities were detected.  Profound leg swelling with pain and redness: It had improved with antibiotics, diuretics and rituximab. Ultrasound of the leg was negative for blood clots. Factor VIII inhibitor levels have decreased from 11.2 to 1.8 BU Factor VIII activity is still at 2%  Leg pain: prescribed Percocet.We discontinued tramadol and Motrin.patient is not using much pain medication anymore because of marked improvement in symptoms.  Rituxan will be given today and I will see him back in 2 months to assess his symptoms. I will recheck factor VIII inhibitor titers again prior to the visit.  Orders Placed This Encounter  Procedures  . CBC with Differential    Standing Status:   Future    Standing Expiration Date:   08/29/2017  . Factor 8 assay    Standing Status:   Future    Standing Expiration Date:   08/29/2017  . Factor 8 Inhibitor    Standing Status:   Future    Standing Expiration Date:   08/29/2017   The patient has a good understanding of the overall plan. he agrees with it. he will call with any problems that may develop before the next visit here.   Rulon Eisenmenger, MD 08/29/16

## 2016-08-29 NOTE — Patient Instructions (Signed)
[facility] Discharge Instructions for Patients Receiving Chemotherapy  Today you received the following chemotherapy agents: Rituxan   To help prevent nausea and vomiting after your treatment, we encourage you to take your nausea medication as directed.    If you develop nausea and vomiting that is not controlled by your nausea medication, call the clinic.   BELOW ARE SYMPTOMS THAT SHOULD BE REPORTED IMMEDIATELY:  *FEVER GREATER THAN 100.5 F  *CHILLS WITH OR WITHOUT FEVER  NAUSEA AND VOMITING THAT IS NOT CONTROLLED WITH YOUR NAUSEA MEDICATION  *UNUSUAL SHORTNESS OF BREATH  *UNUSUAL BRUISING OR BLEEDING  TENDERNESS IN MOUTH AND THROAT WITH OR WITHOUT PRESENCE OF ULCERS  *URINARY PROBLEMS  *BOWEL PROBLEMS  UNUSUAL RASH Items with * indicate a potential emergency and should be followed up as soon as possible.  Feel free to call the clinic you have any questions or concerns. The clinic phone number is [phone].  Please show the CHEMO ALERT CARD at check-in to the Emergency Department and triage nurse.   

## 2016-08-29 NOTE — Assessment & Plan Note (Signed)
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 He was then treated with prednisone and rituximab. This led to complete resolution of his symptoms. 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  Profound leg swelling with pain and redness: It had improved with antibiotics, diuretics and rituximab. Ultrasound of the leg was negative for blood clots. Factor VIII inhibitor levels have decreased from 11.2 to 1.8 BU Factor VIII activity is still at 2%  Leg pain: prescribed Percocet.We discontinued tramadol and Motrin.patient is not using much pain medication anymore because of marked improvement in symptoms.  Rituxan will be given today and I will see him back in one month to assess his symptoms. I will recheck factor VIII inhibitor titers again in one month.

## 2016-09-04 ENCOUNTER — Telehealth: Payer: Self-pay | Admitting: *Deleted

## 2016-09-04 DIAGNOSIS — D68318 Other hemorrhagic disorder due to intrinsic circulating anticoagulants, antibodies, or inhibitors: Secondary | ICD-10-CM

## 2016-09-04 NOTE — Telephone Encounter (Signed)
"  Tell Dr. Lindi Adie the antibiotic he gave me for cold symptoms is not working.  I will take the last Amoxicilin today.  I have a cough but what's down is stuck and I can't get it up.  I do not have a means to check my temperature.  I feel weak.  Not any better than before with laryngitis.  Return number [phone].

## 2016-09-05 MED ORDER — AZITHROMYCIN 250 MG PO TABS: ORAL_TABLET | ORAL | 0 refills | Status: DC

## 2016-09-05 NOTE — Telephone Encounter (Signed)
Let pt know z-pak sent to pharmacy.  Recommended pt get guafinesin.  Pt voiced understanding.

## 2016-09-17 ENCOUNTER — Ambulatory Visit (INDEPENDENT_AMBULATORY_CARE_PROVIDER_SITE_OTHER): Payer: PPO | Admitting: Internal Medicine

## 2016-09-17 ENCOUNTER — Encounter: Payer: Self-pay | Admitting: Internal Medicine

## 2016-09-17 VITALS — BP 138/80 | HR 90 | Temp 98.7°F | Resp 20 | Wt 213.0 lb

## 2016-09-17 DIAGNOSIS — R05 Cough: Secondary | ICD-10-CM

## 2016-09-17 DIAGNOSIS — J441 Chronic obstructive pulmonary disease with (acute) exacerbation: Secondary | ICD-10-CM | POA: Diagnosis not present

## 2016-09-17 DIAGNOSIS — J309 Allergic rhinitis, unspecified: Secondary | ICD-10-CM

## 2016-09-17 DIAGNOSIS — J449 Chronic obstructive pulmonary disease, unspecified: Secondary | ICD-10-CM

## 2016-09-17 DIAGNOSIS — R059 Cough, unspecified: Secondary | ICD-10-CM

## 2016-09-17 DIAGNOSIS — I10 Essential (primary) hypertension: Secondary | ICD-10-CM

## 2016-09-17 MED ORDER — HYDROCODONE-HOMATROPINE 5-1.5 MG/5ML PO SYRP: 5.0000 mL | ORAL_SOLUTION | Freq: Four times a day (QID) | ORAL | 0 refills | Status: AC | PRN

## 2016-09-17 MED ORDER — METHYLPREDNISOLONE ACETATE 80 MG/ML IJ SUSP
80.0000 mg | Freq: Once | INTRAMUSCULAR | Status: AC
  Administered 2016-09-17: 80 mg via INTRAMUSCULAR

## 2016-09-17 MED ORDER — PREDNISONE 10 MG PO TABS: ORAL_TABLET | ORAL | 0 refills | Status: DC

## 2016-09-17 MED ORDER — BUDESONIDE 0.25 MG/2ML IN SUSP: 0.2500 mg | Freq: Two times a day (BID) | RESPIRATORY_TRACT | 11 refills | Status: DC

## 2016-09-17 MED ORDER — LEVOFLOXACIN 250 MG PO TABS: 250.0000 mg | ORAL_TABLET | Freq: Every day | ORAL | 0 refills | Status: AC

## 2016-09-17 MED ORDER — FORMOTEROL FUMARATE 20 MCG/2ML IN NEBU: 20.0000 ug | INHALATION_SOLUTION | Freq: Two times a day (BID) | RESPIRATORY_TRACT | 11 refills | Status: AC

## 2016-09-17 NOTE — Assessment & Plan Note (Signed)
Mild to mod, c/w bronchitis vs pna, declines cxr, for antibx course, to f/u any worsening symptoms or concerns  

## 2016-09-17 NOTE — Progress Notes (Signed)
Pre visit review using our clinic review tool, if applicable. No additional management support is needed unless otherwise documented below in the visit note. 

## 2016-09-17 NOTE — Assessment & Plan Note (Signed)
stable overall by history and exam, recent data reviewed with pt, and pt to continue medical treatment as before,  to f/u any worsening symptoms or concerns le BP Readings from Last 3 Encounters:  09/17/16 138/80  08/29/16 (!) 143/82  08/29/16 (!) 156/92

## 2016-09-17 NOTE — Patient Instructions (Signed)
You had the steroid shot today  Please take all new medication as prescribed - the antibiotic, cough medicine, and prednisone  Please continue all other medications as before, and refills have been done if requested - to restart the perforomist, and the Pulmicort for the nebulizer treatments  Please have the pharmacy call with any other refills you may need.  Please keep your appointments with your specialists as you may have planned

## 2016-09-17 NOTE — Assessment & Plan Note (Signed)
Mild to mod, for antibx course, depomedrol IM, predpac asd, cough med prn, and re-start nebs as has run out of med,  to f/u any worsening symptoms or concerns

## 2016-09-17 NOTE — Progress Notes (Signed)
Subjective:    Patient ID: Richard Hopkins, male    DOB: 06/12/1946, 70 y.o.   MRN: [id number]  HPI  Here with acute onset mild to mod 2-3 days ST, HA, general weakness and malaise, with prod cough greenish sputum, but Pt denies chest pain, increased sob or doe, wheezing, orthopnea, PND, increased LE swelling, palpitations, dizziness or syncope, except for wheezing and sob worsening last PM  Recent zpack no help.  Pt denies new neurological symptoms such as new headache, or facial or extremity weakness or numbness   Pt denies polydipsia, polyuria,  Does have several wks ongoing nasal allergy symptoms with clearish congestion, itch and sneezing, without fever, pain.  Past Medical History:  Diagnosis Date  . ABNORMAL ELECTROCARDIOGRAM   . Acute prostatitis   . ADENOMATOUS COLONIC POLYP   . ALLERGIC RHINITIS   . BENIGN PROSTATIC HYPERTROPHY   . BLURRED VISION   . Brain tumor ([place])   . Cardiomyopathy ([place]) 01/12/2016  . CHRONIC OBSTRUCTIVE PULMONARY DISEASE, ACUTE EXACERBATION   . Chronic steroid use   . COPD   . COPD (chronic obstructive pulmonary disease) ([place])   . DIABETES MELLITUS, TYPE II   . DIVERTICULAR DISEASE   . DYSPNEA   . DYSPNEA/SHORTNESS OF BREATH   . EPIDIDYMO-ORCHITIS   . ERECTILE DYSFUNCTION, ORGANIC, HX OF   . ESOPHAGEAL STRICTURE   . FATIGUE   . GERD   . HYPERLIPIDEMIA   . HYPERTENSION   . LUMBAR DISC DISORDER   . MASS, SUPERFICIAL   . MUSCLE CRAMPS   . Nocturia   . Pain in soft tissues of limb   . PSA, INCREASED   . PULMONARY FIBROSIS   . SKIN LESION   . SMOKER   . SYNCOPE   . THRUSH   . Wheezing    Past Surgical History:  Procedure Laterality Date  . BACK SURGERY     x 4 disabled after 2003  . CARDIAC CATHETERIZATION N/A 03/26/2016   Procedure: Left Heart Cath and Coronary Angiography;  Surgeon: Josue Hector, MD;  Location: [facility];  Service: Cardiovascular;  Laterality: N/A;  . ELBOW ARTHROSCOPY      reports that he has been smoking  Cigarettes.  He has a 50.00 pack-year smoking history. He has never used smokeless tobacco. He reports that he does not drink alcohol or use drugs. family history includes Asthma in his mother; Diabetes in his mother; Emphysema in his mother. Allergies  Allergen Reactions  . Doxycycline Nausea Only and Other (See Comments)    Loss of memory; "bad feeling," dizziness  . Ace Inhibitors Cough  . Cardura [Doxazosin Mesylate] Other (See Comments)    dizziness  . Cozaar [Losartan Potassium] Other (See Comments)    dizziness  . Dexilant [Dexlansoprazole] Itching and Rash  . Hydrocodone Nausea Only and Other (See Comments)    dizziness  . Lisinopril Cough  . Norvasc [Amlodipine Besylate] Other (See Comments)    3 times dizzy, bad taste in mouth, none since stopped  . Alfuzosin Rash   Current Outpatient Prescriptions on File Prior to Visit  Medication Sig Dispense Refill  . albuterol (PROAIR HFA) 108 (90 BASE) MCG/ACT inhaler 2 puffs every 4 hours as needed only  if your can't catch your breath (Patient taking differently: Inhale 1-2 puffs into the lungs every 4 (four) hours as needed for wheezing or shortness of breath. ) 1 Inhaler 11  . gabapentin (NEURONTIN) 300 MG capsule Take 1 capsule (300 mg  total) by mouth at bedtime. 30 capsule 2  . ipratropium-albuterol (DUONEB) 0.5-2.5 (3) MG/3ML SOLN Take 3 mLs by nebulization every 6 (six) hours as needed (wheezing/shortness of breath). 60 mL 11  . metFORMIN (GLUCOPHAGE) 500 MG tablet TAKE ONE TABLET BY MOUTH TWICE DAILY WITH  A  MEAL (Patient taking differently: Take 500 mg by mouth 2 (two) times daily with a meal. ) 180 tablet 3  . omeprazole (PRILOSEC) 40 MG capsule Take 1 capsule by mouth every afternoon. (Patient taking differently: Take 40 mg by mouth daily. ) 30 capsule 6  . predniSONE (DELTASONE) 20 MG tablet Take 1 tablet (20 mg total) by mouth daily with breakfast. 60 tablet 1  . tamsulosin (FLOMAX) 0.4 MG CAPS capsule Take 1 capsule (0.4 mg  total) by mouth daily. 30 capsule 3   No current facility-administered medications on file prior to visit.    Review of Systems  Constitutional: Negative for unusual diaphoresis or night sweats HENT: Negative for ear swelling or discharge Eyes: Negative for worsening visual haziness  Respiratory: Negative for choking and stridor.   Gastrointestinal: Negative for distension or worsening eructation Genitourinary: Negative for retention or change in urine volume.  Musculoskeletal: Negative for other MSK pain or swelling Skin: Negative for color change and worsening wound Neurological: Negative for tremors and numbness other than noted  Psychiatric/Behavioral: Negative for decreased concentration or agitation other than above       Objective:   Physical Exam BP 138/80   Pulse 90   Temp 98.7 F (37.1 C) (Oral)   Resp 20   Wt 213 lb (96.6 kg)   SpO2 92%   BMI 26.62 kg/m  VS noted, mild ill Constitutional: Pt appears in no apparent distress HENT: Head: NCAT.  Right Ear: External ear normal.  Left Ear: External ear normal.  Eyes: . Pupils are equal, round, and reactive to light. Conjunctivae and EOM are normal Neck: Normal range of motion. Neck supple.  Cardiovascular: Normal rate and regular rhythm.   Pulmonary/Chest: Effort normal and breath sounds decreased without rales but with bilat mild wheezing.  Neurological: Pt is alert. Not confused , motor grossly intact Skin: Skin is warm. No rash, no LE edema Psychiatric: Pt behavior is normal. No agitation.     Assessment & Plan:

## 2016-09-22 ENCOUNTER — Other Ambulatory Visit: Payer: Self-pay | Admitting: Internal Medicine

## 2016-10-29 ENCOUNTER — Telehealth: Payer: Self-pay | Admitting: Hematology and Oncology

## 2016-10-29 NOTE — Telephone Encounter (Signed)
11/9 Appointment canceled per patient request. The patient plans to call back to reschedule.

## 2016-10-30 ENCOUNTER — Other Ambulatory Visit: Payer: PPO

## 2016-10-30 ENCOUNTER — Ambulatory Visit (INDEPENDENT_AMBULATORY_CARE_PROVIDER_SITE_OTHER): Payer: PPO | Admitting: Internal Medicine

## 2016-10-30 ENCOUNTER — Encounter: Payer: Self-pay | Admitting: Internal Medicine

## 2016-10-30 VITALS — BP 140/80 | HR 82 | Temp 98.7°F | Resp 20 | Wt 218.0 lb

## 2016-10-30 DIAGNOSIS — J449 Chronic obstructive pulmonary disease, unspecified: Secondary | ICD-10-CM | POA: Diagnosis not present

## 2016-10-30 DIAGNOSIS — Z0001 Encounter for general adult medical examination with abnormal findings: Secondary | ICD-10-CM

## 2016-10-30 DIAGNOSIS — I10 Essential (primary) hypertension: Secondary | ICD-10-CM | POA: Diagnosis not present

## 2016-10-30 DIAGNOSIS — E119 Type 2 diabetes mellitus without complications: Secondary | ICD-10-CM

## 2016-10-30 DIAGNOSIS — R42 Dizziness and giddiness: Secondary | ICD-10-CM

## 2016-10-30 MED ORDER — MECLIZINE HCL 12.5 MG PO TABS: 12.5000 mg | ORAL_TABLET | Freq: Three times a day (TID) | ORAL | 1 refills | Status: DC | PRN

## 2016-10-30 MED ORDER — BUDESONIDE 0.25 MG/2ML IN SUSP: 0.2500 mg | Freq: Two times a day (BID) | RESPIRATORY_TRACT | 11 refills | Status: DC

## 2016-10-30 MED ORDER — BUDESONIDE-FORMOTEROL FUMARATE 160-4.5 MCG/ACT IN AERO: 2.0000 | INHALATION_SPRAY | Freq: Two times a day (BID) | RESPIRATORY_TRACT | 12 refills | Status: AC

## 2016-10-30 MED ORDER — CLONIDINE HCL 0.1 MG PO TABS: 0.1000 mg | ORAL_TABLET | Freq: Two times a day (BID) | ORAL | 1 refills | Status: DC

## 2016-10-30 NOTE — Patient Instructions (Addendum)
You had the flu shot today  Please take all new medication as prescribed - the meclizine as needed for dizziness  Please continue all other medications as before, and refills have been done if requested - the symbicort  Please have the pharmacy call with any other refills you may need.  Please continue your efforts at being more active, low cholesterol diet, and weight control.  You are otherwise up to date with prevention measures today.  Please keep your appointments with your specialists as you may have planned  Please return in 6 months, or sooner if needed, with Lab testing done 3-5 days before

## 2016-10-30 NOTE — Progress Notes (Signed)
Pre visit review using our clinic review tool, if applicable. No additional management support is needed unless otherwise documented below in the visit note. 

## 2016-10-30 NOTE — Progress Notes (Signed)
Subjective:    Patient ID: Richard Hopkins, male    DOB: [birth year], 70 y.o.   MRN: [id number]  HPI  Here to f/u; overall doing ok,  Pt denies chest pain, increasing sob or doe, wheezing, orthopnea, PND, increased LE swelling, palpitations, or syncope, except has had worsening positional dizziness in the past 2 wks with ear fullness.  Pt denies new neurological symptoms such as new headache, or facial or extremity weakness or numbness.  Pt denies polydipsia, polyuria, or low sugar episode.   Pt denies new neurological symptoms such as new headache, or facial or extremity weakness or numbness.   Pt states overall good compliance with meds, mostly trying to follow appropriate diet, with wt overall stable,  but little exercise however on a regular basis, does walk occasionally, and plays golf about once per wk.   Only takes the clonidine bid.  BP has been ok at home < 140/90 recently, Also with mention would like to try the symbicort rx again to see if insurance will pay.   Pt denies fever, wt loss, night sweats, loss of appetite, or other constitutional symptoms.  Has ongoing nasal congestion, allergy symptoms, declines referral to allergy  No other changes to hx Wt Readings from Last 3 Encounters:  10/30/16 218 lb (98.9 kg)  09/17/16 213 lb (96.6 kg)  08/29/16 217 lb (98.4 kg)   Past Medical History:  Diagnosis Date  . ABNORMAL ELECTROCARDIOGRAM   . Acute prostatitis   . ADENOMATOUS COLONIC POLYP   . ALLERGIC RHINITIS   . BENIGN PROSTATIC HYPERTROPHY   . BLURRED VISION   . Brain tumor ([place])   . Cardiomyopathy ([place]) 01/12/2016  . CHRONIC OBSTRUCTIVE PULMONARY DISEASE, ACUTE EXACERBATION   . Chronic steroid use   . COPD   . COPD (chronic obstructive pulmonary disease) ([place])   . DIABETES MELLITUS, TYPE II   . DIVERTICULAR DISEASE   . DYSPNEA   . DYSPNEA/SHORTNESS OF BREATH   . EPIDIDYMO-ORCHITIS   . ERECTILE DYSFUNCTION, ORGANIC, HX OF   . ESOPHAGEAL STRICTURE   . FATIGUE   . GERD   .  HYPERLIPIDEMIA   . HYPERTENSION   . LUMBAR DISC DISORDER   . MASS, SUPERFICIAL   . MUSCLE CRAMPS   . Nocturia   . Pain in soft tissues of limb   . PSA, INCREASED   . PULMONARY FIBROSIS   . SKIN LESION   . SMOKER   . SYNCOPE   . THRUSH   . Wheezing    Past Surgical History:  Procedure Laterality Date  . BACK SURGERY     x 4 disabled after 2003  . CARDIAC CATHETERIZATION N/A 03/26/2016   Procedure: Left Heart Cath and Coronary Angiography;  Surgeon: Josue Hector, MD;  Location: [facility];  Service: Cardiovascular;  Laterality: N/A;  . ELBOW ARTHROSCOPY      reports that he has been smoking Cigarettes.  He has a 50.00 pack-year smoking history. He has never used smokeless tobacco. He reports that he does not drink alcohol or use drugs. family history includes Asthma in his mother; Diabetes in his mother; Emphysema in his mother. Allergies  Allergen Reactions  . Doxycycline Nausea Only and Other (See Comments)    Loss of memory; "bad feeling," dizziness  . Ace Inhibitors Cough  . Cardura [Doxazosin Mesylate] Other (See Comments)    dizziness  . Cozaar [Losartan Potassium] Other (See Comments)    dizziness  . Dexilant [Dexlansoprazole] Itching and Rash  .  Hydrocodone Nausea Only and Other (See Comments)    dizziness  . Lisinopril Cough  . Norvasc [Amlodipine Besylate] Other (See Comments)    3 times dizzy, bad taste in mouth, none since stopped  . Alfuzosin Rash   Current Outpatient Prescriptions on File Prior to Visit  Medication Sig Dispense Refill  . albuterol (PROAIR HFA) 108 (90 BASE) MCG/ACT inhaler 2 puffs every 4 hours as needed only  if your can't catch your breath (Patient taking differently: Inhale 1-2 puffs into the lungs every 4 (four) hours as needed for wheezing or shortness of breath. ) 1 Inhaler 11  . formoterol (PERFOROMIST) 20 MCG/2ML nebulizer solution Take 2 mLs (20 mcg total) by nebulization 2 (two) times daily. DX: J44.9 120 mL 11  . gabapentin  (NEURONTIN) 300 MG capsule Take 1 capsule (300 mg total) by mouth at bedtime. 30 capsule 2  . ipratropium-albuterol (DUONEB) 0.5-2.5 (3) MG/3ML SOLN Take 3 mLs by nebulization every 6 (six) hours as needed (wheezing/shortness of breath). 60 mL 11  . metFORMIN (GLUCOPHAGE) 500 MG tablet TAKE ONE TABLET BY MOUTH TWICE DAILY WITH  A  MEAL (Patient taking differently: Take 500 mg by mouth 2 (two) times daily with a meal. ) 180 tablet 3  . omeprazole (PRILOSEC) 40 MG capsule Take 1 capsule by mouth every afternoon. (Patient taking differently: Take 40 mg by mouth daily. ) 30 capsule 6  . predniSONE (DELTASONE) 10 MG tablet 3 tabs by mouth per day for 3 days,2tabs per day for 3 days,1tab per day for 3 days 18 tablet 0  . predniSONE (DELTASONE) 20 MG tablet Take 1 tablet (20 mg total) by mouth daily with breakfast. 60 tablet 1  . tamsulosin (FLOMAX) 0.4 MG CAPS capsule Take 1 capsule (0.4 mg total) by mouth daily. 30 capsule 3   No current facility-administered medications on file prior to visit.     Review of Systems  Constitutional: Negative for unusual diaphoresis or night sweats HENT: Negative for ear swelling or discharge Eyes: Negative for worsening visual haziness  Respiratory: Negative for choking and stridor.   Gastrointestinal: Negative for distension or worsening eructation Genitourinary: Negative for retention or change in urine volume.  Musculoskeletal: Negative for other MSK pain or swelling Skin: Negative for color change and worsening wound Neurological: Negative for tremors and numbness other than noted  Psychiatric/Behavioral: Negative for decreased concentration or agitation other than above   All other system neg per pt    Objective:   Physical Exam BP 140/80   Pulse 82   Temp 98.7 F (37.1 C) (Oral)   Resp 20   Wt 218 lb (98.9 kg)   SpO2 98%   BMI 27.25 kg/m  VS noted,  Constitutional: Pt appears in no apparent distress HENT: Head: NCAT.  Right Ear: External ear  normal.  Left Ear: External ear normal.  Eyes: . Pupils are equal, round, and reactive to light. Conjunctivae and EOM are normal Bilat tm's with mild erythema.  Max sinus areas non tender.  Pharynx with mild erythema, no exudate Neck: Normal range of motion. Neck supple.  Cardiovascular: Normal rate and regular rhythm.   Pulmonary/Chest: Effort normal and breath sounds decreased without rales or wheezing.  Abd:  Soft, NT, ND, + BS Neurological: Pt is alert. Not confused , motor grossly intact Skin: Skin is warm. No rash, no LE edema Psychiatric: Pt behavior is normal. No agitation.     Assessment & Plan:

## 2016-11-01 NOTE — Assessment & Plan Note (Signed)
C/w likely peripheral vertigo, for meclizine prn,  to f/u any worsening symptoms or concerns

## 2016-11-01 NOTE — Assessment & Plan Note (Signed)
stable overall by history and exam, recent data reviewed with pt, and pt to continue medical treatment as before,  to f/u any worsening symptoms or concerns Lab Results  Component Value Date   HGBA1C 6.7 (H) 04/25/2016    

## 2016-11-01 NOTE — Assessment & Plan Note (Signed)
stable overall by history and exam, recent data reviewed with pt, and pt to continue medical treatment as before,  to f/u any worsening symptoms or concerns BP Readings from Last 3 Encounters:  10/30/16 140/80  09/17/16 138/80  08/29/16 (!) 143/82

## 2016-11-01 NOTE — Assessment & Plan Note (Signed)
stable overall by history and exam, recent data reviewed with pt, and pt to refill symbicort per pt request,  to f/u any worsening symptoms or concerns @LASTSAO2 (3)@

## 2016-11-03 ENCOUNTER — Telehealth: Payer: Self-pay | Admitting: Hematology and Oncology

## 2016-11-03 NOTE — Telephone Encounter (Signed)
Called pt to r/s 11/16 appt due to md pal. Pt says he called and lvm asking to cx appt due to personal reasons and says he is not having any medical problems. He says he will call back later to r/s. 11/16 appt canceled.

## 2016-11-06 ENCOUNTER — Ambulatory Visit: Payer: PPO | Admitting: Hematology and Oncology

## 2016-11-17 ENCOUNTER — Other Ambulatory Visit: Payer: Self-pay | Admitting: Hematology and Oncology

## 2016-11-19 ENCOUNTER — Telehealth: Payer: Self-pay | Admitting: Hematology and Oncology

## 2016-11-19 NOTE — Telephone Encounter (Signed)
sw pt to cnfirm Dec appts date/times per LOS. Pt stated he may have to call back to r/s appts

## 2016-11-24 ENCOUNTER — Other Ambulatory Visit (HOSPITAL_BASED_OUTPATIENT_CLINIC_OR_DEPARTMENT_OTHER): Payer: PPO

## 2016-11-24 DIAGNOSIS — D68318 Other hemorrhagic disorder due to intrinsic circulating anticoagulants, antibodies, or inhibitors: Secondary | ICD-10-CM

## 2016-11-24 LAB — CBC WITH DIFFERENTIAL/PLATELET
BASO%: 0.3 % (ref 0.0–2.0)
BASOS ABS: 0 10*3/uL (ref 0.0–0.1)
EOS%: 0.4 % (ref 0.0–7.0)
Eosinophils Absolute: 0 10*3/uL (ref 0.0–0.5)
HEMATOCRIT: 45.8 % (ref 38.4–49.9)
HGB: 14.4 g/dL (ref 13.0–17.1)
LYMPH%: 6.2 % — AB (ref 14.0–49.0)
MCH: 25.2 pg — AB (ref 27.2–33.4)
MCHC: 31.5 g/dL — AB (ref 32.0–36.0)
MCV: 80.1 fL (ref 79.3–98.0)
MONO#: 0.9 10*3/uL (ref 0.1–0.9)
MONO%: 9.2 % (ref 0.0–14.0)
NEUT#: 8 10*3/uL — ABNORMAL HIGH (ref 1.5–6.5)
NEUT%: 83.9 % — AB (ref 39.0–75.0)
Platelets: 266 10*3/uL (ref 140–400)
RBC: 5.72 10*6/uL (ref 4.20–5.82)
RDW: 16.5 % — ABNORMAL HIGH (ref 11.0–14.6)
WBC: 9.5 10*3/uL (ref 4.0–10.3)
lymph#: 0.6 10*3/uL — ABNORMAL LOW (ref 0.9–3.3)

## 2016-11-25 LAB — FACTOR 8 INHIBITOR
APTT 1 1 NORMAL PLASMA: 25.2 s (ref 22.9–30.2)
APTT 11 MIX SALINE: 36.8 s
APTT: 25.8 s (ref 22.9–30.2)
FACTOR VIII ACTIVITY: 122 % (ref 57–163)

## 2016-11-30 NOTE — Progress Notes (Signed)
Patient Care Team: Biagio Borg, MD as PCP - General  DIAGNOSIS: Factor VIII inhibitor with severe bruising  CHIEF COMPLIANT: resolution of Leg swelling and burning pain  INTERVAL HISTORY: Richard Hopkins is a 70 year old with above-mentioned history of factor VIII inhibitor which caused profound ecchymosis and bleeding into his leg. He received 4 cycles of Rituxan. He has noticed remarkable improvement in the leg symptoms. The redness and purplish discoloration in his legs has resolved. He does not take any more pain medications. He continues to take Neurontin because it helps some not be dizzy. It was originally prescribed for his neuropathic pain in the legs. He tells me that if he does not take Neurontin he may feel dizzy. He was given meclizine for dizziness as well.  REVIEW OF SYSTEMS:   Constitutional: Denies fevers, chills or abnormal weight loss Eyes: Denies blurriness of vision Ears, nose, mouth, throat, and face: Denies mucositis or sore throat Respiratory: Denies cough, dyspnea or wheezes Cardiovascular: Denies palpitation, chest discomfort Gastrointestinal:  Denies nausea, heartburn or change in bowel habits Skin: Denies abnormal skin rashes Lymphatics: Denies new lymphadenopathy or easy bruising Neurological:Denies numbness, tingling or new weaknesses Behavioral/Psych: Mood is stable, no new changes  Extremities: Discoloration resolved All other systems were reviewed with the patient and are negative.  I have reviewed the past medical history, past surgical history, social history and family history with the patient and they are unchanged from previous note.  ALLERGIES:  is allergic to doxycycline; ace inhibitors; cardura [doxazosin mesylate]; cozaar [losartan potassium]; dexilant [dexlansoprazole]; hydrocodone; lisinopril; norvasc [amlodipine besylate]; and alfuzosin.  MEDICATIONS:  Current Outpatient Prescriptions  Medication Sig Dispense Refill  . albuterol  (PROAIR HFA) 108 (90 BASE) MCG/ACT inhaler 2 puffs every 4 hours as needed only  if your can't catch your breath (Patient taking differently: Inhale 1-2 puffs into the lungs every 4 (four) hours as needed for wheezing or shortness of breath. ) 1 Inhaler 11  . budesonide (PULMICORT) 0.25 MG/2ML nebulizer solution Take 2 mLs (0.25 mg total) by nebulization 2 (two) times daily. DX: J44.9 120 mL 11  . budesonide-formoterol (SYMBICORT) 160-4.5 MCG/ACT inhaler Inhale 2 puffs into the lungs 2 (two) times daily. 1 Inhaler 12  . cloNIDine (CATAPRES) 0.1 MG tablet Take 1 tablet (0.1 mg total) by mouth 2 (two) times daily. 270 tablet 1  . formoterol (PERFOROMIST) 20 MCG/2ML nebulizer solution Take 2 mLs (20 mcg total) by nebulization 2 (two) times daily. DX: J44.9 120 mL 11  . gabapentin (NEURONTIN) 100 MG capsule Take 1 capsule (100 mg total) by mouth at bedtime. 90 capsule 0  . ipratropium-albuterol (DUONEB) 0.5-2.5 (3) MG/3ML SOLN Take 3 mLs by nebulization every 6 (six) hours as needed (wheezing/shortness of breath). 60 mL 11  . meclizine (ANTIVERT) 12.5 MG tablet Take 1 tablet (12.5 mg total) by mouth 3 (three) times daily as needed for dizziness. 60 tablet 1  . metFORMIN (GLUCOPHAGE) 500 MG tablet TAKE ONE TABLET BY MOUTH TWICE DAILY WITH  A  MEAL (Patient taking differently: Take 500 mg by mouth 2 (two) times daily with a meal. ) 180 tablet 3  . omeprazole (PRILOSEC) 40 MG capsule Take 1 capsule by mouth every afternoon. (Patient taking differently: Take 40 mg by mouth daily. ) 30 capsule 6  . tamsulosin (FLOMAX) 0.4 MG CAPS capsule Take 1 capsule (0.4 mg total) by mouth daily. 30 capsule 3   No current facility-administered medications for this visit.     PHYSICAL EXAMINATION: ECOG  PERFORMANCE STATUS: 0 - Asymptomatic  Vitals:   12/01/16 1005  BP: (!) 144/85  Pulse: 74  Resp: 16  Temp: 97.4 F (36.3 C)   Filed Weights   12/01/16 1005  Weight: 215 lb 8 oz (97.8 kg)    GENERAL:alert, no  distress and comfortable SKIN: skin color, texture, turgor are normal, no rashes or significant lesions EYES: normal, Conjunctiva are pink and non-injected, sclera clear OROPHARYNX:no exudate, no erythema and lips, buccal mucosa, and tongue normal  NECK: supple, thyroid normal size, non-tender, without nodularity LYMPH:  no palpable lymphadenopathy in the cervical, axillary or inguinal LUNGS: clear to auscultation and percussion with normal breathing effort HEART: regular rate & rhythm and no murmurs and no lower extremity edema ABDOMEN:abdomen soft, non-tender and normal bowel sounds MUSCULOSKELETAL:no cyanosis of digits and no clubbing  NEURO: alert & oriented x 3 with fluent speech, no focal motor/sensory deficits EXTREMITIES: No lower extremity edema  LABORATORY DATA:  I have reviewed the data as listed   Chemistry      Component Value Date/Time   NA 139 08/22/2016 0929   K 4.2 08/22/2016 0929   CL 102 08/07/2016 0403   CO2 22 08/22/2016 0929   BUN 18.1 08/22/2016 0929   CREATININE 1.3 08/22/2016 0929      Component Value Date/Time   CALCIUM 9.5 08/22/2016 0929   ALKPHOS 67 08/22/2016 0929   AST 13 08/22/2016 0929   ALT 11 08/22/2016 0929   BILITOT 0.37 08/22/2016 0929       Lab Results  Component Value Date   WBC 9.5 11/24/2016   HGB 14.4 11/24/2016   HCT 45.8 11/24/2016   MCV 80.1 11/24/2016   PLT 266 11/24/2016   NEUTROABS 8.0 (H) 11/24/2016    ASSESSMENT & PLAN:  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 He was then treated with prednisone and rituximab. This led to complete resolution of his symptoms. Diagnostic workup proved factor VIII inhibitor is still present currently  Prior treatment: IV rituximab weekly 4 started 08/01/2016 completed 08/29/16 (along with tapering schedule of prednisone);  treatment was held on 08/08/2016 due to hospitalization (08/06/2016 - 08/08/2016) No platelet function abnormalities were detected.  Profound leg swelling with pain and redness: It had improved with antibiotics, diuretics and rituximab. Ultrasound of the leg was negative for blood clots. 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 He may need another MRI. I will await neurosurgery recommendations regarding this. Patient requests refill of Neurontin. I sent a refill for 100 mg tablets. Previously was on Neurontin for his leg neuropathic pain. I would like to taper down and discontinue over the next 2 months.   No orders of the defined types were placed in this encounter.  The patient has a good understanding of the overall plan. he agrees with it. he will call with any problems that may develop before the next visit here.   Rulon Eisenmenger, MD 12/01/16

## 2016-11-30 NOTE — Assessment & Plan Note (Signed)
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 He was then treated with prednisone and rituximab. This led to complete resolution of his symptoms. Diagnostic workup proved factor VIII inhibitor is still present currently  Prior treatment: IV rituximab weekly 4 started 08/01/2016 completed 08/29/16 (along with tapering schedule of prednisone); treatment was held on 08/08/2016 due to hospitalization (08/06/2016 - 08/08/2016) No platelet function abnormalities were detected.  Profound leg swelling with pain and redness: It had improved with antibiotics, diuretics and rituximab. Ultrasound of the leg was negative for blood clots. Factor VIII activity is 122% (was 2% on 08/22/16)  Leg pain:Resolved  RTC as needed

## 2016-12-01 ENCOUNTER — Ambulatory Visit (HOSPITAL_BASED_OUTPATIENT_CLINIC_OR_DEPARTMENT_OTHER): Payer: PPO | Admitting: Hematology and Oncology

## 2016-12-01 ENCOUNTER — Encounter: Payer: Self-pay | Admitting: Hematology and Oncology

## 2016-12-01 DIAGNOSIS — D68318 Other hemorrhagic disorder due to intrinsic circulating anticoagulants, antibodies, or inhibitors: Secondary | ICD-10-CM

## 2016-12-01 DIAGNOSIS — C719 Malignant neoplasm of brain, unspecified: Secondary | ICD-10-CM

## 2016-12-01 MED ORDER — GABAPENTIN 100 MG PO CAPS: 100.0000 mg | ORAL_CAPSULE | Freq: Every day | ORAL | 0 refills | Status: DC

## 2016-12-01 NOTE — Assessment & Plan Note (Signed)
I requested the patient to make an appointment to see neurosurgery. He may need another MRI. I will await neurosurgery recommendations regarding this. Patient requests refill of Neurontin. I sent a refill for 100 mg tablets. Previously was on Neurontin for his leg neuropathic pain. I would like to taper down and discontinue over the next 2 months.

## 2016-12-28 ENCOUNTER — Other Ambulatory Visit: Payer: Self-pay | Admitting: Internal Medicine

## 2017-01-01 ENCOUNTER — Other Ambulatory Visit: Payer: Self-pay | Admitting: Internal Medicine

## 2017-01-01 NOTE — Telephone Encounter (Signed)
Please consider OV 

## 2017-01-02 ENCOUNTER — Other Ambulatory Visit: Payer: Self-pay | Admitting: Hematology and Oncology

## 2017-01-03 ENCOUNTER — Other Ambulatory Visit: Payer: Self-pay | Admitting: Hematology and Oncology

## 2017-01-06 ENCOUNTER — Other Ambulatory Visit: Payer: Self-pay | Admitting: Hematology and Oncology

## 2017-01-08 ENCOUNTER — Other Ambulatory Visit: Payer: Self-pay | Admitting: Hematology and Oncology

## 2017-01-13 ENCOUNTER — Encounter: Payer: Self-pay | Admitting: Internal Medicine

## 2017-01-13 ENCOUNTER — Ambulatory Visit (INDEPENDENT_AMBULATORY_CARE_PROVIDER_SITE_OTHER): Payer: PPO | Admitting: Internal Medicine

## 2017-01-13 VITALS — BP 140/80 | HR 100 | Temp 98.1°F | Resp 20 | Wt 217.0 lb

## 2017-01-13 DIAGNOSIS — M519 Unspecified thoracic, thoracolumbar and lumbosacral intervertebral disc disorder: Secondary | ICD-10-CM

## 2017-01-13 DIAGNOSIS — J45909 Unspecified asthma, uncomplicated: Secondary | ICD-10-CM | POA: Insufficient documentation

## 2017-01-13 DIAGNOSIS — M7062 Trochanteric bursitis, left hip: Secondary | ICD-10-CM | POA: Diagnosis not present

## 2017-01-13 DIAGNOSIS — M7072 Other bursitis of hip, left hip: Secondary | ICD-10-CM | POA: Insufficient documentation

## 2017-01-13 MED ORDER — CEPHALEXIN 500 MG PO CAPS: 500.0000 mg | ORAL_CAPSULE | Freq: Three times a day (TID) | ORAL | 0 refills | Status: AC

## 2017-01-13 MED ORDER — PREDNISONE 10 MG PO TABS: ORAL_TABLET | ORAL | 1 refills | Status: DC

## 2017-01-13 MED ORDER — TRAMADOL HCL 50 MG PO TABS: 50.0000 mg | ORAL_TABLET | Freq: Three times a day (TID) | ORAL | 1 refills | Status: DC | PRN

## 2017-01-13 NOTE — Progress Notes (Signed)
Pre visit review using our clinic review tool, if applicable. No additional management support is needed unless otherwise documented below in the visit note. 

## 2017-01-13 NOTE — Progress Notes (Signed)
Subjective:    Patient ID: Richard Hopkins, male    DOB: Sep 28, 1946, 71 y.o.   MRN: [id number]  HPI  Here with acute onset mild to mod 2-3 days ST, HA, general weakness and malaise, with prod cough brownish green sputum, but Pt denies chest pain, increased sob or doe, wheezing, orthopnea, PND, increased LE swelling, palpitations, dizziness or syncope, except for onset mild wheezing/sob since yesterday.  Only taking prn symbicort due to cost, cannot afford.  Pt continues to have recurring LBP without change in severity, bowel or bladder change, fever, wt loss,  worsening LE pain/numbness/weakness, gait change or falls.  Also with tender over left lateral hip area, worse to walk or lie on the left.  No other new history Past Medical History:  Diagnosis Date  . ABNORMAL ELECTROCARDIOGRAM   . Acute prostatitis   . ADENOMATOUS COLONIC POLYP   . ALLERGIC RHINITIS   . BENIGN PROSTATIC HYPERTROPHY   . BLURRED VISION   . Brain tumor ([place])   . Cardiomyopathy ([facility]) 01/12/2016  . CHRONIC OBSTRUCTIVE PULMONARY DISEASE, ACUTE EXACERBATION   . Chronic steroid use   . COPD   . COPD (chronic obstructive pulmonary disease) ([facility] [place])   . DIABETES MELLITUS, TYPE II   . DIVERTICULAR DISEASE   . DYSPNEA   . DYSPNEA/SHORTNESS OF BREATH   . EPIDIDYMO-ORCHITIS   . ERECTILE DYSFUNCTION, ORGANIC, HX OF   . ESOPHAGEAL STRICTURE   . FATIGUE   . GERD   . HYPERLIPIDEMIA   . HYPERTENSION   . LUMBAR DISC DISORDER   . MASS, SUPERFICIAL   . MUSCLE CRAMPS   . Nocturia   . Pain in soft tissues of limb   . PSA, INCREASED   . PULMONARY FIBROSIS   . SKIN LESION   . SMOKER   . SYNCOPE   . THRUSH   . Wheezing    Past Surgical History:  Procedure Laterality Date  . BACK SURGERY     x 4 disabled after 2003  . CARDIAC CATHETERIZATION N/A 03/26/2016   Procedure: Left Heart Cath and Coronary Angiography;  Surgeon: Josue Hector, MD;  Location: [facility];  Service: Cardiovascular;  Laterality: N/A;  . ELBOW  ARTHROSCOPY      reports that he has been smoking Cigarettes.  He has a 50.00 pack-year smoking history. He has never used smokeless tobacco. He reports that he does not drink alcohol or use drugs. family history includes Asthma in his mother; Diabetes in his mother; Emphysema in his mother. Allergies  Allergen Reactions  . Doxycycline Nausea Only and Other (See Comments)    Loss of memory; "bad feeling," dizziness  . Ace Inhibitors Cough  . Cardura [Doxazosin Mesylate] Other (See Comments)    dizziness  . Cozaar [Losartan Potassium] Other (See Comments)    dizziness  . Dexilant [Dexlansoprazole] Itching and Rash  . Hydrocodone Nausea Only and Other (See Comments)    dizziness  . Lisinopril Cough  . Norvasc [Amlodipine Besylate] Other (See Comments)    3 times dizzy, bad taste in mouth, none since stopped  . Alfuzosin Rash   Current Outpatient Prescriptions on File Prior to Visit  Medication Sig Dispense Refill  . albuterol (PROAIR HFA) 108 (90 BASE) MCG/ACT inhaler 2 puffs every 4 hours as needed only  if your can't catch your breath (Patient taking differently: Inhale 1-2 puffs into the lungs every 4 (four) hours as needed for wheezing or shortness of breath. ) 1 Inhaler 11  .  budesonide (PULMICORT) 0.25 MG/2ML nebulizer solution Take 2 mLs (0.25 mg total) by nebulization 2 (two) times daily. DX: J44.9 120 mL 11  . budesonide-formoterol (SYMBICORT) 160-4.5 MCG/ACT inhaler Inhale 2 puffs into the lungs 2 (two) times daily. 1 Inhaler 12  . cloNIDine (CATAPRES) 0.1 MG tablet Take 1 tablet (0.1 mg total) by mouth 2 (two) times daily. 270 tablet 1  . formoterol (PERFOROMIST) 20 MCG/2ML nebulizer solution Take 2 mLs (20 mcg total) by nebulization 2 (two) times daily. DX: J44.9 120 mL 11  . furosemide (LASIX) 20 MG tablet Take 20 mg by mouth daily.    Marland Kitchen gabapentin (NEURONTIN) 100 MG capsule Take 1 capsule (100 mg total) by mouth at bedtime. 90 capsule 0  . ipratropium-albuterol (DUONEB)  0.5-2.5 (3) MG/3ML SOLN Take 3 mLs by nebulization every 6 (six) hours as needed (wheezing/shortness of breath). 60 mL 11  . meclizine (ANTIVERT) 12.5 MG tablet Take 1 tablet (12.5 mg total) by mouth 3 (three) times daily as needed for dizziness. 60 tablet 1  . metFORMIN (GLUCOPHAGE) 500 MG tablet TAKE ONE TABLET BY MOUTH TWICE DAILY WITH  A  MEAL (Patient taking differently: Take 500 mg by mouth 2 (two) times daily with a meal. ) 180 tablet 3  . omeprazole (PRILOSEC) 40 MG capsule Take 1 capsule by mouth every afternoon. (Patient taking differently: Take 40 mg by mouth daily. ) 30 capsule 6  . tamsulosin (FLOMAX) 0.4 MG CAPS capsule Take 1 capsule (0.4 mg total) by mouth daily. 30 capsule 3   No current facility-administered medications on file prior to visit.    Review of Systems  Constitutional: Negative for unusual diaphoresis or night sweats HENT: Negative for ear swelling or discharge Eyes: Negative for worsening visual haziness  Respiratory: Negative for choking and stridor.   Gastrointestinal: Negative for distension or worsening eructation Genitourinary: Negative for retention or change in urine volume.  Musculoskeletal: Negative for other MSK pain or swelling Skin: Negative for color change and worsening wound Neurological: Negative for tremors and numbness other than noted  Psychiatric/Behavioral: Negative for decreased concentration or agitation other than above   All other system neg per pt    Objective:   Physical Exam BP 140/80   Pulse 100   Temp 98.1 F (36.7 C) (Oral)   Resp 20   Wt 217 lb (98.4 kg)   SpO2 93%   BMI 27.12 kg/m  VS noted, mild ill Constitutional: Pt appears in no apparent distress HENT: Head: NCAT.  Right Ear: External ear normal.  Left Ear: External ear normal.  Eyes: . Pupils are equal, round, and reactive to light. Conjunctivae and EOM are normal Bilat tm's with mild erythema.  Max sinus areas non tender.  Pharynx with mild erythema, no  exudate Neck: Normal range of motion. Neck supple.  Cardiovascular: Normal rate and regular rhythm.   Pulmonary/Chest: Effort normal and breath sounds decreased without rales but with few scattered wheezing.  Abd:  Soft, NT, ND, + BS + tender over left lateral greater trochanter Spine nontender Neurological: Pt is alert. Not confused , motor grossly intact Skin: Skin is warm. No rash, no LE edema Psychiatric: Pt behavior is normal. No agitation.  No other new exam findings    Assessment & Plan:

## 2017-01-13 NOTE — Patient Instructions (Signed)
You had the steroid shot today  Please take all new medication as prescribed - the antibiotic, and prednisone, as well as the pain medication  Please continue all other medications as before, and refills have been done if requested.  Please have the pharmacy call with any other refills you may need.  Please keep your appointments with your specialists as you may have planned

## 2017-01-19 NOTE — Assessment & Plan Note (Signed)
Mild to mod, for antibx course, cough med prn, prednisone, symbicort or similar if able,  to f/u any worsening symptoms or concerns

## 2017-01-19 NOTE — Assessment & Plan Note (Signed)
Hopefully to improve with prednisone as well, consider sport med referral

## 2017-01-19 NOTE — Assessment & Plan Note (Signed)
With some neuritic pain improved with neurontin, to continue this, and tramadol prn

## 2017-01-22 ENCOUNTER — Other Ambulatory Visit: Payer: Self-pay | Admitting: Internal Medicine

## 2017-01-23 ENCOUNTER — Encounter: Payer: Self-pay | Admitting: Family Medicine

## 2017-01-23 ENCOUNTER — Ambulatory Visit (INDEPENDENT_AMBULATORY_CARE_PROVIDER_SITE_OTHER): Payer: PPO | Admitting: Family Medicine

## 2017-01-23 ENCOUNTER — Ambulatory Visit (INDEPENDENT_AMBULATORY_CARE_PROVIDER_SITE_OTHER)
Admission: RE | Admit: 2017-01-23 | Discharge: 2017-01-23 | Disposition: A | Discharge status: Home / Self Care | Payer: PPO | Source: Ambulatory Visit | Attending: Family Medicine | Admitting: Family Medicine

## 2017-01-23 VITALS — BP 170/90 | HR 76 | Ht 74.0 in | Wt 224.0 lb

## 2017-01-23 DIAGNOSIS — M545 Low back pain: Secondary | ICD-10-CM | POA: Diagnosis not present

## 2017-01-23 DIAGNOSIS — M5416 Radiculopathy, lumbar region: Secondary | ICD-10-CM

## 2017-01-23 DIAGNOSIS — Z23 Encounter for immunization: Secondary | ICD-10-CM

## 2017-01-23 DIAGNOSIS — M25552 Pain in left hip: Secondary | ICD-10-CM | POA: Diagnosis not present

## 2017-01-23 MED ORDER — METHYLPREDNISOLONE ACETATE 80 MG/ML IJ SUSP
80.0000 mg | Freq: Once | INTRAMUSCULAR | Status: AC
  Administered 2017-01-23: 80 mg via INTRAMUSCULAR

## 2017-01-23 MED ORDER — GABAPENTIN 100 MG PO CAPS: 200.0000 mg | ORAL_CAPSULE | Freq: Every day | ORAL | 3 refills | Status: DC

## 2017-01-23 MED ORDER — KETOROLAC TROMETHAMINE 60 MG/2ML IM SOLN
60.0000 mg | Freq: Once | INTRAMUSCULAR | Status: AC
  Administered 2017-01-23: 60 mg via INTRAMUSCULAR

## 2017-01-23 NOTE — Progress Notes (Signed)
[facility] [place], [place] [address] Phone: [phone] Subjective:    I'm seeing this patient by the request  of:    CC: left leg pain   [id number]  Richard Hopkins is a 71 y.o. male coming in with complaint of left leg pain for 2 weeks. Patient past medical history significant for multiple back surgeries. Patient states unfortunately having pain going down the left leg and is having weakness. This is worse than usual. States that the weakness is been going on weeks and does not seem to be getting better. Patient is now have any true injury. Patient denies any bowel or bladder incontinence, denies any fevers chills or any abnormal weight loss. Worsening pain on the left side of the back as well. Not responding to over-the-counter medications.    previous imaging is an MRI back from May 2015. This was independently visualized by me today. Patient has had multiple laminectomies previously. Has left lateral recess narrowing at L4-L5 with severe foraminal narrowing as well. Moderate spinal stenosis at L4 L3.  Past Medical History:  Diagnosis Date  . ABNORMAL ELECTROCARDIOGRAM   . Acute prostatitis   . ADENOMATOUS COLONIC POLYP   . ALLERGIC RHINITIS   . BENIGN PROSTATIC HYPERTROPHY   . BLURRED VISION   . Brain tumor ([facility])   . Cardiomyopathy ([facility]) 01/12/2016  . CHRONIC OBSTRUCTIVE PULMONARY DISEASE, ACUTE EXACERBATION   . Chronic steroid use   . COPD   . COPD (chronic obstructive pulmonary disease) ([place])   . DIABETES MELLITUS, TYPE II   . DIVERTICULAR DISEASE   . DYSPNEA   . DYSPNEA/SHORTNESS OF BREATH   . EPIDIDYMO-ORCHITIS   . ERECTILE DYSFUNCTION, ORGANIC, HX OF   . ESOPHAGEAL STRICTURE   . FATIGUE   . GERD   . HYPERLIPIDEMIA   . HYPERTENSION   . LUMBAR DISC DISORDER   . MASS, SUPERFICIAL   . MUSCLE CRAMPS   . Nocturia   . Pain in soft tissues of limb   . PSA, INCREASED   . PULMONARY FIBROSIS   . SKIN LESION   . SMOKER     . SYNCOPE   . THRUSH   . Wheezing    Past Surgical History:  Procedure Laterality Date  . BACK SURGERY     x 4 disabled after 2003  . CARDIAC CATHETERIZATION N/A 03/26/2016   Procedure: Left Heart Cath and Coronary Angiography;  Surgeon: Josue Hector, MD;  Location: [facility];  Service: Cardiovascular;  Laterality: N/A;  . ELBOW ARTHROSCOPY     Social History   Social History  . Marital status: Married    Spouse name: N/A  . Number of children: 5  . Years of education: N/A   Occupational History  . disabled Retired   Social History Main Topics  . Smoking status: Current Every Day Smoker    Packs/day: 1.00    Years: 50.00    Types: Cigarettes  . Smokeless tobacco: Never Used  . Alcohol use No  . Drug use: No  . Sexual activity: Not Asked   Other Topics Concern  . 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 Doxycycline Nausea Only and Other (See Comments)    Loss of memory; "bad feeling," dizziness  .  Ace Inhibitors Cough  . Cardura [Doxazosin Mesylate] Other (See Comments)    dizziness  . Cozaar [Losartan Potassium] Other (See Comments)    dizziness  . Dexilant [Dexlansoprazole] Itching and Rash  . Hydrocodone Nausea Only and Other (See Comments)    dizziness  . Lisinopril Cough  . Norvasc [Amlodipine Besylate] Other (See Comments)    3 times dizzy, bad taste in mouth, none since stopped  . Alfuzosin Rash   Family History  Problem Relation Age of Onset  . Diabetes Mother   . Asthma Mother   . Emphysema Mother     smoker  . Colon cancer Neg Hx     Past medical history, social, surgical and family history all reviewed in electronic medical record.  No pertanent information unless stated regarding to the chief complaint.   Review of Systems:Review of systems updated and as  accurate as of 01/23/17  No headache, visual changes, nausea, vomiting, diarrhea, constipation, dizziness, abdominal pain, skin rash, fevers, chills, night sweats, weight loss, swollen lymph nodes, body aches, joint swelling, muscle aches, chest pain, shortness of breath, mood changes. Positive for  left-sided weakness  Objective  Blood pressure (!) 170/90, pulse 76, height 6\' 2"  (1.88 m), weight 224 lb (101.6 kg), SpO2 99 %. Systems examined below as of 01/23/17   General: No apparent distress alert and oriented x3 mood and affect normal, dressed appropriately.  HEENT: Pupils equal, extraocular movements intact  Respiratory: Patient's speak in full sentences and does not appear short of breath  Cardiovascular: No lower extremity edema, non tender, no erythema  Skin: Warm dry intact with no signs of infection or rash on extremities or on axial skeleton.  Abdomen: Soft nontender  Neuro: Cranial nerves II through XII are intact, neurovascularly intact in all extremities with 2+ DTRs and 2+ pulses.  Lymph: No lymphadenopathy of posterior or anterior cervical chain or axillae bilaterally.  Gait antalgic gait MSK:  Non tender with full range of motion and good stability and symmetric strength and tone of shoulders, elbows, wrist, hip, knee and ankles bilaterally.  Back Exam:  Inspection:Loss of lordosis  Motion: Flexion 30 deg, Extension 15 deg, Side Bending to 25 deg bilaterally,  Rotation to 25 deg bilaterally  SLR laying: Positive XSLR laying: Negative  Palpable tenderness: Tender to palpation and appears palmar musculature of the lumbar spine bilaterally.Marland Kitchen FABER: negative. Sensory change: Gross sensation intact to all lumbar and sacral dermatomes.  Reflexes: 2+ at both patellar tendons, 2+ at achilles tendons, Babinski's downgoing.  Strength at foot  Plantar-flexion: 5/5 Dorsi-flexion: 5/5 Eversion: 5/5 Inversion: 5/5  Leg strength  Quad: 3/5 on left with near full strength on the right  sign. Hamstring: 5/5 Hip flexor: 4/5 strength of the left side compared to the right sign. Hip abductors: 4/5  Gait unremarkable.     Impression and Recommendations:     This case required medical decision making of moderate complexity.      Note: This dictation was prepared with Dragon dictation along with smaller phrase technology. Any transcriptional errors that result from this process are unintentional.

## 2017-01-23 NOTE — Patient Instructions (Addendum)
Go see you  2 injections today  Xray downstairs today  Ice 20 minutes 2 times daily. Usually after activity and before bed. Gabapentin 200mg  at night may help with the nerve pain and help with the weakness.  See me again in 1 week to make sure better. (OK to double book) If significnat worse over the weekend please go to ER.

## 2017-01-23 NOTE — Assessment & Plan Note (Signed)
Worsening symptoms. Patient is actually truly having weakness on the left side. Patient's MRI greater than 2 years ago did show some nerve root impingement. Patient last time seen him was greater than 2 years ago and was having radicular symptoms more on the contralateral side. At this time we'll get a repeat x-ray to see if there is any significant bony normality that can be contributing. Started on medications per orders. Given 2 injections today. Following up again in 1 week. No significant improvement or worsening symptoms advance imaging would be warranted. Patient to go to the emergency room if worsening over the weekend.

## 2017-01-26 ENCOUNTER — Other Ambulatory Visit: Payer: Self-pay | Admitting: Internal Medicine

## 2017-01-26 ENCOUNTER — Ambulatory Visit: Payer: PPO | Admitting: Family Medicine

## 2017-02-01 ENCOUNTER — Other Ambulatory Visit: Payer: Self-pay | Admitting: Internal Medicine

## 2017-02-01 NOTE — Progress Notes (Signed)
[facility] [place], [place] [address] Phone: [phone] Subjective:    I'm seeing this patient by the request  of:    CC: left leg pain f/u  [id number]  NETHANEL STELTENPOHL is a 71 y.o. male coming in with complaint of left leg pain. Patient was seen by me and did have worsening left leg weakness. Concern for more radicular symptoms. Started on gabapentin and was given 2 injections subcutaneous.   Patient did have x-rays. X-rays were independently visualized by me. X-rays of patient's lumbar spine did not show any significant progression. No hip arthritis noted as well.  Asian states that he is feeling approximately 80% better. The pain is very minimal at this time. Still has some weakness but states that this is not severely different than what he would consider has been fairly normal for the last month or 2. Patient denies any new symptoms such as fever, chills, any abnormal weight loss.   Other imaging.  previous imaging is an MRI back from May 2015. This was independently visualized by me today. Patient has had multiple laminectomies previously. Has left lateral recess narrowing at L4-L5 with severe foraminal narrowing as well. Moderate spinal stenosis at L4 L3.  Past Medical History:  Diagnosis Date  . ABNORMAL ELECTROCARDIOGRAM   . Acute prostatitis   . ADENOMATOUS COLONIC POLYP   . ALLERGIC RHINITIS   . BENIGN PROSTATIC HYPERTROPHY   . BLURRED VISION   . Brain tumor ([facility])   . Cardiomyopathy ([facility]) 01/12/2016  . CHRONIC OBSTRUCTIVE PULMONARY DISEASE, ACUTE EXACERBATION   . Chronic steroid use   . COPD   . COPD (chronic obstructive pulmonary disease) ([facility])   . DIABETES MELLITUS, TYPE II   . DIVERTICULAR DISEASE   . DYSPNEA   . DYSPNEA/SHORTNESS OF BREATH   . EPIDIDYMO-ORCHITIS   . ERECTILE DYSFUNCTION, ORGANIC, HX OF   . ESOPHAGEAL STRICTURE   . FATIGUE   . GERD   . HYPERLIPIDEMIA   . HYPERTENSION   . LUMBAR DISC DISORDER     . MASS, SUPERFICIAL   . MUSCLE CRAMPS   . Nocturia   . Pain in soft tissues of limb   . PSA, INCREASED   . PULMONARY FIBROSIS   . SKIN LESION   . SMOKER   . SYNCOPE   . THRUSH   . Wheezing    Past Surgical History:  Procedure Laterality Date  . BACK SURGERY     x 4 disabled after 2003  . CARDIAC CATHETERIZATION N/A 03/26/2016   Procedure: Left Heart Cath and Coronary Angiography;  Surgeon: Josue Hector, MD;  Location: [facility];  Service: Cardiovascular;  Laterality: N/A;  . ELBOW ARTHROSCOPY     Social History   Social History  . Marital status: Married    Spouse name: N/A  . Number of children: 5  . Years of education: N/A   Occupational History  . disabled Retired   Social History Main Topics  . Smoking status: Current Every Day Smoker    Packs/day: 1.00    Years: 50.00    Types: Cigarettes  . Smokeless tobacco: Never Used  . Alcohol use No  . Drug use: No  . Sexual activity: Not Asked   Other Topics Concern  . 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 Doxycycline Nausea Only and Other (See Comments)    Loss of memory; "bad feeling," dizziness  . Ace Inhibitors Cough  . Cardura [Doxazosin Mesylate] Other (See Comments)    dizziness  . Cozaar [Losartan Potassium] Other (See Comments)    dizziness  . Dexilant [Dexlansoprazole] Itching and Rash  . Hydrocodone Nausea Only and Other (See Comments)    dizziness  . Lisinopril Cough  . Norvasc [Amlodipine Besylate] Other (See Comments)    3 times dizzy, bad taste in mouth, none since stopped  . Alfuzosin Rash   Family History  Problem Relation Age of Onset  . Diabetes Mother   . Asthma Mother   . Emphysema Mother     smoker  . Colon cancer Neg Hx     Past medical history, social, surgical and family history all reviewed  in electronic medical record.  No pertanent information unless stated regarding to the chief complaint.   Review of Systems: No headache, visual changes, nausea, vomiting, diarrhea, constipation, dizziness, abdominal pain, skin rash, fevers, chills, night sweats, weight loss, swollen lymph nodes, body aches, joint swelling, , chest pain, shortness of breath, mood changes.  Mild muscle aches  Objective  Blood pressure (!) 142/90, pulse 72, height 6\' 3"  (1.905 m), weight 218 lb (98.9 kg).   Systems examined below as of 02/02/17 General: NAD A&O x3 mood, affect normal  HEENT: Pupils equal, extraocular movements intact no nystagmus Respiratory: not short of breath at rest or with speaking Cardiovascular: No lower extremity edema, non tender Skin: Warm dry intact with no signs of infection or rash on extremities or on axial skeleton. Abdomen: Soft nontender, no masses Neuro: Cranial nerves  intact, neurovascularly intact in all extremities with 2+ DTRs and 2+ pulses. Lymph: No lymphadenopathy appreciated today  Gait mild antalgic gait but improved MSK: Non tender with full range of motion and good stability and symmetric strength and tone of shoulders, elbows, wrist,  knee hips and ankles bilaterally.  Arthritic changes of multiple joints Back Exam:  Inspection:Loss of lordosis  Motion: Flexion 35 deg, Extension 15 deg, Side Bending to 35 deg bilaterally,  Rotation to 35 deg bilaterally  SLR laying: Positive left sign still noted XSLR laying: Negative  Palpable tenderness: Minimal tenderness to palpation of the paraspinal musculature lumbar spine. FABER: negative. Sensory change: Gross sensation intact to all lumbar and sacral dermatomes.  Reflexes: 2+ at both patellar tendons, 2+ at achilles tendons, Babinski's downgoing.  Strength at foot  Plantar-flexion: 5/5 Dorsi-flexion: 5/5 Eversion: 5/5 Inversion: 5/5  Leg strength  Quad: Nearly 5 out of 5 ad symmetric. Hamstring: 5/5 Hip flexor:  Left-sided hip flexor still 4-5 compared to the contralateral side. Otherwise strength is symmetric    Impression and Recommendations:     This case required medical decision making of moderate complexity.      Note: This dictation was prepared with Dragon dictation along with smaller phrase technology. Any transcriptional errors that result from this process are unintentional.

## 2017-02-02 ENCOUNTER — Encounter: Payer: Self-pay | Admitting: Family Medicine

## 2017-02-02 ENCOUNTER — Ambulatory Visit (INDEPENDENT_AMBULATORY_CARE_PROVIDER_SITE_OTHER): Payer: PPO | Admitting: Family Medicine

## 2017-02-02 DIAGNOSIS — M5416 Radiculopathy, lumbar region: Secondary | ICD-10-CM | POA: Diagnosis not present

## 2017-02-02 NOTE — Assessment & Plan Note (Signed)
Still concerned overall. Patient does have some weakness on the left leg. We discussed advanced imaging which patient declined. Patient states because strength as well as the pain is improved tingling to continue to monitor. 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 Patient could be a candidate for epidurals were discussed. Patient once to continue with conservative therapy and will start increasing his activity. We will call if he has anything such as increasing weakness, numbness, fevers chills or any abnormal weight loss. Otherwise I would like patient to follow-up again in 2 weeks.  Spent  25 minutes with patient face-to-face and had greater than 50% of counseling including as described above in assessment and plan.

## 2017-03-31 ENCOUNTER — Ambulatory Visit (INDEPENDENT_AMBULATORY_CARE_PROVIDER_SITE_OTHER): Payer: PPO | Admitting: Internal Medicine

## 2017-03-31 ENCOUNTER — Encounter: Payer: Self-pay | Admitting: Internal Medicine

## 2017-03-31 ENCOUNTER — Ambulatory Visit (INDEPENDENT_AMBULATORY_CARE_PROVIDER_SITE_OTHER)
Admission: RE | Admit: 2017-03-31 | Discharge: 2017-03-31 | Disposition: A | Discharge status: Home / Self Care | Payer: PPO | Source: Ambulatory Visit | Attending: Internal Medicine | Admitting: Internal Medicine

## 2017-03-31 VITALS — BP 124/94 | HR 77 | Temp 97.5°F | Resp 16 | Ht 75.0 in | Wt 226.0 lb

## 2017-03-31 DIAGNOSIS — M5416 Radiculopathy, lumbar region: Secondary | ICD-10-CM | POA: Diagnosis not present

## 2017-03-31 DIAGNOSIS — M545 Low back pain: Secondary | ICD-10-CM | POA: Diagnosis not present

## 2017-03-31 MED ORDER — OXYCODONE HCL 5 MG PO TABS: 5.0000 mg | ORAL_TABLET | ORAL | 0 refills | Status: AC | PRN

## 2017-03-31 NOTE — Progress Notes (Signed)
Pre visit review using our clinic review tool, if applicable. No additional management support is needed unless otherwise documented below in the visit note. 

## 2017-03-31 NOTE — Progress Notes (Signed)
Subjective:  Patient ID: SERGEY ISHLER, male    DOB: 03-Jun-1946  Age: 71 y.o. MRN: [id number]  CC: Back Pain   HPI ANDEN BARTOLO presents for Concerns about left lower back pain, the pain started about a week ago with no preceding trauma or injury. He describes it as an intermittent throbbing sensation that has not responded to Aleve, ibuprofen, or Tylenol. He has a remote history of multiple back surgeries, an old lumbar vertebral fracture, and degenerative disc disease. The pain radiates towards his left hip but not all the way down the left lower extremity and he denies numbness, weakness, tingling in his lower extremities.  Outpatient Medications Prior to Visit  Medication Sig Dispense Refill  . albuterol (PROAIR HFA) 108 (90 BASE) MCG/ACT inhaler 2 puffs every 4 hours as needed only  if your can't catch your breath (Patient taking differently: Inhale 1-2 puffs into the lungs every 4 (four) hours as needed for wheezing or shortness of breath. ) 1 Inhaler 11  . budesonide (PULMICORT) 0.25 MG/2ML nebulizer solution Take 2 mLs (0.25 mg total) by nebulization 2 (two) times daily. DX: J44.9 120 mL 11  . budesonide-formoterol (SYMBICORT) 160-4.5 MCG/ACT inhaler Inhale 2 puffs into the lungs 2 (two) times daily. 1 Inhaler 12  . cloNIDine (CATAPRES) 0.1 MG tablet Take 1 tablet (0.1 mg total) by mouth 2 (two) times daily. 270 tablet 1  . formoterol (PERFOROMIST) 20 MCG/2ML nebulizer solution Take 2 mLs (20 mcg total) by nebulization 2 (two) times daily. DX: J44.9 120 mL 11  . furosemide (LASIX) 20 MG tablet Take 20 mg by mouth daily.    Marland Kitchen gabapentin (NEURONTIN) 100 MG capsule Take 2 capsules (200 mg total) by mouth at bedtime. 60 capsule 3  . ipratropium-albuterol (DUONEB) 0.5-2.5 (3) MG/3ML SOLN Take 3 mLs by nebulization every 6 (six) hours as needed (wheezing/shortness of breath). 60 mL 11  . meclizine (ANTIVERT) 12.5 MG tablet TAKE ONE TABLET BY MOUTH THREE TIMES DAILY AS NEEDED FOR  DIZZINESS 60 tablet 1  . metFORMIN (GLUCOPHAGE) 500 MG tablet TAKE ONE TABLET BY MOUTH TWICE DAILY WITH A MEAL 180 tablet 3  . omeprazole (PRILOSEC) 40 MG capsule Take 1 capsule by mouth every afternoon. (Patient taking differently: Take 40 mg by mouth daily. ) 30 capsule 6  . predniSONE (DELTASONE) 10 MG tablet 1 tab by mouth every other day 45 tablet 1  . tamsulosin (FLOMAX) 0.4 MG CAPS capsule TAKE ONE CAPSULE BY MOUTH ONCE DAILY 30 capsule 3  . traMADol (ULTRAM) 50 MG tablet Take 1 tablet (50 mg total) by mouth every 8 (eight) hours as needed. 90 tablet 1   No facility-administered medications prior to visit.     ROS Review of Systems  Constitutional: Negative.  Negative for chills, diaphoresis and fever.  HENT: Negative.   Eyes: Negative.   Respiratory: Negative for cough, shortness of breath and wheezing.   Cardiovascular: Negative.  Negative for chest pain, palpitations and leg swelling.  Gastrointestinal: Negative for abdominal pain, diarrhea, nausea and vomiting.  Endocrine: Negative.   Genitourinary: Negative.  Negative for difficulty urinating, flank pain and frequency.  Musculoskeletal: Positive for back pain. Negative for myalgias.  Skin: Negative.  Negative for rash.  Allergic/Immunologic: Negative.   Neurological: Negative.  Negative for dizziness, weakness, light-headedness and numbness.  Hematological: Negative for adenopathy. Does not bruise/bleed easily.  Psychiatric/Behavioral: Negative.     Objective:  BP (!) 124/94 (BP Location: Left Arm, Patient Position: Sitting, Cuff Size: Large)  Pulse 77   Temp 97.5 F (36.4 C) (Oral)   Resp 16   Ht 6\' 3"  (1.905 m)   Wt 226 lb (102.5 kg)   SpO2 99%   BMI 28.25 kg/m   BP Readings from Last 3 Encounters:  03/31/17 (!) 124/94  02/02/17 (!) 142/90  01/23/17 (!) 170/90    Wt Readings from Last 3 Encounters:  03/31/17 226 lb (102.5 kg)  02/02/17 218 lb (98.9 kg)  01/23/17 224 lb (101.6 kg)    Physical Exam    Constitutional: No distress.  HENT:  Mouth/Throat: Oropharynx is clear and moist. No oropharyngeal exudate.  Eyes: Conjunctivae are normal. Right eye exhibits no discharge. Left eye exhibits no discharge. No scleral icterus.  Neck: Normal range of motion. Neck supple. No JVD present. No tracheal deviation present. No thyromegaly present.  Cardiovascular: Normal rate, regular rhythm, normal heart sounds and intact distal pulses.  Exam reveals no gallop and no friction rub.   No murmur heard. Pulmonary/Chest: Effort normal and breath sounds normal. No stridor. No respiratory distress. He has no wheezes. He has no rales. He exhibits no tenderness.  Abdominal: Soft. Bowel sounds are normal. He exhibits no distension and no mass. There is no tenderness. There is no rebound and no guarding.  Musculoskeletal: Normal range of motion. He exhibits no edema, tenderness or deformity.       Lumbar back: Normal. He exhibits normal range of motion, no tenderness, no bony tenderness, no swelling, no edema, no deformity, no laceration and no pain.  Lymphadenopathy:    He has no cervical adenopathy.  Neurological: He has normal strength. He displays no atrophy, no tremor and normal reflexes. No cranial nerve deficit or sensory deficit. He exhibits normal muscle tone. He displays a negative Romberg sign. He displays no seizure activity. Coordination and gait normal.  Reflex Scores:      Tricep reflexes are 0 on the right side and 0 on the left side.      Bicep reflexes are 0 on the right side and 0 on the left side.      Brachioradialis reflexes are 0 on the right side and 0 on the left side.      Patellar reflexes are 0 on the right side and 0 on the left side.      Achilles reflexes are 0 on the right side and 0 on the left side. Neg SLR in BLE  Skin: Skin is warm and dry. No rash noted. He is not diaphoretic. No erythema. No pallor.  Vitals reviewed.   Lab Results  Component Value Date   WBC 9.5  11/24/2016   HGB 14.4 11/24/2016   HCT 45.8 11/24/2016   PLT 266 11/24/2016   GLUCOSE 98 08/22/2016   CHOL 206 (H) 04/25/2016   TRIG 178.0 (H) 04/25/2016   HDL 73.40 04/25/2016   LDLDIRECT 116.0 01/16/2011   LDLCALC 97 04/25/2016   ALT 11 08/22/2016   AST 13 08/22/2016   NA 139 08/22/2016   K 4.2 08/22/2016   CL 102 08/07/2016   CREATININE 1.3 08/22/2016   BUN 18.1 08/22/2016   CO2 22 08/22/2016   TSH 0.52 01/07/2016   PSA 2.04 01/07/2016   INR 0.91 03/24/2016   HGBA1C 6.7 (H) 04/25/2016   MICROALBUR 0.7 01/07/2016    Dg Lumbar Spine Complete  Result Date: 01/23/2017 CLINICAL DATA:  Low back pain and left leg pain for 2 weeks. No known injury. EXAM: LUMBAR SPINE - COMPLETE 4+ VIEW COMPARISON:  04/25/2014 FINDINGS: Mild compression fracture involving L1 vertebral body, stable since prior study. Degenerative disc and facet disease in the lower lumbar spine. Normal alignment. No acute fracture. SI joints are symmetric and unremarkable. IMPRESSION: Stable chronic L1 compression fracture. Spondylosis. No acute findings. Electronically Signed   By: Rolm Baptise M.D.   On: 01/23/2017 09:15   Dg Hip Unilat With Pelvis 2-3 Views Left  Result Date: 01/23/2017 CLINICAL DATA:  Low back pain and left hip pain for 2 weeks. No known injury. EXAM: DG HIP (WITH OR WITHOUT PELVIS) 2-3V LEFT COMPARISON:  None. FINDINGS: No acute bony abnormality. Specifically, no fracture, subluxation, or dislocation. Soft tissues are intact. Hip joint is maintained. IMPRESSION: No acute bony abnormality. Electronically Signed   By: Rolm Baptise M.D.   On: 01/23/2017 09:16   Dg Lumbar Spine Complete  Result Date: 04/01/2017 CLINICAL DATA:  Low back pain extending to the left knee for 7 days. EXAM: LUMBAR SPINE - COMPLETE 4+ VIEW COMPARISON:  01/23/2017 FINDINGS: Remote L1 compression fracture. No evidence of acute fracture. No endplate erosion or evidence of focal bone lesion. Generalized disc narrowing, moderate at  L4-5 and L5-S1 where there is also spurring. Lower lumbar facet arthropathy with hypertrophy. Extensive atherosclerotic calcification of the aorta. IMPRESSION: 1. No acute finding. 2. Remote L1 compression fracture. 3. Disc degeneration and facet arthropathy, most notable at L4-5 and L5-S1. 4.  Aortic Atherosclerosis (ICD10-170.0) Electronically Signed   By: Monte Fantasia M.D.   On: 04/01/2017 07:26    Assessment & Plan:   Jhostin was seen today for back pain.  Diagnoses and all orders for this visit:  Lumbar radiculopathy- his examination is negative for any radicular findings, plain films show chronic/stable changes, will try a course of oxycodone for symptom relief. -     DG Lumbar Spine Complete; Future -     oxyCODONE (OXY IR/ROXICODONE) 5 MG immediate release tablet; Take 1 tablet (5 mg total) by mouth every 4 (four) hours as needed for severe pain.   I have discontinued Mr. Streicher's traMADol. I am also having him start on oxyCODONE. Additionally, I am having him maintain his omeprazole, ipratropium-albuterol, albuterol, formoterol, budesonide, budesonide-formoterol, cloNIDine, furosemide, predniSONE, metFORMIN, gabapentin, tamsulosin, and meclizine.  Meds ordered this encounter  Medications  . oxyCODONE (OXY IR/ROXICODONE) 5 MG immediate release tablet    Sig: Take 1 tablet (5 mg total) by mouth every 4 (four) hours as needed for severe pain.    Dispense:  30 tablet    Refill:  0     Follow-up: Return if symptoms worsen or fail to improve.  Scarlette Calico, MD

## 2017-03-31 NOTE — Patient Instructions (Signed)

## 2017-04-01 ENCOUNTER — Encounter: Payer: Self-pay | Admitting: Internal Medicine

## 2017-04-09 ENCOUNTER — Other Ambulatory Visit: Payer: Self-pay | Admitting: Internal Medicine

## 2017-04-15 ENCOUNTER — Other Ambulatory Visit: Payer: Self-pay | Admitting: Internal Medicine

## 2017-04-24 ENCOUNTER — Ambulatory Visit (INDEPENDENT_AMBULATORY_CARE_PROVIDER_SITE_OTHER): Payer: PPO | Admitting: Internal Medicine

## 2017-04-24 ENCOUNTER — Encounter: Payer: Self-pay | Admitting: Internal Medicine

## 2017-04-24 ENCOUNTER — Other Ambulatory Visit (INDEPENDENT_AMBULATORY_CARE_PROVIDER_SITE_OTHER): Payer: PPO

## 2017-04-24 VITALS — BP 144/90 | HR 97 | Ht 75.0 in | Wt 227.0 lb

## 2017-04-24 DIAGNOSIS — J441 Chronic obstructive pulmonary disease with (acute) exacerbation: Secondary | ICD-10-CM

## 2017-04-24 DIAGNOSIS — Z0001 Encounter for general adult medical examination with abnormal findings: Secondary | ICD-10-CM | POA: Diagnosis not present

## 2017-04-24 DIAGNOSIS — E119 Type 2 diabetes mellitus without complications: Secondary | ICD-10-CM

## 2017-04-24 DIAGNOSIS — S32010D Wedge compression fracture of first lumbar vertebra, subsequent encounter for fracture with routine healing: Secondary | ICD-10-CM | POA: Diagnosis not present

## 2017-04-24 DIAGNOSIS — Z7952 Long term (current) use of systemic steroids: Secondary | ICD-10-CM

## 2017-04-24 DIAGNOSIS — S32010A Wedge compression fracture of first lumbar vertebra, initial encounter for closed fracture: Secondary | ICD-10-CM | POA: Insufficient documentation

## 2017-04-24 LAB — CBC WITH DIFFERENTIAL/PLATELET
BASOS PCT: 0.4 % (ref 0.0–3.0)
Basophils Absolute: 0 10*3/uL (ref 0.0–0.1)
Eosinophils Absolute: 0 10*3/uL (ref 0.0–0.7)
Eosinophils Relative: 0.5 % (ref 0.0–5.0)
HEMATOCRIT: 44.2 % (ref 39.0–52.0)
Hemoglobin: 14.2 g/dL (ref 13.0–17.0)
LYMPHS PCT: 9.8 % — AB (ref 12.0–46.0)
Lymphs Abs: 0.9 10*3/uL (ref 0.7–4.0)
MCHC: 32.2 g/dL (ref 30.0–36.0)
MCV: 83.5 fl (ref 78.0–100.0)
MONOS PCT: 9.1 % (ref 3.0–12.0)
Monocytes Absolute: 0.8 10*3/uL (ref 0.1–1.0)
NEUTROS ABS: 7.4 10*3/uL (ref 1.4–7.7)
Neutrophils Relative %: 80.2 % — ABNORMAL HIGH (ref 43.0–77.0)
PLATELETS: 307 10*3/uL (ref 150.0–400.0)
RBC: 5.3 Mil/uL (ref 4.22–5.81)
RDW: 16.2 % — AB (ref 11.5–15.5)
WBC: 9.2 10*3/uL (ref 4.0–10.5)

## 2017-04-24 LAB — URINALYSIS, ROUTINE W REFLEX MICROSCOPIC
BILIRUBIN URINE: NEGATIVE
Hgb urine dipstick: NEGATIVE
Ketones, ur: NEGATIVE
Leukocytes, UA: NEGATIVE
Nitrite: NEGATIVE
PH: 5.5 (ref 5.0–8.0)
RBC / HPF: NONE SEEN (ref 0–?)
Total Protein, Urine: NEGATIVE
Urine Glucose: NEGATIVE
Urobilinogen, UA: 0.2 (ref 0.0–1.0)

## 2017-04-24 LAB — HEPATIC FUNCTION PANEL
ALK PHOS: 56 U/L (ref 39–117)
ALT: 12 U/L (ref 0–53)
AST: 13 U/L (ref 0–37)
Albumin: 4 g/dL (ref 3.5–5.2)
BILIRUBIN TOTAL: 0.4 mg/dL (ref 0.2–1.2)
Bilirubin, Direct: 0.1 mg/dL (ref 0.0–0.3)
Total Protein: 6.5 g/dL (ref 6.0–8.3)

## 2017-04-24 LAB — BASIC METABOLIC PANEL
BUN: 15 mg/dL (ref 6–23)
CO2: 25 mEq/L (ref 19–32)
Calcium: 9 mg/dL (ref 8.4–10.5)
Chloride: 104 mEq/L (ref 96–112)
Creatinine, Ser: 1.15 mg/dL (ref 0.40–1.50)
GFR: 80.12 mL/min (ref >60.00)
Glucose, Bld: 146 mg/dL — ABNORMAL HIGH (ref 70–99)
POTASSIUM: 4.4 meq/L (ref 3.5–5.1)
SODIUM: 137 meq/L (ref 135–145)

## 2017-04-24 LAB — LIPID PANEL
CHOLESTEROL: 214 mg/dL — AB (ref 0–200)
HDL: 86.7 mg/dL (ref >39.00)
LDL CALC: 102 mg/dL — AB (ref 0–99)
NonHDL: 127.43
Total CHOL/HDL Ratio: 2
Triglycerides: 126 mg/dL (ref 0.0–149.0)
VLDL: 25.2 mg/dL (ref 0.0–40.0)

## 2017-04-24 LAB — TSH: TSH: 0.78 u[IU]/mL (ref 0.35–4.50)

## 2017-04-24 MED ORDER — PREDNISONE 10 MG PO TABS: ORAL_TABLET | ORAL | 1 refills | Status: AC

## 2017-04-24 MED ORDER — CLONIDINE HCL 0.1 MG PO TABS: 0.1000 mg | ORAL_TABLET | Freq: Two times a day (BID) | ORAL | 3 refills | Status: AC

## 2017-04-24 MED ORDER — PREDNISONE 10 MG PO TABS: ORAL_TABLET | ORAL | 1 refills | Status: DC

## 2017-04-24 MED ORDER — METHYLPREDNISOLONE ACETATE 80 MG/ML IJ SUSP
80.0000 mg | Freq: Once | INTRAMUSCULAR | Status: AC
  Administered 2017-04-24: 80 mg via INTRAMUSCULAR

## 2017-04-24 MED ORDER — GABAPENTIN 300 MG PO CAPS: 300.0000 mg | ORAL_CAPSULE | Freq: Every day | ORAL | 1 refills | Status: AC

## 2017-04-24 NOTE — Progress Notes (Signed)
Subjective:    Patient ID: Richard Hopkins, male    DOB: 04-Dec-1946, 71 y.o.   MRN: [id number]  HPI  Here for wellness and f/u;  Overall doing ok;  Pt denies Chest pain, orthopnea, PND, worsening LE edema, palpitations, dizziness or syncope, but has had mild sob/doe/wheezing after prednisone stopped,.  Cannot afford inhalers,  Just wants to feel better.  Pt denies neurological change such as new headache, facial or extremity weakness.  Pt denies polydipsia, polyuria, or low sugar symptoms. Pt states overall good compliance with treatment and medications, good tolerability, and has been trying to follow appropriate diet.  Pt denies worsening depressive symptoms, suicidal ideation or panic. No fever, night sweats, wt loss, loss of appetite, or other constitutional symptoms.  Pt states good ability with ADL's, has low fall risk, home safety reviewed and adequate, no other significant changes in hearing or vision, and not active with exercise.  Still with LBP with radiation to the LLE as per last visit. Plain films done, results on chart. Overall pain some less than last wk, Pt denies bowel or bladder change, fever, wt loss,  worsening LE pain/numbness/weakness, gait change or falls. Oxycodone helped, but not that well. Gabapentin 200 helped at night, asks for higher dosing Past Medical History:  Diagnosis Date  . ABNORMAL ELECTROCARDIOGRAM   . Acute prostatitis   . ADENOMATOUS COLONIC POLYP   . ALLERGIC RHINITIS   . BENIGN PROSTATIC HYPERTROPHY   . BLURRED VISION   . Brain tumor ([place])   . Cardiomyopathy ([place]) 01/12/2016  . CHRONIC OBSTRUCTIVE PULMONARY DISEASE, ACUTE EXACERBATION   . Chronic steroid use   . COPD   . COPD (chronic obstructive pulmonary disease) ([place])   . DIABETES MELLITUS, TYPE II   . DIVERTICULAR DISEASE   . DYSPNEA   . DYSPNEA/SHORTNESS OF BREATH   . EPIDIDYMO-ORCHITIS   . ERECTILE DYSFUNCTION, ORGANIC, HX OF   . ESOPHAGEAL STRICTURE   . FATIGUE   . GERD   .  HYPERLIPIDEMIA   . HYPERTENSION   . LUMBAR DISC DISORDER   . MASS, SUPERFICIAL   . MUSCLE CRAMPS   . Nocturia   . Pain in soft tissues of limb   . PSA, INCREASED   . PULMONARY FIBROSIS   . SKIN LESION   . SMOKER   . SYNCOPE   . THRUSH   . Wheezing    Past Surgical History:  Procedure Laterality Date  . BACK SURGERY     x 4 disabled after 2003  . CARDIAC CATHETERIZATION N/A 03/26/2016   Procedure: Left Heart Cath and Coronary Angiography;  Surgeon: Josue Hector, MD;  Location: [facility];  Service: Cardiovascular;  Laterality: N/A;  . ELBOW ARTHROSCOPY      reports that he has been smoking Cigarettes.  He has a 50.00 pack-year smoking history. He has never used smokeless tobacco. He reports that he does not drink alcohol or use drugs. family history includes Asthma in his mother; Diabetes in his mother; Emphysema in his mother. Allergies  Allergen Reactions  . Doxycycline Nausea Only and Other (See Comments)    Loss of memory; "bad feeling," dizziness  . Ace Inhibitors Cough  . Cardura [Doxazosin Mesylate] Other (See Comments)    dizziness  . Cozaar [Losartan Potassium] Other (See Comments)    dizziness  . Dexilant [Dexlansoprazole] Itching and Rash  . Hydrocodone Nausea Only and Other (See Comments)    dizziness  . Lisinopril Cough  . Norvasc [Amlodipine  Besylate] Other (See Comments)    3 times dizzy, bad taste in mouth, none since stopped  . Alfuzosin Rash   Current Outpatient Prescriptions on File Prior to Visit  Medication Sig Dispense Refill  . albuterol (PROAIR HFA) 108 (90 BASE) MCG/ACT inhaler 2 puffs every 4 hours as needed only  if your can't catch your breath (Patient taking differently: Inhale 1-2 puffs into the lungs every 4 (four) hours as needed for wheezing or shortness of breath. ) 1 Inhaler 11  . budesonide (PULMICORT) 0.25 MG/2ML nebulizer solution Take 2 mLs (0.25 mg total) by nebulization 2 (two) times daily. DX: J44.9 120 mL 11  .  budesonide-formoterol (SYMBICORT) 160-4.5 MCG/ACT inhaler Inhale 2 puffs into the lungs 2 (two) times daily. 1 Inhaler 12  . formoterol (PERFOROMIST) 20 MCG/2ML nebulizer solution Take 2 mLs (20 mcg total) by nebulization 2 (two) times daily. DX: J44.9 120 mL 11  . furosemide (LASIX) 20 MG tablet Take 20 mg by mouth daily.    Marland Kitchen ipratropium-albuterol (DUONEB) 0.5-2.5 (3) MG/3ML SOLN Take 3 mLs by nebulization every 6 (six) hours as needed (wheezing/shortness of breath). 60 mL 11  . meclizine (ANTIVERT) 12.5 MG tablet TAKE 1 TABLET BY MOUTH THREE TIMES DAILY AS NEEDED FOR DIZZINESS 60 tablet 1  . metFORMIN (GLUCOPHAGE) 500 MG tablet TAKE ONE TABLET BY MOUTH TWICE DAILY WITH A MEAL 180 tablet 3  . omeprazole (PRILOSEC) 40 MG capsule Take 1 capsule by mouth every afternoon. (Patient taking differently: Take 40 mg by mouth daily. ) 30 capsule 6  . oxyCODONE (OXY IR/ROXICODONE) 5 MG immediate release tablet Take 1 tablet (5 mg total) by mouth every 4 (four) hours as needed for severe pain. 30 tablet 0  . tamsulosin (FLOMAX) 0.4 MG CAPS capsule TAKE ONE CAPSULE BY MOUTH ONCE DAILY 30 capsule 3   No current facility-administered medications on file prior to visit.    Review of Systems Constitutional: Negative for other unusual diaphoresis, sweats, appetite or weight changes HENT: Negative for other worsening hearing loss, ear pain, facial swelling, mouth sores or neck stiffness.   Eyes: Negative for other worsening pain, redness or other visual disturbance.  Respiratory: Negative for other stridor or swelling Cardiovascular: Negative for other palpitations or other chest pain  Gastrointestinal: Negative for worsening diarrhea or loose stools, blood in stool, distention or other pain Genitourinary: Negative for hematuria, flank pain or other change in urine volume.  Musculoskeletal: Negative for myalgias or other joint swelling.  Skin: Negative for other color change, or other wound or worsening  drainage.  Neurological: Negative for other syncope or numbness. Hematological: Negative for other adenopathy or swelling Psychiatric/Behavioral: Negative for hallucinations, other worsening agitation, SI, self-injury, or new decreased concentration \\All  other system neg per pt    Objective:   Physical Exam BP (!) 144/90   Pulse 97   Ht 6\' 3"  (1.905 m)   Wt 227 lb (103 kg)   SpO2 99%   BMI 28.37 kg/m  VS noted,  Constitutional: Pt is oriented to person, place, and time. Appears well-developed and well-nourished, in no significant distress and comfortable Head: Normocephalic and atraumatic  Eyes: Conjunctivae and EOM are normal. Pupils are equal, round, and reactive to light Right Ear: External ear normal without discharge Left Ear: External ear normal without discharge Nose: Nose without discharge or deformity Mouth/Throat: Oropharynx is without other ulcerations and moist  Neck: Normal range of motion. Neck supple. No JVD present. No tracheal deviation present or significant neck LA  or mass Cardiovascular: Normal rate, regular rhythm, normal heart sounds and intact distal pulses.   Pulmonary/Chest: WOB normal and breath sounds without rales or wheezing  Abdominal: Soft. Bowel sounds are normal. NT. No HSM  Musculoskeletal: Normal range of motion. Exhibits no edema Lymphadenopathy: Has no other cervical adenopathy.  Neurological: Pt is alert and oriented to person, place, and time. Pt has normal reflexes. No cranial nerve deficit. Motor grossly intact, Gait intact Skin: Skin is warm and dry. No rash noted or new ulcerations Psychiatric:  Has normal mood and affect. Behavior is normal without agitation No other exam findings    Assessment & Plan:

## 2017-04-24 NOTE — Patient Instructions (Addendum)
Please remember to call for your yearly eye exam.    You had the steroid shot today  OK to increase the gabapentin to 300 mg at bedtime  Please continue all other medications as before, and refills have been done if requested - the prednisone  Please have the pharmacy call with any other refills you may need.  Please continue your efforts at being more active, low cholesterol diet, and weight control.  You are otherwise up to date with prevention measures today.  Please keep your appointments with your specialists as you may have planned  Please schedule the bone density test before leaving today at the scheduling desk (where you check out)  Please go to the LAB in the Basement (turn left off the elevator) for the tests to be done today  You will be contacted by phone if any changes need to be made immediately.  Otherwise, you will receive a letter about your results with an explanation, but please check with MyChart first.  Please remember to sign up for MyChart if you have not done so, as this will be important to you in the future with finding out test results, communicating by private email, and scheduling acute appointments online when needed.  Please return in 6 months, or sooner if needed, with Lab testing done 3-5 days before

## 2017-04-24 NOTE — Progress Notes (Signed)
Pre visit review using our clinic review tool, if applicable. No additional management support is needed unless otherwise documented below in the visit note. 

## 2017-04-25 NOTE — Assessment & Plan Note (Signed)
For dxa r/o osteoporosis

## 2017-04-25 NOTE — Assessment & Plan Note (Signed)
For refill, also dxa

## 2017-04-25 NOTE — Assessment & Plan Note (Signed)
stable overall by history and exam, recent data reviewed with pt, and pt to continue medical treatment as before,  to f/u any worsening symptoms or concerns Lab Results  Component Value Date   HGBA1C 6.7 (H) 04/25/2016

## 2017-04-25 NOTE — Assessment & Plan Note (Signed)

## 2017-04-25 NOTE — Assessment & Plan Note (Addendum)
Mild to mod, for depomedrol IM 80, cont prednisone, to use inhalers if can afford,  to f/u any worsening symptoms or concerns  In addition to the time spent performing CPE, I spent an additional 25 minutes face to face,in which greater than 50% of this time was spent in counseling and coordination of care for patient's acute illness as documented, including differential diagnosis, evaluation and tx with respect to spine fx, copd exacerbation and copd management, DM management and continued monitoring with home cbg's, and need for dxa scan due to hx of fx and steroid use

## 2017-04-27 LAB — PSA: PSA: 1.03 ng/mL (ref 0.10–4.00)

## 2017-04-28 ENCOUNTER — Encounter: Payer: Self-pay | Admitting: Internal Medicine

## 2017-05-05 ENCOUNTER — Inpatient Hospital Stay: Admission: RE | Admit: 2017-05-05 | Payer: PPO | Source: Ambulatory Visit

## 2017-05-28 ENCOUNTER — Other Ambulatory Visit: Payer: Self-pay | Admitting: *Deleted

## 2017-05-28 DIAGNOSIS — J449 Chronic obstructive pulmonary disease, unspecified: Secondary | ICD-10-CM

## 2017-05-28 MED ORDER — BUDESONIDE 0.25 MG/2ML IN SUSP: 0.2500 mg | Freq: Two times a day (BID) | RESPIRATORY_TRACT | 11 refills | Status: AC

## 2017-06-05 ENCOUNTER — Other Ambulatory Visit: Payer: Self-pay | Admitting: Internal Medicine

## 2017-06-27 ENCOUNTER — Emergency Department (HOSPITAL_COMMUNITY)
Admission: EM | Admit: 2017-06-27 | Discharge: 2017-06-27 | Disposition: A | Discharge status: Home / Self Care | Payer: PPO | Attending: Emergency Medicine | Admitting: Emergency Medicine

## 2017-06-27 ENCOUNTER — Encounter (HOSPITAL_COMMUNITY): Payer: Self-pay

## 2017-06-27 DIAGNOSIS — Z7984 Long term (current) use of oral hypoglycemic drugs: Secondary | ICD-10-CM | POA: Diagnosis not present

## 2017-06-27 DIAGNOSIS — E119 Type 2 diabetes mellitus without complications: Secondary | ICD-10-CM | POA: Insufficient documentation

## 2017-06-27 DIAGNOSIS — Z79899 Other long term (current) drug therapy: Secondary | ICD-10-CM | POA: Diagnosis not present

## 2017-06-27 DIAGNOSIS — I10 Essential (primary) hypertension: Secondary | ICD-10-CM | POA: Diagnosis not present

## 2017-06-27 DIAGNOSIS — I2510 Atherosclerotic heart disease of native coronary artery without angina pectoris: Secondary | ICD-10-CM | POA: Diagnosis not present

## 2017-06-27 DIAGNOSIS — R9431 Abnormal electrocardiogram [ECG] [EKG]: Secondary | ICD-10-CM | POA: Diagnosis not present

## 2017-06-27 DIAGNOSIS — F17210 Nicotine dependence, cigarettes, uncomplicated: Secondary | ICD-10-CM | POA: Diagnosis not present

## 2017-06-27 DIAGNOSIS — K219 Gastro-esophageal reflux disease without esophagitis: Secondary | ICD-10-CM | POA: Diagnosis not present

## 2017-06-27 DIAGNOSIS — K30 Functional dyspepsia: Secondary | ICD-10-CM | POA: Diagnosis present

## 2017-06-27 DIAGNOSIS — J449 Chronic obstructive pulmonary disease, unspecified: Secondary | ICD-10-CM | POA: Diagnosis not present

## 2017-06-27 LAB — HEPATIC FUNCTION PANEL
ALBUMIN: 3.7 g/dL (ref 3.5–5.0)
ALK PHOS: 64 U/L (ref 38–126)
ALT: 14 U/L — AB (ref 17–63)
AST: 22 U/L (ref 15–41)
Bilirubin, Direct: 0.4 mg/dL (ref 0.1–0.5)
Indirect Bilirubin: 0.7 mg/dL (ref 0.3–0.9)
TOTAL PROTEIN: 7.1 g/dL (ref 6.5–8.1)
Total Bilirubin: 1.1 mg/dL (ref 0.3–1.2)

## 2017-06-27 LAB — URINALYSIS, ROUTINE W REFLEX MICROSCOPIC
BILIRUBIN URINE: NEGATIVE
Bacteria, UA: NONE SEEN
GLUCOSE, UA: NEGATIVE mg/dL
HGB URINE DIPSTICK: NEGATIVE
KETONES UR: 5 mg/dL — AB
LEUKOCYTES UA: NEGATIVE
NITRITE: NEGATIVE
PH: 5 (ref 5.0–8.0)
Protein, ur: 30 mg/dL — AB
RBC / HPF: NONE SEEN RBC/hpf (ref 0–5)
SPECIFIC GRAVITY, URINE: 1.027 (ref 1.005–1.030)
WBC, UA: NONE SEEN WBC/hpf (ref 0–5)

## 2017-06-27 LAB — BASIC METABOLIC PANEL
Anion gap: 13 (ref 5–15)
BUN: 12 mg/dL (ref 6–20)
CHLORIDE: 101 mmol/L (ref 101–111)
CO2: 23 mmol/L (ref 22–32)
CREATININE: 1.39 mg/dL — AB (ref 0.61–1.24)
Calcium: 9.2 mg/dL (ref 8.9–10.3)
GFR, EST AFRICAN AMERICAN: 56 mL/min — AB (ref >60)
GFR, EST NON AFRICAN AMERICAN: 49 mL/min — AB (ref >60)
Glucose, Bld: 103 mg/dL — ABNORMAL HIGH (ref 65–99)
Potassium: 3.4 mmol/L — ABNORMAL LOW (ref 3.5–5.1)
SODIUM: 137 mmol/L (ref 135–145)

## 2017-06-27 LAB — CBC
HCT: 47.5 % (ref 39.0–52.0)
Hemoglobin: 15.1 g/dL (ref 13.0–17.0)
MCH: 26.9 pg (ref 26.0–34.0)
MCHC: 31.8 g/dL (ref 30.0–36.0)
MCV: 84.7 fL (ref 78.0–100.0)
PLATELETS: 345 10*3/uL (ref 150–400)
RBC: 5.61 MIL/uL (ref 4.22–5.81)
RDW: 14.8 % (ref 11.5–15.5)
WBC: 12.1 10*3/uL — AB (ref 4.0–10.5)

## 2017-06-27 LAB — I-STAT TROPONIN, ED: TROPONIN I, POC: 0.02 ng/mL (ref 0.00–0.08)

## 2017-06-27 LAB — LIPASE, BLOOD: Lipase: 26 U/L (ref 11–51)

## 2017-06-27 LAB — CBG MONITORING, ED: Glucose-Capillary: 106 mg/dL — ABNORMAL HIGH (ref 65–99)

## 2017-06-27 MED ORDER — METOCLOPRAMIDE HCL 10 MG PO TABS: 10.0000 mg | ORAL_TABLET | Freq: Three times a day (TID) | ORAL | 0 refills | Status: DC | PRN

## 2017-06-27 MED ORDER — METOCLOPRAMIDE HCL 10 MG PO TABS: 10.0000 mg | ORAL_TABLET | Freq: Three times a day (TID) | ORAL | 0 refills | Status: AC | PRN

## 2017-06-27 MED ORDER — SODIUM CHLORIDE 0.9 % IV BOLUS (SEPSIS)
1000.0000 mL | Freq: Once | INTRAVENOUS | Status: AC
  Administered 2017-06-27: 1000 mL via INTRAVENOUS

## 2017-06-27 MED ORDER — METOCLOPRAMIDE HCL 5 MG/ML IJ SOLN
10.0000 mg | Freq: Once | INTRAMUSCULAR | Status: AC
  Administered 2017-06-27: 10 mg via INTRAVENOUS
  Filled 2017-06-27: qty 2

## 2017-06-27 MED ORDER — GI COCKTAIL ~~LOC~~
30.0000 mL | Freq: Once | ORAL | Status: AC
  Administered 2017-06-27: 30 mL via ORAL
  Filled 2017-06-27: qty 30

## 2017-06-27 NOTE — ED Notes (Signed)
Pt aware that urine sample is needed.  

## 2017-06-27 NOTE — ED Provider Notes (Signed)
[facility] DEPT Provider Note   CSN: [id number] Arrival date & time: 06/27/17  1125     History   Chief Complaint Chief Complaint  Patient presents with  . Gastroesophageal Reflux    HPI Richard Hopkins is a 71 y.o. male.  The history is provided by the patient.  Gastroesophageal Reflux  This is a recurrent problem. Episode onset: several years; this episode for 5 days. The problem occurs daily. The problem has not changed since onset.Pertinent negatives include no chest pain, no abdominal pain, no headaches and no shortness of breath. The symptoms are aggravated by eating. Nothing relieves the symptoms. Treatments tried: PPI. The treatment provided no relief.    Past Medical History:  Diagnosis Date  . ABNORMAL ELECTROCARDIOGRAM   . Acute prostatitis   . ADENOMATOUS COLONIC POLYP   . ALLERGIC RHINITIS   . BENIGN PROSTATIC HYPERTROPHY   . BLURRED VISION   . Brain tumor ([place])   . Cardiomyopathy ([place]) 01/12/2016  . CHRONIC OBSTRUCTIVE PULMONARY DISEASE, ACUTE EXACERBATION   . Chronic steroid use   . COPD   . COPD (chronic obstructive pulmonary disease) ([place])   . DIABETES MELLITUS, TYPE II   . DIVERTICULAR DISEASE   . DYSPNEA   . DYSPNEA/SHORTNESS OF BREATH   . EPIDIDYMO-ORCHITIS   . ERECTILE DYSFUNCTION, ORGANIC, HX OF   . ESOPHAGEAL STRICTURE   . FATIGUE   . GERD   . HYPERLIPIDEMIA   . HYPERTENSION   . LUMBAR DISC DISORDER   . MASS, SUPERFICIAL   . MUSCLE CRAMPS   . Nocturia   . Pain in soft tissues of limb   . PSA, INCREASED   . PULMONARY FIBROSIS   . SKIN LESION   . SMOKER   . SYNCOPE   . THRUSH   . Wheezing     Patient Active Problem List   Diagnosis Date Noted  . Closed compression fracture of L1 lumbar vertebra ([place]) 04/24/2017  . Asthmatic bronchitis 01/13/2017  . Bursitis of left hip 01/13/2017  . 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 Diabetes mellitus with complication ([place]) 17/79/3903  . CKD (chronic kidney disease) 08/06/2016    . Cellulitis 08/06/2016  . Hematoma 08/06/2016  . Chronic diastolic (congestive) heart failure ([place]) 08/06/2016  . Cellulitis of left lower leg   . Coagulopathy ([place]) 08/05/2016  . Monocytosis 04/25/2016  . Cardiomyopathy ([place]) 01/12/2016  . 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 Dizziness 01/07/2016  . Blurred vision 01/07/2016  . Temporal lobe lesion 01/07/2016  . BPH (benign prostatic hypertrophy) 01/07/2016  . Abnormal CT scan, esophagus 07/25/2015  . Loss of weight 07/25/2015  . BPV (benign positional vertigo) 06/02/2015  . Cough 05/11/2015  . Type 2 diabetes mellitus ([place]) 04/13/2015  . GERD (gastroesophageal reflux disease) 04/13/2015  . Tobacco abuse 04/13/2015  . SOB (shortness of breath)   . CAP (community acquired pneumonia) 04/11/2015  . COPD exacerbation ([place]) 04/11/2015  . Acute respiratory failure with hypoxia ([place]) 04/11/2015  . Fatigue 04/03/2015  . Unspecified constipation 05/04/2014  . Lumbar radiculopathy 04/27/2014  . Acute blood loss anemia 02/21/2014  . Leukocytosis, unspecified 02/21/2014  . 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 Bruising 02/18/2014  . Left leg pain 02/09/2014  . Rib pain on right side 10/06/2013  . Pulmonary nodules/lesions, multiple 03/04/2013  . 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 Current use of steroid medication 03/01/2013  . Prepatellar bursitis of left knee 09/16/2012  . Thrush 09/16/2012  . Scrotal  rash 05/06/2012  . Skin lesion 05/06/2012  . Thumb pain, left 05/06/2012  . Brain tumor ([place]) 09/21/2011  . LUE weakness 09/17/2011  . Neck pain 08/20/2011  . Encounter for well adult exam with abnormal findings 05/05/2011  . SYNCOPE 01/16/2011  . SMOKER 08/30/2010  . PSA, INCREASED 06/26/2010  . ABNORMAL ELECTROCARDIOGRAM 08/24/2009  . BPH associated with nocturia 10/09/2008  . Nocturia 10/09/2008  . 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 Hyperlipidemia 09/28/2007  . Essential hypertension 09/28/2007  . Allergic  rhinitis 09/28/2007  . COPD GOLD III/IV  09/28/2007  . ESOPHAGEAL STRICTURE 09/28/2007  . GERD 09/28/2007  . Lumbar disc disease 09/28/2007  . FATIGUE 09/28/2007  . 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 DIVERTICULAR DISEASE 08/31/2007    Past Surgical History:  Procedure Laterality Date  . BACK SURGERY     x 4 disabled after 2003  . CARDIAC CATHETERIZATION N/A 03/26/2016   Procedure: Left Heart Cath and Coronary Angiography;  Surgeon: Josue Hector, MD;  Location: [facility];  Service: Cardiovascular;  Laterality: N/A;  . ELBOW ARTHROSCOPY         Home Medications    Prior to Admission medications   Medication Sig Start Date End Date Taking? Authorizing Provider  albuterol (PROAIR HFA) 108 (90 BASE) MCG/ACT inhaler 2 puffs every 4 hours as needed only  if your can't catch your breath Patient taking differently: Inhale 1-2 puffs into the lungs every 4 (four) hours as needed for wheezing or shortness of breath.  09/18/15  Yes Biagio Borg, MD  budesonide-formoterol [facility]) 160-4.5 MCG/ACT inhaler Inhale 2 puffs into the lungs 2 (two) times daily. 10/30/16  Yes Biagio Borg, MD  cloNIDine (CATAPRES) 0.1 MG tablet Take 1 tablet (0.1 mg total) by mouth 2 (two) times daily. Patient taking differently: Take 0.1 mg by mouth daily.  04/24/17  Yes Biagio Borg, MD  furosemide (LASIX) 20 MG tablet Take 20 mg by mouth daily. 10/26/16  Yes [provider]  gabapentin (NEURONTIN) 300 MG capsule Take 1 capsule (300 mg total) by mouth at bedtime. 04/24/17  Yes Biagio Borg, MD  ipratropium-albuterol (DUONEB) 0.5-2.5 (3) MG/3ML SOLN Take 3 mLs by nebulization every 6 (six) hours as needed (wheezing/shortness of breath). 09/18/15  Yes Biagio Borg, MD  meclizine (ANTIVERT) 12.5 MG tablet TAKE 1 TABLET BY MOUTH THREE TIMES DAILY AS NEEDED FOR DIZZINESS Patient taking differently: TAKE 12.5 MG BY MOUTH THREE TIMES DAILY AS NEEDED  FOR DIZZINESS 04/09/17  Yes Biagio Borg, MD  metFORMIN (GLUCOPHAGE) 500 MG tablet TAKE ONE TABLET BY MOUTH TWICE DAILY WITH A MEAL Patient taking differently: TAKE 500 MG BY MOUTH TWICE DAILY WITH A MEAL 01/23/17  Yes Biagio Borg, MD  omeprazole (PRILOSEC) 40 MG capsule Take 1 capsule by mouth every afternoon. Patient taking differently: Take 40 mg by mouth daily.  03/08/15  Yes Lafayette Dragon, MD  predniSONE (DELTASONE) 10 MG tablet 1 tab by mouth every other day Patient taking differently: Take 10 mg by mouth every other day.  04/24/17  Yes Biagio Borg, MD  tamsulosin (FLOMAX) 0.4 MG CAPS capsule TAKE ONE CAPSULE BY MOUTH ONCE DAILY Patient taking differently: TAKE 0.4 MG BY MOUTH ONCE DAILY 06/05/17  Yes Biagio Borg, MD  budesonide (PULMICORT) 0.25 MG/2ML nebulizer solution Take 2 mLs (0.25 mg total) by nebulization 2 (two) times daily. DX: J44.9 Patient not taking: Reported on 06/27/2017 05/28/17  Biagio Borg, MD  formoterol (PERFOROMIST) 20 MCG/2ML nebulizer solution Take 2 mLs (20 mcg total) by nebulization 2 (two) times daily. DX: J44.9 Patient not taking: Reported on 06/27/2017 09/17/16   Biagio Borg, MD  metoCLOPramide (REGLAN) 10 MG tablet Take 1 tablet (10 mg total) by mouth every 8 (eight) hours as needed for nausea. 06/27/17   Sinan Tuch, Grayce Sessions, MD  oxyCODONE (OXY IR/ROXICODONE) 5 MG immediate release tablet Take 1 tablet (5 mg total) by mouth every 4 (four) hours as needed for severe pain. Patient not taking: Reported on 06/27/2017 03/31/17   Janith Lima, MD    Family History Family History  Problem Relation Age of Onset  . Diabetes Mother   . Asthma Mother   . Emphysema Mother        smoker  . Colon cancer Neg Hx     Social History Social History  Substance Use Topics  . Smoking status: Current Every Day Smoker    Packs/day: 1.00    Years: 50.00    Types: Cigarettes  . Smokeless tobacco: Never Used  . Alcohol use No     Allergies   Doxycycline; Ace  inhibitors; Cardura [doxazosin mesylate]; Cozaar [losartan potassium]; Dexilant [dexlansoprazole]; Hydrocodone; Lisinopril; Norvasc [amlodipine besylate]; and Alfuzosin   Review of Systems Review of Systems  Respiratory: Negative for shortness of breath.   Cardiovascular: Negative for chest pain.  Gastrointestinal: Negative for abdominal pain.  Neurological: Negative for headaches.  All other systems are reviewed and are negative for acute change except as noted in the HPI    Physical Exam Updated Vital Signs BP 118/86 (BP Location: Right Arm)   Pulse (!) 111   Temp 98.2 F (36.8 C) (Oral)   Resp 18   SpO2 97%   Physical Exam  Constitutional: He is oriented to person, place, and time. He appears well-developed and well-nourished. No distress.  HENT:  Head: Normocephalic and atraumatic.  Nose: Nose normal.  Eyes: Conjunctivae and EOM are normal. Pupils are equal, round, and reactive to light. Right eye exhibits no discharge. Left eye exhibits no discharge. No scleral icterus.  Neck: Normal range of motion. Neck supple.  Cardiovascular: Normal rate and regular rhythm.  Exam reveals no gallop and no friction rub.   No murmur heard. Pulmonary/Chest: Effort normal and breath sounds normal. No stridor. No respiratory distress. He has no rales.  Abdominal: Soft. He exhibits no distension. There is no tenderness.  Musculoskeletal: He exhibits no edema or tenderness.  Neurological: He is alert and oriented to person, place, and time.  Skin: Skin is warm and dry. No rash noted. He is not diaphoretic. No erythema.  Psychiatric: He has a normal mood and affect.  Vitals reviewed.    ED Treatments / Results  Labs (all labs ordered are listed, but only abnormal results are displayed) Labs Reviewed  BASIC METABOLIC PANEL - Abnormal; Notable for the following:       Result Value   Potassium 3.4 (*)    Glucose, Bld 103 (*)    Creatinine, Ser 1.39 (*)    GFR calc non Af Amer 49 (*)     GFR calc Af Amer 56 (*)    All other components within normal limits  CBC - Abnormal; Notable for the following:    WBC 12.1 (*)    All other components within normal limits  URINALYSIS, ROUTINE W REFLEX MICROSCOPIC - Abnormal; Notable for the following:    Color, Urine AMBER (*)  APPearance HAZY (*)    Ketones, ur 5 (*)    Protein, ur 30 (*)    Squamous Epithelial / LPF 0-5 (*)    All other components within normal limits  HEPATIC FUNCTION PANEL - Abnormal; Notable for the following:    ALT 14 (*)    All other components within normal limits  CBG MONITORING, ED - Abnormal; Notable for the following:    Glucose-Capillary 106 (*)    All other components within normal limits  LIPASE, BLOOD  I-STAT TROPOININ, ED    EKG  EKG Interpretation  Date/Time:  Saturday June 27 2017 11:40:08 EDT Ventricular Rate:  109 PR Interval:  196 QRS Duration: 94 QT Interval:  344 QTC Calculation: 463 R Axis:   33 Text Interpretation:  Sinus tachycardia Possible Left atrial enlargement Left ventricular hypertrophy with repolarization abnormality Abnormal ECG mild diffuse ST depression in inferolateral leads No STEMI  Confirmed by Addison Lank ([id number]) on 06/27/2017 11:53:45 AM       Radiology No results found.  Procedures Procedures (including critical care time)  Medications Ordered in ED Medications  gi cocktail (Maalox,Lidocaine,Donnatal) (30 mLs Oral Given 06/27/17 1425)  metoCLOPramide (REGLAN) injection 10 mg (10 mg Intravenous Given 06/27/17 1425)  sodium chloride 0.9 % bolus 1,000 mL (1,000 mLs Intravenous New Bag/Given 06/27/17 1359)     Initial Impression / Assessment and Plan / ED Course  I have reviewed the triage vital signs and the nursing notes.  Pertinent labs & imaging results that were available during my care of the patient were reviewed by me and considered in my medical decision making (see chart for details).     Severe GERD, and difficulty tolerating by mouth.  Denies any chest pain, abdominal pain. Abdomen benign. Labs grossly reassuring without evidence of pancreatitis, biliary obstruction, renal insufficiency or electrolyte derangements.  Patient provided with IV fluids and Reglan resulting in significant improvement in his symptomatology.  Patient able to tolerate by mouth intake here.  Low suspicion for serious intra-abdominal inflammatory/infectious process. Presentation is not consistent with cardiac etiology.  The patient is safe for discharge with strict return precautions.   Final Clinical Impressions(s) / ED Diagnoses   Final diagnoses:  Gastroesophageal reflux disease, esophagitis presence not specified   Disposition: Discharge  Condition: Good  I have discussed the results, Dx and Tx plan with the patient who expressed understanding and agree(s) with the plan. Discharge instructions discussed at great length. The patient was given strict return precautions who verbalized understanding of the instructions. No further questions at time of discharge.    New Prescriptions   METOCLOPRAMIDE (REGLAN) 10 MG TABLET    Take 1 tablet (10 mg total) by mouth every 8 (eight) hours as needed for nausea.    Follow Up: Biagio Borg, MD [place] [address] [phone]  Schedule an appointment as soon as possible for a visit  in 5-7 days, If symptoms do not improve or  worsen      Richards Pherigo, Grayce Sessions, MD 06/27/17 [phone]

## 2017-06-27 NOTE — ED Notes (Signed)
Pt is reporting he feels SOB, has neck pain on both sides of his neck and feels very weak

## 2017-06-27 NOTE — ED Notes (Signed)
Patient is alert and orientedx4.  Patient was explained discharge instructions and they understood them with no questions.   

## 2017-06-27 NOTE — ED Triage Notes (Signed)
Pt reports he is having a bad case of indigestion/acid relfux. He states he cant keep anything down and when something does come up its mucus. He reports he coughs so much due to the reflux. Denies chest pain.

## 2017-06-27 NOTE — ED Notes (Addendum)
Pt wheeled to room A2 by this RN and care handoff given to [place], [place]

## 2017-06-29 ENCOUNTER — Ambulatory Visit (INDEPENDENT_AMBULATORY_CARE_PROVIDER_SITE_OTHER): Payer: PPO | Admitting: Family Medicine

## 2017-06-29 ENCOUNTER — Other Ambulatory Visit (INDEPENDENT_AMBULATORY_CARE_PROVIDER_SITE_OTHER): Payer: PPO

## 2017-06-29 ENCOUNTER — Encounter: Payer: Self-pay | Admitting: Family Medicine

## 2017-06-29 VITALS — BP 138/100 | HR 118 | Ht 75.0 in | Wt 227.0 lb

## 2017-06-29 DIAGNOSIS — K2100 Gastro-esophageal reflux disease with esophagitis, without bleeding: Secondary | ICD-10-CM

## 2017-06-29 DIAGNOSIS — R918 Other nonspecific abnormal finding of lung field: Secondary | ICD-10-CM

## 2017-06-29 DIAGNOSIS — C719 Malignant neoplasm of brain, unspecified: Secondary | ICD-10-CM

## 2017-06-29 DIAGNOSIS — M2550 Pain in unspecified joint: Secondary | ICD-10-CM

## 2017-06-29 DIAGNOSIS — M353 Polymyalgia rheumatica: Secondary | ICD-10-CM

## 2017-06-29 DIAGNOSIS — K219 Gastro-esophageal reflux disease without esophagitis: Secondary | ICD-10-CM | POA: Diagnosis not present

## 2017-06-29 DIAGNOSIS — D72829 Elevated white blood cell count, unspecified: Secondary | ICD-10-CM

## 2017-06-29 LAB — CBC WITH DIFFERENTIAL/PLATELET
BASOS PCT: 0.5 % (ref 0.0–3.0)
Basophils Absolute: 0.1 10*3/uL (ref 0.0–0.1)
EOS ABS: 1.1 10*3/uL — AB (ref 0.0–0.7)
Eosinophils Relative: 8.3 % — ABNORMAL HIGH (ref 0.0–5.0)
HEMATOCRIT: 44.2 % (ref 39.0–52.0)
Hemoglobin: 14.2 g/dL (ref 13.0–17.0)
LYMPHS ABS: 1.7 10*3/uL (ref 0.7–4.0)
LYMPHS PCT: 13.4 % (ref 12.0–46.0)
MCHC: 32.2 g/dL (ref 30.0–36.0)
MCV: 82.2 fl (ref 78.0–100.0)
MONO ABS: 2 10*3/uL — AB (ref 0.1–1.0)
Monocytes Relative: 15.5 % — ABNORMAL HIGH (ref 3.0–12.0)
NEUTROS ABS: 8 10*3/uL — AB (ref 1.4–7.7)
NEUTROS PCT: 62.3 % (ref 43.0–77.0)
PLATELETS: 364 10*3/uL (ref 150.0–400.0)
RBC: 5.37 Mil/uL (ref 4.22–5.81)
RDW: 15.3 % (ref 11.5–15.5)
WBC: 12.9 10*3/uL — ABNORMAL HIGH (ref 4.0–10.5)

## 2017-06-29 LAB — COMPREHENSIVE METABOLIC PANEL
ALK PHOS: 59 U/L (ref 39–117)
ALT: 11 U/L (ref 0–53)
AST: 14 U/L (ref 0–37)
Albumin: 3.7 g/dL (ref 3.5–5.2)
BUN: 17 mg/dL (ref 6–23)
CO2: 24 mEq/L (ref 19–32)
Calcium: 9.9 mg/dL (ref 8.4–10.5)
Chloride: 97 mEq/L (ref 96–112)
Creatinine, Ser: 1.3 mg/dL (ref 0.40–1.50)
GFR: 69.51 mL/min (ref >60.00)
GLUCOSE: 95 mg/dL (ref 70–99)
POTASSIUM: 3.5 meq/L (ref 3.5–5.1)
Sodium: 134 mEq/L — ABNORMAL LOW (ref 135–145)
TOTAL PROTEIN: 7.2 g/dL (ref 6.0–8.3)
Total Bilirubin: 0.8 mg/dL (ref 0.2–1.2)

## 2017-06-29 LAB — FERRITIN: Ferritin: 386.1 ng/mL — ABNORMAL HIGH (ref 22.0–322.0)

## 2017-06-29 LAB — TSH: TSH: 0.86 u[IU]/mL (ref 0.35–4.50)

## 2017-06-29 LAB — SEDIMENTATION RATE

## 2017-06-29 LAB — VITAMIN D 25 HYDROXY (VIT D DEFICIENCY, FRACTURES): VITD: 29.11 ng/mL — AB (ref 30.00–100.00)

## 2017-06-29 LAB — TROPONIN I: TNIDX: 0.03 ug/L (ref 0.00–0.06)

## 2017-06-29 MED ORDER — CIPROFLOXACIN HCL 500 MG PO TABS: 500.0000 mg | ORAL_TABLET | Freq: Two times a day (BID) | ORAL | 0 refills | Status: DC

## 2017-06-29 MED ORDER — METRONIDAZOLE 500 MG PO TABS: 500.0000 mg | ORAL_TABLET | Freq: Two times a day (BID) | ORAL | 0 refills | Status: DC

## 2017-06-29 NOTE — Progress Notes (Signed)
[facility] [place], [place] [address] Phone: [phone] Subjective:    I'm seeing this patient by the request  of:    CC: left leg pain f/u  XIP:[id number]  Richard Hopkins is a 70 y.o. male coming in with complaint of left leg pain. Patient was seen by me and did have worsening left leg weakness. Patient states that it only is his legs weeks that he is also acutely weak overall. Patient was seen in the emergency department 2 days ago. Patient was evaluated there and was told that it was more secondary to upper guidance. Patient has been given medication for this but has not made any significant improvement. This feels like he cannot walk long distances secondary to the severe amount of pain. Patient states that he sometimes feels like he has chills but has not had any fever. Mild associated abdominal pain but severe back pain.  Patient did have x-rays. X-rays were independently visualized by me. X-rays of patient's lumbar spine did not show any significant progression. No hip arthritis noted as well.   Will patient was in the emergency room and did evaluate patient's labs. This showed that patient was somewhat dehydrated with ketones as well as proteinuria. Creatinine was up at 1.39.  Other imaging.  previous imaging is an MRI back from May 2015. This was independently visualized by me today. Patient has had multiple laminectomies previously. Has left lateral recess narrowing at L4-L5 with severe foraminal narrowing as well. Moderate spinal stenosis at L4 L3.  Past Medical History:  Diagnosis Date  . ABNORMAL ELECTROCARDIOGRAM   . Acute prostatitis   . ADENOMATOUS COLONIC POLYP   . ALLERGIC RHINITIS   . BENIGN PROSTATIC HYPERTROPHY   . BLURRED VISION   . Brain tumor ([place])   . Cardiomyopathy ([place]) 01/12/2016  . CHRONIC OBSTRUCTIVE PULMONARY DISEASE, ACUTE EXACERBATION   . Chronic steroid use   . COPD   . COPD (chronic obstructive pulmonary  disease) ([place])   . DIABETES MELLITUS, TYPE II   . DIVERTICULAR DISEASE   . DYSPNEA   . DYSPNEA/SHORTNESS OF BREATH   . EPIDIDYMO-ORCHITIS   . ERECTILE DYSFUNCTION, ORGANIC, HX OF   . ESOPHAGEAL STRICTURE   . FATIGUE   . GERD   . HYPERLIPIDEMIA   . HYPERTENSION   . LUMBAR DISC DISORDER   . MASS, SUPERFICIAL   . MUSCLE CRAMPS   . Nocturia   . Pain in soft tissues of limb   . PSA, INCREASED   . PULMONARY FIBROSIS   . SKIN LESION   . SMOKER   . SYNCOPE   . THRUSH   . Wheezing    Past Surgical History:  Procedure Laterality Date  . BACK SURGERY     x 4 disabled after 2003  . CARDIAC CATHETERIZATION N/A 03/26/2016   Procedure: Left Heart Cath and Coronary Angiography;  Surgeon: Josue Hector, MD;  Location: [facility];  Service: Cardiovascular;  Laterality: N/A;  . ELBOW ARTHROSCOPY     Social History   Social History  . Marital status: Married    Spouse name: N/A  . Number of children: 5  . Years of education: N/A   Occupational History  . disabled Retired   Social History Main Topics  . Smoking status: Current Every Day Smoker    Packs/day: 1.00    Years: 50.00    Types: Cigarettes  . Smokeless tobacco: Never Used  . Alcohol use  No  . Drug use: No  . Sexual activity: Not Asked   Other Topics Concern  . 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 Doxycycline Nausea Only and Other (See Comments)    Loss of memory; "bad feeling," dizziness  . Ace Inhibitors Cough  . Cardura [Doxazosin Mesylate] Other (See Comments)    dizziness  . Cozaar [Losartan Potassium] Other (See Comments)    dizziness  . Dexilant [Dexlansoprazole] Itching and Rash  . Hydrocodone Nausea Only and Other (See Comments)    dizziness  . Lisinopril Cough  . Norvasc [Amlodipine Besylate] Other (See Comments)    3  times dizzy, bad taste in mouth, none since stopped  . Alfuzosin Rash   Family History  Problem Relation Age of Onset  . Diabetes Mother   . Asthma Mother   . Emphysema Mother        smoker  . Colon cancer Neg Hx     Past medical history, social, surgical and family history all reviewed in electronic medical record.  No pertanent information unless stated regarding to the chief complaint.   Review of Systems: No headache, visual changes, nausea, vomiting, diarrhea, constipation, dizziness, abdominal pain, skin rash, fevers, chills, night sweats, weight loss, swollen lymph nodes, chest pain, shortness of breath, mood changes.  Positive muscle aches, positive body aches,  Objective  Blood pressure (!) 138/100, pulse (!) 118, height 6\' 3"  (1.905 m), weight 227 lb (103 kg).   Systems examined below as of 06/29/17 General: NAD A&O x3 mood, affect normal  HEENT: Pupils equal, extraocular movements intact no nystagmus Respiratory: not short of breath at rest or with speaking Cardiovascular: No lower extremity edema, non tender Skin: Warm dry intact with no signs of infection or rash on extremities or on axial skeleton. Abdomen: Soft Moderately tender with some mild guarding noted of the right lower quadrant Neuro: Cranial nerves  intact, neurovascularly intact in all extremities with 2+ DTRs and 2+ pulses. Lymph: No lymphadenopathy appreciated today  GaitAntalgic gait. MSK: Diffuse tender with full range of motion and good stability and symmetric strength and tone of shoulders, elbows, wrist,  knee hips and ankles bilaterally.  Arthritic changes of multiple joints  Back Exam:  Inspection:Loss of lordosis  Motion: Flexion 25 deg, Extension 15 deg, Side Bending to 25 deg bilaterally,  Rotation to 35 deg bilaterally  SLR laying: Positive still on the left XSLR laying: Negative  Palpable tenderness: Minimal tenderness to palpation of the paraspinal musculature lumbar spine. FABER:  negative. Sensory change: Gross sensation intact to all lumbar and sacral dermatomes.  Reflexes: 2+ at both patellar tendons, 2+ at achilles tendons, Babinski's downgoing.  Strength at foot  Plantar-flexion: 5/5 Dorsi-flexion: 5/5 Eversion: 5/5 Inversion: 5/5  Leg strength  4 out of 5 but symmetric    Impression and Recommendations:     This case required medical decision making of moderate complexity.      Note: This dictation was prepared with Dragon dictation along with smaller phrase technology. Any transcriptional errors that result from this process are unintentional.

## 2017-06-29 NOTE — Assessment & Plan Note (Signed)
Patient will be sent to gastroenterology. Patient has had multiple workup including a CT abdomen previously. CT abdomen did show thickening of the esophagus but patient has not been seen for an EGD at any point. There was a possibility of esophageal cancer at that time. She does have an elevated sedimentation rate and ferritin is somewhat elevated. Possible GI bleed is occurring. Warned the patient about this and possibly continuing the higher doses of the PPI. Patient will monitor for any type of dark stool any bright red blood and would seek medical attention immediately. Patient understands and will continue to monitor.

## 2017-06-29 NOTE — Assessment & Plan Note (Signed)
Patient has had pulmonary nodules previously. Was to have a 12 month follow-up back in 2016. CT of the chest.

## 2017-06-29 NOTE — Assessment & Plan Note (Signed)
Patient is having significant weakness of the proximal muscles. Polymyalgia rheumatica is within the check if there is any type of thyroid as well as any type of troponin difficulty that can be contributing. I do feel that ambulatory workup with gastroenterology with patient having abdominal pain as well as symptoms of gastroesophageal reflux could be beneficial. Patient does have a past medical history significant for smoker and has been seen by pulmonary previously. We discussed with the pulmonary fibrosis this could be a flare. Medications given per orders. We'll keep close follow-up and see patient again in 1 week. Worsening symptoms patient is to seek medical attention immediately.

## 2017-06-29 NOTE — Patient Instructions (Signed)
Good to see you  Lets get labs today  Stomach doc will be calling you on the stomach problems.  Cipro and flagyl 2 times daily for 10 days to help the stomach  Follow up with Dr. Jenny Reichmann in next 1-2 weeks Once a little better we can focus on the back again.

## 2017-06-29 NOTE — Assessment & Plan Note (Signed)
Patient may need repeat imaging. We'll discuss with neurosurgery to see what they think. Await their recommendations.

## 2017-06-29 NOTE — Assessment & Plan Note (Signed)
Continues because her ptosis and monocytosis. We will continue to follow. Elevated from patient's previous level also. May need further evaluation by oncology and hematology. Could be reactive as well from the infectious etiology and will be treated accordingly.

## 2017-06-30 LAB — IRON,TIBC AND FERRITIN PANEL
%SAT: 9 % — ABNORMAL LOW (ref 15–60)
FERRITIN: 492 ng/mL — AB (ref 20–380)
IRON: 24 ug/dL — AB (ref 50–180)
TIBC: 269 ug/dL (ref 250–425)

## 2017-07-01 ENCOUNTER — Telehealth: Payer: Self-pay | Admitting: Family Medicine

## 2017-07-01 ENCOUNTER — Other Ambulatory Visit: Payer: Self-pay | Admitting: *Deleted

## 2017-07-01 DIAGNOSIS — R918 Other nonspecific abnormal finding of lung field: Secondary | ICD-10-CM

## 2017-07-01 NOTE — Telephone Encounter (Signed)
Discussed with pt

## 2017-07-01 NOTE — Telephone Encounter (Signed)
Please call Pt back with results from 7/9

## 2017-07-05 ENCOUNTER — Other Ambulatory Visit: Payer: Self-pay | Admitting: Internal Medicine

## 2017-07-09 ENCOUNTER — Ambulatory Visit (INDEPENDENT_AMBULATORY_CARE_PROVIDER_SITE_OTHER): Payer: PPO | Admitting: Internal Medicine

## 2017-07-09 ENCOUNTER — Encounter: Payer: Self-pay | Admitting: Internal Medicine

## 2017-07-09 VITALS — BP 120/82 | HR 98 | Ht 75.0 in | Wt 204.0 lb

## 2017-07-09 DIAGNOSIS — K219 Gastro-esophageal reflux disease without esophagitis: Secondary | ICD-10-CM | POA: Diagnosis not present

## 2017-07-09 DIAGNOSIS — R700 Elevated erythrocyte sedimentation rate: Secondary | ICD-10-CM

## 2017-07-09 DIAGNOSIS — I10 Essential (primary) hypertension: Secondary | ICD-10-CM

## 2017-07-09 DIAGNOSIS — J441 Chronic obstructive pulmonary disease with (acute) exacerbation: Secondary | ICD-10-CM | POA: Diagnosis not present

## 2017-07-09 MED ORDER — OMEPRAZOLE 40 MG PO CPDR: 40.0000 mg | DELAYED_RELEASE_CAPSULE | Freq: Every day | ORAL | 3 refills | Status: AC

## 2017-07-09 MED ORDER — METHYLPREDNISOLONE ACETATE 80 MG/ML IJ SUSP
80.0000 mg | Freq: Once | INTRAMUSCULAR | Status: AC
  Administered 2017-07-09: 80 mg via INTRAMUSCULAR

## 2017-07-09 MED ORDER — PREDNISONE 10 MG PO TABS: ORAL_TABLET | ORAL | 0 refills | Status: AC

## 2017-07-09 NOTE — Patient Instructions (Addendum)
OK to restart the Omeprazole 40 mg per day!  You had the steroid shot today  Please take all new medication as prescribed - the prednisone  Please continue all other medications as before, and refills have been done if requested.  Please have the pharmacy call with any other refills you may need.  Please keep your appointments with your specialists as you may have planned - GI next month

## 2017-07-09 NOTE — Progress Notes (Signed)
Subjective:    Patient ID: Richard Hopkins, male    DOB: 1946/03/08, 71 y.o.   MRN: [id number]  HPI  Pt here to f/u, states "I've been sick since July 2", seen in ED July 7 for uncontrolled GERD described with burning discomfort, mod, intermittent, without radation and Denies dysphagia, n/v, bowel change or blood. Tx with reglan and overall improved.  Unfortunately though he has listed PPI on the EMR, he has not actually been taking for several weeks.No recent increased ETOH or nsaid use.  incidently today with 3-4 days onset yellowish prod cough with mild wheezing/sob, without fever.  Saw Dr Tamala Julian but has not taken the cipro and flagyl as he was told by pharmacist to take with food and he is not eating well, has low energy.  Sed rate 78 on July , has previosly been normal several times in 8 yrs.  Overall Has felt too bad to get the ct chest so far, just felt "down" to get it done,  Has not called about the ct abd.  He is worried b/c mother had all his symptoms with fatigue and not feeling well before she died soon after. Past Medical History:  Diagnosis Date  . ABNORMAL ELECTROCARDIOGRAM   . Acute prostatitis   . ADENOMATOUS COLONIC POLYP   . ALLERGIC RHINITIS   . BENIGN PROSTATIC HYPERTROPHY   . BLURRED VISION   . Brain tumor ([place])   . Cardiomyopathy ([place]) 01/12/2016  . CHRONIC OBSTRUCTIVE PULMONARY DISEASE, ACUTE EXACERBATION   . Chronic steroid use   . COPD   . COPD (chronic obstructive pulmonary disease) ([place])   . DIABETES MELLITUS, TYPE II   . DIVERTICULAR DISEASE   . DYSPNEA   . DYSPNEA/SHORTNESS OF BREATH   . EPIDIDYMO-ORCHITIS   . ERECTILE DYSFUNCTION, ORGANIC, HX OF   . ESOPHAGEAL STRICTURE   . FATIGUE   . GERD   . HYPERLIPIDEMIA   . HYPERTENSION   . LUMBAR DISC DISORDER   . MASS, SUPERFICIAL   . MUSCLE CRAMPS   . Nocturia   . Pain in soft tissues of limb   . PSA, INCREASED   . PULMONARY FIBROSIS   . SKIN LESION   . SMOKER   . SYNCOPE   . THRUSH   . Wheezing      Past Surgical History:  Procedure Laterality Date  . BACK SURGERY     x 4 disabled after 2003  . CARDIAC CATHETERIZATION N/A 03/26/2016   Procedure: Left Heart Cath and Coronary Angiography;  Surgeon: Josue Hector, MD;  Location: [facility];  Service: Cardiovascular;  Laterality: N/A;  . ELBOW ARTHROSCOPY      reports that he has been smoking Cigarettes.  He has a 50.00 pack-year smoking history. He has never used smokeless tobacco. He reports that he does not drink alcohol or use drugs. family history includes Asthma in his mother; Diabetes in his mother; Emphysema in his mother. Allergies  Allergen Reactions  . Doxycycline Nausea Only and Other (See Comments)    Loss of memory; "bad feeling," dizziness  . Ace Inhibitors Cough  . Cardura [Doxazosin Mesylate] Other (See Comments)    dizziness  . Cozaar [Losartan Potassium] Other (See Comments)    dizziness  . Dexilant [Dexlansoprazole] Itching and Rash  . Hydrocodone Nausea Only and Other (See Comments)    dizziness  . Lisinopril Cough  . Norvasc [Amlodipine Besylate] Other (See Comments)    3 times dizzy, bad taste in mouth,  none since stopped  . Alfuzosin Rash   Current Outpatient Prescriptions on File Prior to Visit  Medication Sig Dispense Refill  . albuterol (PROAIR HFA) 108 (90 BASE) MCG/ACT inhaler 2 puffs every 4 hours as needed only  if your can't catch your breath (Patient taking differently: Inhale 1-2 puffs into the lungs every 4 (four) hours as needed for wheezing or shortness of breath. ) 1 Inhaler 11  . budesonide (PULMICORT) 0.25 MG/2ML nebulizer solution Take 2 mLs (0.25 mg total) by nebulization 2 (two) times daily. DX: J44.9 120 mL 11  . budesonide-formoterol (SYMBICORT) 160-4.5 MCG/ACT inhaler Inhale 2 puffs into the lungs 2 (two) times daily. 1 Inhaler 12  . cloNIDine (CATAPRES) 0.1 MG tablet Take 1 tablet (0.1 mg total) by mouth 2 (two) times daily. (Patient taking differently: Take 0.1 mg by mouth  daily. ) 180 tablet 3  . formoterol (PERFOROMIST) 20 MCG/2ML nebulizer solution Take 2 mLs (20 mcg total) by nebulization 2 (two) times daily. DX: J44.9 120 mL 11  . furosemide (LASIX) 20 MG tablet Take 20 mg by mouth daily.    Marland Kitchen gabapentin (NEURONTIN) 300 MG capsule Take 1 capsule (300 mg total) by mouth at bedtime. 90 capsule 1  . ipratropium-albuterol (DUONEB) 0.5-2.5 (3) MG/3ML SOLN Take 3 mLs by nebulization every 6 (six) hours as needed (wheezing/shortness of breath). 60 mL 11  . meclizine (ANTIVERT) 12.5 MG tablet TAKE 1 TABLET BY MOUTH THREE TIMES DAILY AS NEEDED FOR DIZZINESS 60 tablet 1  . metFORMIN (GLUCOPHAGE) 500 MG tablet TAKE ONE TABLET BY MOUTH TWICE DAILY WITH A MEAL (Patient taking differently: TAKE 500 MG BY MOUTH TWICE DAILY WITH A MEAL) 180 tablet 3  . metoCLOPramide (REGLAN) 10 MG tablet Take 1 tablet (10 mg total) by mouth every 8 (eight) hours as needed for nausea. 30 tablet 0  . oxyCODONE (OXY IR/ROXICODONE) 5 MG immediate release tablet Take 1 tablet (5 mg total) by mouth every 4 (four) hours as needed for severe pain. 30 tablet 0  . predniSONE (DELTASONE) 10 MG tablet 1 tab by mouth every other day (Patient taking differently: Take 10 mg by mouth every other day. ) 45 tablet 1  . tamsulosin (FLOMAX) 0.4 MG CAPS capsule TAKE ONE CAPSULE BY MOUTH ONCE DAILY (Patient taking differently: TAKE 0.4 MG BY MOUTH ONCE DAILY) 30 capsule 3   No current facility-administered medications on file prior to visit.    Review of Systems  Constitutional: Negative for other unusual diaphoresis or sweats HENT: Negative for ear discharge or swelling Eyes: Negative for other worsening visual disturbances Respiratory: Negative for stridor or other swelling  Gastrointestinal: Negative for worsening distension or other blood Genitourinary: Negative for retention or other urinary change Musculoskeletal: Negative for other MSK pain or swelling Skin: Negative for color change or other new  lesions Neurological: Negative for worsening tremors and other numbness  Psychiatric/Behavioral: Negative for worsening agitation or other fatigue All other system neg per pt    Objective:   Physical Exam BP 120/82   Pulse 98   Ht 6\' 3"  (1.905 m)   Wt 204 lb (92.5 kg)   SpO2 98%   BMI 25.50 kg/m  VS noted,  Constitutional: Pt appears in NAD HENT: Head: NCAT.  Right Ear: External ear normal.  Left Ear: External ear normal.  Eyes: . Pupils are equal, round, and reactive to light. Conjunctivae and EOM are normal Nose: without d/c or deformity Neck: Neck supple. Gross normal ROM Cardiovascular: Normal rate and  regular rhythm.   Pulmonary/Chest: Effort normal and breath sounds without rales or wheezing.  Abd:  Soft, NT, ND, + BS, no organomegaly Neurological: Pt is alert. At baseline orientation, motor grossly intact Skin: Skin is warm. No rashes, other new lesions, no LE edema Psychiatric: Pt behavior is normal without agitation  No other exam findings Lab Results  Component Value Date   WBC 12.9 (H) 06/29/2017   HGB 14.2 06/29/2017   HCT 44.2 06/29/2017   PLT 364.0 06/29/2017   GLUCOSE 95 06/29/2017   CHOL 214 (H) 04/24/2017   TRIG 126.0 04/24/2017   HDL 86.70 04/24/2017   LDLDIRECT 116.0 01/16/2011   LDLCALC 102 (H) 04/24/2017   ALT 11 06/29/2017   AST 14 06/29/2017   NA 134 (L) 06/29/2017   K 3.5 06/29/2017   CL 97 06/29/2017   CREATININE 1.30 06/29/2017   BUN 17 06/29/2017   CO2 24 06/29/2017   TSH 0.86 06/29/2017   PSA 1.03 04/24/2017   INR 0.91 03/24/2016   HGBA1C 6.7 (H) 04/25/2016   MICROALBUR 0.7 01/07/2016       Assessment & Plan:

## 2017-07-12 NOTE — Assessment & Plan Note (Signed)
Etiology unclear, encouraged pt to follow through with recommended testing

## 2017-07-12 NOTE — Assessment & Plan Note (Signed)
Mild to mod, for depomedrol IM 80, predpac asd, to f/u any worsening symptoms or concerns 

## 2017-07-12 NOTE — Assessment & Plan Note (Signed)
stable overall by history and exam, recent data reviewed with pt, and pt to continue medical treatment as before,  to f/u any worsening symptoms or concerns BP Readings from Last 3 Encounters:  07/09/17 120/82  06/29/17 (!) 138/100  06/27/17 140/66

## 2017-07-12 NOTE — Assessment & Plan Note (Signed)
Ok to restart PPI (omeprazole 40), ok to wean off the reglan after start in a few days

## 2017-07-21 ENCOUNTER — Emergency Department (HOSPITAL_COMMUNITY): Payer: PPO

## 2017-07-21 ENCOUNTER — Encounter (HOSPITAL_COMMUNITY): Payer: Self-pay | Admitting: Emergency Medicine

## 2017-07-21 ENCOUNTER — Inpatient Hospital Stay (HOSPITAL_COMMUNITY)
Admission: EM | Admit: 2017-07-21 | Discharge: 2017-08-22 | DRG: 296 | Disposition: E | Payer: PPO | Attending: Emergency Medicine | Admitting: Emergency Medicine

## 2017-07-21 DIAGNOSIS — S01112A Laceration without foreign body of left eyelid and periocular area, initial encounter: Secondary | ICD-10-CM | POA: Diagnosis present

## 2017-07-21 DIAGNOSIS — E872 Acidosis: Secondary | ICD-10-CM | POA: Diagnosis present

## 2017-07-21 DIAGNOSIS — R6521 Severe sepsis with septic shock: Secondary | ICD-10-CM | POA: Diagnosis present

## 2017-07-21 DIAGNOSIS — S0990XA Unspecified injury of head, initial encounter: Secondary | ICD-10-CM | POA: Diagnosis not present

## 2017-07-21 DIAGNOSIS — J9601 Acute respiratory failure with hypoxia: Secondary | ICD-10-CM | POA: Diagnosis not present

## 2017-07-21 DIAGNOSIS — R0902 Hypoxemia: Secondary | ICD-10-CM | POA: Diagnosis not present

## 2017-07-21 DIAGNOSIS — S2243XA Multiple fractures of ribs, bilateral, initial encounter for closed fracture: Secondary | ICD-10-CM | POA: Diagnosis not present

## 2017-07-21 DIAGNOSIS — Z79899 Other long term (current) drug therapy: Secondary | ICD-10-CM

## 2017-07-21 DIAGNOSIS — I255 Ischemic cardiomyopathy: Secondary | ICD-10-CM | POA: Diagnosis not present

## 2017-07-21 DIAGNOSIS — E785 Hyperlipidemia, unspecified: Secondary | ICD-10-CM | POA: Diagnosis not present

## 2017-07-21 DIAGNOSIS — R570 Cardiogenic shock: Secondary | ICD-10-CM | POA: Diagnosis present

## 2017-07-21 DIAGNOSIS — J449 Chronic obstructive pulmonary disease, unspecified: Secondary | ICD-10-CM | POA: Diagnosis present

## 2017-07-21 DIAGNOSIS — I429 Cardiomyopathy, unspecified: Secondary | ICD-10-CM | POA: Diagnosis not present

## 2017-07-21 DIAGNOSIS — Z515 Encounter for palliative care: Secondary | ICD-10-CM | POA: Diagnosis not present

## 2017-07-21 DIAGNOSIS — R4020 Unspecified coma: Secondary | ICD-10-CM | POA: Diagnosis not present

## 2017-07-21 DIAGNOSIS — K222 Esophageal obstruction: Secondary | ICD-10-CM | POA: Diagnosis not present

## 2017-07-21 DIAGNOSIS — Z9911 Dependence on respirator [ventilator] status: Secondary | ICD-10-CM | POA: Diagnosis not present

## 2017-07-21 DIAGNOSIS — G9340 Encephalopathy, unspecified: Secondary | ICD-10-CM | POA: Diagnosis not present

## 2017-07-21 DIAGNOSIS — J9690 Respiratory failure, unspecified, unspecified whether with hypoxia or hypercapnia: Secondary | ICD-10-CM | POA: Diagnosis not present

## 2017-07-21 DIAGNOSIS — J189 Pneumonia, unspecified organism: Secondary | ICD-10-CM | POA: Diagnosis present

## 2017-07-21 DIAGNOSIS — D66 Hereditary factor VIII deficiency: Secondary | ICD-10-CM | POA: Diagnosis present

## 2017-07-21 DIAGNOSIS — J9811 Atelectasis: Secondary | ICD-10-CM | POA: Diagnosis not present

## 2017-07-21 DIAGNOSIS — I110 Hypertensive heart disease with heart failure: Secondary | ICD-10-CM | POA: Diagnosis present

## 2017-07-21 DIAGNOSIS — F17210 Nicotine dependence, cigarettes, uncomplicated: Secondary | ICD-10-CM | POA: Diagnosis not present

## 2017-07-21 DIAGNOSIS — Z66 Do not resuscitate: Secondary | ICD-10-CM | POA: Diagnosis not present

## 2017-07-21 DIAGNOSIS — E119 Type 2 diabetes mellitus without complications: Secondary | ICD-10-CM | POA: Diagnosis present

## 2017-07-21 DIAGNOSIS — G931 Anoxic brain damage, not elsewhere classified: Secondary | ICD-10-CM | POA: Diagnosis present

## 2017-07-21 DIAGNOSIS — M479 Spondylosis, unspecified: Secondary | ICD-10-CM | POA: Diagnosis present

## 2017-07-21 DIAGNOSIS — Z881 Allergy status to other antibiotic agents status: Secondary | ICD-10-CM

## 2017-07-21 DIAGNOSIS — W19XXXA Unspecified fall, initial encounter: Secondary | ICD-10-CM | POA: Diagnosis present

## 2017-07-21 DIAGNOSIS — K209 Esophagitis, unspecified: Secondary | ICD-10-CM | POA: Diagnosis not present

## 2017-07-21 DIAGNOSIS — J9600 Acute respiratory failure, unspecified whether with hypoxia or hypercapnia: Secondary | ICD-10-CM

## 2017-07-21 DIAGNOSIS — Y95 Nosocomial condition: Secondary | ICD-10-CM | POA: Diagnosis present

## 2017-07-21 DIAGNOSIS — R918 Other nonspecific abnormal finding of lung field: Secondary | ICD-10-CM | POA: Diagnosis not present

## 2017-07-21 DIAGNOSIS — E669 Obesity, unspecified: Secondary | ICD-10-CM | POA: Diagnosis present

## 2017-07-21 DIAGNOSIS — I5032 Chronic diastolic (congestive) heart failure: Secondary | ICD-10-CM | POA: Diagnosis present

## 2017-07-21 DIAGNOSIS — S0181XA Laceration without foreign body of other part of head, initial encounter: Secondary | ICD-10-CM | POA: Diagnosis not present

## 2017-07-21 DIAGNOSIS — Z833 Family history of diabetes mellitus: Secondary | ICD-10-CM

## 2017-07-21 DIAGNOSIS — G40901 Epilepsy, unspecified, not intractable, with status epilepticus: Secondary | ICD-10-CM

## 2017-07-21 DIAGNOSIS — A419 Sepsis, unspecified organism: Secondary | ICD-10-CM | POA: Diagnosis not present

## 2017-07-21 DIAGNOSIS — Z7951 Long term (current) use of inhaled steroids: Secondary | ICD-10-CM

## 2017-07-21 DIAGNOSIS — Z6828 Body mass index (BMI) 28.0-28.9, adult: Secondary | ICD-10-CM

## 2017-07-21 DIAGNOSIS — Z885 Allergy status to narcotic agent status: Secondary | ICD-10-CM

## 2017-07-21 DIAGNOSIS — K219 Gastro-esophageal reflux disease without esophagitis: Secondary | ICD-10-CM | POA: Diagnosis not present

## 2017-07-21 DIAGNOSIS — I469 Cardiac arrest, cause unspecified: Secondary | ICD-10-CM | POA: Diagnosis not present

## 2017-07-21 DIAGNOSIS — S069X6D Unspecified intracranial injury with loss of consciousness greater than 24 hours without return to pre-existing conscious level with patient surviving, subsequent encounter: Secondary | ICD-10-CM | POA: Diagnosis not present

## 2017-07-21 DIAGNOSIS — Z7952 Long term (current) use of systemic steroids: Secondary | ICD-10-CM

## 2017-07-21 DIAGNOSIS — N400 Enlarged prostate without lower urinary tract symptoms: Secondary | ICD-10-CM | POA: Diagnosis present

## 2017-07-21 DIAGNOSIS — R55 Syncope and collapse: Secondary | ICD-10-CM | POA: Diagnosis not present

## 2017-07-21 DIAGNOSIS — R600 Localized edema: Secondary | ICD-10-CM | POA: Diagnosis present

## 2017-07-21 DIAGNOSIS — J8410 Pulmonary fibrosis, unspecified: Secondary | ICD-10-CM | POA: Diagnosis present

## 2017-07-21 DIAGNOSIS — C712 Malignant neoplasm of temporal lobe: Secondary | ICD-10-CM | POA: Diagnosis not present

## 2017-07-21 DIAGNOSIS — N170 Acute kidney failure with tubular necrosis: Secondary | ICD-10-CM | POA: Diagnosis not present

## 2017-07-21 DIAGNOSIS — Z7984 Long term (current) use of oral hypoglycemic drugs: Secondary | ICD-10-CM

## 2017-07-21 DIAGNOSIS — Z452 Encounter for adjustment and management of vascular access device: Secondary | ICD-10-CM | POA: Diagnosis not present

## 2017-07-21 DIAGNOSIS — Z825 Family history of asthma and other chronic lower respiratory diseases: Secondary | ICD-10-CM

## 2017-07-21 DIAGNOSIS — Z888 Allergy status to other drugs, medicaments and biological substances status: Secondary | ICD-10-CM

## 2017-07-21 DIAGNOSIS — R9431 Abnormal electrocardiogram [ECG] [EKG]: Secondary | ICD-10-CM | POA: Diagnosis not present

## 2017-07-21 LAB — GLUCOSE, CAPILLARY: GLUCOSE-CAPILLARY: 154 mg/dL — AB (ref 65–99)

## 2017-07-21 LAB — CBC WITH DIFFERENTIAL/PLATELET
BASOS ABS: 0 10*3/uL (ref 0.0–0.1)
Basophils Relative: 0 %
Eosinophils Absolute: 0.4 10*3/uL (ref 0.0–0.7)
Eosinophils Relative: 2 %
HEMATOCRIT: 43.7 % (ref 39.0–52.0)
HEMOGLOBIN: 12.9 g/dL — AB (ref 13.0–17.0)
LYMPHS PCT: 24 %
Lymphs Abs: 5 10*3/uL — ABNORMAL HIGH (ref 0.7–4.0)
MCH: 26.6 pg (ref 26.0–34.0)
MCHC: 29.5 g/dL — ABNORMAL LOW (ref 30.0–36.0)
MCV: 90.1 fL (ref 78.0–100.0)
MONOS PCT: 9 %
Monocytes Absolute: 1.9 10*3/uL — ABNORMAL HIGH (ref 0.1–1.0)
Neutro Abs: 13.5 10*3/uL — ABNORMAL HIGH (ref 1.7–7.7)
Neutrophils Relative %: 65 %
Platelets: 243 10*3/uL (ref 150–400)
RBC: 4.85 MIL/uL (ref 4.22–5.81)
RDW: 16.5 % — ABNORMAL HIGH (ref 11.5–15.5)
WBC: 20.8 10*3/uL — AB (ref 4.0–10.5)

## 2017-07-21 LAB — I-STAT ARTERIAL BLOOD GAS, ED
ACID-BASE DEFICIT: 9 mmol/L — AB (ref 0.0–2.0)
Bicarbonate: 19.4 mmol/L — ABNORMAL LOW (ref 20.0–28.0)
O2 Saturation: 100 %
TCO2: 21 mmol/L (ref 0–100)
pCO2 arterial: 50.3 mmHg — ABNORMAL HIGH (ref 32.0–48.0)
pH, Arterial: 7.193 — CL (ref 7.350–7.450)
pO2, Arterial: 434 mmHg — ABNORMAL HIGH (ref 83.0–108.0)

## 2017-07-21 LAB — I-STAT CG4 LACTIC ACID, ED: LACTIC ACID, VENOUS: 13.43 mmol/L — AB (ref 0.5–1.9)

## 2017-07-21 LAB — PROTIME-INR
INR: 1.28
PROTHROMBIN TIME: 16 s — AB (ref 11.4–15.2)

## 2017-07-21 LAB — URINALYSIS, ROUTINE W REFLEX MICROSCOPIC
BACTERIA UA: NONE SEEN
Bilirubin Urine: NEGATIVE
Glucose, UA: 50 mg/dL — AB
KETONES UR: NEGATIVE mg/dL
LEUKOCYTES UA: NEGATIVE
Nitrite: NEGATIVE
PH: 6 (ref 5.0–8.0)
Protein, ur: 30 mg/dL — AB
SPECIFIC GRAVITY, URINE: 1.019 (ref 1.005–1.030)

## 2017-07-21 LAB — LIPASE, BLOOD: LIPASE: 69 U/L — AB (ref 11–51)

## 2017-07-21 LAB — COMPREHENSIVE METABOLIC PANEL
ALBUMIN: 2.8 g/dL — AB (ref 3.5–5.0)
ALT: 30 U/L (ref 17–63)
ANION GAP: 19 — AB (ref 5–15)
AST: 47 U/L — ABNORMAL HIGH (ref 15–41)
Alkaline Phosphatase: 52 U/L (ref 38–126)
BILIRUBIN TOTAL: 0.6 mg/dL (ref 0.3–1.2)
BUN: 16 mg/dL (ref 6–20)
CO2: 15 mmol/L — ABNORMAL LOW (ref 22–32)
Calcium: 8.1 mg/dL — ABNORMAL LOW (ref 8.9–10.3)
Chloride: 105 mmol/L (ref 101–111)
Creatinine, Ser: 2 mg/dL — ABNORMAL HIGH (ref 0.61–1.24)
GFR calc Af Amer: 36 mL/min — ABNORMAL LOW (ref >60)
GFR, EST NON AFRICAN AMERICAN: 31 mL/min — AB (ref >60)
Glucose, Bld: 242 mg/dL — ABNORMAL HIGH (ref 65–99)
POTASSIUM: 4.4 mmol/L (ref 3.5–5.1)
Sodium: 139 mmol/L (ref 135–145)
TOTAL PROTEIN: 5.1 g/dL — AB (ref 6.5–8.1)

## 2017-07-21 LAB — I-STAT TROPONIN, ED: Troponin i, poc: 0 ng/mL (ref 0.00–0.08)

## 2017-07-21 MED ORDER — IPRATROPIUM-ALBUTEROL 0.5-2.5 (3) MG/3ML IN SOLN
3.0000 mL | Freq: Four times a day (QID) | RESPIRATORY_TRACT | Status: DC
  Administered 2017-07-22 – 2017-07-24 (×11): 3 mL via RESPIRATORY_TRACT
  Filled 2017-07-21 (×11): qty 3

## 2017-07-21 MED ORDER — PIPERACILLIN-TAZOBACTAM 3.375 G IVPB
3.3750 g | Freq: Three times a day (TID) | INTRAVENOUS | Status: DC
  Administered 2017-07-22 – 2017-07-24 (×8): 3.375 g via INTRAVENOUS
  Filled 2017-07-21 (×11): qty 50

## 2017-07-21 MED ORDER — MIDAZOLAM BOLUS VIA INFUSION
20.0000 mg | Freq: Once | INTRAVENOUS | Status: AC
  Administered 2017-07-21: 20 mg via INTRAVENOUS
  Filled 2017-07-21: qty 20

## 2017-07-21 MED ORDER — ALBUTEROL SULFATE (2.5 MG/3ML) 0.083% IN NEBU: 2.5000 mg | INHALATION_SOLUTION | RESPIRATORY_TRACT | Status: DC | PRN

## 2017-07-21 MED ORDER — VALPROATE SODIUM 500 MG/5ML IV SOLN
500.0000 mg | Freq: Three times a day (TID) | INTRAVENOUS | Status: DC
  Administered 2017-07-22 – 2017-07-24 (×8): 500 mg via INTRAVENOUS
  Filled 2017-07-21 (×10): qty 5

## 2017-07-21 MED ORDER — LORAZEPAM 2 MG/ML IJ SOLN
2.0000 mg | Freq: Once | INTRAMUSCULAR | Status: AC
  Administered 2017-07-21: 2 mg via INTRAVENOUS

## 2017-07-21 MED ORDER — SODIUM CHLORIDE 0.9 % IV SOLN
1000.0000 mg | INTRAVENOUS | Status: AC
  Administered 2017-07-21: 1000 mg via INTRAVENOUS
  Filled 2017-07-21: qty 10

## 2017-07-21 MED ORDER — HEPARIN SODIUM (PORCINE) 5000 UNIT/ML IJ SOLN
5000.0000 [IU] | Freq: Three times a day (TID) | INTRAMUSCULAR | Status: DC
  Administered 2017-07-21 – 2017-07-24 (×9): 5000 [IU] via SUBCUTANEOUS
  Filled 2017-07-21 (×9): qty 1

## 2017-07-21 MED ORDER — NOREPINEPHRINE BITARTRATE 1 MG/ML IV SOLN
0.0000 ug/min | INTRAVENOUS | Status: DC
  Administered 2017-07-21: 5 ug/min via INTRAVENOUS
  Filled 2017-07-21: qty 4

## 2017-07-21 MED ORDER — SODIUM CHLORIDE 0.9 % IV SOLN
0.5000 mg/h | INTRAVENOUS | Status: DC
  Administered 2017-07-21 – 2017-07-22 (×2): 2 mg/h via INTRAVENOUS
  Filled 2017-07-21 (×2): qty 10

## 2017-07-21 MED ORDER — FAMOTIDINE IN NACL 20-0.9 MG/50ML-% IV SOLN
20.0000 mg | Freq: Two times a day (BID) | INTRAVENOUS | Status: DC
  Administered 2017-07-21 – 2017-07-24 (×6): 20 mg via INTRAVENOUS
  Filled 2017-07-21 (×6): qty 50

## 2017-07-21 MED ORDER — ASPIRIN 300 MG RE SUPP
300.0000 mg | RECTAL | Status: AC
  Administered 2017-07-21: 300 mg via RECTAL
  Filled 2017-07-21: qty 1

## 2017-07-21 MED ORDER — FENTANYL CITRATE (PF) 100 MCG/2ML IJ SOLN
50.0000 ug | INTRAMUSCULAR | Status: DC | PRN
  Administered 2017-07-22: 50 ug via INTRAVENOUS

## 2017-07-21 MED ORDER — SODIUM CHLORIDE 0.9 % IV BOLUS (SEPSIS)
500.0000 mL | Freq: Once | INTRAVENOUS | Status: AC
Start: 2017-07-21 — End: 2017-07-21
  Administered 2017-07-21: 500 mL via INTRAVENOUS

## 2017-07-21 MED ORDER — LORAZEPAM 2 MG/ML IJ SOLN
INTRAMUSCULAR | Status: AC
  Administered 2017-07-21: 2 mg
  Filled 2017-07-21: qty 1

## 2017-07-21 MED ORDER — TETANUS-DIPHTH-ACELL PERTUSSIS 5-2.5-18.5 LF-MCG/0.5 IM SUSP: 0.5000 mL | Freq: Once | INTRAMUSCULAR | Status: DC

## 2017-07-21 MED ORDER — SODIUM CHLORIDE 0.9 % IV SOLN: 500.0000 mg | Freq: Two times a day (BID) | INTRAVENOUS | Status: DC

## 2017-07-21 MED ORDER — IOPAMIDOL (ISOVUE-300) INJECTION 61%
INTRAVENOUS | Status: AC
  Administered 2017-07-21: 100 mL
  Filled 2017-07-21: qty 100

## 2017-07-21 MED ORDER — LORAZEPAM 2 MG/ML IJ SOLN
INTRAMUSCULAR | Status: AC
  Filled 2017-07-21: qty 1

## 2017-07-21 MED ORDER — PIPERACILLIN-TAZOBACTAM 3.375 G IVPB 30 MIN
3.3750 g | Freq: Once | INTRAVENOUS | Status: DC
  Filled 2017-07-21 (×2): qty 50

## 2017-07-21 MED ORDER — SODIUM CHLORIDE 0.9 % IV SOLN
INTRAVENOUS | Status: DC
  Administered 2017-07-21 – 2017-07-24 (×3): via INTRAVENOUS

## 2017-07-21 MED ORDER — INSULIN ASPART 100 UNIT/ML ~~LOC~~ SOLN
2.0000 [IU] | SUBCUTANEOUS | Status: DC
  Administered 2017-07-21: 4 [IU] via SUBCUTANEOUS
  Administered 2017-07-23 – 2017-07-24 (×4): 2 [IU] via SUBCUTANEOUS

## 2017-07-21 MED ORDER — SODIUM CHLORIDE 0.9 % IV SOLN
750.0000 mg | Freq: Two times a day (BID) | INTRAVENOUS | Status: DC
  Administered 2017-07-21 – 2017-07-22 (×2): 750 mg via INTRAVENOUS
  Filled 2017-07-21 (×3): qty 7.5

## 2017-07-21 MED ORDER — VANCOMYCIN HCL 10 G IV SOLR
1500.0000 mg | Freq: Once | INTRAVENOUS | Status: AC
  Administered 2017-07-21: 1500 mg via INTRAVENOUS
  Filled 2017-07-21: qty 1500

## 2017-07-21 MED ORDER — VANCOMYCIN HCL 10 G IV SOLR
1250.0000 mg | INTRAVENOUS | Status: DC
  Administered 2017-07-22: 1250 mg via INTRAVENOUS
  Filled 2017-07-21: qty 1250

## 2017-07-21 MED ORDER — VALPROATE SODIUM 500 MG/5ML IV SOLN
30.0000 mg/kg | INTRAVENOUS | Status: AC
  Administered 2017-07-21: 2775 mg via INTRAVENOUS
  Filled 2017-07-21: qty 27.75

## 2017-07-21 MED ORDER — FENTANYL CITRATE (PF) 100 MCG/2ML IJ SOLN
50.0000 ug | INTRAMUSCULAR | Status: DC | PRN
  Filled 2017-07-21: qty 2

## 2017-07-21 NOTE — Progress Notes (Signed)
Pt was brought in by EMS already intubated placed on our vent, transported to CT scan, ABG obtained , now being transported to  [place] report called to Elonda Husky RRT.

## 2017-07-21 NOTE — Consult Note (Addendum)
Neurology Consultation Reason for Consult: Status Epilepticus Referring Physician: Thomasene Lot, C  CC: Seizures  History is obtained from:Chart  HPI: Richard Hopkins is a 71 y.o. male with a history of low grade left temporal glioma who was in his normal state of health until earlier tonight. His family heard a "thump" and patient was found on the ground. EMS was called and on arrival they found him pulseless and apneic. He was in asystole and CPR was started and he was given epinephrine, obtaining ROSC after 9 minutes.  In the emergency department, he began having recurrent seizures. On my arrival, he was in the CT scanner and from the skin of birth I was able to see clonic activity which appeared generalized from my vantage point. He had already received 4 mg IV Ativan and 1 g IV Keppra. I gave a loading dose of approximately 0.2 mg/kg of Versed (20 mg) with cessation of apparent seizure activity.  His glioma is being followed by Dr. Trenton Gammon.(At least that is who ordered the last MRI.)  ROS: Unable to obtain due to altered mental status.   Past Medical History:  Diagnosis Date  . ABNORMAL ELECTROCARDIOGRAM   . Acute prostatitis   . ADENOMATOUS COLONIC POLYP   . ALLERGIC RHINITIS   . BENIGN PROSTATIC HYPERTROPHY   . BLURRED VISION   . Brain tumor ([place])   . Cardiomyopathy ([place]) 01/12/2016  . CHRONIC OBSTRUCTIVE PULMONARY DISEASE, ACUTE EXACERBATION   . Chronic steroid use   . COPD   . COPD (chronic obstructive pulmonary disease) ([place])   . DIABETES MELLITUS, TYPE II   . DIVERTICULAR DISEASE   . DYSPNEA   . DYSPNEA/SHORTNESS OF BREATH   . EPIDIDYMO-ORCHITIS   . ERECTILE DYSFUNCTION, ORGANIC, HX OF   . ESOPHAGEAL STRICTURE   . FATIGUE   . GERD   . HYPERLIPIDEMIA   . HYPERTENSION   . LUMBAR DISC DISORDER   . MASS, SUPERFICIAL   . MUSCLE CRAMPS   . Nocturia   . Pain in soft tissues of limb   . PSA, INCREASED   . PULMONARY FIBROSIS   . SKIN LESION   . SMOKER   . SYNCOPE   .  THRUSH   . Wheezing      Family History  Problem Relation Age of Onset  . Diabetes Mother   . Asthma Mother   . Emphysema Mother        smoker  . Colon cancer Neg Hx      Social History:  reports that he has been smoking Cigarettes.  He has a 50.00 pack-year smoking history. He has never used smokeless tobacco. He reports that he does not drink alcohol or use drugs.   Exam: Current vital signs: BP (!) 200/97   Pulse (!) 116   Resp (!) 22   SpO2 100%  Vital signs in last 24 hours: Pulse Rate:  [76-116] 116 (07/31 2045) Resp:  [22] 22 (07/31 2045) BP: (170-200)/(95-97) 200/97 (07/31 2045) SpO2:  [100 %] 100 % (07/31 2045)   Physical Exam  Constitutional: Appears elderly Psych: Affect appropriate to situation Eyes: No scleral injection, laceration above his left eye HENT: ET tube in place Head: Normocephalic.  Cardiovascular: Tachycardic Respiratory: Ventilated GI: Soft.  No distension. There is no tenderness.  Skin: WDI  Neuro: Mental Status: Patient does not open eyes or follow commands Cranial Nerves: II: He does not blink to threat pupils are nearly equal, both are reactive III,IV, VI: No movement with doll's  eye maneuver V: VII: Corneals are intact bilaterally X: Cough is intact Motor: He has no response to noxious stimuli in any extremity Sensory: As above  Cerebellar: Does not perform  I have reviewed labs in epic and the results pertinent to this consultation are: CMP-elevated creatinine, elevated glucose  I have reviewed the images obtained: CT head-no clear intracranial hemorrhage  Impression: 71 year old male with loss of consciousness associated with apnea and asystole. It is unclear how long he was asystolic. It is also unclear what the driving factor is here. 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   Recommendations: 1) Depacon 30 mg/kg load 1 then 500 mg 3 times a day 2) Keppra 750 mg twice a day (renally dosed) 3) stat EEG  This patient is critically ill and at significant risk of neurological worsening, death and care requires constant monitoring of vital signs, hemodynamics,respiratory and cardiac monitoring, neurological assessment, discussion with family, other specialists and medical decision making of high complexity. I spent 50 minutes of neurocritical care time  in the care of  this patient.  Roland Rack, MD [facility] [phone]  If 7pm- 7am, please page neurology on call as listed in [place]. 07/05/2017  10:58 PM

## 2017-07-21 NOTE — Progress Notes (Signed)
Pharmacy Antibiotic Note  Richard Hopkins is a 71 y.o. male s/p cardiac arrest to begin vancomycin and zosyn for possible aspiration pneumonitis. Acute renal failure noted with Cr 2 and CrCl 39 ml/min.   Vancomycin trough goal 15-20  Plan: 1) Vancomycin 1500mg  IV x 1 then 1250mg  IV q24 2) Zosyn 3.375g IV q8 (4 hour infusion) 3) Follow renal function, cultures, LOT, level if needed  Height: 6\' 3"  (190.5 cm) IBW/kg (Calculated) : 84.5  No data recorded.   Recent Labs Lab 07/03/2017 2040 07/10/2017 2047  WBC 20.8*  --   CREATININE 2.00*  --   LATICACIDVEN  --  13.43*    Estimated Creatinine Clearance: 39.3 mL/min (A) (by C-G formula based on SCr of 2 mg/dL (H)).    Allergies  Allergen Reactions  . Doxycycline Nausea Only and Other (See Comments)    Loss of memory; "bad feeling," dizziness  . Ace Inhibitors Cough  . Cardura [Doxazosin Mesylate] Other (See Comments)    dizziness  . Cozaar [Losartan Potassium] Other (See Comments)    dizziness  . Dexilant [Dexlansoprazole] Itching and Rash  . Hydrocodone Nausea Only and Other (See Comments)    dizziness  . Lisinopril Cough  . Norvasc [Amlodipine Besylate] Other (See Comments)    3 times dizzy, bad taste in mouth, none since stopped  . Alfuzosin Rash    Antimicrobials this admission: 7/31 Vancomycin >> 7/31 Zosyn >>  Dose adjustments this admission: n/a  Microbiology results: 7/31 blood cx >> 7/31 resp cx >>  Thank you for allowing pharmacy to be a part of this patient's care.  Deboraha Sprang 06/27/2017 10:21 PM

## 2017-07-21 NOTE — ED Notes (Signed)
Patient transported to CT scan . 

## 2017-07-21 NOTE — ED Notes (Signed)
Patient transported to CT 

## 2017-07-21 NOTE — ED Notes (Signed)
Ice packs applied at both axilla and groin area , Levophed drip/versed drip and Depakote IV infusing , NS bolus infusing , IV sites intact .

## 2017-07-21 NOTE — ED Triage Notes (Addendum)
Patient is from home, was in the bathroom per family and fell.  Patient was found by family unresponsive in the bathroom.  EMS was called, patient was found pulseless and apneic by [facility] in asystole.  CPR started, patient found in PEA, then after IV established ROSC obtained 9 mins after CPR started and one epi in sinus tach.  Laceration of left eyelid.

## 2017-07-21 NOTE — ED Notes (Signed)
Patient currently at CT scan , Versed drip and Kepra IV infusing , Dr. Katherine Roan ( neurologist) arrived and ordered Versed 20 mg bolus for pt.'s seizures.

## 2017-07-21 NOTE — H&P (Signed)
PULMONARY / CRITICAL CARE MEDICINE   Name: Richard Hopkins MRN: [id number] DOB: 1946-02-24    ADMISSION DATE:  06/21/2017 CONSULTATION DATE:  07/17/2017  REFERRING MD:  Dr. 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 Patient presented to ED intubated, while in ED had seizure activity. CT Head negative. Neurology consulted. PCCM asked to admit.   Family reports that last few weeks patient has been complaining of increased shortness of breath and cough with yellow sputum production.   PAST MEDICAL HISTORY :  He  has a past medical history of ABNORMAL ELECTROCARDIOGRAM; Acute prostatitis; ADENOMATOUS COLONIC POLYP; ALLERGIC RHINITIS; BENIGN PROSTATIC HYPERTROPHY; BLURRED VISION; Brain tumor ([place]); Cardiomyopathy ([place]) (01/12/2016); CHRONIC OBSTRUCTIVE PULMONARY DISEASE, ACUTE EXACERBATION; Chronic steroid use; COPD; COPD (chronic obstructive pulmonary disease) ([place]); DIABETES MELLITUS, TYPE II; DIVERTICULAR DISEASE; DYSPNEA; DYSPNEA/SHORTNESS OF BREATH; EPIDIDYMO-ORCHITIS; ERECTILE DYSFUNCTION, ORGANIC, HX OF; ESOPHAGEAL STRICTURE; FATIGUE; GERD; HYPERLIPIDEMIA; HYPERTENSION; LUMBAR DISC DISORDER; MASS, SUPERFICIAL; MUSCLE CRAMPS; Nocturia; Pain in soft tissues of limb; PSA, INCREASED; PULMONARY FIBROSIS; SKIN LESION; SMOKER; SYNCOPE; THRUSH; and Wheezing.  PAST SURGICAL HISTORY: He  has a past surgical history that includes Back surgery; Elbow arthroscopy; and Cardiac catheterization  (N/A, 03/26/2016).  Allergies  Allergen Reactions  . Doxycycline Nausea Only and Other (See Comments)    Loss of memory; "bad feeling," dizziness  . Ace Inhibitors Cough  . Cardura [Doxazosin Mesylate] Other (See Comments)    dizziness  . Cozaar [Losartan Potassium] Other (See Comments)    dizziness  . Dexilant [Dexlansoprazole] Itching and Rash  . Hydrocodone Nausea Only and Other (See Comments)    dizziness  . Lisinopril Cough  . Norvasc [Amlodipine Besylate] Other (See Comments)    3 times dizzy, bad taste in mouth, none since stopped  . Alfuzosin Rash    No current facility-administered medications on file prior to encounter.    Current Outpatient Prescriptions on File Prior to Encounter  Medication Sig  . albuterol (PROAIR HFA) 108 (90 BASE) MCG/ACT inhaler 2 puffs every 4 hours as needed only  if your can't catch your breath (Patient taking differently: Inhale 1-2 puffs into the lungs every 4 (four) hours as needed for wheezing or shortness of breath. )  . budesonide (PULMICORT) 0.25 MG/2ML nebulizer solution Take 2 mLs (0.25 mg total) by nebulization 2 (two) times daily. DX: J44.9  . budesonide-formoterol (SYMBICORT) 160-4.5 MCG/ACT inhaler Inhale 2 puffs into the lungs 2 (two) times daily.  . cloNIDine (CATAPRES) 0.1 MG tablet Take 1 tablet (0.1 mg total) by mouth 2 (two) times daily. (Patient taking differently: Take 0.1 mg by mouth daily. )  . formoterol (PERFOROMIST) 20 MCG/2ML nebulizer solution Take 2 mLs (20 mcg total) by nebulization 2 (two) times daily. DX: J44.9  . furosemide (LASIX) 20 MG tablet Take 20 mg by mouth daily.  Marland Kitchen gabapentin (NEURONTIN) 300 MG capsule Take 1 capsule (300 mg total) by mouth at bedtime.  Marland Kitchen ipratropium-albuterol (DUONEB) 0.5-2.5 (3) MG/3ML SOLN Take 3  mLs by nebulization every 6 (six) hours as needed (wheezing/shortness of breath).  . meclizine (ANTIVERT) 12.5 MG tablet TAKE 1 TABLET BY MOUTH THREE TIMES DAILY AS NEEDED FOR DIZZINESS  .  metFORMIN (GLUCOPHAGE) 500 MG tablet TAKE ONE TABLET BY MOUTH TWICE DAILY WITH A MEAL (Patient taking differently: TAKE 500 MG BY MOUTH TWICE DAILY WITH A MEAL)  . metoCLOPramide (REGLAN) 10 MG tablet Take 1 tablet (10 mg total) by mouth every 8 (eight) hours as needed for nausea.  Marland Kitchen omeprazole (PRILOSEC) 40 MG capsule Take 1 capsule (40 mg total) by mouth daily.  Marland Kitchen oxyCODONE (OXY IR/ROXICODONE) 5 MG immediate release tablet Take 1 tablet (5 mg total) by mouth every 4 (four) hours as needed for severe pain.  . predniSONE (DELTASONE) 10 MG tablet 1 tab by mouth every other day (Patient taking differently: Take 10 mg by mouth every other day. )  . predniSONE (DELTASONE) 10 MG tablet 4tab by mouth x3day,3tabs x 3day,2tab x 3 days,1 tab x 3 days  . tamsulosin (FLOMAX) 0.4 MG CAPS capsule TAKE ONE CAPSULE BY MOUTH ONCE DAILY (Patient taking differently: TAKE 0.4 MG BY MOUTH ONCE DAILY)    FAMILY HISTORY:  His indicated that his mother is deceased. He indicated that his father is deceased. He indicated that the status of his neg hx is unknown.    SOCIAL HISTORY: He  reports that he has been smoking Cigarettes.  He has a 50.00 pack-year smoking history. He has never used smokeless tobacco. He reports that he does not drink alcohol or use drugs.  REVIEW OF SYSTEMS:   Unable to review as patient in intubated and sedated   SUBJECTIVE:  Intubated and sedated   VITAL SIGNS: BP (!) 200/97   Pulse (!) 116   Resp (!) 22   SpO2 100%   HEMODYNAMICS:    VENTILATOR SETTINGS:    INTAKE / OUTPUT: No intake/output data recorded.  PHYSICAL EXAMINATION: General:  Adult male, no distress, intubated and sedated  Neuro:  Sedated, sluggish pupillary response, does not follow commands, does not moves extremities   HEENT:  ETT in place  Cardiovascular:  RRR, no MRG Lungs:  Diminished breath sounds, no wheeze Abdomen:  Distended, active bowel sounds Musculoskeletal:  -edema  Skin:  Dry, upper eye  laceration    LABS:  BMET No results for input(s): NA, K, CL, CO2, BUN, CREATININE, GLUCOSE in the last 168 hours.  Electrolytes No results for input(s): CALCIUM, MG, PHOS in the last 168 hours.  CBC No results for input(s): WBC, HGB, HCT, PLT in the last 168 hours.  Coag's No results for input(s): APTT, INR in the last 168 hours.  Sepsis Markers  Recent Labs Lab 07/04/2017 2047  LATICACIDVEN 13.43*    ABG No results for input(s): PHART, PCO2ART, PO2ART in the last 168 hours.  Liver Enzymes No results for input(s): AST, ALT, ALKPHOS, BILITOT, ALBUMIN in the last 168 hours.  Cardiac Enzymes No results for input(s): TROPONINI, PROBNP in the last 168 hours.  Glucose No results for input(s): GLUCAP in the last 168 hours.  Imaging Dg Chest Portable 1 View  Result Date: 06/27/2017 CLINICAL DATA:  71 year old male with cardiac arrest. Evaluate for endotracheal and enteric tube placement. EXAM: PORTABLE CHEST 1 VIEW COMPARISON:  Radiograph dated 01/07/2016 FINDINGS: An endotracheal tube is noted with tip approximately 4 cm above the carina. An enteric tube extends below the diaphragm. The side port of the enteric tube is above the diaphragm likely in the  distal esophagus. The tip of the tube is in the proximal stomach. Recommend advancing the tube further into the stomach. There is mild eventration of the left hemidiaphragm. No focal consolidation, pleural effusion, or pneumothorax. The cardiac silhouette is within normal limits. There is osteopenia. No acute osseous pathology. There is gaseous distention of the stomach. IMPRESSION: 1. Endotracheal tube above the carina. Enteric tube side port is in the distal esophagus and tip in the proximal stomach. Recommend advancing the tube further into the stomach. 2. No acute cardiopulmonary process. Electronically Signed   By: Anner Crete M.D.   On: 07/05/2017 21:06     STUDIES:  CXR 7/31 > Endotracheal tube above the carina. Enteric  tube side port is in the distal esophagus and tip in the proximal stomach. Recommend advancing the tube further into the stomach. No acute cardiopulmonary process CT Head 7/31 > no acute CT Chest/A/P 7/31 >>   CULTURES: Blood 7/31 > Sputum 7/31 > UA 7/31 >>   ANTIBIOTICS: Vancomycin 7/31 >> Zosyn 7/31 >>   SIGNIFICANT EVENTS: 7/31 > ED post-cardiac arrest   LINES/TUBES: ETT 7/31 >> OG 7/31 >>   DISCUSSION: 71 year old male presents to ED post-cardiac arrest. 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 With unknown etiology  -Recent reported yellow-sputum production, cxr clear  Leukocytosis  P:   Trend WBC and Fever curve  Follow culture data  Vancomycin and Zosyn   ENDOCRINE A:   DM   P:   Trend Glucose  SSI Hold home metformin   NEUROLOGIC A:   Status Epilepticus  -given 1 gram keppra in ED, 4 mg ativan, started versed gtt, and depacon  Metabolic vs Anoxic  Encephalopathy  H/O Left Temporal lobe mass  H/O Lumbar vertebral fracture, degenerative disc disease s/p multiple laminectomies  P:   RASS goal: -4/-5  Neurology consulted  Fentanyl PRN  Versed gtt for seizure control  Keppra 500 mg BID  EEG pending   FAMILY  - Updates: no family at bedside   - Inter-disciplinary family meet or Palliative Care meeting due by:  07/28/2017    CC Time: 1 minutes   Hayden Pedro, AGACNP-BC [facility]  Pgr: [phone]  PCCM Pgr: [phone]  .Marland KitchenSTAFF NOTE: I, Dr Seward Carol have personally reviewed patient's available data, including medical history, events of note,  physical examination and test results as part of my evaluation. I have discussed with resident/NP and other care providers such as pharmacist, RN and RRT.  In addition,  I personally evaluated patient and elicited key findings of   71 yr old male with PMHx of BPH, GERD (h/o esophageal stricture s/p dilation during EGD, 7 benign polyps seen on colonoscopy), HTN, HLD (cholesterol 214, LDL 102), Syncope, abnormal VWF testing( VWF Ag 293 VWF Act 284), Vit D deficient, ANA+, NIDDM last Hgb A1c 6.7, pulmonary nodules, reduced EF on last ECHO s/p cardiac cath with only 20% disease seen on left marginal, and Low grade glioma presents on 07/01/2017. Per report from [facility], Ed physician pt fell in bathroom found unresponsive by family there was a 20 min downtime until EMS arrived who found pt in PEA arrest pt received 1 Epi with compressions ROSC achieved in 9 mins. On presentation to Er noted to have a laceration over left eyelid and began having grand mal seizures--> status epilepticus patient was bolused witgh Keppra, Versed Ativan and started on Versed ggt. Initial ABG 7.193/50.3/434/21 and LA 13.43. Pt in critical condition. CTH negative for any acute hemorrhage.  Assessmenty and Plan NEURO:  sedated on versed ggt. Analgesia with fentanyl ggt Initially did not have a  pupillary response now regained some reactivity. No intracranial bleed seen on CTH. Preexisting temporal lobe lesion which can be a focus for seizures but most likely etiology is anoxia. Discussed with Neurology. F/u MRI brain Continue Antiepileptic therapy per Neuro recs Normothermia protocol  CARDIAC Last ECHO showed refuced EF Pt has a cardiac cath which showed no sig disease, he had no stenting and EF 55% MAP goal >65 Started on Levophed ggt LA: 13 indicative of hypoperfusion, secondary to post arrest vs post ictal  Repeat LA in 6 hrs Repeat ECHO Troponin  PULM Reviewed CT Chest personally Some subpleural honeycombing noted R>L Traction bronchiectasis ? Pulmonary Fibrosis ? Syncopal episode in bathroom -> hypoxia-> arrest Pt has h/o Factor VIII inhibitor with severe bruising Moderate suspicion for PE Has AKI not an ideal candidate for contrast study Lower ext dopplers   RENAL AGMA Most likely Etiology lactic acidosis Pt has h/o NIDDM on metformin hold metformin inpatient Start on insulin sliding scale Blood glucose 242  check beta OH butyrate Baseline Creatinine 1.39 Creatinine today 2.0  Acute Kidney Injury- possible etiology hypoperfusion ?ATN Will monitor UOP  ID: leukocytosis No fevers  H/o productive cough in the past  No evidence of any opacity on CXR Blood cx sent Broad spectrum antibiotics  HEME: 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 Profound leg swelling with pain and redness DVT studies were negative H/o referral to Heme back in December 2017 S/p 4 cycles of Rituximab leg improved Last cycle in August 2018 ?Hypercoagulable with possible syncope-> PEA arrest AKI no nephrotoxic agents F/u Lower ext dopplers  ENDOCRINE: NIDDM on metformin at home BS 242, BG checks with ISS AGMA Check BetaOHbutyrate-r/o DKA  GI: H.o esophageal stricture that was dilated EGD negative Negative Pathology on polyps  Continue PPI  CODE STATUS:  Full PROPHYLAXIS: Heparin [place] and PPI CONDITION: Critical PROGNOSIS: Guarded  FAMILY: discussed condition with patient's family and need for CVC Consent being obtained.   Marland Kitchen  Rest per NP/medical resident whose note is outlined above and that I agree with  The patient is critically ill with multiple organ systems failure and requires high complexity decision making for assessment and support, frequent evaluation and titration of therapies,  application of advanced monitoring technologies and extensive interpretation of multiple databases.   Critical Care Time devoted to patient care services described in this note is  >30  Minutes. This time reflects time of care of this signee Dr Seward Carol. This critical care time does not reflect procedure time, or teaching time or supervisory time of PA/NP/Med student/Med Resident etc but could involve care discussion time    Dr. Seward Carol Pulmonary Critical Care Medicine 07/08/2017 10:32 PM

## 2017-07-22 ENCOUNTER — Inpatient Hospital Stay (HOSPITAL_COMMUNITY): Payer: PPO

## 2017-07-22 DIAGNOSIS — Z9911 Dependence on respirator [ventilator] status: Secondary | ICD-10-CM

## 2017-07-22 DIAGNOSIS — I469 Cardiac arrest, cause unspecified: Principal | ICD-10-CM

## 2017-07-22 DIAGNOSIS — I255 Ischemic cardiomyopathy: Secondary | ICD-10-CM

## 2017-07-22 LAB — BETA-HYDROXYBUTYRIC ACID: Beta-Hydroxybutyric Acid: 0.13 mmol/L (ref 0.05–0.27)

## 2017-07-22 LAB — BASIC METABOLIC PANEL
ANION GAP: 13 (ref 5–15)
ANION GAP: 9 (ref 5–15)
BUN: 17 mg/dL (ref 6–20)
BUN: 18 mg/dL (ref 6–20)
CALCIUM: 7.4 mg/dL — AB (ref 8.9–10.3)
CHLORIDE: 109 mmol/L (ref 101–111)
CHLORIDE: 112 mmol/L — AB (ref 101–111)
CO2: 18 mmol/L — AB (ref 22–32)
CO2: 20 mmol/L — AB (ref 22–32)
Calcium: 7.6 mg/dL — ABNORMAL LOW (ref 8.9–10.3)
Creatinine, Ser: 1.62 mg/dL — ABNORMAL HIGH (ref 0.61–1.24)
Creatinine, Ser: 1.39 mg/dL — ABNORMAL HIGH (ref 0.61–1.24)
GFR calc Af Amer: 47 mL/min — ABNORMAL LOW (ref >60)
GFR calc non Af Amer: 40 mL/min — ABNORMAL LOW (ref >60)
GFR calc non Af Amer: 49 mL/min — ABNORMAL LOW (ref >60)
GFR, EST AFRICAN AMERICAN: 56 mL/min — AB (ref >60)
GLUCOSE: 94 mg/dL (ref 65–99)
Glucose, Bld: 291 mg/dL — ABNORMAL HIGH (ref 65–99)
POTASSIUM: 4.1 mmol/L (ref 3.5–5.1)
POTASSIUM: 3.5 mmol/L (ref 3.5–5.1)
Sodium: 140 mmol/L (ref 135–145)
Sodium: 141 mmol/L (ref 135–145)

## 2017-07-22 LAB — PROTIME-INR
INR: 1.17
Prothrombin Time: 15 seconds (ref 11.4–15.2)

## 2017-07-22 LAB — CBC
HCT: 38.8 % — ABNORMAL LOW (ref 39.0–52.0)
HEMOGLOBIN: 12.1 g/dL — AB (ref 13.0–17.0)
MCH: 26.5 pg (ref 26.0–34.0)
MCHC: 31.2 g/dL (ref 30.0–36.0)
MCV: 85.1 fL (ref 78.0–100.0)
Platelets: 170 10*3/uL (ref 150–400)
RBC: 4.56 MIL/uL (ref 4.22–5.81)
RDW: 16.7 % — ABNORMAL HIGH (ref 11.5–15.5)
WBC: 11.8 10*3/uL — AB (ref 4.0–10.5)

## 2017-07-22 LAB — GLUCOSE, CAPILLARY
GLUCOSE-CAPILLARY: 85 mg/dL (ref 65–99)
GLUCOSE-CAPILLARY: 92 mg/dL (ref 65–99)
GLUCOSE-CAPILLARY: 97 mg/dL (ref 65–99)
Glucose-Capillary: 103 mg/dL — ABNORMAL HIGH (ref 65–99)
Glucose-Capillary: 70 mg/dL (ref 65–99)
Glucose-Capillary: 82 mg/dL (ref 65–99)
Glucose-Capillary: 85 mg/dL (ref 65–99)

## 2017-07-22 LAB — RAPID URINE DRUG SCREEN, HOSP PERFORMED
Amphetamines: NOT DETECTED
Barbiturates: NOT DETECTED
Benzodiazepines: POSITIVE — AB
COCAINE: NOT DETECTED
OPIATES: NOT DETECTED
TETRAHYDROCANNABINOL: NOT DETECTED

## 2017-07-22 LAB — TRIGLYCERIDES: TRIGLYCERIDES: 194 mg/dL — AB (ref <150)

## 2017-07-22 LAB — TYPE AND SCREEN
ABO/RH(D): A POS
Antibody Screen: NEGATIVE

## 2017-07-22 LAB — ECHOCARDIOGRAM COMPLETE: Height: 75 in

## 2017-07-22 LAB — LACTIC ACID, PLASMA: Lactic Acid, Venous: 5.7 mmol/L (ref 0.5–1.9)

## 2017-07-22 LAB — MAGNESIUM: Magnesium: 1.5 mg/dL — ABNORMAL LOW (ref 1.7–2.4)

## 2017-07-22 LAB — ABO/RH: ABO/RH(D): A POS

## 2017-07-22 LAB — PROCALCITONIN: PROCALCITONIN: 0.22 ng/mL

## 2017-07-22 LAB — APTT: APTT: 30 s (ref 24–36)

## 2017-07-22 LAB — PHOSPHORUS: PHOSPHORUS: 3.7 mg/dL (ref 2.5–4.6)

## 2017-07-22 LAB — MRSA PCR SCREENING: MRSA by PCR: NEGATIVE

## 2017-07-22 MED ORDER — LORAZEPAM 2 MG/ML IJ SOLN
2.0000 mg | Freq: Once | INTRAMUSCULAR | Status: AC
  Administered 2017-07-22: 2 mg via INTRAVENOUS
  Filled 2017-07-22: qty 1

## 2017-07-22 MED ORDER — DEXTROSE 50 % IV SOLN
INTRAVENOUS | Status: AC
  Administered 2017-07-22: 25 mL
  Filled 2017-07-22: qty 50

## 2017-07-22 MED ORDER — SODIUM CHLORIDE 0.9 % IV SOLN: INTRAVENOUS | Status: DC | PRN

## 2017-07-22 MED ORDER — ORAL CARE MOUTH RINSE
15.0000 mL | OROMUCOSAL | Status: DC
  Administered 2017-07-22 – 2017-07-24 (×24): 15 mL via OROMUCOSAL

## 2017-07-22 MED ORDER — SODIUM CHLORIDE 0.9 % IV SOLN
1000.0000 mg | Freq: Once | INTRAVENOUS | Status: AC
  Administered 2017-07-22: 1000 mg via INTRAVENOUS
  Filled 2017-07-22: qty 10

## 2017-07-22 MED ORDER — MIDAZOLAM BOLUS VIA INFUSION
10.0000 mg | Freq: Once | INTRAVENOUS | Status: AC
  Administered 2017-07-22: 10 mg via INTRAVENOUS

## 2017-07-22 MED ORDER — MAGNESIUM SULFATE 2 GM/50ML IV SOLN
2.0000 g | Freq: Once | INTRAVENOUS | Status: AC
  Administered 2017-07-22: 2 g via INTRAVENOUS
  Filled 2017-07-22: qty 50

## 2017-07-22 MED ORDER — CHLORHEXIDINE GLUCONATE CLOTH 2 % EX PADS: 6.0000 | MEDICATED_PAD | Freq: Every day | CUTANEOUS | Status: DC

## 2017-07-22 MED ORDER — SODIUM CHLORIDE 0.9 % IV SOLN
1500.0000 mg | Freq: Two times a day (BID) | INTRAVENOUS | Status: DC
  Administered 2017-07-23 – 2017-07-24 (×4): 1500 mg via INTRAVENOUS
  Filled 2017-07-22 (×5): qty 15

## 2017-07-22 MED ORDER — CHLORHEXIDINE GLUCONATE 0.12% ORAL RINSE (MEDLINE KIT)
15.0000 mL | Freq: Two times a day (BID) | OROMUCOSAL | Status: DC
  Administered 2017-07-22 – 2017-07-24 (×5): 15 mL via OROMUCOSAL

## 2017-07-22 MED ORDER — HYDRALAZINE HCL 20 MG/ML IJ SOLN
20.0000 mg | Freq: Once | INTRAMUSCULAR | Status: AC
  Administered 2017-07-22: 20 mg via INTRAVENOUS
  Filled 2017-07-22: qty 1

## 2017-07-22 MED ORDER — NICARDIPINE HCL IN NACL 20-0.86 MG/200ML-% IV SOLN
3.0000 mg/h | INTRAVENOUS | Status: DC
  Administered 2017-07-22 (×2): 5 mg/h via INTRAVENOUS
  Administered 2017-07-22: 2.5 mg/h via INTRAVENOUS
  Administered 2017-07-23: 10 mg/h via INTRAVENOUS
  Administered 2017-07-23 (×3): 5 mg/h via INTRAVENOUS
  Administered 2017-07-23: 12 mg/h via INTRAVENOUS
  Administered 2017-07-23: 10 mg/h via INTRAVENOUS
  Filled 2017-07-22 (×9): qty 200

## 2017-07-22 MED ORDER — SODIUM CHLORIDE 0.9% FLUSH: 10.0000 mL | INTRAVENOUS | Status: DC | PRN

## 2017-07-22 MED ORDER — PROPOFOL 1000 MG/100ML IV EMUL
5.0000 ug/kg/min | INTRAVENOUS | Status: DC
  Administered 2017-07-22: 70 ug/kg/min via INTRAVENOUS
  Administered 2017-07-22: 10 ug/kg/min via INTRAVENOUS
  Administered 2017-07-22 (×7): 70 ug/kg/min via INTRAVENOUS
  Administered 2017-07-23: 30 ug/kg/min via INTRAVENOUS
  Administered 2017-07-23 (×6): 70 ug/kg/min via INTRAVENOUS
  Administered 2017-07-24: 65 ug/kg/min via INTRAVENOUS
  Administered 2017-07-24 (×2): 40 ug/kg/min via INTRAVENOUS
  Administered 2017-07-24 (×2): 65 ug/kg/min via INTRAVENOUS
  Administered 2017-07-24: 40 ug/kg/min via INTRAVENOUS
  Filled 2017-07-22 (×22): qty 100

## 2017-07-22 MED ORDER — CHLORHEXIDINE GLUCONATE CLOTH 2 % EX PADS
6.0000 | MEDICATED_PAD | Freq: Every day | CUTANEOUS | Status: DC
  Administered 2017-07-22 – 2017-07-24 (×3): 6 via TOPICAL

## 2017-07-22 NOTE — Procedures (Signed)
Arterial Catheter Insertion Procedure Note SIMPSON PAULOS [id number] [birth year]  Procedure: Insertion of Arterial Catheter  Indications: Blood pressure monitoring  Procedure Details Consent: Unable to obtain consent because of emergent medical necessity. Time Out: Verified patient identification, verified procedure, site/side was marked, verified correct patient position, special equipment/implants available, medications/allergies/relevent history reviewed, required imaging and test results available.  Performed  Maximum sterile technique was used including antiseptics, cap, gloves, gown, hand hygiene, mask and sheet. Skin prep: Chlorhexidine; local anesthetic administered 20 gauge catheter was inserted into left radial artery using the Seldinger technique.  Evaluation Blood flow good; BP tracing good. Complications: No apparent complications. RT placed arrow catheter on first attempt.   Dimple Nanas 07/22/2017

## 2017-07-22 NOTE — Progress Notes (Addendum)
Attached vital signs noted after repeated BP cuff adjustment (BP 226/207 MAP 214). Dr. Lamonte Sakai paged and made aware, orders for cardene gtt received. Repeat BP normotensive after several repeats. Holding cardene at this time, BP cuff cycling Q65min. Will continue to monitor closely.

## 2017-07-22 NOTE — Progress Notes (Signed)
Lab unable to draw AM labs, attempted x5, including MD approved foot stick without success. Per lab, unable to attempt again. Per NP [place], ok to insert PICC. Will ask PICC team to assess if PICC vs midline appropriate.

## 2017-07-22 NOTE — Progress Notes (Signed)
Patient with increased myoclonus, changed from Versed to propofol can titrate to movement control. EEG remains burst suppression.  Roland Rack, MD [facility] [phone]  If 7pm- 7am, please page neurology on call as listed in [place].

## 2017-07-22 NOTE — ED Provider Notes (Signed)
[facility] DEPT Provider Note   CSN: [id number] Arrival date & time: 06/23/2017  2027     History   Chief Complaint Chief Complaint  Patient presents with  . Cardiac Arrest    HPI Richard Hopkins is a 71 y.o. male.  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 Patient presenting to the ER status post arrest. Patient fell on the bathtub family heard the thumb went upstairs and called EMS. On EMS arrival patient was pulseless and apneic. CPR initiated epi given as well as 1 round of CPR, return of spontaneous circulation achieved. Patient brought to the emergency department intubated.  Past Medical History:  Diagnosis Date  . ABNORMAL ELECTROCARDIOGRAM   . Acute prostatitis   . ADENOMATOUS COLONIC POLYP   . ALLERGIC RHINITIS   . BENIGN PROSTATIC HYPERTROPHY   . BLURRED VISION   . Brain tumor ([place])   . Cardiomyopathy ([place]) 01/12/2016  . CHRONIC OBSTRUCTIVE PULMONARY DISEASE, ACUTE EXACERBATION   . Chronic steroid use   . COPD   . COPD (chronic obstructive pulmonary disease) ([place])   . DIABETES MELLITUS, TYPE II   . DIVERTICULAR DISEASE   . DYSPNEA   . DYSPNEA/SHORTNESS OF BREATH   . EPIDIDYMO-ORCHITIS   . ERECTILE DYSFUNCTION, ORGANIC, HX OF   . ESOPHAGEAL STRICTURE   . FATIGUE   . GERD   . HYPERLIPIDEMIA   . HYPERTENSION   . LUMBAR DISC DISORDER   . MASS, SUPERFICIAL   . MUSCLE CRAMPS   . Nocturia   . Pain in soft tissues of limb   . PSA, INCREASED   . PULMONARY FIBROSIS   . SKIN LESION   . SMOKER   . SYNCOPE   . THRUSH   . Wheezing     Patient Active Problem List   Diagnosis Date Noted  . Cardiac arrest ([place]) 07/07/2017  . Elevated sed rate 07/09/2017  . Polymyalgia ([place]) 06/29/2017  . Gastroesophageal reflux disease 06/29/2017  . Closed compression fracture of L1 lumbar vertebra ([place]) 04/24/2017  . Asthmatic bronchitis 01/13/2017  . Bursitis of  left hip 01/13/2017  . 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 Diabetes mellitus with complication ([place]) 30/08/2329  . CKD (chronic kidney disease) 08/06/2016  . Cellulitis 08/06/2016  . Hematoma 08/06/2016  . Chronic diastolic (congestive) heart failure ([place]) 08/06/2016  . Cellulitis of left lower leg   . Coagulopathy ([place]) 08/05/2016  . Monocytosis 04/25/2016  . Cardiomyopathy ([place]) 01/12/2016  . 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 Dizziness 01/07/2016  . Blurred vision 01/07/2016  . Temporal lobe lesion 01/07/2016  . BPH (benign prostatic hypertrophy) 01/07/2016  . Abnormal CT scan, esophagus 07/25/2015  . Loss of weight 07/25/2015  . BPV (benign positional vertigo) 06/02/2015  . Cough 05/11/2015  . Type 2 diabetes mellitus ([place]) 04/13/2015  . GERD (gastroesophageal reflux disease) 04/13/2015  . Tobacco abuse 04/13/2015  . SOB (shortness of breath)   . CAP (community acquired pneumonia) 04/11/2015  . COPD exacerbation ([place]) 04/11/2015  . Acute respiratory failure with hypoxia ([place]) 04/11/2015  . Fatigue 04/03/2015  . Unspecified constipation 05/04/2014  . Lumbar radiculopathy 04/27/2014  . Acute blood loss anemia 02/21/2014  . Leukocytosis 02/21/2014  . 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 Bruising 02/18/2014  . Left leg pain 02/09/2014  . Rib pain on right side 10/06/2013  . Pulmonary nodules/lesions, multiple 03/04/2013  .  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 Current use of steroid medication 03/01/2013  . Prepatellar bursitis of left knee 09/16/2012  . Thrush 09/16/2012  . Scrotal rash 05/06/2012  . Skin lesion 05/06/2012  . Thumb pain, left 05/06/2012  . Brain tumor ([place]) 09/21/2011  . LUE weakness 09/17/2011  . Neck pain 08/20/2011  . Encounter for well adult exam with abnormal findings 05/05/2011  . SYNCOPE 01/16/2011  . SMOKER 08/30/2010  . PSA, INCREASED 06/26/2010  . ABNORMAL ELECTROCARDIOGRAM 08/24/2009  . BPH associated with  nocturia 10/09/2008  . Nocturia 10/09/2008  . 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 Hyperlipidemia 09/28/2007  . Essential hypertension 09/28/2007  . Allergic rhinitis 09/28/2007  . COPD GOLD III/IV  09/28/2007  . ESOPHAGEAL STRICTURE 09/28/2007  . GERD 09/28/2007  . Lumbar disc disease 09/28/2007  . FATIGUE 09/28/2007  . 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 DIVERTICULAR DISEASE 08/31/2007    Past Surgical History:  Procedure Laterality Date  . BACK SURGERY     x 4 disabled after 2003  . CARDIAC CATHETERIZATION N/A 03/26/2016   Procedure: Left Heart Cath and Coronary Angiography;  Surgeon: Josue Hector, MD;  Location: [facility];  Service: Cardiovascular;  Laterality: N/A;  . ELBOW ARTHROSCOPY         Home Medications    Prior to Admission medications   Medication Sig Start Date End Date Taking? Authorizing Provider  albuterol (PROAIR HFA) 108 (90 BASE) MCG/ACT inhaler 2 puffs every 4 hours as needed only  if your can't catch your breath Patient taking differently: Inhale 1-2 puffs into the lungs every 4 (four) hours as needed for wheezing or shortness of breath.  09/18/15   Biagio Borg, MD  budesonide (PULMICORT) 0.25 MG/2ML nebulizer solution Take 2 mLs (0.25 mg total) by nebulization 2 (two) times daily. DX: J44.9 05/28/17   Biagio Borg, MD  budesonide-formoterol [facility]) 160-4.5 MCG/ACT inhaler Inhale 2 puffs into the lungs 2 (two) times daily. 10/30/16   Biagio Borg, MD  cloNIDine (CATAPRES) 0.1 MG tablet Take 1 tablet (0.1 mg total) by mouth 2 (two) times daily. Patient taking differently: Take 0.1 mg by mouth daily.  04/24/17   Biagio Borg, MD  formoterol (PERFOROMIST) 20 MCG/2ML nebulizer solution Take 2 mLs (20 mcg total) by nebulization 2 (two) times daily. DX: J44.9 09/17/16   Biagio Borg, MD  furosemide (LASIX) 20 MG tablet Take 20 mg by mouth daily. 10/26/16   [provider]  gabapentin  (NEURONTIN) 300 MG capsule Take 1 capsule (300 mg total) by mouth at bedtime. 04/24/17   Biagio Borg, MD  ipratropium-albuterol (DUONEB) 0.5-2.5 (3) MG/3ML SOLN Take 3 mLs by nebulization every 6 (six) hours as needed (wheezing/shortness of breath). 09/18/15   Biagio Borg, MD  meclizine (ANTIVERT) 12.5 MG tablet TAKE 1 TABLET BY MOUTH THREE TIMES DAILY AS NEEDED FOR DIZZINESS 07/06/17   Biagio Borg, MD  metFORMIN (GLUCOPHAGE) 500 MG tablet TAKE ONE TABLET BY MOUTH TWICE DAILY WITH A MEAL Patient taking differently: TAKE 500 MG BY MOUTH TWICE DAILY WITH A MEAL 01/23/17   Biagio Borg, MD  metoCLOPramide (REGLAN) 10 MG tablet Take 1 tablet (10 mg total) by mouth every 8 (eight) hours as needed for nausea. 06/27/17   Fatima Blank, MD  omeprazole (PRILOSEC) 40 MG capsule Take 1 capsule (40 mg total) by mouth daily. 07/09/17   Biagio Borg, MD  oxyCODONE (  OXY IR/ROXICODONE) 5 MG immediate release tablet Take 1 tablet (5 mg total) by mouth every 4 (four) hours as needed for severe pain. 03/31/17   Janith Lima, MD  predniSONE (DELTASONE) 10 MG tablet 1 tab by mouth every other day Patient taking differently: Take 10 mg by mouth every other day.  04/24/17   Biagio Borg, MD  predniSONE (DELTASONE) 10 MG tablet 4tab by mouth x3day,3tabs x 3day,2tab x 3 days,1 tab x 3 days 07/09/17   Biagio Borg, MD  tamsulosin (FLOMAX) 0.4 MG CAPS capsule TAKE ONE CAPSULE BY MOUTH ONCE DAILY Patient taking differently: TAKE 0.4 MG BY MOUTH ONCE DAILY 06/05/17   Biagio Borg, MD    Family History Family History  Problem Relation Age of Onset  . Diabetes Mother   . Asthma Mother   . Emphysema Mother        smoker  . Colon cancer Neg Hx     Social History Social History  Substance Use Topics  . Smoking status: Current Every Day Smoker    Packs/day: 1.00    Years: 50.00    Types: Cigarettes  . Smokeless tobacco: Never Used  . Alcohol use No     Allergies   Doxycycline; Ace inhibitors; Cardura  [doxazosin mesylate]; Cozaar [losartan potassium]; Dexilant [dexlansoprazole]; Hydrocodone; Lisinopril; Norvasc [amlodipine besylate]; and Alfuzosin   Review of Systems Review of Systems  Unable to perform ROS: Intubated     Physical Exam Updated Vital Signs BP 114/84   Pulse 86   Temp (!) 95.4 F (35.2 C) (Core (Comment))   Resp 18   Ht 6\' 3"  (1.905 m)   SpO2 100%   Physical Exam  Constitutional: He appears well-nourished. No distress.  Intimated obese African American male.  HENT:  Head: Normocephalic.  Pupils midsize and fixed. Abrasion to left upper eyebrow.  Eyes: Conjunctivae are normal.  Cardiovascular: Normal rate and regular rhythm.   Pulmonary/Chest:  On the vent, clear lung sounds.  Abdominal: He exhibits distension.  Protuberant belly,  Skin: Skin is warm and dry. He is not diaphoretic.     ED Treatments / Results  Labs (all labs ordered are listed, but only abnormal results are displayed) Labs Reviewed  COMPREHENSIVE METABOLIC PANEL - Abnormal; Notable for the following:       Result Value   CO2 15 (*)    Glucose, Bld 242 (*)    Creatinine, Ser 2.00 (*)    Calcium 8.1 (*)    Total Protein 5.1 (*)    Albumin 2.8 (*)    AST 47 (*)    GFR calc non Af Amer 31 (*)    GFR calc Af Amer 36 (*)    Anion gap 19 (*)    All other components within normal limits  CBC WITH DIFFERENTIAL/PLATELET - Abnormal; Notable for the following:    WBC 20.8 (*)    Hemoglobin 12.9 (*)    MCHC 29.5 (*)    RDW 16.5 (*)    Neutro Abs 13.5 (*)    Lymphs Abs 5.0 (*)    Monocytes Absolute 1.9 (*)    All other components within normal limits  LIPASE, BLOOD - Abnormal; Notable for the following:    Lipase 69 (*)    All other components within normal limits  PROTIME-INR - Abnormal; Notable for the following:    Prothrombin Time 16.0 (*)    All other components within normal limits  URINALYSIS, ROUTINE W REFLEX MICROSCOPIC - Abnormal;  Notable for the following:    Color,  Urine STRAW (*)    Glucose, UA 50 (*)    Hgb urine dipstick MODERATE (*)    Protein, ur 30 (*)    Squamous Epithelial / LPF 0-5 (*)    All other components within normal limits  RAPID URINE DRUG SCREEN, HOSP PERFORMED - Abnormal; Notable for the following:    Benzodiazepines POSITIVE (*)    All other components within normal limits  GLUCOSE, CAPILLARY - Abnormal; Notable for the following:    Glucose-Capillary 154 (*)    All other components within normal limits  I-STAT CG4 LACTIC ACID, ED - Abnormal; Notable for the following:    Lactic Acid, Venous 13.43 (*)    All other components within normal limits  I-STAT ARTERIAL BLOOD GAS, ED - Abnormal; Notable for the following:    pH, Arterial 7.193 (*)    pCO2 arterial 50.3 (*)    pO2, Arterial 434.0 (*)    Bicarbonate 19.4 (*)    Acid-base deficit 9.0 (*)    All other components within normal limits  CULTURE, BLOOD (ROUTINE X 2)  CULTURE, BLOOD (ROUTINE X 2)  CULTURE, RESPIRATORY (NON-EXPECTORATED)  MRSA PCR SCREENING  BASIC METABOLIC PANEL  BASIC METABOLIC PANEL  PROTIME-INR  APTT  BLOOD GAS, ARTERIAL  CBC  MAGNESIUM  PHOSPHORUS  PROCALCITONIN  PROCALCITONIN  TROPONIN I  TROPONIN I  LACTIC ACID, PLASMA  LACTIC ACID, PLASMA  HIV ANTIBODY (ROUTINE TESTING)  BETA-HYDROXYBUTYRIC ACID  TROPONIN I  TROPONIN I  I-STAT TROPONIN, ED  TYPE AND SCREEN    EKG  EKG Interpretation None       Radiology Ct Head Wo Contrast  Result Date: 06/30/2017 CLINICAL DATA:  Cardiac arrest EXAM: CT HEAD WITHOUT CONTRAST TECHNIQUE: Contiguous axial images were obtained from the base of the skull through the vertex without intravenous contrast. COMPARISON:  Brain MRI 03/04/2016 FINDINGS: The examination is degraded by motion. Brain: No mass lesion, intraparenchymal hemorrhage or extra-axial collection. No evidence of acute cortical infarct. There is periventricular hypoattenuation compatible with chronic microvascular disease. Vascular: No  hyperdense vessel or unexpected calcification. Skull: Normal visualized skull base, calvarium and extracranial soft tissues. Sinuses/Orbits: There is partial opacification of all paranasal sinuses, greatest in the left frontal sinus. No mastoid or middle ear effusion. Normal orbits. IMPRESSION: Motion degraded examination without visible evidence of acute intracranial abnormality. Electronically Signed   By: Ulyses Jarred M.D.   On: 07/17/2017 21:22   Ct Chest W Contrast  Result Date: 06/22/2017 CLINICAL DATA:  Found unresponsive in a bathroom tonight. Arrived in cardiac arrest. EXAM: CT CHEST, ABDOMEN, AND PELVIS WITH CONTRAST TECHNIQUE: Multidetector CT imaging of the chest, abdomen and pelvis was performed following the standard protocol during bolus administration of intravenous contrast. CONTRAST:  100 cc Isovue-300 COMPARISON:  Chest CT dated 05/29/2015 and abdomen and pelvis CT dated 05/17/2015. FINDINGS: CT CHEST FINDINGS Cardiovascular: Atheromatous calcifications, including the thoracic aorta. Normal sized heart. Mediastinum/Nodes: Endotracheal tube tip in the trachea, 3.7 cm above the carina. Nasogastric tube in extending into the proximal stomach. Diffuse esophageal wall thickening. No enlarged lymph nodes. The included thyroid gland is unremarkable. Lungs/Pleura: Bilateral peripheral bullous changes. Minimal bilateral dependent atelectasis. Minimal patchy opacity in the left upper lobe. No pleural fluid or pneumothorax. Musculoskeletal: Approximately 20% T3 vertebral body compression deformity with mild sclerosis and visible fracture line. No bony retropulsion. Minimally displaced right anterior sixth rib fracture. Essentially nondisplaced left anterior fifth rib fracture. Multiple additional mild anterior  posterior cortex rib fracture deformities bilaterally. CT ABDOMEN PELVIS FINDINGS Hepatobiliary: No focal liver abnormality is seen. No gallstones, gallbladder wall thickening, or biliary  dilatation. Pancreas: Unremarkable. No pancreatic ductal dilatation or surrounding inflammatory changes. Spleen: Normal in size without focal abnormality. Adrenals/Urinary Tract: Normal appearing adrenal glands. Bilateral renal cysts. Unremarkable urinary bladder and ureters. No urinary tract calculi or hydronephrosis seen. Stomach/Bowel: Dilated stomach. Nasogastric tube tip in the proximal stomach. Unremarkable small bowel, colon and appendix. Vascular/Lymphatic: Atheromatous arterial calcifications and plaques without aneurysm. No enlarged lymph nodes. Reproductive: Mildly enlarged prostate gland appear Other: No abdominal wall hernia or abnormality. No abdominopelvic ascites. Musculoskeletal: Stable old L1 30% compression deformity. No acute fractures or subluxations. Lumbar spine degenerative changes. Stable mottled appearance of the L5 and L4 vertebral bodies with discogenic sclerosis. IMPRESSION: 1. Approximately 20% acute T3 vertebral body compression deformity without bony retropulsion. 2. Multiple bilateral anterior rib fractures and fracture deformities. 3. Minimal patchy opacity in the left upper lobe. This could represent pulmonary contusion, infection or aspiration pneumonitis pre 4. Mild changes of COPD with paraseptal emphysema. 5. Diffuse esophageal wall thickening. This could be due to esophagitis. Diffuse esophageal wall hemorrhage could also have this appearance. 6. Aortic atherosclerosis. Electronically Signed   By: Claudie Revering M.D.   On: 07/03/2017 22:11   Ct Cervical Spine Wo Contrast  Result Date: 06/26/2017 CLINICAL DATA:  Unresponsive, cardiac arrest EXAM: CT MAXILLOFACIAL WITHOUT CONTRAST CT CERVICAL SPINE WITHOUT CONTRAST TECHNIQUE: Multidetector CT imaging of the maxillofacial structures was performed. Multiplanar CT image reconstructions were also generated. A small metallic BB was placed on the right temple in order to reliably differentiate right from left. Multidetector CT  imaging of the cervical spine was performed without intravenous contrast. Multiplanar CT image reconstructions were also generated. COMPARISON:  None. FINDINGS: Motion degraded images. CT MAXILLOFACIAL FINDINGS Osseous: No evidence of maxillofacial fracture. Mandible is severely motion degraded but appears intact. Bilateral mandibular condyles are well-seated in the TMJs. Orbits: Bilateral orbits, including the globes and retroconal soft tissues, are within normal limits. Sinuses: Partial opacification of the bilateral sphenoid and ethmoid sinuses. New complete opacification of the left frontal sinus. Bilateral mastoid air cells are clear. Soft tissues: Mild soft tissue swelling overlying the left frontal bone. Limited intracranial: No significant or unexpected finding. CT CERVICAL FINDINGS Alignment: Normal cervical lordosis. Skull base and vertebrae: Upper cervical spine is motion degraded. Within that constraint, there is no evidence of acute fracture. No primary bone lesion or focal pathologic process. Soft tissues and spinal canal: No prevertebral fluid or swelling. No visible canal hematoma. Disc levels:  Spinal canal is patent. Upper chest: Visualized thyroid is unremarkable. Other: None. IMPRESSION: Motion degraded images. Mild soft tissue swelling overlying the left frontal bone. No evidence of maxillofacial fracture. No evidence of traumatic injury to the cervical spine. Electronically Signed   By: Julian Hy M.D.   On: 07/14/2017 21:52   Ct Abdomen Pelvis W Contrast  Result Date: 07/20/2017 CLINICAL DATA:  Found unresponsive in a bathroom tonight. Arrived in cardiac arrest. EXAM: CT CHEST, ABDOMEN, AND PELVIS WITH CONTRAST TECHNIQUE: Multidetector CT imaging of the chest, abdomen and pelvis was performed following the standard protocol during bolus administration of intravenous contrast. CONTRAST:  100 cc Isovue-300 COMPARISON:  Chest CT dated 05/29/2015 and abdomen and pelvis CT dated  05/17/2015. FINDINGS: CT CHEST FINDINGS Cardiovascular: Atheromatous calcifications, including the thoracic aorta. Normal sized heart. Mediastinum/Nodes: Endotracheal tube tip in the trachea, 3.7 cm above the carina. Nasogastric tube in  extending into the proximal stomach. Diffuse esophageal wall thickening. No enlarged lymph nodes. The included thyroid gland is unremarkable. Lungs/Pleura: Bilateral peripheral bullous changes. Minimal bilateral dependent atelectasis. Minimal patchy opacity in the left upper lobe. No pleural fluid or pneumothorax. Musculoskeletal: Approximately 20% T3 vertebral body compression deformity with mild sclerosis and visible fracture line. No bony retropulsion. Minimally displaced right anterior sixth rib fracture. Essentially nondisplaced left anterior fifth rib fracture. Multiple additional mild anterior posterior cortex rib fracture deformities bilaterally. CT ABDOMEN PELVIS FINDINGS Hepatobiliary: No focal liver abnormality is seen. No gallstones, gallbladder wall thickening, or biliary dilatation. Pancreas: Unremarkable. No pancreatic ductal dilatation or surrounding inflammatory changes. Spleen: Normal in size without focal abnormality. Adrenals/Urinary Tract: Normal appearing adrenal glands. Bilateral renal cysts. Unremarkable urinary bladder and ureters. No urinary tract calculi or hydronephrosis seen. Stomach/Bowel: Dilated stomach. Nasogastric tube tip in the proximal stomach. Unremarkable small bowel, colon and appendix. Vascular/Lymphatic: Atheromatous arterial calcifications and plaques without aneurysm. No enlarged lymph nodes. Reproductive: Mildly enlarged prostate gland appear Other: No abdominal wall hernia or abnormality. No abdominopelvic ascites. Musculoskeletal: Stable old L1 30% compression deformity. No acute fractures or subluxations. Lumbar spine degenerative changes. Stable mottled appearance of the L5 and L4 vertebral bodies with discogenic sclerosis. IMPRESSION:  1. Approximately 20% acute T3 vertebral body compression deformity without bony retropulsion. 2. Multiple bilateral anterior rib fractures and fracture deformities. 3. Minimal patchy opacity in the left upper lobe. This could represent pulmonary contusion, infection or aspiration pneumonitis pre 4. Mild changes of COPD with paraseptal emphysema. 5. Diffuse esophageal wall thickening. This could be due to esophagitis. Diffuse esophageal wall hemorrhage could also have this appearance. 6. Aortic atherosclerosis. Electronically Signed   By: Claudie Revering M.D.   On: 07/19/2017 22:11   Dg Chest Portable 1 View  Result Date: 07/04/2017 CLINICAL DATA:  71 year old male with cardiac arrest. Evaluate for endotracheal and enteric tube placement. EXAM: PORTABLE CHEST 1 VIEW COMPARISON:  Radiograph dated 01/07/2016 FINDINGS: An endotracheal tube is noted with tip approximately 4 cm above the carina. An enteric tube extends below the diaphragm. The side port of the enteric tube is above the diaphragm likely in the distal esophagus. The tip of the tube is in the proximal stomach. Recommend advancing the tube further into the stomach. There is mild eventration of the left hemidiaphragm. No focal consolidation, pleural effusion, or pneumothorax. The cardiac silhouette is within normal limits. There is osteopenia. No acute osseous pathology. There is gaseous distention of the stomach. IMPRESSION: 1. Endotracheal tube above the carina. Enteric tube side port is in the distal esophagus and tip in the proximal stomach. Recommend advancing the tube further into the stomach. 2. No acute cardiopulmonary process. Electronically Signed   By: Anner Crete M.D.   On: 06/29/2017 21:06   Ct Maxillofacial Wo Contrast  Result Date: 07/11/2017 CLINICAL DATA:  Unresponsive, cardiac arrest EXAM: CT MAXILLOFACIAL WITHOUT CONTRAST CT CERVICAL SPINE WITHOUT CONTRAST TECHNIQUE: Multidetector CT imaging of the maxillofacial structures was  performed. Multiplanar CT image reconstructions were also generated. A small metallic BB was placed on the right temple in order to reliably differentiate right from left. Multidetector CT imaging of the cervical spine was performed without intravenous contrast. Multiplanar CT image reconstructions were also generated. COMPARISON:  None. FINDINGS: Motion degraded images. CT MAXILLOFACIAL FINDINGS Osseous: No evidence of maxillofacial fracture. Mandible is severely motion degraded but appears intact. Bilateral mandibular condyles are well-seated in the TMJs. Orbits: Bilateral orbits, including the globes and retroconal soft tissues, are  within normal limits. Sinuses: Partial opacification of the bilateral sphenoid and ethmoid sinuses. New complete opacification of the left frontal sinus. Bilateral mastoid air cells are clear. Soft tissues: Mild soft tissue swelling overlying the left frontal bone. Limited intracranial: No significant or unexpected finding. CT CERVICAL FINDINGS Alignment: Normal cervical lordosis. Skull base and vertebrae: Upper cervical spine is motion degraded. Within that constraint, there is no evidence of acute fracture. No primary bone lesion or focal pathologic process. Soft tissues and spinal canal: No prevertebral fluid or swelling. No visible canal hematoma. Disc levels:  Spinal canal is patent. Upper chest: Visualized thyroid is unremarkable. Other: None. IMPRESSION: Motion degraded images. Mild soft tissue swelling overlying the left frontal bone. No evidence of maxillofacial fracture. No evidence of traumatic injury to the cervical spine. Electronically Signed   By: Julian Hy M.D.   On: 06/27/2017 21:52    Procedures Procedures (including critical care time)  Medications Ordered in ED Medications  Tdap (BOOSTRIX) injection 0.5 mL (not administered)  midazolam (VERSED) 50 mg in sodium chloride 0.9 % 50 mL (1 mg/mL) infusion (2 mg/hr Intravenous New Bag/Given 07/19/2017 2113)    norepinephrine (LEVOPHED) 4 mg in dextrose 5 % 250 mL (0.016 mg/mL) infusion (5 mcg/min Intravenous New Bag/Given 06/30/2017 2159)  heparin injection 5,000 Units (5,000 Units Subcutaneous Given 07/19/2017 2331)  0.9 %  sodium chloride infusion ( Intravenous New Bag/Given 07/02/2017 2331)  famotidine (PEPCID) IVPB 20 mg premix (20 mg Intravenous New Bag/Given 06/24/2017 2342)  fentaNYL (SUBLIMAZE) injection 50 mcg (not administered)  fentaNYL (SUBLIMAZE) injection 50 mcg (not administered)  insulin aspart (novoLOG) injection 2-6 Units (4 Units Subcutaneous Given 07/04/2017 2338)  ipratropium-albuterol (DUONEB) 0.5-2.5 (3) MG/3ML nebulizer solution 3 mL (not administered)  albuterol (PROVENTIL) (2.5 MG/3ML) 0.083% nebulizer solution 2.5 mg (not administered)  piperacillin-tazobactam (ZOSYN) IVPB 3.375 g (3.375 g Intravenous Not Given 07/15/2017 2332)  vancomycin (VANCOCIN) 1,500 mg in sodium chloride 0.9 % 500 mL IVPB (1,500 mg Intravenous New Bag/Given 07/20/2017 2335)  piperacillin-tazobactam (ZOSYN) IVPB 3.375 g (not administered)  vancomycin (VANCOCIN) 1,250 mg in sodium chloride 0.9 % 250 mL IVPB (not administered)  valproate (DEPACON) 500 mg in dextrose 5 % 50 mL IVPB (not administered)  levETIRAcetam (KEPPRA) 750 mg in sodium chloride 0.9 % 100 mL IVPB (750 mg Intravenous New Bag/Given 07/11/2017 2354)  iopamidol (ISOVUE-300) 61 % injection (100 mLs  Contrast Given 07/01/2017 2100)  LORazepam (ATIVAN) 2 MG/ML injection (2 mg  Given 07/11/2017 2057)  LORazepam (ATIVAN) injection 2 mg (2 mg Intravenous Given 07/09/2017 2053)  levETIRAcetam (KEPPRA) 1,000 mg in sodium chloride 0.9 % 100 mL IVPB (0 mg Intravenous Stopped 07/06/2017 2128)  midazolam (VERSED) bolus via infusion 20 mg (20 mg Intravenous Bolus from Bag 06/28/2017 2117)  aspirin suppository 300 mg (300 mg Rectal Given 07/14/2017 2309)  valproate (DEPACON) 2,775 mg in dextrose 5 % 50 mL IVPB (0 mg Intravenous Stopped 07/20/2017 2304)  sodium chloride 0.9 % bolus 500 mL (0  mLs Intravenous Stopped 06/24/2017 2227)     Initial Impression / Assessment and Plan / ED Course  I have reviewed the triage vital signs and the nursing notes.  Pertinent labs & imaging results that were available during my care of the patient were reviewed by me and considered in my medical decision making (see chart for details).     Patient is 71 year old male brought in post arrest. Unclear whether arrest precipitated the fall, or fall precipitated the rest.. EKG is nonischemic. Initially thought perhaps a  head bleed could be the root cause. CT head shows no evidence of bleeding. Therefore touch base with cardiology who agreed there is no ischemic event present on the EKG. Patient started seizing while in the emergency department. 2 mg of Ativan given 2. Thousand milligrams of Keppra given. Patient continued to seize. Discussed with neurology. Fentanyl bolus given and fentanyl hung  Patient scan of CT ordered given unclear history. He shows fractures of ribs, no evidence of intracranial hemorrhage, or abdominal pathology explaining patient's condition.  Discussed with neurology, we'll bring in EEG for monitoring for status.  Will admit to critical care.  CRITICAL CARE Performed by: Gardiner Sleeper Total critical care time: 60 minutes Critical care time was exclusive of separately billable procedures and treating other patients. Critical care was necessary to treat or prevent imminent or life-threatening deterioration. Critical care was time spent personally by me on the following activities: development of treatment plan with patient and/or surrogate as well as nursing, discussions with consultants, evaluation of patient's response to treatment, examination of patient, obtaining history from patient or surrogate, ordering and performing treatments and interventions, ordering and review of laboratory studies, ordering and review of radiographic studies, pulse oximetry and re-evaluation  of patient's condition.   Final Clinical Impressions(s) / ED Diagnoses   Final diagnoses:  None    New Prescriptions Current Discharge Medication List       Macarthur Critchley, MD 07/22/17 [id number]

## 2017-07-22 NOTE — Progress Notes (Signed)
STAT LTM up and running  

## 2017-07-22 NOTE — Progress Notes (Signed)
  Echocardiogram 2D Echocardiogram has been performed.  Richard Hopkins M 07/22/2017, 2:27 PM

## 2017-07-22 NOTE — Progress Notes (Signed)
No ongoing sz on EEG, in fact appears to be burst suppression. Given the large dose of benzodiazepine received earlier, this could be contributing to the pattern on EEG.   Roland Rack, MD [facility] [phone]  If 7pm- 7am, please page neurology on call as listed in [place].

## 2017-07-22 NOTE — Progress Notes (Signed)
*  Preliminary Results* Bilateral lower extremity venous duplex completed. Bilateral lower extremities are negative for deep vein thrombosis. There is no evidence of Baker's cyst bilaterally.  07/22/2017 2:56 PM Maudry Mayhew, BS, RVT, RDCS, RDMS

## 2017-07-22 NOTE — Progress Notes (Signed)
eLink Physician-Brief Progress Note Patient Name: Richard Hopkins DOB: 26-Apr-1946 MRN: [id number]   Date of Service  07/22/2017  HPI/Events of Note  ABG 7.19/50/434/100%  eICU Interventions  Increase RR on vent from 18 to 24 Wean PEEP and FiO2     Intervention Category Evaluation Type: Other  Vaidehi Braddy 07/22/2017, 4:21 AM

## 2017-07-22 NOTE — Progress Notes (Signed)
PULMONARY / CRITICAL CARE MEDICINE   Name: Richard Hopkins MRN: [id number] DOB: 10-25-1946    ADMISSION DATE:  06/22/2017 CONSULTATION DATE:  07/20/2017  REFERRING MD:  Dr. 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 Patient presented to ED intubated, while in ED had seizure activity. CT Head negative. Neurology consulted. PCCM asked to admit.   Family reports that last few weeks patient has been complaining of increased shortness of breath and cough with yellow sputum production.    SUBJECTIVE:  Myoclonus noted overnight. Propofol started with some improvement  Continuous EEG running  VITAL SIGNS: BP 128/85   Pulse 96   Temp (!) 97 F (36.1 C) (Core (Comment))   Resp (!) 24   Ht 6\' 3"  (1.905 m)   SpO2 100%   HEMODYNAMICS:    VENTILATOR SETTINGS: Vent Mode: PRVC FiO2 (%):  [40 %-100 %] 40 % Set Rate:  [18 bmp-24 bmp] 24 bmp Vt Set:  [680 mL] 680 mL PEEP:  [5 cmH20] 5 cmH20 Plateau Pressure:  [19 cmH20-25 cmH20] 20 cmH20  INTAKE / OUTPUT: I/O last 3 completed shifts: In: 437.4 [I.V.:179.9; IV Piggyback:257.5] Out: 800 [Urine:800]  PHYSICAL EXAMINATION: General:  Adult male, no distress, intubated and sedated  Neuro:  Sedated, sluggish pupillary response, does not follow commands, does not moves extremities   HEENT:  ETT in place  Cardiovascular:  RRR, no MRG Lungs:  Diminished breath sounds, no wheeze Abdomen:  Distended, active bowel  sounds Musculoskeletal:  -edema  Skin:  Dry, upper eye laceration    LABS:  BMET  Recent Labs Lab 07/06/2017 2040 07/22/17 0223  NA 139 140  K 4.4 4.1  CL 105 109  CO2 15* 18*  BUN 16 17  CREATININE 2.00* 1.62*  GLUCOSE 242* 94    Electrolytes  Recent Labs Lab 07/08/2017 2040 07/22/17 0223  CALCIUM 8.1* 7.6*    CBC  Recent Labs Lab 07/10/2017 2040  WBC 20.8*  HGB 12.9*  HCT 43.7  PLT 243    Coag's  Recent Labs Lab 07/19/2017 2040 07/22/17 0223  APTT  --  30  INR 1.28 1.17    Sepsis Markers  Recent Labs Lab 06/22/2017 2047 07/05/2017 2246 07/22/17 0223  LATICACIDVEN 13.43* 5.7*  --   PROCALCITON  --   --  0.22    ABG  Recent Labs Lab 06/27/2017 2159  PHART 7.193*  PCO2ART 50.3*  PO2ART 434.0*    Liver Enzymes  Recent Labs Lab 07/02/2017 2040  AST 47*  ALT 30  ALKPHOS 52  BILITOT 0.6  ALBUMIN 2.8*    Cardiac Enzymes No results for input(s): TROPONINI, PROBNP in the last 168 hours.  Glucose  Recent Labs Lab 06/23/2017 2336 07/22/17 0134 07/22/17 0408 07/22/17 0821  GLUCAP 154* 103* 82 85    Imaging Ct Head Wo Contrast  Result Date: 07/17/2017 CLINICAL DATA:  Cardiac arrest EXAM: CT HEAD WITHOUT CONTRAST TECHNIQUE: Contiguous axial images were obtained from the  base of the skull through the vertex without intravenous contrast. COMPARISON:  Brain MRI 03/04/2016 FINDINGS: The examination is degraded by motion. Brain: No mass lesion, intraparenchymal hemorrhage or extra-axial collection. No evidence of acute cortical infarct. There is periventricular hypoattenuation compatible with chronic microvascular disease. Vascular: No hyperdense vessel or unexpected calcification. Skull: Normal visualized skull base, calvarium and extracranial soft tissues. Sinuses/Orbits: There is partial opacification of all paranasal sinuses, greatest in the left frontal sinus. No mastoid or middle ear effusion. Normal orbits. IMPRESSION: Motion degraded  examination without visible evidence of acute intracranial abnormality. Electronically Signed   By: Ulyses Jarred M.D.   On: 06/21/2017 21:22   Ct Chest W Contrast  Result Date: 07/01/2017 CLINICAL DATA:  Found unresponsive in a bathroom tonight. Arrived in cardiac arrest. EXAM: CT CHEST, ABDOMEN, AND PELVIS WITH CONTRAST TECHNIQUE: Multidetector CT imaging of the chest, abdomen and pelvis was performed following the standard protocol during bolus administration of intravenous contrast. CONTRAST:  100 cc Isovue-300 COMPARISON:  Chest CT dated 05/29/2015 and abdomen and pelvis CT dated 05/17/2015. FINDINGS: CT CHEST FINDINGS Cardiovascular: Atheromatous calcifications, including the thoracic aorta. Normal sized heart. Mediastinum/Nodes: Endotracheal tube tip in the trachea, 3.7 cm above the carina. Nasogastric tube in extending into the proximal stomach. Diffuse esophageal wall thickening. No enlarged lymph nodes. The included thyroid gland is unremarkable. Lungs/Pleura: Bilateral peripheral bullous changes. Minimal bilateral dependent atelectasis. Minimal patchy opacity in the left upper lobe. No pleural fluid or pneumothorax. Musculoskeletal: Approximately 20% T3 vertebral body compression deformity with mild sclerosis and visible fracture line. No bony retropulsion. Minimally displaced right anterior sixth rib fracture. Essentially nondisplaced left anterior fifth rib fracture. Multiple additional mild anterior posterior cortex rib fracture deformities bilaterally. CT ABDOMEN PELVIS FINDINGS Hepatobiliary: No focal liver abnormality is seen. No gallstones, gallbladder wall thickening, or biliary dilatation. Pancreas: Unremarkable. No pancreatic ductal dilatation or surrounding inflammatory changes. Spleen: Normal in size without focal abnormality. Adrenals/Urinary Tract: Normal appearing adrenal glands. Bilateral renal cysts. Unremarkable urinary bladder and ureters. No urinary tract calculi or hydronephrosis  seen. Stomach/Bowel: Dilated stomach. Nasogastric tube tip in the proximal stomach. Unremarkable small bowel, colon and appendix. Vascular/Lymphatic: Atheromatous arterial calcifications and plaques without aneurysm. No enlarged lymph nodes. Reproductive: Mildly enlarged prostate gland appear Other: No abdominal wall hernia or abnormality. No abdominopelvic ascites. Musculoskeletal: Stable old L1 30% compression deformity. No acute fractures or subluxations. Lumbar spine degenerative changes. Stable mottled appearance of the L5 and L4 vertebral bodies with discogenic sclerosis. IMPRESSION: 1. Approximately 20% acute T3 vertebral body compression deformity without bony retropulsion. 2. Multiple bilateral anterior rib fractures and fracture deformities. 3. Minimal patchy opacity in the left upper lobe. This could represent pulmonary contusion, infection or aspiration pneumonitis pre 4. Mild changes of COPD with paraseptal emphysema. 5. Diffuse esophageal wall thickening. This could be due to esophagitis. Diffuse esophageal wall hemorrhage could also have this appearance. 6. Aortic atherosclerosis. Electronically Signed   By: Claudie Revering M.D.   On: 07/06/2017 22:11   Ct Cervical Spine Wo Contrast  Result Date: 07/05/2017 CLINICAL DATA:  Unresponsive, cardiac arrest EXAM: CT MAXILLOFACIAL WITHOUT CONTRAST CT CERVICAL SPINE WITHOUT CONTRAST TECHNIQUE: Multidetector CT imaging of the maxillofacial structures was performed. Multiplanar CT image reconstructions were also generated. A small metallic BB was placed on the right temple in order to reliably differentiate right from left. Multidetector CT imaging of the cervical spine was performed without intravenous contrast. Multiplanar CT image reconstructions were also generated. COMPARISON:  None. FINDINGS: Motion degraded images.  CT MAXILLOFACIAL FINDINGS Osseous: No evidence of maxillofacial fracture. Mandible is severely motion degraded but appears intact. Bilateral  mandibular condyles are well-seated in the TMJs. Orbits: Bilateral orbits, including the globes and retroconal soft tissues, are within normal limits. Sinuses: Partial opacification of the bilateral sphenoid and ethmoid sinuses. New complete opacification of the left frontal sinus. Bilateral mastoid air cells are clear. Soft tissues: Mild soft tissue swelling overlying the left frontal bone. Limited intracranial: No significant or unexpected finding. CT CERVICAL FINDINGS Alignment: Normal cervical lordosis. Skull base and vertebrae: Upper cervical spine is motion degraded. Within that constraint, there is no evidence of acute fracture. No primary bone lesion or focal pathologic process. Soft tissues and spinal canal: No prevertebral fluid or swelling. No visible canal hematoma. Disc levels:  Spinal canal is patent. Upper chest: Visualized thyroid is unremarkable. Other: None. IMPRESSION: Motion degraded images. Mild soft tissue swelling overlying the left frontal bone. No evidence of maxillofacial fracture. No evidence of traumatic injury to the cervical spine. Electronically Signed   By: Julian Hy M.D.   On: 06/28/2017 21:52   Ct Abdomen Pelvis W Contrast  Result Date: 07/03/2017 CLINICAL DATA:  Found unresponsive in a bathroom tonight. Arrived in cardiac arrest. EXAM: CT CHEST, ABDOMEN, AND PELVIS WITH CONTRAST TECHNIQUE: Multidetector CT imaging of the chest, abdomen and pelvis was performed following the standard protocol during bolus administration of intravenous contrast. CONTRAST:  100 cc Isovue-300 COMPARISON:  Chest CT dated 05/29/2015 and abdomen and pelvis CT dated 05/17/2015. FINDINGS: CT CHEST FINDINGS Cardiovascular: Atheromatous calcifications, including the thoracic aorta. Normal sized heart. Mediastinum/Nodes: Endotracheal tube tip in the trachea, 3.7 cm above the carina. Nasogastric tube in extending into the proximal stomach. Diffuse esophageal wall thickening. No enlarged lymph nodes.  The included thyroid gland is unremarkable. Lungs/Pleura: Bilateral peripheral bullous changes. Minimal bilateral dependent atelectasis. Minimal patchy opacity in the left upper lobe. No pleural fluid or pneumothorax. Musculoskeletal: Approximately 20% T3 vertebral body compression deformity with mild sclerosis and visible fracture line. No bony retropulsion. Minimally displaced right anterior sixth rib fracture. Essentially nondisplaced left anterior fifth rib fracture. Multiple additional mild anterior posterior cortex rib fracture deformities bilaterally. CT ABDOMEN PELVIS FINDINGS Hepatobiliary: No focal liver abnormality is seen. No gallstones, gallbladder wall thickening, or biliary dilatation. Pancreas: Unremarkable. No pancreatic ductal dilatation or surrounding inflammatory changes. Spleen: Normal in size without focal abnormality. Adrenals/Urinary Tract: Normal appearing adrenal glands. Bilateral renal cysts. Unremarkable urinary bladder and ureters. No urinary tract calculi or hydronephrosis seen. Stomach/Bowel: Dilated stomach. Nasogastric tube tip in the proximal stomach. Unremarkable small bowel, colon and appendix. Vascular/Lymphatic: Atheromatous arterial calcifications and plaques without aneurysm. No enlarged lymph nodes. Reproductive: Mildly enlarged prostate gland appear Other: No abdominal wall hernia or abnormality. No abdominopelvic ascites. Musculoskeletal: Stable old L1 30% compression deformity. No acute fractures or subluxations. Lumbar spine degenerative changes. Stable mottled appearance of the L5 and L4 vertebral bodies with discogenic sclerosis. IMPRESSION: 1. Approximately 20% acute T3 vertebral body compression deformity without bony retropulsion. 2. Multiple bilateral anterior rib fractures and fracture deformities. 3. Minimal patchy opacity in the left upper lobe. This could represent pulmonary contusion, infection or aspiration pneumonitis pre 4. Mild changes of COPD with  paraseptal emphysema. 5. Diffuse esophageal wall thickening. This could be due to esophagitis. Diffuse esophageal wall hemorrhage could also have this appearance. 6. Aortic atherosclerosis. Electronically Signed   By: Claudie Revering M.D.   On: 07/10/2017 22:11   Dg Chest Port 1 View  Result Date: 07/22/2017  CLINICAL DATA:  Intubation. EXAM: PORTABLE CHEST 1 VIEW COMPARISON:  CT 06/26/2017.  Chest x-ray 07/01/2017. FINDINGS: Endotracheal tube, NG tube in stable position. Heart size normal. Mild bibasilar atelectasis and/or scarring. No pneumothorax noted. Rib fractures best demonstrated by recent CT. IMPRESSION: 1.  Lines and tubes in stable position. 2. Bibasilar atelectasis and/or scarring. Stable elevation left hemidiaphragm. 3. Rib fractures best demonstrated by prior recent CT. No pneumothorax. Electronically Signed   By: Marcello Moores  Register   On: 07/22/2017 06:50   Dg Chest Portable 1 View  Result Date: 06/21/2017 CLINICAL DATA:  71 year old male with cardiac arrest. Evaluate for endotracheal and enteric tube placement. EXAM: PORTABLE CHEST 1 VIEW COMPARISON:  Radiograph dated 01/07/2016 FINDINGS: An endotracheal tube is noted with tip approximately 4 cm above the carina. An enteric tube extends below the diaphragm. The side port of the enteric tube is above the diaphragm likely in the distal esophagus. The tip of the tube is in the proximal stomach. Recommend advancing the tube further into the stomach. There is mild eventration of the left hemidiaphragm. No focal consolidation, pleural effusion, or pneumothorax. The cardiac silhouette is within normal limits. There is osteopenia. No acute osseous pathology. There is gaseous distention of the stomach. IMPRESSION: 1. Endotracheal tube above the carina. Enteric tube side port is in the distal esophagus and tip in the proximal stomach. Recommend advancing the tube further into the stomach. 2. No acute cardiopulmonary process. Electronically Signed   By: Anner Crete M.D.   On: 07/04/2017 21:06   Ct Maxillofacial Wo Contrast  Result Date: 06/22/2017 CLINICAL DATA:  Unresponsive, cardiac arrest EXAM: CT MAXILLOFACIAL WITHOUT CONTRAST CT CERVICAL SPINE WITHOUT CONTRAST TECHNIQUE: Multidetector CT imaging of the maxillofacial structures was performed. Multiplanar CT image reconstructions were also generated. A small metallic BB was placed on the right temple in order to reliably differentiate right from left. Multidetector CT imaging of the cervical spine was performed without intravenous contrast. Multiplanar CT image reconstructions were also generated. COMPARISON:  None. FINDINGS: Motion degraded images. CT MAXILLOFACIAL FINDINGS Osseous: No evidence of maxillofacial fracture. Mandible is severely motion degraded but appears intact. Bilateral mandibular condyles are well-seated in the TMJs. Orbits: Bilateral orbits, including the globes and retroconal soft tissues, are within normal limits. Sinuses: Partial opacification of the bilateral sphenoid and ethmoid sinuses. New complete opacification of the left frontal sinus. Bilateral mastoid air cells are clear. Soft tissues: Mild soft tissue swelling overlying the left frontal bone. Limited intracranial: No significant or unexpected finding. CT CERVICAL FINDINGS Alignment: Normal cervical lordosis. Skull base and vertebrae: Upper cervical spine is motion degraded. Within that constraint, there is no evidence of acute fracture. No primary bone lesion or focal pathologic process. Soft tissues and spinal canal: No prevertebral fluid or swelling. No visible canal hematoma. Disc levels:  Spinal canal is patent. Upper chest: Visualized thyroid is unremarkable. Other: None. IMPRESSION: Motion degraded images. Mild soft tissue swelling overlying the left frontal bone. No evidence of maxillofacial fracture. No evidence of traumatic injury to the cervical spine. Electronically Signed   By: Julian Hy M.D.   On:  07/01/2017 21:52     STUDIES:  CXR 7/31 > Endotracheal tube above the carina. Enteric tube side port is in the distal esophagus and tip in the proximal stomach. Recommend advancing the tube further into the stomach. No acute cardiopulmonary process CT Head 7/31 > no acute CT Chest/A/P 7/31 >> minimal patchy opacity left upper lobe, bilateral dependent atelectasis, minimally displaced right anterior  sixth rib fracture, nondisplaced left anterior fifth rib fracture, multiple anterior posterior cortex rib fracture deformities, diffuse esophageal wall thickening  CULTURES: Blood 7/31 > Sputum 7/31 > UA 7/31 >>   ANTIBIOTICS: Vancomycin 7/31 >> Zosyn 7/31 >>   SIGNIFICANT EVENTS: 7/31 > ED post-cardiac arrest   LINES/TUBES: ETT 7/31 >> OG 7/31 >>   DISCUSSION: 71 year old male presents to ED post-cardiac arrest. Found down at home in bathroom unresponsive, 9 minutes before return of ROSC (unknown before EMS arrived if pulseless, but estimate 20 minutes)   ASSESSMENT / PLAN:  PULMONARY A: Acute on Chronic Hypoxic Respiratory Distress  -family reports recent dyspnea and productive sputum production  H/O Reported COPD > CT chest ?pulmonary fibrosis, Smoker, pulmonary nodules  -on 10 mg prednisone daily  P:   PRVC 8cc/kg  Follow chest x-ray VAP prevention orders Scheduled DuoNeb Note multiple rib fractures, at risk for flail chest physiology although most are nondisplaced Assessment possible spontaneous breathing depending on neurological status and improvement   CARDIOVASCULAR A:  Asystole/PEA Cardiac Arrest secondary to ?hypoxia, possible other acute event consider VTE G1DD HF (EF 40-45) - April 2017 Cath EF 55%  H/O HLD, HTN Shock, cardiogenic  P:  Normothermic protocol initiated Echocardiogram pending Follow CVP, hold diuresis at this time Pressors weaned to off Hydralazine for BP control, Goal SBP < 180  RENAL A:   Lactic Acidosis secondary to seizure vs  infection vs medication effect; clearing Acute renal failure, hypoperfusion ATN P:   Follow BMP, urine output Ensure adequate renal perfusion, avoid nephrotoxins  GASTROINTESTINAL A:   GERD Esophageal stricture, thickening noted on CT scan P:   NPO PPI   HEMATOLOGIC A:   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 With unknown etiology  -Recent reported yellow-sputum production, cxr clear  Leukocytosis  P:   Empiric vancomycin, Zosyn Follow culture data Taper antibiotics to cultures  ENDOCRINE A:   DM   P:   Trend Glucose  SSI Hold metformin  NEUROLOGIC A:   Status Epilepticus, postanoxic event. Question global ischemia versus a seizure focus, history glioma Metabolic vs Anoxic Encephalopathy  H/O Left Temporal lobe mass, glioma  H/O Lumbar vertebral fracture, degenerative disc disease s/p multiple laminectomies  P:   RASS goal: -4/-5  Appreciate neurology assistance Keppra as ordered Propofol initiated to suppress myoclonus Depending on neurological improvement over the next 24-48 hours we will need to discuss overall prognosis with family. I did explain to his daughter today that this may be a profound injury from which he cannot recover Continues EEG ongoing  FAMILY  - Updates: Discuss patient's status, possible neurological injury, prognosis with his daughter at bedside 8/1  - Inter-disciplinary family meet or Palliative Care meeting due by:  07/28/2017   Independent CC time 43 minutes  Baltazar Apo, MD, PhD 07/22/2017, 1:31 PM [facility] [phone] or if no answer [phone]

## 2017-07-22 NOTE — Progress Notes (Signed)
Peripherally Inserted Central Catheter/Midline Placement  The IV Nurse has discussed with the patient and/or persons authorized to consent for the patient, the purpose of this procedure and the potential benefits and risks involved with this procedure.  The benefits include less needle sticks, lab draws from the catheter, and the patient may be discharged home with the catheter. Risks include, but not limited to, infection, bleeding, blood clot (thrombus formation), and puncture of an artery; nerve damage and irregular heartbeat and possibility to perform a PICC exchange if needed/ordered by physician.  Alternatives to this procedure were also discussed.  Bard Power PICC patient education guide, fact sheet on infection prevention and patient information card has been provided to patient /or left at bedside. PICC inserted by Claretha Cooper RN    PICC/Midline Placement Documentation  PICC Double Lumen 07/22/17 PICC Right Brachial 41 cm 0 cm (Active)       Aldona Lento L 07/22/2017, 12:58 PM

## 2017-07-22 NOTE — Progress Notes (Signed)
54ml of versed gtt wasted by Juanita Craver RN and Rick Duff Rn.

## 2017-07-22 NOTE — Progress Notes (Signed)
Upon further assessment, RN noted that patients dentures remained in his mouth. Respiratory called and assisted at bedside to remove dentures. Patient tolerated removal of dentures well, no respiratory distress noted, ETT remained in place, without movement. Dentures cleaned and placed in labeled denture cup on shelf in room, per daughter request. Will continue to monitor.

## 2017-07-22 NOTE — Progress Notes (Signed)
RT NOTE:  RT made 3 attempts to place Radial A-Line without success. Pt is normothermia protocol and cooled at time. RN aware. MD aware.

## 2017-07-22 NOTE — Progress Notes (Signed)
Elink notified of labile BPs, ranging from 953X systolic to 672 systolic. Manual BP reading 130s/80s. Order for arterial line from Dr. Nelda Marseille to better manage BP. Mg replacement also addressed. Will continue to closely monitor.

## 2017-07-22 NOTE — Procedures (Signed)
LTM-EEG Report  HISTORY: Continuous video-EEG monitoring performed for 71 year old with cardiac arrest, anoxic myoclonus.  ACQUISITION: International 10-20 system for electrode placement; 18 channels with additional eyes linked to ipsilateral ears and EKG. Additional T1-T2 electrodes were used. Continuous video recording obtained.   EEG NUMBER: MEDICATIONS:  Day 1: LEV, VPA, MDZ, propofol   DAY #1: from 0002 to 1356 07/22/17   BACKGROUND: An overall low voltage burst-suppression recording with poor spontaneous variability and reactivity. There was primarily a burst-suppression pattern consisting of periods of low voltage suppression lasting initially 1-2 minutes duration, later 5-20 seconds with adjustment in sedation. These were interrupted by bursts of high voltage, symmetric rapid spikes or high voltage theta-delta activity lasting initially 1-2 seconds, later 5-10 seconds. During portions of the recording, the bursts of rapid spikes were associated with myoclonic jerking of the head and shoulders. These were consistent with anoxic myoclonus, and these movements improved with adjusting sedation over the course of the evening.   EVENTS: myoclonic jerking as described above  EKG: no significant arrhythmia  SUMMARY: This was a markedly abnormal continuous video EEG due to a burst-suppression pattern with frequent anoxic myoclonus and high voltage spikes in bursts. There was no normal waking background seen. This was consistent with a severe anoxic injury pattern.

## 2017-07-22 DEATH — deceased

## 2017-07-23 ENCOUNTER — Inpatient Hospital Stay (HOSPITAL_COMMUNITY): Payer: PPO

## 2017-07-23 DIAGNOSIS — J9600 Acute respiratory failure, unspecified whether with hypoxia or hypercapnia: Secondary | ICD-10-CM

## 2017-07-23 DIAGNOSIS — G931 Anoxic brain damage, not elsewhere classified: Secondary | ICD-10-CM

## 2017-07-23 LAB — BASIC METABOLIC PANEL
ANION GAP: 10 (ref 5–15)
BUN: 17 mg/dL (ref 6–20)
CHLORIDE: 116 mmol/L — AB (ref 101–111)
CO2: 18 mmol/L — AB (ref 22–32)
Calcium: 7.4 mg/dL — ABNORMAL LOW (ref 8.9–10.3)
Creatinine, Ser: 1.33 mg/dL — ABNORMAL HIGH (ref 0.61–1.24)
GFR calc Af Amer: 60 mL/min — ABNORMAL LOW (ref >60)
GFR calc non Af Amer: 51 mL/min — ABNORMAL LOW (ref >60)
GLUCOSE: 99 mg/dL (ref 65–99)
POTASSIUM: 3.5 mmol/L (ref 3.5–5.1)
Sodium: 144 mmol/L (ref 135–145)

## 2017-07-23 LAB — BLOOD GAS, ARTERIAL
ACID-BASE DEFICIT: 7.5 mmol/L — AB (ref 0.0–2.0)
Bicarbonate: 16.3 mmol/L — ABNORMAL LOW (ref 20.0–28.0)
FIO2: 40
O2 SAT: 96.7 %
PATIENT TEMPERATURE: 98.6
PCO2 ART: 26.4 mmHg — AB (ref 32.0–48.0)
PEEP/CPAP: 5 cmH2O
PH ART: 7.407 (ref 7.350–7.450)
RATE: 24 resp/min
VT: 480 mL
pO2, Arterial: 92.5 mmHg (ref 83.0–108.0)

## 2017-07-23 LAB — HEPATIC FUNCTION PANEL
ALBUMIN: 2.5 g/dL — AB (ref 3.5–5.0)
ALT: 31 U/L (ref 17–63)
AST: 59 U/L — ABNORMAL HIGH (ref 15–41)
Alkaline Phosphatase: 44 U/L (ref 38–126)
Bilirubin, Direct: 0.2 mg/dL (ref 0.1–0.5)
Indirect Bilirubin: 0.5 mg/dL (ref 0.3–0.9)
Total Bilirubin: 0.7 mg/dL (ref 0.3–1.2)
Total Protein: 4.8 g/dL — ABNORMAL LOW (ref 6.5–8.1)

## 2017-07-23 LAB — GLUCOSE, CAPILLARY
GLUCOSE-CAPILLARY: 81 mg/dL (ref 65–99)
GLUCOSE-CAPILLARY: 124 mg/dL — AB (ref 65–99)
Glucose-Capillary: 124 mg/dL — ABNORMAL HIGH (ref 65–99)
Glucose-Capillary: 83 mg/dL (ref 65–99)
Glucose-Capillary: 84 mg/dL (ref 65–99)
Glucose-Capillary: 88 mg/dL (ref 65–99)

## 2017-07-23 LAB — CBC
HCT: 37.4 % — ABNORMAL LOW (ref 39.0–52.0)
HEMOGLOBIN: 11.8 g/dL — AB (ref 13.0–17.0)
MCH: 26.2 pg (ref 26.0–34.0)
MCHC: 31.6 g/dL (ref 30.0–36.0)
MCV: 83.1 fL (ref 78.0–100.0)
Platelets: 169 10*3/uL (ref 150–400)
RBC: 4.5 MIL/uL (ref 4.22–5.81)
RDW: 16.6 % — ABNORMAL HIGH (ref 11.5–15.5)
WBC: 13.7 10*3/uL — AB (ref 4.0–10.5)

## 2017-07-23 LAB — PROTIME-INR
INR: 1.17
Prothrombin Time: 15 seconds (ref 11.4–15.2)

## 2017-07-23 LAB — MAGNESIUM
MAGNESIUM: 2.5 mg/dL — AB (ref 1.7–2.4)
Magnesium: 2 mg/dL (ref 1.7–2.4)
Magnesium: 1.6 mg/dL — ABNORMAL LOW (ref 1.7–2.4)

## 2017-07-23 LAB — PHOSPHORUS
PHOSPHORUS: 3.7 mg/dL (ref 2.5–4.6)
Phosphorus: 3 mg/dL (ref 2.5–4.6)
Phosphorus: 3.8 mg/dL (ref 2.5–4.6)

## 2017-07-23 LAB — PROCALCITONIN: Procalcitonin: 0.63 ng/mL

## 2017-07-23 LAB — TROPONIN I: Troponin I: 0.17 ng/mL (ref <0.03)

## 2017-07-23 LAB — LACTIC ACID, PLASMA: LACTIC ACID, VENOUS: 4.4 mmol/L — AB (ref 0.5–1.9)

## 2017-07-23 LAB — HIV ANTIBODY (ROUTINE TESTING W REFLEX): HIV Screen 4th Generation wRfx: NONREACTIVE

## 2017-07-23 MED ORDER — VITAL AF 1.2 CAL PO LIQD
1000.0000 mL | ORAL | Status: DC
  Administered 2017-07-23 – 2017-07-24 (×2): 1000 mL

## 2017-07-23 MED ORDER — NICARDIPINE HCL IN NACL 40-0.83 MG/200ML-% IV SOLN
3.0000 mg/h | INTRAVENOUS | Status: DC
  Administered 2017-07-23 (×2): 6 mg/h via INTRAVENOUS
  Administered 2017-07-23: 10 mg/h via INTRAVENOUS
  Administered 2017-07-24 (×2): 15 mg/h via INTRAVENOUS
  Administered 2017-07-24: 10 mg/h via INTRAVENOUS
  Filled 2017-07-23 (×6): qty 200

## 2017-07-23 MED ORDER — MAGNESIUM SULFATE 2 GM/50ML IV SOLN
2.0000 g | Freq: Once | INTRAVENOUS | Status: AC
  Administered 2017-07-23: 2 g via INTRAVENOUS
  Filled 2017-07-23: qty 50

## 2017-07-23 MED ORDER — HYDRALAZINE HCL 20 MG/ML IJ SOLN: 20.0000 mg | Freq: Four times a day (QID) | INTRAMUSCULAR | Status: DC | PRN

## 2017-07-23 MED ORDER — PRO-STAT SUGAR FREE PO LIQD
30.0000 mL | Freq: Every day | ORAL | Status: DC
  Administered 2017-07-23 – 2017-07-24 (×2): 30 mL
  Filled 2017-07-23 (×2): qty 30

## 2017-07-23 NOTE — Progress Notes (Signed)
eLink Physician-Brief Progress Note Patient Name: Richard Hopkins DOB: [birth year] MRN: [id number]   Date of Service  07/23/2017  HPI/Events of Note  S/P arrest w/ defib. Hypothermia protocol. Magnesium 1.6.  eICU Interventions  Mg Sulfate 2gm IV now     Intervention Category Intermediate Interventions: Electrolyte abnormality - evaluation and management  Tera Partridge 07/23/2017, 3:47 PM

## 2017-07-23 NOTE — Progress Notes (Signed)
Initial Nutrition Assessment  DOCUMENTATION CODES:  Not applicable  INTERVENTION:  - Initiate TF of Vital 1.2 at 65 mL/hr (1560 mL/d) - 30 mL Pro-stat daily - TF Regimen provides: 1972 calories, 132 grams protein, and 1264 mL free water - TF with Propofol at a rate of 5.7 mL/hr provides: 2122 calories, 132 grams protein, and 1264 mL free water  NUTRITION DIAGNOSIS:   Inadequate oral intake related to inability to eat as evidenced by NPO status  GOAL:   Patient will meet greater than or equal to 90% of their needs  MONITOR:   Vent status, TF tolerance, I & O's, Weight trends, Labs  REASON FOR ASSESSMENT:   Consult Enteral/tube feeding initiation and management  ASSESSMENT:   Pt presented with cardiac arrest s/p fall with PMH of COPD (continues to smoke), GERD (noted esophageal stricture), DM, HTN, Low-grade glioma. Pt was intubated 7/31 to protect airway r/t seizure activity.   Pt is currently intubated on ventilator support MV: 17.2 mL/hr Discussed pt with RN. Propofol has been off this morning for EEG but has been turned back on Propofol rate: 5.7 mL/hr, providing 150 calories Will monitor for changes in propofol initiation/rate and manage the feeding accordingly.   Per PA's note, pt's EEG showed anoxic injury but has shown some improvement and will wait 72 for final prognosis.  Spoke with pt's daughter at bedside. Per pt's daughter pt may have experienced weight loss over the past month but unsure of amount or UBW. Per pt's daughter pt lives with wife who prepares all meals.   Nutrition focused physical exam completed. Findings include no fat depletion, no muscle depletion and no edema.   Labs and medications reviewed  Diet Order:     Skin:  Reviewed, no issues  Last BM:  Unknown  Height:   Ht Readings from Last 1 Encounters:  07/23/17 6\' 3"  (1.905 m)    Weight:   Wt Readings from Last 1 Encounters:  07/23/17 204 lb (92.5 kg)    Ideal Body Weight:  89  kg  BMI:  Body mass index is 25.5 kg/m.  Estimated Nutritional Needs:   Kcal:  2190   Protein:  110-130 grams  Fluid:  >/= 2 L/d  EDUCATION NEEDS:   Education needs no appropriate at this time  Parks Ranger, RDE 07/23/2017 3:44 PM

## 2017-07-23 NOTE — Progress Notes (Signed)
vLTM EEG complete. No skin breakdown 

## 2017-07-23 NOTE — Care Management Note (Signed)
Case Management Note  Patient Details  Name: Richard Hopkins MRN: [id number] Date of Birth: 01-Nov-1946  Subjective/Objective:     Pt admitted post being found down by family - suffered Cardiac Arrest               Action/Plan:  PTA independent from home - daughter at bedside.  Pt is now ventilated on the normothermia protocol.  CM will continue to follow for discharge needs   Expected Discharge Date:                  Expected Discharge Plan:     In-House Referral:     Discharge planning Services  CM Consult  Post Acute Care Choice:    Choice offered to:     DME Arranged:    DME Agency:     HH Arranged:    HH Agency:     Status of Service:     If discussed at [employer], dates discussed:    Additional Comments:  Maryclare Labrador, RN 07/23/2017, 2:52 PM

## 2017-07-23 NOTE — Progress Notes (Signed)
Subjective: Now off propofol and breathing over the vent at 26 RPM  Exam: Vitals:   07/23/17 0835 07/23/17 0900  BP:    Pulse: (!) 104 (!) 110  Resp: (!) 24 (!) 24  Temp:  98.4 F (36.9 C)    HEENT-  Normocephalic, no lesions, without obvious abnormality.  Normal external eye and conjunctiva.  Normal TM's bilaterally.  Normal auditory canals and external ears. Normal external nose, mucus membranes and septum.  Normal pharynx.    Neuro:  Mental Status: Patient does not respond to verbal stimuli.  Does not respond to deep sternal rub.  Does not follow commands.  No verbalizations noted.  Cranial Nerves: II: patient does not respond confrontation bilaterally, pupils right 2 mm, left 2 mm,and reactive bilaterally III,IV,VI: doll's response absent bilaterally.  V,VII: corneal reflex present bilaterally  VIII: patient does not respond to verbal stimuli IX,X: gag reflex present, XI: trapezius strength unable to test bilaterally XII: tongue strength unable to test Motor: Extremities flaccid throughout.  No spontaneous movement noted.  No purposeful movements noted. Sensory: Does not respond to noxious stimuli in any extremity. Deep Tendon Reflexes:  Absent throughout. Plantars: equivocal bilaterally Cerebellar: Unable to perform       Pertinent Labs/Diagnostics: LTM:  SUMMARY: This was a markedly abnormal continuous video EEG due to initially a burst-suppression pattern with anoxic myoclonus. Over the course of the recording, and generalized periodic pattern emerged with some apparent reactivity. This record was overall consistent with an anoxic injury pattern, however the past 12 hours has shown some mild improvement with emergence of reactivity.   Etta Quill PA-C [facility] [phone]  Impression: Cardiac arrest with Total downtime of 29 minutes. Patient was in status epilepticus which has now been resolved. Latest EEG shows overall consistent with anoxic  injury pattern. No further mild clonus noted. Patient was rewarmed on 07/23/2017 at 0900 hrs. current exam shows positive pupils, positive corneals, positive gag, breathing over the vent, negative doll's, does not respond to verbal or noxious stimuli. At this time prognosis remains guarded secondary to the need for 72 hours prior to formal prognostication.    07/23/2017, 9:54 AM  NEUROHOSPITALIST ADDENDUM Seen and examined the patient this afternoon Patient on propofol at time of examination. Pupils 42mm and sluggish, absent oculocephalic, corneal and gag reflex. Patient does not withdraw to painful stimuli and no spontaneous movements are seen. EEG showed  myoclonic status which is consistent with a very poor prognosis and over time a GPED pattern has emerged. Family has been made aware of the poor prognosis. I attempted to meet the family and talk to them today however they were not at bedside. Will attempt to speak to them tomorrow regarding goals of care.   Karena Addison Aroor MD [facility] [phone]  If 7pm to 7am, please call on call as listed on AMION.

## 2017-07-23 NOTE — Progress Notes (Signed)
PULMONARY / CRITICAL CARE MEDICINE   Name: Richard Hopkins MRN: [id number] DOB: 1946/05/16    ADMISSION DATE:  06/26/2017 CONSULTATION DATE:  07/02/2017  REFERRING MD:  Dr. 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 Patient presented to ED intubated, while in ED had seizure activity. CT Head negative. Neurology consulted. PCCM asked to admit.   Family reports that last few weeks patient has been complaining of increased shortness of breath and cough with yellow sputum production.    SUBJECTIVE:  Myoclonus improved over the last 24 hours Continuous EEG running, markedly abnormal now with a generalized periodic pattern and some apparent reactivity  VITAL SIGNS: BP 96/67 (BP Location: Left Leg)   Pulse (!) 110   Temp 98.4 F (36.9 C) (Core (Comment))   Resp (!) 24   Ht 6\' 3"  (1.905 m)   SpO2 99%   HEMODYNAMICS:    VENTILATOR SETTINGS: Vent Mode: PRVC FiO2 (%):  [40 %] 40 % Set Rate:  [24 bmp] 24 bmp Vt Set:  [588 mL] 680 mL PEEP:  [5 cmH20] 5 cmH20 Plateau Pressure:  [19 cmH20-20 cmH20] 20 cmH20  INTAKE / OUTPUT: I/O last 3 completed shifts: In: 5750.3 [I.V.:4340.3; IV Piggyback:1410] Out: 3160 [Urine:3160]  PHYSICAL EXAMINATION: General:  Intubated, ill-appearing, unresponsive Neuro:  Eyes open, no other responses, no purposeful movement, will not follow commands (propofol 20) HEENT:  ET tube in place, G-tube in place, EEG leads, pupils 2-3 mm and  nonreactive Cardiovascular:  Regular, borderline tachycardic, no murmur, no edema Lungs:  Decreased bilateral breath sounds, no wheezing, no crackles Abdomen:  Soft, slightly distended, positive bowel sounds Musculoskeletal:  No deformity Skin:  No rash  LABS:  BMET  Recent Labs Lab 07/22/17 0223 07/22/17 1324 07/23/17 0350  NA 140 141 144  K 4.1 3.5 3.5  CL 109 112* 116*  CO2 18* 20* 18*  BUN 17 18 17   CREATININE 1.62* 1.39* 1.33*  GLUCOSE 94 291* 99    Electrolytes  Recent Labs Lab 07/22/17 0223 07/22/17 1324 07/23/17 0350  CALCIUM 7.6* 7.4* 7.4*  MG  --  1.5* 2.0  PHOS  --  3.7  --     CBC  Recent Labs Lab 07/19/2017 2040 07/22/17 1324 07/23/17 0350  WBC 20.8* 11.8* 13.7*  HGB 12.9* 12.1* 11.8*  HCT 43.7 38.8* 37.4*  PLT 243 170 169    Coag's  Recent Labs Lab 06/21/2017 2040 07/22/17 0223 07/23/17 0350  APTT  --  30  --   INR 1.28 1.17 1.17    Sepsis Markers  Recent Labs Lab 07/03/2017 2047 06/24/2017 2246 07/22/17 0223 07/23/17 0350  LATICACIDVEN 13.43* 5.7*  --  4.4*  PROCALCITON  --   --  0.22 0.63    ABG  Recent Labs Lab 07/20/2017 2159 07/23/17 0410  PHART 7.193* 7.407  PCO2ART 50.3* 26.4*  PO2ART 434.0* 92.5    Liver Enzymes  Recent Labs Lab 07/20/2017 2040 07/23/17 0350  AST  47* 59*  ALT 30 31  ALKPHOS 52 44  BILITOT 0.6 0.7  ALBUMIN 2.8* 2.5*    Cardiac Enzymes  Recent Labs Lab 07/23/17 0119  TROPONINI 0.17*    Glucose  Recent Labs Lab 07/22/17 1317 07/22/17 1618 07/22/17 1931 07/22/17 2351 07/23/17 0353 07/23/17 0806  GLUCAP 70 92 97 85 84 83    Imaging Dg Chest Port 1 View  Result Date: 07/23/2017 CLINICAL DATA:  Respiratory failure. EXAM: PORTABLE CHEST 1 VIEW COMPARISON:  07/22/2017.  Seven chest CT 07/08/2017 . FINDINGS: Interim placement of right PICC line, its tip is projected over the superior vena cava. Endotracheal tube and NG tube in stable position. Bibasilar atelectasis. Stable  elevation left hemidiaphragm. No prominent pleural effusion. Small left pleural effusion cannot be excluded. No pneumothorax. Rib fractures best demonstrated by prior CT. Heart size normal. IMPRESSION: 1. Interim placement of right PICC line, its tip is projected over the superior vena cava. Endotracheal tube and NG tube in stable position. 2. Bibasilar atelectasis and/or scarring again noted. Stable elevation left hemidiaphragm. Small left pleural effusion cannot be excluded. 3.  Rib fractures are again best demonstrated by prior CT. Electronically Signed   By: Marcello Moores  Register   On: 07/23/2017 06:51     STUDIES:  CXR 7/31 > Endotracheal tube above the carina. Enteric tube side port is in the distal esophagus and tip in the proximal stomach. Recommend advancing the tube further into the stomach. No acute cardiopulmonary process CT Head 7/31 > no acute CT Chest/A/P 7/31 >> minimal patchy opacity left upper lobe, bilateral dependent atelectasis, minimally displaced right anterior sixth rib fracture, nondisplaced left anterior fifth rib fracture, multiple anterior posterior cortex rib fracture deformities, diffuse esophageal wall thickening Continuous EEG 8/2 >> initially with a burst suppression pattern consistent with an anoxic myoclonus. Evolving generalized periodic pattern with some apparent reactivity, question some degree of improvement TTE 8/1 >> normal LV size with some mild LVH, LVEF 55-60%. Evidence for grade 1 diastolic dysfunction. Normal RV size and function. PA pressure not estimated Lower extremity Dopplers 8/1 >> no evidence DVT   CULTURES: Blood 7/31 > Sputum 7/31 > UA 7/31 >> negative  ANTIBIOTICS: Vancomycin 7/31 >> Zosyn 7/31 >>   SIGNIFICANT EVENTS: 7/31 > ED post-cardiac arrest   LINES/TUBES: ETT 7/31 >> OG 7/31 >>   DISCUSSION: 71 year old male presents to ED post-cardiac arrest. Found down at home in bathroom unresponsive, 9 minutes before return of ROSC (unknown  before EMS arrived if pulseless, but estimate 20 minutes)   ASSESSMENT / PLAN:  PULMONARY A: Acute on Chronic Hypoxic Respiratory Distress  -family reports recent dyspnea and productive sputum production  H/O Reported COPD > CT chest ?pulmonary fibrosis, Smoker, pulmonary nodules  -on 10 mg prednisone daily  P:   Continue current ventilator support, 8cc/kg Follow for neurological improvement and ability to progress to spotting his breathing trials. Does not have the mental status currently for extubation Follow chest x-ray intermittently VAP prevention orders Scheduled bronchodilators   CARDIOVASCULAR A:  Asystole/PEA Cardiac Arrest secondary to ?hypoxia, possible other acute event consider VTE G1DD HF (EF 40-45) - April 2017 Cath EF 55%  H/O HLD, HTN Shock, cardiogenic  P:  Normothermia protocol KVO IV fluids Nicardipine for blood pressure control Hydralazine as needed  RENAL A:   Lactic Acidosis secondary to seizure vs infection vs medication effect; clearing Acute renal failure, hypoperfusion ATN, improving P:   Follow BMP, urine output Ensure adequate renal perfusion, avoid nephrotoxins  GASTROINTESTINAL A:   GERD Esophageal stricture, thickening noted on CT scan P:   Initiate tube feeding per nutrition recommendations PPI   HEMATOLOGIC A:   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 With unknown etiology  -Recent reported yellow-sputum production, cxr clear  Leukocytosis  P:   Has been treated with empiric anabiotic since presentation. Plan to stop vancomycin on 8/2, continue Zosyn for now. Consider narrowing further on 8/3 Follow culture data   ENDOCRINE A:   DM   P:   CBG and sliding scale insulin per protocol Hold  metformin   NEUROLOGIC A:   Status Epilepticus, postanoxic event. Question global ischemia versus a seizure focus, history glioma Metabolic vs Anoxic Encephalopathy  H/O Left Temporal lobe mass, glioma  H/O Lumbar vertebral fracture, degenerative disc disease s/p multiple laminectomies  P:   RASS goal: -1 Appreciate neurology assistance Depakote, Keppra as ordered Wean propofol as able Continue discussions with family regarding overall neurological improvement and prognosis Continuous EEG to stop on 8/2  FAMILY  - Updates: Discuss patient's status, possible neurological injury, prognosis with his daughter at bedside 8/2  - Inter-disciplinary family meet or Palliative Care meeting due by:  07/28/2017   Independent CC time 45 minutes  Baltazar Apo, MD, PhD 07/23/2017, 10:00 AM [facility] [phone] or if no answer [phone]

## 2017-07-23 NOTE — Procedures (Signed)
LTM-EEG Report  HISTORY: Continuous video-EEG monitoring performed for 71 year old with cardiac arrest, anoxic myoclonus.  ACQUISITION: International 10-20 system for electrode placement; 18 channels with additional eyes linked to ipsilateral ears and EKG. Additional T1-T2 electrodes were used. Continuous video recording obtained.   EEG NUMBER: MEDICATIONS:  Day 1: LEV, VPA, MDZ, propofol  Day 2: LEV, VPA, MDZ, propofol  DAY #1: from 0002 to 1356 07/22/17  BACKGROUND: An overall low voltage burst-suppression recording with poor spontaneous variability and reactivity. There was primarily a burst-suppression pattern consisting of periods of low voltage suppression lasting initially 1-2 minutes duration, later 5-20 seconds with adjustment in sedation. These were interrupted by bursts of high voltage, symmetric rapid spikes or high voltage theta-delta activity lasting initially 1-2 seconds, later 5-10 seconds. During portions of the recording, the bursts of rapid spikes were associated with myoclonic jerking of the head and shoulders. These were consistent with anoxic myoclonus, and these movements improved with adjusting sedation over the course of the evening.  EVENTS: myoclonic jerking as described above  DAY #2: from 1356 07/22/17 to 0730 07/23/17  BACKGROUND: This was initially a medium voltage burst-suppression recording with poor spontaneous variability and reactivity. The bursts consisted of medium voltage, irregular theta-delta activity bilaterally lasting 5-10 seconds. Periods of low voltage suppression interrupted these bursts and lasted 1-5 seconds. Some high voltage spike bursts were seen briefly with associated myoclonic jerking. Over the course of the evening, there was transition to a generalized periodic discharge (GPD) pattern with frequency 0.5-1Hz  blunted or triphasic-appearing sharp waves and very low voltage suppression in between GPDs. Some reactivity was present with an increase in  frequency and some improvement in GPDs with stimulation/arousal.  EVENTS: none  EKG: no significant arrhythmia  SUMMARY: This was a markedly abnormal continuous video EEG due to initially a burst-suppression pattern with anoxic myoclonus. Over the course of the recording, and generalized periodic pattern emerged with some apparent reactivity. This record was overall consistent with an anoxic injury pattern, however the past 12 hours has shown some mild improvement with emergence of reactivity.

## 2017-07-24 ENCOUNTER — Encounter: Payer: Self-pay | Admitting: Family Medicine

## 2017-07-24 ENCOUNTER — Inpatient Hospital Stay (HOSPITAL_COMMUNITY): Payer: PPO

## 2017-07-24 DIAGNOSIS — J9601 Acute respiratory failure with hypoxia: Secondary | ICD-10-CM

## 2017-07-24 DIAGNOSIS — S069X6D Unspecified intracranial injury with loss of consciousness greater than 24 hours without return to pre-existing conscious level with patient surviving, subsequent encounter: Secondary | ICD-10-CM

## 2017-07-24 LAB — GLUCOSE, CAPILLARY
GLUCOSE-CAPILLARY: 132 mg/dL — AB (ref 65–99)
GLUCOSE-CAPILLARY: 141 mg/dL — AB (ref 65–99)
GLUCOSE-CAPILLARY: 146 mg/dL — AB (ref 65–99)
Glucose-Capillary: 105 mg/dL — ABNORMAL HIGH (ref 65–99)

## 2017-07-24 LAB — BASIC METABOLIC PANEL
ANION GAP: 6 (ref 5–15)
BUN: 17 mg/dL (ref 6–20)
CALCIUM: 7.7 mg/dL — AB (ref 8.9–10.3)
CHLORIDE: 117 mmol/L — AB (ref 101–111)
CO2: 20 mmol/L — AB (ref 22–32)
Creatinine, Ser: 1.2 mg/dL (ref 0.61–1.24)
GFR calc Af Amer: >60 mL/min (ref >60)
GFR, EST NON AFRICAN AMERICAN: 58 mL/min — AB (ref >60)
GLUCOSE: 138 mg/dL — AB (ref 65–99)
POTASSIUM: 4 mmol/L (ref 3.5–5.1)
Sodium: 143 mmol/L (ref 135–145)

## 2017-07-24 LAB — CBC
HCT: 37.9 % — ABNORMAL LOW (ref 39.0–52.0)
Hemoglobin: 11.7 g/dL — ABNORMAL LOW (ref 13.0–17.0)
MCH: 25.8 pg — AB (ref 26.0–34.0)
MCHC: 30.9 g/dL (ref 30.0–36.0)
MCV: 83.5 fL (ref 78.0–100.0)
Platelets: 140 10*3/uL — ABNORMAL LOW (ref 150–400)
RBC: 4.54 MIL/uL (ref 4.22–5.81)
RDW: 17 % — ABNORMAL HIGH (ref 11.5–15.5)
WBC: 11 10*3/uL — AB (ref 4.0–10.5)

## 2017-07-24 LAB — PHOSPHORUS
PHOSPHORUS: 2.7 mg/dL (ref 2.5–4.6)
PHOSPHORUS: 3 mg/dL (ref 2.5–4.6)

## 2017-07-24 LAB — MAGNESIUM
MAGNESIUM: 2.3 mg/dL (ref 1.7–2.4)
Magnesium: 2.1 mg/dL (ref 1.7–2.4)

## 2017-07-24 MED ORDER — PROPOFOL 500 MG/50ML IV EMUL
INTRAVENOUS | Status: AC
  Administered 2017-07-24: 30 ug/kg/min
  Filled 2017-07-24: qty 50

## 2017-07-24 MED ORDER — SODIUM CHLORIDE 0.9 % IV SOLN
10.0000 mg/h | INTRAVENOUS | Status: DC
  Administered 2017-07-24: 10 mg/h via INTRAVENOUS
  Filled 2017-07-24: qty 10

## 2017-07-24 MED ORDER — MORPHINE BOLUS VIA INFUSION
5.0000 mg | INTRAVENOUS | Status: DC | PRN
  Filled 2017-07-24: qty 20

## 2017-07-27 LAB — CULTURE, BLOOD (ROUTINE X 2)
CULTURE: NO GROWTH
Culture: NO GROWTH
SPECIAL REQUESTS: ADEQUATE
SPECIAL REQUESTS: ADEQUATE

## 2017-07-31 ENCOUNTER — Telehealth: Payer: Self-pay

## 2017-07-31 NOTE — Telephone Encounter (Signed)
On 08/05/2017 I received a death certificate from [facility] (original). The death certificate is for burial. The patient is a patient of Doctor Byrum. The death certificate will be taken to [facility]) on this pm for signature.  On 08-28-2017 I received the death certificate back from Doctor Byrum. I got the death certificate ready and called the funeral home to let them know the death certificate is ready for pickup.

## 2017-08-05 ENCOUNTER — Other Ambulatory Visit: Payer: Self-pay | Admitting: Nurse Practitioner

## 2017-08-14 NOTE — Discharge Summary (Signed)
PULMONARY / CRITICAL CARE MEDICINE   Name: Richard Hopkins MRN: [id number] DOB: Oct 13, 1946    ADMISSION DATE:  2017-07-31 CONSULTATION DATE:  31-Jul-2017 DATE OF DEATH: 08-04-2017  FINAL CAUSE OF DEATH Anoxic brain injury and encephalopathy  SECONDARY CAUSES OF DEATH Cardiac arrest / PEA Acute respiratory failure with hypoxemia Cardiogenic shock Possible co-existing septic shock Possible HCAP Chronic HFpEF Hypertension Hyperlipidemia COPD Seizures / status epilepticus Acute renal failure Acute metabolic acidosis, lactic acidosis Esophageal stricture GERD Factor VIII inhibitor with coagulopathy LE edema Diabetes Hx L temporal lobe mass, glioma DJD  Baltazar Apo, MD, PhD 08/14/2017, 1:05 PM [facility] [phone] or if no answer [phone]

## 2017-08-17 ENCOUNTER — Ambulatory Visit: Payer: PPO | Admitting: Gastroenterology

## 2017-08-22 NOTE — Procedures (Signed)
Extubation Procedure Note  Patient Details:   Name: Richard Hopkins DOB: [birth year] MRN: [id number]   Airway Documentation: Patient terminally extubated per family request, MD order.  Evaluation  O2 sats: currently acceptable Complications: No apparent complications Patient did tolerate procedure well. Bilateral Breath Sounds: Rhonchi, Diminished   No  Athea Haley, Elwyn Lade 08/19/17, 8:47 PM

## 2017-08-22 NOTE — Progress Notes (Signed)
PULMONARY / CRITICAL CARE MEDICINE   Name: Richard Hopkins MRN: [id number] DOB: 11-Apr-1946    ADMISSION DATE:  06/29/2017 CONSULTATION DATE:  07/15/2017  REFERRING MD:  Dr. 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 Patient presented to ED intubated, while in ED had seizure activity. CT Head negative. Neurology consulted. PCCM asked to admit.   Family reports that last few weeks patient has been complaining of increased shortness of breath and cough with yellow sputum production.    SUBJECTIVE:  Propofol has been titrated up to 65, apparently due to increased myoclonus Tube feeding recommendations made Appreciate neurology input  VITAL SIGNS: BP (!) 79/57   Pulse 86   Temp 98.6 F (37 C) (Core (Comment))   Resp (!) 24   Ht 6\' 3"  (1.905 m) Comment: 07/09/17 office visit  Wt 101.6 kg (223 lb 15.8 oz)   SpO2 99%   BMI 28.00 kg/m   HEMODYNAMICS:    VENTILATOR SETTINGS: Vent Mode: PRVC FiO2 (%):  [40 %-60 %] 40 % Set Rate:  [24 bmp] 24 bmp Vt Set:  [680 mL] 680 mL PEEP:  [5 cmH20] 5 cmH20 Plateau Pressure:  [22 cmH20-25 cmH20] 22 cmH20  INTAKE / OUTPUT: I/O last 3 completed shifts: In: 95 [I.V.:3554; NG/GT:910; IV Piggyback:1165] Out: 3185 [Urine:3185]  PHYSICAL EXAMINATION: General: Intubated, ill-appearing, unresponsive Neuro:  No meaningful interaction today, eyes closed, no reported purposeful movement (propofol 65) HEENT:  ET tube in place,  gastric tube in place, pupils 3 mm and do not react; c-collar in place Cardiovascular:  Regular, distant, no murmur Lungs:  Decreased bilateral breath sounds, no wheezing, no crackles Abdomen:  Soft, slightly distended, positive bowel sounds Musculoskeletal:  No deformities Skin:  No rashes  LABS:  BMET  Recent Labs Lab 07/22/17 1324 07/23/17 0350 08/17/2017 0429  NA 141 144 143  K 3.5 3.5 4.0  CL 112* 116* 117*  CO2 20* 18* 20*  BUN 18 17 17   CREATININE 1.39* 1.33* 1.20  GLUCOSE 291* 99 138*    Electrolytes  Recent Labs Lab 07/22/17 1324 07/23/17 0350 07/23/17 1336 07/23/17 1909 07/23/17 2058 August 17, 2017 0429  CALCIUM 7.4* 7.4*  --   --   --  7.7*  MG 1.5* 2.0 1.6*  --  2.5* 2.3  PHOS 3.7  --  3.0 3.7 3.8 2.7    CBC  Recent Labs Lab 07/22/17 1324 07/23/17 0350 08/17/2017 0429  WBC 11.8* 13.7* 11.0*  HGB 12.1* 11.8* 11.7*  HCT 38.8* 37.4* 37.9*  PLT 170 169 140*    Coag's  Recent Labs Lab 07/20/2017 2040 07/22/17 0223 07/23/17 0350  APTT  --  30  --   INR 1.28 1.17 1.17    Sepsis Markers  Recent Labs Lab 07/20/2017 2047 07/16/2017 2246 07/22/17 0223 07/23/17 0350  LATICACIDVEN 13.43* 5.7*  --  4.4*  PROCALCITON  --   --  0.22 0.63    ABG  Recent Labs Lab 06/23/2017 2159  07/23/17 0410  PHART 7.193* 7.407  PCO2ART 50.3* 26.4*  PO2ART 434.0* 92.5    Liver Enzymes  Recent Labs Lab 06/22/2017 2040 07/23/17 0350  AST 47* 59*  ALT 30 31  ALKPHOS 52 44  BILITOT 0.6 0.7  ALBUMIN 2.8* 2.5*    Cardiac Enzymes  Recent Labs Lab 07/23/17 0119  TROPONINI 0.17*    Glucose  Recent Labs Lab 07/23/17 0806 07/23/17 1239 07/23/17 1613 07/23/17 1942 07/23/17 2342 07/30/2017 0359  GLUCAP 83 81 88 124* 124* 132*    Imaging Dg Chest Port 1 View  Result Date: 07/30/2017 CLINICAL DATA:  Hypoxia EXAM: PORTABLE CHEST 1 VIEW COMPARISON:  July 23, 2017 FINDINGS: Endotracheal tube tip is 6.2 cm above the carina. Nasogastric tube tip and side  port below the diaphragm. Central catheter tip is in superior vena cava. No pneumothorax. There are small pleural effusions bilaterally with mild right base atelectasis. No frank edema or consolidation. Heart size within normal limits. Pulmonary vascularity is normal. There is aortic atherosclerosis. Bones appear somewhat osteoporotic. IMPRESSION: Tube and catheter positions as described without pneumothorax. Small pleural effusions bilaterally with mild right base atelectasis. No consolidation. Stable cardiac silhouette. There is aortic atherosclerosis. Aortic Atherosclerosis (ICD10-I70.0). Electronically Signed   By: Lowella Grip III M.D.   On: 30-Jul-2017 07:09     STUDIES:  CXR 7/31 > Endotracheal tube above the carina. Enteric tube side port is in the distal esophagus and tip in the proximal stomach. Recommend advancing the tube further into the stomach. No acute cardiopulmonary process CT Head 7/31 > no acute CT Chest/A/P 7/31 >> minimal patchy opacity left upper lobe, bilateral dependent atelectasis, minimally displaced right anterior sixth rib fracture, nondisplaced left anterior fifth rib fracture, multiple anterior posterior cortex rib fracture deformities, diffuse esophageal wall thickening Continuous EEG 8/2 >> initially with a burst suppression pattern consistent with an anoxic myoclonus. Evolving generalized periodic pattern with some apparent reactivity, question some degree of improvement TTE 8/1 >> normal LV size with some mild LVH, LVEF 55-60%. Evidence for grade 1 diastolic dysfunction. Normal RV size and function. PA pressure not estimated Lower extremity Dopplers 8/1 >> no evidence DVT   CULTURES: Blood 7/31 > Sputum 7/31 > UA 7/31 >> negative  ANTIBIOTICS: Vancomycin 7/31 >> 8/2 Zosyn 7/31 >>   SIGNIFICANT EVENTS: 7/31 > ED post-cardiac arrest   LINES/TUBES: ETT 7/31 >> OG 7/31 >>   DISCUSSION: 71 year old male presents to ED post-cardiac arrest. Found down at  home in bathroom unresponsive, 9 minutes before return of ROSC (unknown before EMS arrived if pulseless, but estimate 20 minutes)   ASSESSMENT / PLAN:  PULMONARY A: Acute on Chronic Hypoxic Respiratory Distress  -family reports recent dyspnea and productive sputum production  H/O Reported COPD > CT chest ?pulmonary fibrosis, Smoker, pulmonary nodules  -on 10 mg prednisone daily  P:   Continue curvilinear support, 8cc/kg. unfortunately not in a position to tolerate spontaneous breathing, not a position to consider extubation due to his neurological status. Follow intermittent chest x-ray Scheduled bronchodilators VAP prevention orders  CARDIOVASCULAR A:  Asystole/PEA Cardiac Arrest secondary to ?hypoxia, possible other acute event consider VTE G1DD HF (EF 40-45) - April 2017 Cath EF 55%  H/O HLD, HTN Shock, cardiogenic, resolved Active hypertension P:  Normothermia protocol IV fluids at [facility] Nicardipine ordered but not currently running Hydralazine as needed  RENAL A:   Lactic Acidosis secondary to seizure vs infection vs medication effect; clearing Acute renal failure, hypoperfusion ATN, improved P:  Follow BMP, urine output Replace electrolytes as indicated Ensure adequate renal perfusion, avoid nephrotoxins  GASTROINTESTINAL A:   GERD Esophageal stricture, thickening noted on CT scan P:   Tube feeding PPI   HEMATOLOGIC A:   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 With unknown etiology  -Recent reported yellow-sputum production, cxr clear  Leukocytosis  P:   Has been treated with empiric anabiotic since presentation. Plan to stop vancomycin on 8/2, continue Zosyn for now. Consider narrowing further on 8/3 Follow culture data   ENDOCRINE A:   DM   P:   Sliding scale insulin and CABG  per protocol Metformin is on hold   NEUROLOGIC A:   Status Epilepticus, postanoxic event. Question global ischemia versus a seizure focus, history glioma Metabolic vs Anoxic Encephalopathy  H/O Left Temporal lobe mass, glioma  H/O Lumbar vertebral fracture, degenerative disc disease s/p multiple laminectomies  P:   RASS goal: -1 Appreciate neurology assistance. We will need to speak with family 8/3 regarding his injury, prognosis, plans for further care Continue Depakote, Keppra Attempt to wean propofol as able, increased due to myoclonus and also tachypnea and hypertension  FAMILY  - Updates: Discuss patient's status, possible neurological injury, prognosis with his daughter at bedside 8/3. I will plan to confer with neurology and then revisit goals of care with them  - Inter-disciplinary family meet or Palliative Care meeting due by:  07/28/2017   Independent CC time 60 minutes  Baltazar Apo, MD, PhD 16-Aug-2017, 8:42 AM [facility] [phone] or if no answer [phone]

## 2017-08-22 NOTE — Progress Notes (Signed)
Subjective: The patient was restarted on propofol due to myoclonic activity. Continues to remain unresponsive.   Exam:     Vitals:   07/23/17 0835 07/23/17 0900  BP:    Pulse: (!) 104 (!) 110  Resp: (!) 24 (!) 24  Temp:  98.4 F (36.9 C)    HEENT-  Normocephalic, no lesions, without obvious abnormality.  Normal external eye and conjunctiva.  Normal TM's bilaterally.  Normal auditory canals and external ears. Normal external nose, mucus membranes and septum.  Normal pharynx.    Neuro: Mental Status: Patient does not respond to verbal stimuli.  Does not respond to deep sternal rub.  Does not follow commands.  No verbalizations noted.  Cranial Nerves: II: patient does not respond confrontation bilaterally, pupils right 3 mm, left 3 mm andnot reactive III,IV,VI: doll's response absent bilaterally.  V,VII: corneal reflex present bilaterally  VIII: patient does not respond to verbal stimuli IX,X: gag reflex present, XI: trapezius strength unable to test bilaterally XII: tongue strength unable to test Motor: Extremities flaccid throughout.  No spontaneous movement noted.  No purposeful movements noted. Sensory: Does not respond to noxious stimuli in any extremity. Deep Tendon Reflexes:  Absent throughout. Plantars: equivocal bilaterally Cerebellar: Unable to perform      Pertinent Labs/Diagnostics: LTM:  SUMMARY: This was a markedly abnormal continuous video EEG due to initiallya burst-suppression pattern with anoxic myoclonus. Over the course of the recording, and generalized periodic pattern emerged with some apparent reactivity. This record was overall consistent with an anoxic injury pattern, however the past 12 hours has shown some mild improvement with emergence of reactivity.    Impression:   Severe anoxic injury  Seen and examined the patient this afternoon Patient on propofol at time of examination. Pupils 18mm and sluggish, absent  oculocephalic, corneal and gag reflex. Patient does not withdraw to painful stimuli and no spontaneous movements are seen. EEG showed  myoclonic status which is consistent with a very poor prognosis and over time a GPED pattern has emerged. Family has been made aware of the poor prognosis.   I spoke with the family at length today and spent a total of 20 minutes counseling the patient family. They seem to understand the patient would likely not recover from his severe brain injury. I discussed goals of care including DNR status and palliative consult. The family did not seem to be ready at this point. I recommend obtaining an MRI Brain to help reassure the family regarding prognosis. I will continue to follow.  Karena Addison Donne Robillard MD [facility] [phone]  If 7pm to 7am, please call on call as listed on AMION.

## 2017-08-22 NOTE — Progress Notes (Addendum)
Patient terminally extubated per family request, MD order. Family was brought in per request to spend time with him. At 2045, patient went asystole. Verified TOD by auscultating lungs and verifying pulse with Juanda Crumble, Therapist, sports. Family, CDS, and MD was notified. Wasted 95ml of Propofol and, 230 of Morphine in sink with Ernst Bowler, Therapist, sports.

## 2017-08-22 NOTE — Progress Notes (Signed)
Chaplain introduced herself to grandson who was at bedside of patient.  Grandson having a difficult time with situation.  Chaplain will remain available for this family.  Please call if additional support or additional family members arrive.      07-29-2017 1436  Clinical Encounter Type  Visited With Family  Visit Type Initial;Spiritual support;Social support  Consult/Referral To [employer]

## 2017-08-22 NOTE — Progress Notes (Signed)
eLink Physician-Brief Progress Note Patient Name: Richard Hopkins DOB: Jul 03, 1946 MRN: [id number]   Date of Service  08/05/2017  HPI/Events of Note  I was Informed by the nurse that the family wants to withdraw care and remove life support.   Chart reviewed and I spoke over the phone with his wife Richard Hopkins and daughter Richard Hopkins.  They confirmed that after discussion with Dr. Lamonte Sakai and the neurology team earlier today they have decided that Richard Hopkins would not want to be supported like this and request comfort care.   eICU Interventions  Discussed with Dr. Lamonte Sakai  who agrees that that this is the right thing to do  Withdrawal of care orders placed.     Intervention Category Major Interventions: Other:  Richard Hopkins 2017/08/05, 6:11 PM

## 2017-08-22 DEATH — deceased

## 2017-08-27 IMAGING — DX DG CHEST 1V PORT
1 series · 1 of 1 positions shown · non-contrast
Comparison: 07/22/2017.  Seven chest CT 07/21/2017 .

CLINICAL DATA: Respiratory failure.

EXAM:
PORTABLE CHEST 1 VIEW

[chest ap]
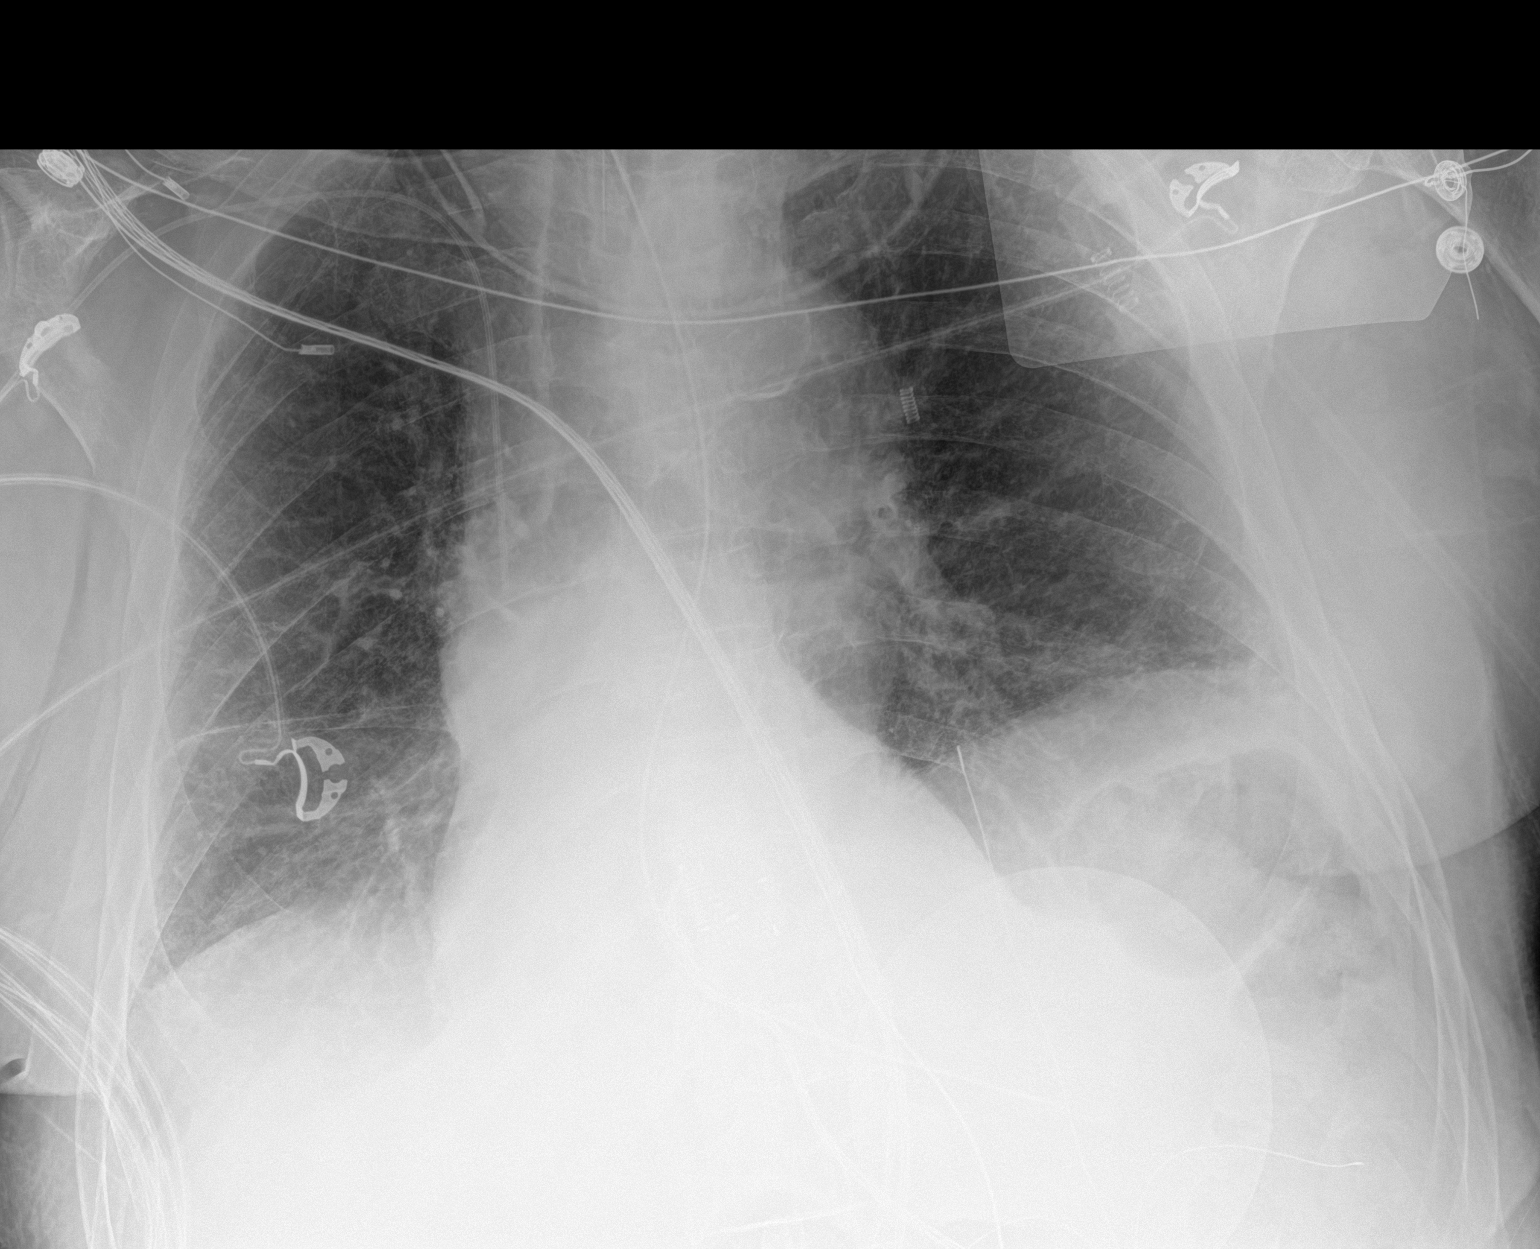

[1 of 1 positions shown; findings below may reference images not displayed]

FINDINGS: Interim placement of right PICC line, its tip is projected over the
superior vena cava. Endotracheal tube and NG tube in stable
position. Bibasilar atelectasis. Stable elevation left
hemidiaphragm. No prominent pleural effusion. Small left pleural
effusion cannot be excluded. No pneumothorax. Rib fractures best
demonstrated by prior CT. Heart size normal.
IMPRESSION: 1. Interim placement of right PICC line, its tip is projected over
the superior vena cava. Endotracheal tube and NG tube in stable
position.

2. Bibasilar atelectasis and/or scarring again noted. Stable
elevation left hemidiaphragm. Small left pleural effusion cannot be
excluded.

3.  Rib fractures are again best demonstrated by prior CT.

## 2017-09-01 ENCOUNTER — Telehealth: Payer: Self-pay

## 2017-09-01 NOTE — Telephone Encounter (Signed)
This note was entered by mistake. °

## 2017-10-27 ENCOUNTER — Encounter: Payer: PPO | Admitting: Internal Medicine

## 2017-12-03 ENCOUNTER — Ambulatory Visit: Payer: PPO | Admitting: Hematology and Oncology

## 2018-07-03 IMAGING — DX DG LUMBAR SPINE COMPLETE 4+V
5 series · 5 of 5 positions shown · non-contrast
Comparison: 01/23/2017

CLINICAL DATA: Low back pain extending to the left knee for 7 days.

EXAM:
LUMBAR SPINE - COMPLETE 4+ VIEW

[l-spine ap]
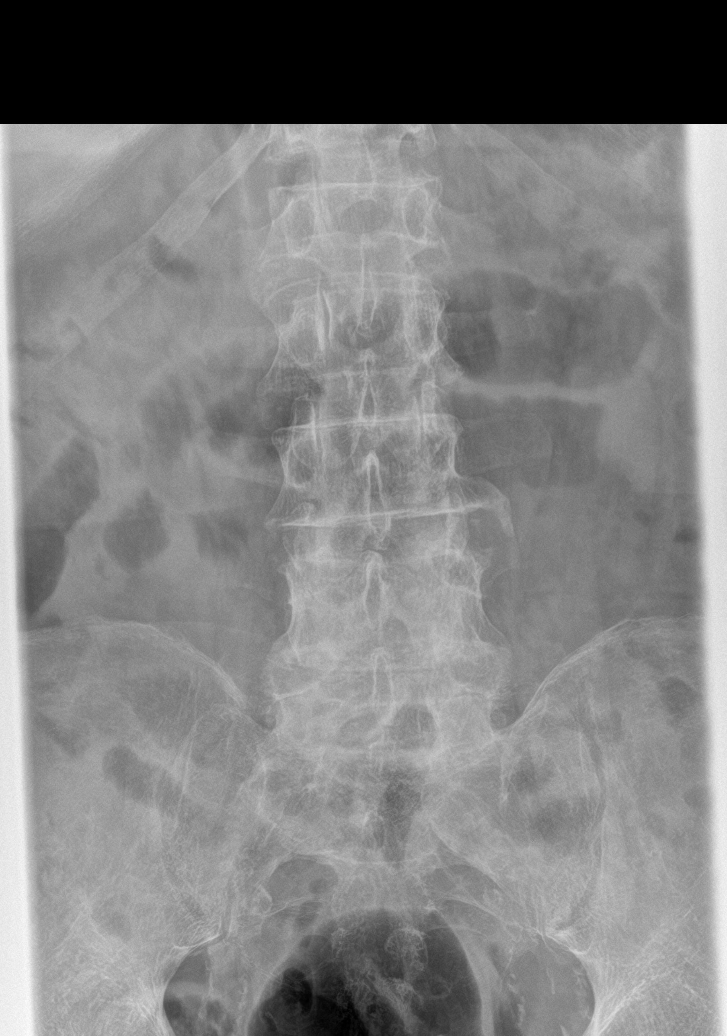

[l-spine obl (1 of 2)]
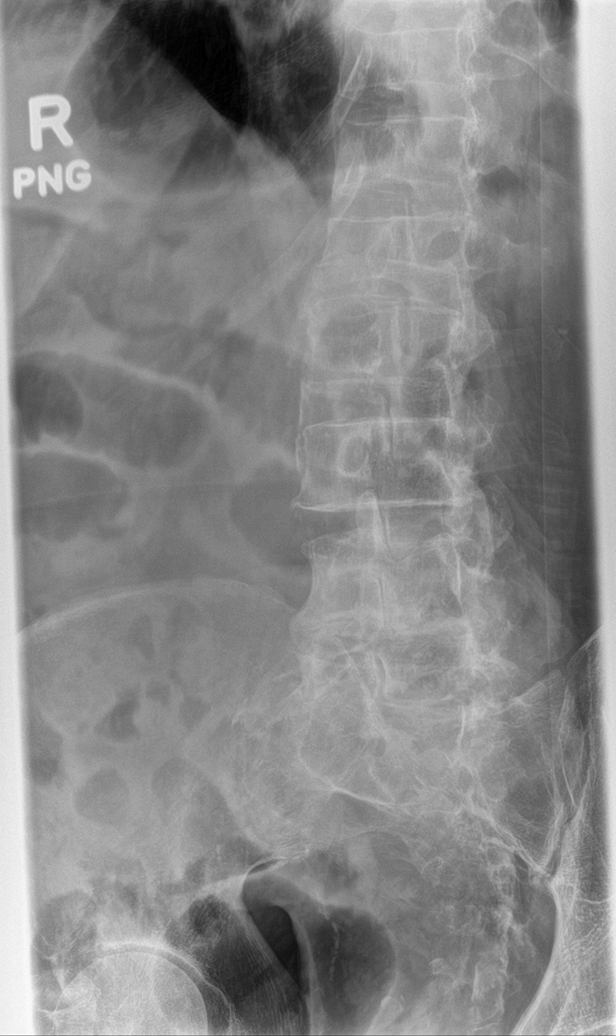

[l-spine obl (2 of 2)]
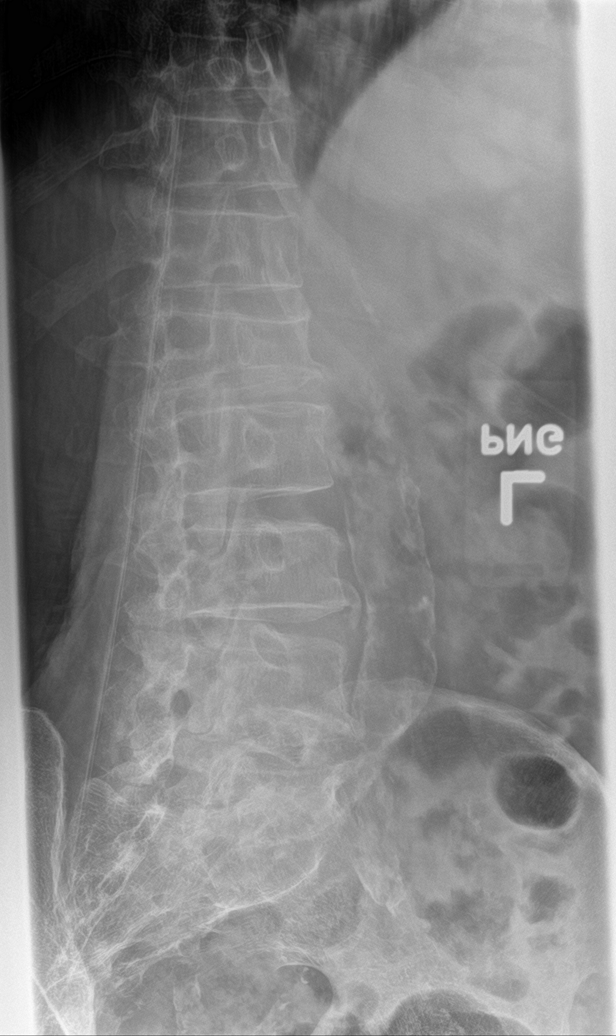

[l-spine lat]
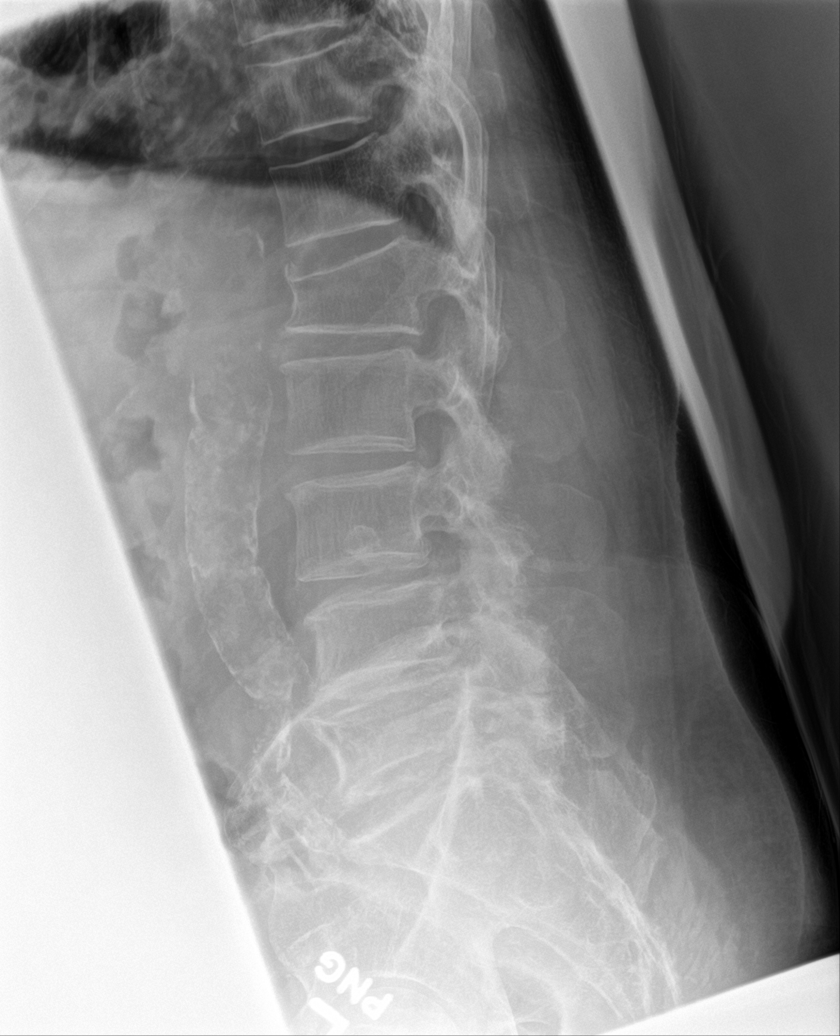

[l-spine spot]
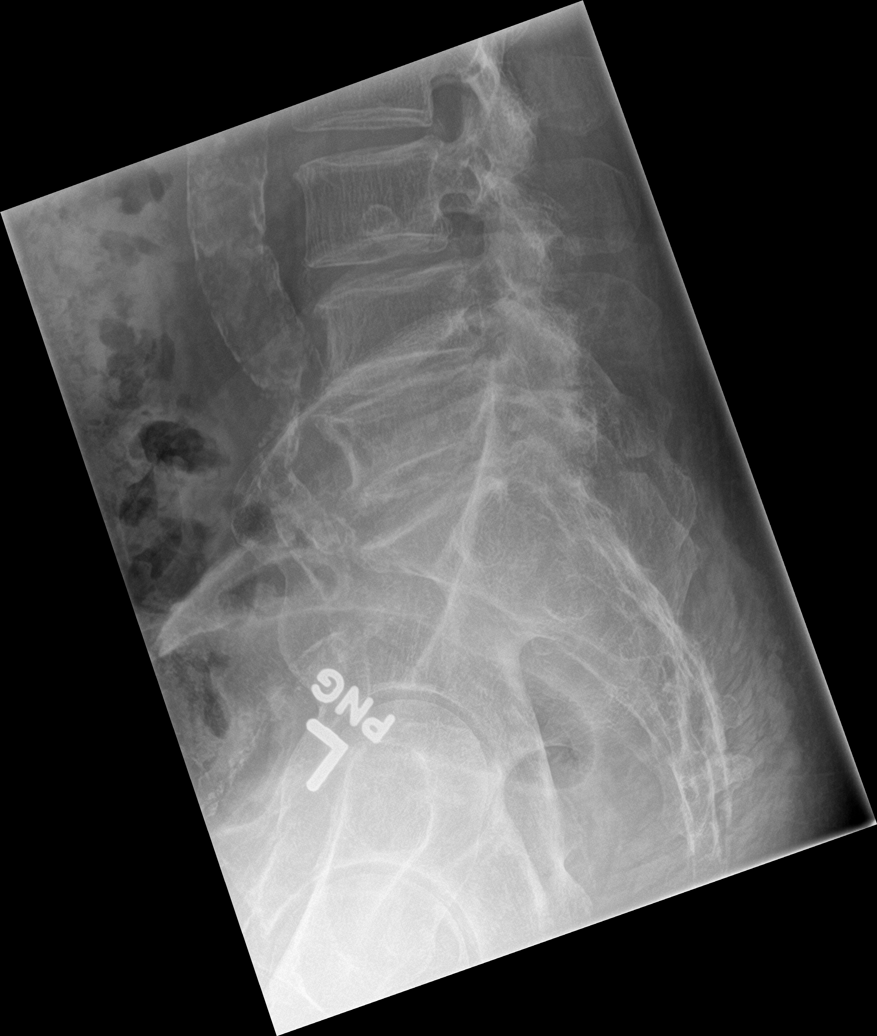

[5 of 5 positions shown; findings below may reference images not displayed]

FINDINGS: Remote L1 compression fracture. No evidence of acute fracture. No
endplate erosion or evidence of focal bone lesion.

Generalized disc narrowing, moderate at L4-5 and L5-S1 where there
is also spurring. Lower lumbar facet arthropathy with hypertrophy.

Extensive atherosclerotic calcification of the aorta.
IMPRESSION: 1. No acute finding.
2. Remote L1 compression fracture.
3. Disc degeneration and facet arthropathy, most notable at L4-5 and
L5-S1.
4.  Aortic Atherosclerosis (SYSH0-170.0)
